# Patient Record
Sex: Female | Born: 1950 | Race: Black or African American | Hispanic: No | Marital: Single | Smoking: Current some day smoker
Health system: Southern US, Community
[De-identification: ages and names within clinical notes are randomized; demographics above are authoritative.]

## PROBLEM LIST (undated history)

## (undated) DIAGNOSIS — Z950 Presence of cardiac pacemaker: Secondary | ICD-10-CM

## (undated) DIAGNOSIS — G971 Other reaction to spinal and lumbar puncture: Secondary | ICD-10-CM

## (undated) DIAGNOSIS — K219 Gastro-esophageal reflux disease without esophagitis: Secondary | ICD-10-CM

## (undated) DIAGNOSIS — R112 Nausea with vomiting, unspecified: Secondary | ICD-10-CM

## (undated) DIAGNOSIS — I2510 Atherosclerotic heart disease of native coronary artery without angina pectoris: Secondary | ICD-10-CM

## (undated) DIAGNOSIS — C50919 Malignant neoplasm of unspecified site of unspecified female breast: Secondary | ICD-10-CM

## (undated) DIAGNOSIS — R32 Unspecified urinary incontinence: Secondary | ICD-10-CM

## (undated) DIAGNOSIS — I482 Chronic atrial fibrillation, unspecified: Secondary | ICD-10-CM

## (undated) DIAGNOSIS — E876 Hypokalemia: Secondary | ICD-10-CM

## (undated) DIAGNOSIS — T884XXA Failed or difficult intubation, initial encounter: Secondary | ICD-10-CM

## (undated) DIAGNOSIS — R51 Headache: Secondary | ICD-10-CM

## (undated) DIAGNOSIS — Z930 Tracheostomy status: Secondary | ICD-10-CM

## (undated) DIAGNOSIS — Z7901 Long term (current) use of anticoagulants: Secondary | ICD-10-CM

## (undated) DIAGNOSIS — I10 Essential (primary) hypertension: Secondary | ICD-10-CM

## (undated) DIAGNOSIS — G8921 Chronic pain due to trauma: Secondary | ICD-10-CM

## (undated) DIAGNOSIS — M352 Behcet's disease: Secondary | ICD-10-CM

## (undated) DIAGNOSIS — I509 Heart failure, unspecified: Secondary | ICD-10-CM

## (undated) DIAGNOSIS — M1990 Unspecified osteoarthritis, unspecified site: Secondary | ICD-10-CM

## (undated) DIAGNOSIS — Q321 Other congenital malformations of trachea: Secondary | ICD-10-CM

## (undated) DIAGNOSIS — Z9889 Other specified postprocedural states: Secondary | ICD-10-CM

## (undated) DIAGNOSIS — Z8719 Personal history of other diseases of the digestive system: Secondary | ICD-10-CM

## (undated) DIAGNOSIS — N189 Chronic kidney disease, unspecified: Secondary | ICD-10-CM

## (undated) DIAGNOSIS — N390 Urinary tract infection, site not specified: Secondary | ICD-10-CM

## (undated) DIAGNOSIS — R519 Headache, unspecified: Secondary | ICD-10-CM

## (undated) DIAGNOSIS — Z8711 Personal history of peptic ulcer disease: Secondary | ICD-10-CM

## (undated) DIAGNOSIS — I639 Cerebral infarction, unspecified: Secondary | ICD-10-CM

## (undated) DIAGNOSIS — I219 Acute myocardial infarction, unspecified: Secondary | ICD-10-CM

## (undated) DIAGNOSIS — I729 Aneurysm of unspecified site: Secondary | ICD-10-CM

## (undated) HISTORY — DX: Hypokalemia: E87.6

## (undated) HISTORY — DX: Other congenital malformations of trachea: Q32.1

## (undated) HISTORY — DX: Essential (primary) hypertension: I10

## (undated) HISTORY — DX: Unspecified urinary incontinence: R32

## (undated) HISTORY — DX: Behcet's disease: M35.2

## (undated) HISTORY — DX: Headache: R51

## (undated) HISTORY — DX: Headache, unspecified: R51.9

## (undated) HISTORY — PX: BLADDER SURGERY: SHX569

## (undated) HISTORY — PX: UVULOPALATOPHARYNGOPLASTY: SHX827

## (undated) HISTORY — DX: Urinary tract infection, site not specified: N39.0

## (undated) HISTORY — DX: Aneurysm of unspecified site: I72.9

## (undated) HISTORY — PX: BACK SURGERY: SHX140

## (undated) HISTORY — DX: Personal history of peptic ulcer disease: Z87.11

## (undated) HISTORY — PX: CHOLECYSTECTOMY: SHX55

## (undated) HISTORY — DX: Tracheostomy status: Z93.0

## (undated) HISTORY — DX: Chronic atrial fibrillation, unspecified: I48.20

## (undated) HISTORY — DX: Chronic pain due to trauma: G89.21

## (undated) HISTORY — PX: ABDOMINAL HYSTERECTOMY: SHX81

## (undated) HISTORY — DX: Atherosclerotic heart disease of native coronary artery without angina pectoris: I25.10

## (undated) HISTORY — DX: Unspecified osteoarthritis, unspecified site: M19.90

## (undated) HISTORY — PX: FRACTURE SURGERY: SHX138

## (undated) HISTORY — DX: Personal history of other diseases of the digestive system: Z87.19

## (undated) HISTORY — DX: Long term (current) use of anticoagulants: Z79.01

---

## 1996-10-08 DIAGNOSIS — I729 Aneurysm of unspecified site: Secondary | ICD-10-CM

## 1996-10-08 HISTORY — DX: Aneurysm of unspecified site: I72.9

## 1998-10-08 HISTORY — PX: TRACHEAL SURGERY: SHX1096

## 2004-07-26 ENCOUNTER — Inpatient Hospital Stay: Payer: Self-pay | Admitting: Internal Medicine

## 2004-08-04 ENCOUNTER — Inpatient Hospital Stay: Payer: Self-pay | Admitting: Internal Medicine

## 2004-08-11 ENCOUNTER — Ambulatory Visit: Payer: Self-pay | Admitting: Internal Medicine

## 2004-09-16 ENCOUNTER — Inpatient Hospital Stay: Payer: Self-pay | Admitting: Internal Medicine

## 2004-09-23 ENCOUNTER — Ambulatory Visit: Payer: Self-pay | Admitting: Internal Medicine

## 2004-10-24 ENCOUNTER — Ambulatory Visit: Payer: Self-pay | Admitting: Internal Medicine

## 2004-12-21 ENCOUNTER — Ambulatory Visit: Payer: Self-pay | Admitting: Internal Medicine

## 2005-09-26 ENCOUNTER — Ambulatory Visit: Payer: Self-pay | Admitting: Neurology

## 2005-10-19 ENCOUNTER — Inpatient Hospital Stay: Payer: Self-pay | Admitting: Internal Medicine

## 2005-10-27 ENCOUNTER — Other Ambulatory Visit: Payer: Self-pay

## 2005-10-29 HISTORY — PX: PACEMAKER INSERTION: SHX728

## 2005-10-30 ENCOUNTER — Other Ambulatory Visit: Payer: Self-pay

## 2005-11-08 ENCOUNTER — Ambulatory Visit: Payer: Self-pay | Admitting: Internal Medicine

## 2005-11-18 ENCOUNTER — Emergency Department: Payer: Self-pay | Admitting: General Practice

## 2006-01-04 ENCOUNTER — Ambulatory Visit: Payer: Self-pay | Admitting: Otolaryngology

## 2006-07-03 ENCOUNTER — Inpatient Hospital Stay: Payer: Self-pay | Admitting: Internal Medicine

## 2008-12-10 ENCOUNTER — Emergency Department: Payer: Self-pay | Admitting: Emergency Medicine

## 2008-12-28 ENCOUNTER — Encounter: Admission: RE | Admit: 2008-12-28 | Discharge: 2008-12-28 | Payer: Self-pay | Admitting: Neurosurgery

## 2009-01-20 ENCOUNTER — Encounter: Admission: RE | Admit: 2009-01-20 | Discharge: 2009-01-20 | Payer: Self-pay | Admitting: Neurosurgery

## 2009-03-14 ENCOUNTER — Other Ambulatory Visit: Payer: Self-pay | Admitting: Internal Medicine

## 2009-03-14 ENCOUNTER — Encounter: Admission: RE | Admit: 2009-03-14 | Discharge: 2009-03-14 | Payer: Self-pay | Admitting: Neurosurgery

## 2009-03-29 ENCOUNTER — Ambulatory Visit: Payer: Self-pay

## 2009-04-16 ENCOUNTER — Inpatient Hospital Stay (HOSPITAL_COMMUNITY): Admission: RE | Admit: 2009-04-16 | Discharge: 2009-04-20 | Payer: Self-pay | Admitting: Neurosurgery

## 2009-05-27 ENCOUNTER — Ambulatory Visit: Payer: Self-pay | Admitting: Neurosurgery

## 2009-07-28 ENCOUNTER — Ambulatory Visit: Payer: Self-pay | Admitting: Neurosurgery

## 2009-10-08 HISTORY — PX: GANGLION CYST EXCISION: SHX1691

## 2009-11-08 ENCOUNTER — Ambulatory Visit: Payer: Self-pay | Admitting: Internal Medicine

## 2009-12-05 ENCOUNTER — Ambulatory Visit: Payer: Self-pay | Admitting: Internal Medicine

## 2009-12-06 ENCOUNTER — Ambulatory Visit: Payer: Self-pay | Admitting: Internal Medicine

## 2010-01-03 ENCOUNTER — Encounter: Admission: RE | Admit: 2010-01-03 | Discharge: 2010-01-03 | Payer: Self-pay | Admitting: Neurosurgery

## 2010-01-06 ENCOUNTER — Ambulatory Visit: Payer: Self-pay | Admitting: Internal Medicine

## 2010-02-05 ENCOUNTER — Ambulatory Visit: Payer: Self-pay | Admitting: Internal Medicine

## 2010-03-02 ENCOUNTER — Ambulatory Visit: Payer: Self-pay | Admitting: Specialist

## 2010-03-09 ENCOUNTER — Other Ambulatory Visit: Payer: Self-pay | Admitting: Internal Medicine

## 2010-03-10 ENCOUNTER — Inpatient Hospital Stay: Payer: Self-pay | Admitting: Internal Medicine

## 2010-04-07 ENCOUNTER — Ambulatory Visit: Payer: Self-pay | Admitting: Internal Medicine

## 2010-04-09 ENCOUNTER — Emergency Department: Payer: Self-pay | Admitting: Emergency Medicine

## 2010-04-24 ENCOUNTER — Ambulatory Visit: Payer: Self-pay | Admitting: Otolaryngology

## 2010-04-28 ENCOUNTER — Ambulatory Visit: Payer: Self-pay | Admitting: Internal Medicine

## 2010-05-08 ENCOUNTER — Ambulatory Visit: Payer: Self-pay | Admitting: Internal Medicine

## 2010-06-08 ENCOUNTER — Ambulatory Visit: Payer: Self-pay | Admitting: Internal Medicine

## 2010-07-08 ENCOUNTER — Ambulatory Visit: Payer: Self-pay | Admitting: Internal Medicine

## 2010-08-08 ENCOUNTER — Ambulatory Visit: Payer: Self-pay | Admitting: Internal Medicine

## 2010-09-07 ENCOUNTER — Ambulatory Visit: Payer: Self-pay | Admitting: Internal Medicine

## 2010-10-08 LAB — HM COLONOSCOPY: HM Colonoscopy: NORMAL

## 2010-11-06 ENCOUNTER — Ambulatory Visit: Payer: Self-pay | Admitting: Internal Medicine

## 2010-11-08 ENCOUNTER — Ambulatory Visit: Payer: Self-pay | Admitting: Internal Medicine

## 2011-01-14 LAB — BASIC METABOLIC PANEL
Calcium: 9.5 mg/dL (ref 8.4–10.5)
Creatinine, Ser: 1.04 mg/dL (ref 0.4–1.2)
GFR calc Af Amer: >60 mL/min (ref >60)
GFR calc non Af Amer: 55 mL/min — ABNORMAL LOW (ref >60)
Glucose, Bld: 131 mg/dL — ABNORMAL HIGH (ref 70–99)
Potassium: 3.3 mEq/L — ABNORMAL LOW (ref 3.5–5.1)
Sodium: 140 mEq/L (ref 135–145)

## 2011-01-14 LAB — POCT I-STAT 4, (NA,K, GLUC, HGB,HCT)
Glucose, Bld: 104 mg/dL — ABNORMAL HIGH (ref 70–99)
Glucose, Bld: 105 mg/dL — ABNORMAL HIGH (ref 70–99)
HCT: 44 % (ref 36.0–46.0)
HCT: 46 % (ref 36.0–46.0)
Potassium: 6.5 mEq/L (ref 3.5–5.1)
Sodium: 140 mEq/L (ref 135–145)

## 2011-01-14 LAB — CBC
HCT: 35.8 % — ABNORMAL LOW (ref 36.0–46.0)
HCT: 38.7 % (ref 36.0–46.0)
HCT: 38.9 % (ref 36.0–46.0)
Hemoglobin: 11.9 g/dL — ABNORMAL LOW (ref 12.0–15.0)
Hemoglobin: 13.1 g/dL (ref 12.0–15.0)
MCHC: 33.9 g/dL (ref 30.0–36.0)
MCV: 89.5 fL (ref 78.0–100.0)
MCV: 89.7 fL (ref 78.0–100.0)
Platelets: 214 10*3/uL (ref 150–400)
RBC: 3.99 MIL/uL (ref 3.87–5.11)
RBC: 4.36 MIL/uL (ref 3.87–5.11)
RDW: 13.8 % (ref 11.5–15.5)
WBC: 5 10*3/uL (ref 4.0–10.5)

## 2011-01-14 LAB — PROTIME-INR: INR: 1.1 (ref 0.00–1.49)

## 2011-01-14 LAB — APTT: aPTT: 29 seconds (ref 24–37)

## 2011-02-20 NOTE — Op Note (Signed)
[name], [name] NO.:  [id number]   MEDICAL RECORD NO.:  [id number]          PATIENT TYPE:  INP   LOCATION:  3036                         FACILITY:  [facility]   PHYSICIAN:  Donalee Citrin, M.D.        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   PROCEDURE:  Anterior cervical diskectomy and fusion at C6-7 using a 7-mm  allograft wedge and 25-mm Venture plate with four 30-mm screws.   SURGEON:  Donalee Citrin, MD   ASSISTANT:  Tia Alert, MD   ANESTHESIA:  General endotracheal.   HISTORY OF PRESENT ILLNESS:  The patient is a 60 year old female who  sustained a fracture at the C6-7 facet complex few months ago.  Fracture  appeared to be healed, but the patient has had a persistent C7  radiculopathy.  Myelography revealed some dorsal deflexion of the nerve  root at C6-7, felt to be consistent with disk material, so the patient  was ultimately recommended anterior cervical diskectomy and fusion and  decompression at C7 nerve root on the right.  Risks and benefits of the  operation were explained to the patient.  The patient understood and  agreed to proceed forward.   The patient was brought to the OR, was induced under general anesthesia,  placed supine, neck flexed in extension with 5 pounds of halter  traction.  The right side of the neck was prepped and draped in the  usual sterile fashion.  Preop x-ray localized the appropriate level. A  curvilinear incision was made just off the midline to the anterior  border of the sternocleidomastoid.  The superficial layer of the  platysma was dissected out and divided longitudinally.  The avascular  plane between the sternocleidomastoid and strap muscles was developed  down to the prevertebral fascia.  Prevertebral fascia was dissected  with  Kittners.  The intraoperative x-ray confirmed the localization of the C5-  6 disk space for visualization, so annulotomy was made when disk space  below this at C6-7.  Large anterior osteophytes were bitten off C6  vertebral body and the disk space was drilled down the posterior annulus  and osteophyte complex.  Then, the operative microscope was draped,  brought into the field, and under illumination the undersurface of the  posterior annulus and spinous longitudinal ligament was identified and  removed in the piecemeal fashion aggressively underbiting both the C6  and C7 vertebral bodies.  At this point, the central canal and thecal  sac was then visualized.  There was noted to be fairly sizeable disk  herniation displacing the right aspect of the thecal sac into the right  C7 neural foramen, so marching out towards that foramen underbiting the  C6 and C7 vertebral bodies in the way of several fragments of disk as  well as some osteophytes coming off the uncinate processes were  underbitten decompressing the C7 nerve root and  the C7 nerve was  skeletonized and flushed with pedicle marching out to the right.  After  adequate decompression of foramen was achieved, attention was taken to  the left side marching across the left side.  The left C7 pedicle was  identified and C7 nerve was compressed and after confirmation of  adequate central decompression the endplates were scraped, a size 7-mm  allograft was then inserted under compression and proximally went to 2  mm deep to anterior vertebral line.  A 25-mm Venture plate was incised,  selected and inserted.  Four 30-mm screws were drilled.  All screws had  excellent purchase and locking mechanism was engaged.  Wound was  copiously irrigated.  Meticulous hemostasis was maintained.  The wound  was  then closed in layers with interrupted Vicryl and the platysma with  running forceps subcuticular.  Benzoin and Strip-Strips were  applied.  The patient went to the recovery room in stable condition.  At the end  of the case, sponge and instrument counts were correct.           ______________________________  Donalee Citrin, M.D.     GC/MEDQ  D:  04/18/2009  T:  04/18/2009  Job:  [id number]

## 2011-05-07 ENCOUNTER — Ambulatory Visit: Payer: Self-pay | Admitting: Internal Medicine

## 2011-05-09 ENCOUNTER — Ambulatory Visit: Payer: Self-pay | Admitting: Internal Medicine

## 2011-09-06 ENCOUNTER — Ambulatory Visit: Payer: Self-pay | Admitting: General Surgery

## 2011-09-13 DIAGNOSIS — Z9889 Other specified postprocedural states: Secondary | ICD-10-CM | POA: Insufficient documentation

## 2011-12-27 ENCOUNTER — Ambulatory Visit: Payer: Self-pay

## 2012-01-10 ENCOUNTER — Other Ambulatory Visit: Payer: Self-pay | Admitting: Orthopedic Surgery

## 2012-01-10 DIAGNOSIS — M25561 Pain in right knee: Secondary | ICD-10-CM

## 2012-01-12 ENCOUNTER — Other Ambulatory Visit: Payer: Self-pay

## 2012-04-25 DIAGNOSIS — R0603 Acute respiratory distress: Secondary | ICD-10-CM | POA: Insufficient documentation

## 2012-04-26 DIAGNOSIS — R58 Hemorrhage, not elsewhere classified: Secondary | ICD-10-CM | POA: Insufficient documentation

## 2012-06-11 DIAGNOSIS — I669 Occlusion and stenosis of unspecified cerebral artery: Secondary | ICD-10-CM | POA: Insufficient documentation

## 2012-06-11 DIAGNOSIS — Z7901 Long term (current) use of anticoagulants: Secondary | ICD-10-CM | POA: Insufficient documentation

## 2012-06-11 DIAGNOSIS — Z5181 Encounter for therapeutic drug level monitoring: Secondary | ICD-10-CM

## 2012-07-07 DIAGNOSIS — R042 Hemoptysis: Secondary | ICD-10-CM | POA: Insufficient documentation

## 2012-08-13 DIAGNOSIS — I639 Cerebral infarction, unspecified: Secondary | ICD-10-CM | POA: Insufficient documentation

## 2012-08-13 DIAGNOSIS — I671 Cerebral aneurysm, nonruptured: Secondary | ICD-10-CM | POA: Insufficient documentation

## 2012-08-13 DIAGNOSIS — Z45018 Encounter for adjustment and management of other part of cardiac pacemaker: Secondary | ICD-10-CM | POA: Insufficient documentation

## 2012-08-22 DIAGNOSIS — Z950 Presence of cardiac pacemaker: Secondary | ICD-10-CM | POA: Insufficient documentation

## 2012-08-22 HISTORY — PX: PACEMAKER PLACEMENT: SHX43

## 2012-09-23 DIAGNOSIS — R001 Bradycardia, unspecified: Secondary | ICD-10-CM | POA: Insufficient documentation

## 2012-09-23 DIAGNOSIS — I639 Cerebral infarction, unspecified: Secondary | ICD-10-CM | POA: Insufficient documentation

## 2012-09-23 DIAGNOSIS — Z930 Tracheostomy status: Secondary | ICD-10-CM | POA: Insufficient documentation

## 2013-09-10 ENCOUNTER — Ambulatory Visit: Payer: Self-pay | Admitting: Internal Medicine

## 2013-10-17 ENCOUNTER — Emergency Department: Payer: Self-pay | Admitting: Emergency Medicine

## 2013-11-25 ENCOUNTER — Ambulatory Visit (INDEPENDENT_AMBULATORY_CARE_PROVIDER_SITE_OTHER): Payer: Medicare Other | Admitting: Internal Medicine

## 2013-11-25 ENCOUNTER — Encounter: Payer: Self-pay | Admitting: Internal Medicine

## 2013-11-25 ENCOUNTER — Other Ambulatory Visit: Payer: Self-pay

## 2013-11-25 VITALS — BP 132/78 | HR 66 | Temp 98.2°F | Ht 68.0 in | Wt 251.0 lb

## 2013-11-25 DIAGNOSIS — M352 Behcet's disease: Secondary | ICD-10-CM | POA: Insufficient documentation

## 2013-11-25 DIAGNOSIS — N390 Urinary tract infection, site not specified: Secondary | ICD-10-CM

## 2013-11-25 DIAGNOSIS — R062 Wheezing: Secondary | ICD-10-CM

## 2013-11-25 DIAGNOSIS — Z7901 Long term (current) use of anticoagulants: Secondary | ICD-10-CM

## 2013-11-25 DIAGNOSIS — R32 Unspecified urinary incontinence: Secondary | ICD-10-CM

## 2013-11-25 DIAGNOSIS — I482 Chronic atrial fibrillation, unspecified: Secondary | ICD-10-CM | POA: Insufficient documentation

## 2013-11-25 DIAGNOSIS — I119 Hypertensive heart disease without heart failure: Secondary | ICD-10-CM

## 2013-11-25 DIAGNOSIS — I10 Essential (primary) hypertension: Secondary | ICD-10-CM

## 2013-11-25 LAB — POCT URINALYSIS DIPSTICK
Bilirubin, UA: NEGATIVE
GLUCOSE UA: NEGATIVE
Ketones, UA: NEGATIVE
Nitrite, UA: POSITIVE
PH UA: 6.5
Spec Grav, UA: 1.02
Urobilinogen, UA: 0.2

## 2013-11-25 MED ORDER — NITROFURANTOIN MONOHYD MACRO 100 MG PO CAPS: 100.0000 mg | ORAL_CAPSULE | Freq: Two times a day (BID) | ORAL | Status: DC

## 2013-11-25 MED ORDER — ALBUTEROL SULFATE HFA 108 (90 BASE) MCG/ACT IN AERS: 2.0000 | INHALATION_SPRAY | Freq: Four times a day (QID) | RESPIRATORY_TRACT | Status: DC | PRN

## 2013-11-25 NOTE — Progress Notes (Signed)
Pre-visit discussion using our clinic review tool. No additional management support is needed unless otherwise documented below in the visit note.  

## 2013-11-25 NOTE — Progress Notes (Signed)
HPI:   Pt presents today to establish care. She is transferring from Dr. Hoy Morn, at [facility] in [place], [place]. Pt has multiple health concerns today. She has trach and is having increased shortness of breath and mucosal expectorant.  She denies chest pain, fever, chills, or other respiratory symptoms. She is also concerned that she may have a UTI. She has has increased urinary frequency, without urgency, pain or burning.   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 Arthritis   . Frequent headaches   . History of stomach ulcers   . Heart disease   . Hypertension   . Urine incontinence   . Urinary tract infection, recurrent   . Behcet's syndrome   . 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   No current facility-administered medications for this visit.    Allergies not on file  No family history on file.  History   Social History  . Marital Status: Widowed    Spouse Name: N/A    Number of Children: N/A  . Years of Education: N/A   Occupational History  . Not on file.   Social History Main Topics  . Smoking status: Not on file  . Smokeless tobacco: Not on file  . Alcohol Use: Not on file  . Drug Use: Not on file  . Sexual Activity: Not on file   Other Topics Concern  . Not on file   Social History Narrative  . No narrative on file    ROS:  Constitutional: Denies fever, malaise, fatigue, headache or abrupt weight changes.  HEENT: Denies eye pain, eye redness,  ear pain, ringing in the ears, wax buildup, runny nose, nasal congestion, bloody nose, or sore throat. Respiratory: Endorses shortness of breath, thick sputum from trach, and dry cough.   Cardiovascular: Denies chest pain, chest tightness, palpitations or swelling in the hands or feet.  Gastrointestinal: Denies abdominal pain, bloating, constipation, diarrhea or blood in the stool.  GU: Denies frequency, urgency, pain with urination, blood in urine, odor or discharge. Musculoskeletal: Denies decrease in range of motion, difficulty with gait, muscle pain or joint pain and swelling.  Skin: Denies redness, rashes, lesions or ulcercations.  Neurological: Denies dizziness, difficulty with memory, difficulty with speech or problems with balance and coordination.   No other specific complaints in a complete review of systems (except as listed in HPI above).  PE: Filed Vitals:   11/25/13 1428  BP: 132/78  Pulse: 66  Temp: 98.2 F (36.8 C)     Ht 5' 8"  (1.727 m)  Wt 251 lb (113.853 kg)  BMI 38.17 kg/m2 Wt Readings from Last 3 Encounters:  11/25/13 251 lb (113.853 kg)    General: Appears her stated age, well developed, well nourished in NAD. HEENT: Head: normal shape and size; Eyes: sclera white, no icterus, conjunctiva pink, PERRLA and EOMs intact; Ears: Tm's gray and intact, normal light reflex; Nose: mucosa pink and moist, septum midline; Throat/Mouth: Teeth present, mucosa pink and moist, no lesions or ulcerations noted.  Neck: Normal  range of motion. Neck supple, trach noted. No massses, lumps or thyromegaly present.  Cardiovascular: Normal rate and rhythm. S1,S2 noted.  No murmur, rubs or gallops noted. No JVD or BLE edema. No carotid bruits noted. Pulmonary/Chest: Normal effort and positive vesicular breath sounds. Expiratory wheezing to bilateral upper lobes. Lower lobes clear bilaterally. Lurline Idol is clear, no obstruction or mucosal drainage noted.  Abdomen: Soft and nontender. Normal  bowel sounds, no bruits noted. No distention or masses noted. Liver, spleen and kidneys non palpable. Musculoskeletal: Normal range of motion. No signs of joint swelling. No difficulty with gait.  Neurological: Alert and oriented. Cranial nerves II-XII intact. Coordination normal. +DTRs bilaterally. Psychiatric: Mood and affect normal. Behavior is normal. Judgment and thought content normal.     BMET    Component Value Date/Time   NA 140 04/18/2009 0744   K 3.5 04/18/2009 0744   CL 104 04/16/2009 1810   CO2 29 04/16/2009 1810   GLUCOSE 105* 04/18/2009 0744   BUN 10 04/16/2009 1810   CREATININE 1.04 04/16/2009 1810   CALCIUM 9.5 04/16/2009 1810   GFRNONAA 55* 04/16/2009 1810   GFRAA  Value: >60        The eGFR has been calculated using the MDRD equation. This calculation has not been validated in all clinical situations. eGFR's persistently <60 mL/min signify possible Chronic Kidney Disease. 04/16/2009 1810    Lipid Panel  No results found for this basename: chol, trig, hdl, cholhdl, vldl, ldlcalc    CBC    Component Value Date/Time   WBC 7.4 04/20/2009 0527   RBC 3.99 04/20/2009 0527   HGB 11.9 DELTA CHECK NOTED REPEATED TO VERIFY* 04/20/2009 0527   HCT 35.8* 04/20/2009 0527   PLT 201 04/20/2009 0527   MCV 89.7 04/20/2009 0527   MCHC 33.2 04/20/2009 0527   RDW 13.8 04/20/2009 0527    Hgb A1C No results found for this basename: HGBA1C     Assessment and Plan:  Arthritis:  Continue current plan  Hypertension:  Controlled, will continue current plan Will check lipid panel today  Heart disease:  Stable on Coumadin Will check PT/INR Needs to establish with Coumadin clinic here, pt has information  Bechets syndrome  Continue current treatment plan Follow up specialist at scheduled  Urinary frequency:  UA positive for blood, leuks, nitrates, and protein Will need to recheck urine at next visit due to large amount of protein in urine Rx for Macrobid   Wheezing/Increased  trach drainage:  Recommended OTC Robitussin to help loosen mucos Rx Albuterol inhaler q4-6 hours shortness of breath or wheezing Keep follow up appt with pulmonologist  Health Maintenance   Will check lipid panel, CBC, CMET, and A1C Need referral for Mammogram Will call with lab results Follow up in 3 months   Sara Mefferd, NP

## 2013-11-25 NOTE — Patient Instructions (Addendum)

## 2013-11-26 ENCOUNTER — Ambulatory Visit: Payer: Self-pay | Admitting: Internal Medicine

## 2013-11-26 ENCOUNTER — Telehealth: Payer: Self-pay

## 2013-11-26 ENCOUNTER — Ambulatory Visit: Payer: Self-pay

## 2013-11-26 ENCOUNTER — Telehealth: Payer: Self-pay | Admitting: Family Medicine

## 2013-11-26 LAB — COMPREHENSIVE METABOLIC PANEL
ALBUMIN: 3.5 g/dL (ref 3.5–5.2)
ALK PHOS: 53 U/L (ref 39–117)
ALT: 19 U/L (ref 0–35)
AST: 25 U/L (ref 0–37)
BILIRUBIN TOTAL: 0.4 mg/dL (ref 0.3–1.2)
BUN: 13 mg/dL (ref 6–23)
CO2: 25 meq/L (ref 19–32)
CREATININE: 1.5 mg/dL — AB (ref 0.4–1.2)
Calcium: 9.3 mg/dL (ref 8.4–10.5)
Chloride: 106 mEq/L (ref 96–112)
GFR: 45.08 mL/min — ABNORMAL LOW (ref >60.00)
GLUCOSE: 71 mg/dL (ref 70–99)
Potassium: 4.4 mEq/L (ref 3.5–5.1)
SODIUM: 138 meq/L (ref 135–145)
TOTAL PROTEIN: 6.9 g/dL (ref 6.0–8.3)

## 2013-11-26 LAB — CBC
HEMATOCRIT: 36.6 % (ref 36.0–46.0)
HEMOGLOBIN: 11.7 g/dL — AB (ref 12.0–15.0)
MCHC: 32 g/dL (ref 30.0–36.0)
MCV: 90.6 fl (ref 78.0–100.0)
PLATELETS: 204 10*3/uL (ref 150.0–400.0)
RBC: 4.04 Mil/uL (ref 3.87–5.11)
RDW: 14.1 % (ref 11.5–14.6)
WBC: 4.2 10*3/uL — AB (ref 4.5–10.5)

## 2013-11-26 LAB — PROTIME-INR
INR: 3.1 ratio — ABNORMAL HIGH (ref 0.8–1.0)
Prothrombin Time: 32.3 s — ABNORMAL HIGH (ref 10.2–12.4)

## 2013-11-26 NOTE — Telephone Encounter (Signed)
Noted, thank you

## 2013-11-26 NOTE — Telephone Encounter (Signed)
Spoke with pt about missed appt.  She said that INR was drawn when she was here yesterday.  Results are not back yet.  We discussed having [facility] send her results to [place] instead of her old doctor's office.    I spoke with a representative at [facility] to change info to have results faxed to our office.  They are sending a form that needs to be filled out by Sara Silversmith, NP to continue the process and have results sent here.

## 2013-11-26 NOTE — Telephone Encounter (Signed)
Noted thank you

## 2013-11-26 NOTE — Telephone Encounter (Signed)
Pt was seen 11/25/13 and pt noticed on med list that instructions for Labetalol were not correct and pt requested that med list changed to Labetalol HCL 300 mg with instructions take 300 mg by mouth three times a day. Med list was updated but pt wanted Webb Silversmith NP to be aware.

## 2013-11-27 ENCOUNTER — Other Ambulatory Visit: Payer: Self-pay | Admitting: Internal Medicine

## 2013-11-27 ENCOUNTER — Ambulatory Visit (INDEPENDENT_AMBULATORY_CARE_PROVIDER_SITE_OTHER): Payer: Medicare Other | Admitting: Family Medicine

## 2013-11-27 DIAGNOSIS — R7989 Other specified abnormal findings of blood chemistry: Secondary | ICD-10-CM

## 2013-11-27 DIAGNOSIS — Z5181 Encounter for therapeutic drug level monitoring: Secondary | ICD-10-CM

## 2013-11-27 DIAGNOSIS — Z7901 Long term (current) use of anticoagulants: Secondary | ICD-10-CM

## 2013-11-27 DIAGNOSIS — R809 Proteinuria, unspecified: Secondary | ICD-10-CM

## 2013-11-27 LAB — POCT INR: INR: 3.1

## 2013-11-30 ENCOUNTER — Other Ambulatory Visit (INDEPENDENT_AMBULATORY_CARE_PROVIDER_SITE_OTHER): Payer: Medicare Other

## 2013-11-30 DIAGNOSIS — R7989 Other specified abnormal findings of blood chemistry: Secondary | ICD-10-CM

## 2013-11-30 DIAGNOSIS — R799 Abnormal finding of blood chemistry, unspecified: Secondary | ICD-10-CM

## 2013-11-30 DIAGNOSIS — R809 Proteinuria, unspecified: Secondary | ICD-10-CM

## 2013-11-30 LAB — URINALYSIS, ROUTINE W REFLEX MICROSCOPIC
Ketones, ur: NEGATIVE
Leukocytes, UA: NEGATIVE
Nitrite: NEGATIVE
Total Protein, Urine: 100 — AB
URINE GLUCOSE: NEGATIVE
Urobilinogen, UA: 0.2 (ref 0.0–1.0)
pH: 6 (ref 5.0–8.0)

## 2013-11-30 LAB — BASIC METABOLIC PANEL
BUN: 15 mg/dL (ref 6–23)
CHLORIDE: 106 meq/L (ref 96–112)
CO2: 26 mEq/L (ref 19–32)
Calcium: 9.5 mg/dL (ref 8.4–10.5)
Creatinine, Ser: 1.4 mg/dL — ABNORMAL HIGH (ref 0.4–1.2)
GFR: 49.19 mL/min — AB (ref >60.00)
Glucose, Bld: 100 mg/dL — ABNORMAL HIGH (ref 70–99)
POTASSIUM: 3.9 meq/L (ref 3.5–5.1)
Sodium: 139 mEq/L (ref 135–145)

## 2013-11-30 LAB — HEMOGLOBIN A1C: Hgb A1c MFr Bld: 5.9 % (ref 4.6–6.5)

## 2013-12-09 ENCOUNTER — Telehealth: Payer: Self-pay | Admitting: Internal Medicine

## 2013-12-09 NOTE — Telephone Encounter (Signed)
I told her at her visit, that she cannot establish with 2 providers in the same office. If she wants to transfer to Dr. Deborra Medina, that is fine with me. I just don't want people to establish with me so that they can get "in" with the office just to transfer care to one of the MD's here(without waiting for the next available new pt appt). It is too much work on my part to request and go through their records when they don't want to see me in the first place.

## 2013-12-09 NOTE — Telephone Encounter (Signed)
Rollene Fare made me aware last week of this being against her wishes.  The front office will no longer be scheduling new patients to see her and then allow them to transfer, however some were already scheduled that way.  I will talk to Rollene Fare to be sure she understands.

## 2013-12-09 NOTE — Telephone Encounter (Signed)
Pt says she didn't realize that establishing w/[name] she would be the PCP.  She thought [name] would still be her PCP. She's upset b/c an apptmt w/[name] was canceled (it was a new patient apptmt that was cancelled b/c she est w/[name]).  Now she's wanting the xfer her care from [facility] to [name] as her PCP. Is this ok with both of you? Pt is experiencing pain under her right breast but wants to wait to schedule an appmt until she gets this "straight". Thank you.

## 2013-12-09 NOTE — Telephone Encounter (Signed)
Pt has a question about her Pt/Inr and wanted to speak with you.

## 2013-12-09 NOTE — Telephone Encounter (Signed)
I agree with Rollene Fare that it is not fair to her to see patients that will establish with one of [place].  I would suggest that [place] and front office be made aware of Regina's wishes.

## 2013-12-10 ENCOUNTER — Ambulatory Visit (INDEPENDENT_AMBULATORY_CARE_PROVIDER_SITE_OTHER): Payer: Medicare Other | Admitting: Family Medicine

## 2013-12-10 DIAGNOSIS — Z5181 Encounter for therapeutic drug level monitoring: Secondary | ICD-10-CM

## 2013-12-10 LAB — POCT INR: INR: 6

## 2013-12-10 NOTE — Telephone Encounter (Signed)
Spoke with pt

## 2013-12-10 NOTE — Telephone Encounter (Signed)
Pt scheduled for 12/14/2013

## 2013-12-10 NOTE — Telephone Encounter (Signed)
Dr. Deborra Medina verbally agreed to accept pt's xfer. Called and left mssg for pt to c/b.

## 2013-12-14 ENCOUNTER — Ambulatory Visit (INDEPENDENT_AMBULATORY_CARE_PROVIDER_SITE_OTHER): Payer: Medicare Other | Admitting: Family Medicine

## 2013-12-14 ENCOUNTER — Ambulatory Visit (INDEPENDENT_AMBULATORY_CARE_PROVIDER_SITE_OTHER)
Admission: RE | Admit: 2013-12-14 | Discharge: 2013-12-14 | Disposition: A | Discharge status: Home / Self Care | Payer: Medicare Other | Source: Ambulatory Visit | Attending: Family Medicine | Admitting: Family Medicine

## 2013-12-14 ENCOUNTER — Encounter: Payer: Self-pay | Admitting: Family Medicine

## 2013-12-14 VITALS — BP 122/74 | HR 75 | Temp 97.3°F | Ht 66.5 in | Wt 253.2 lb

## 2013-12-14 DIAGNOSIS — R079 Chest pain, unspecified: Secondary | ICD-10-CM

## 2013-12-14 DIAGNOSIS — Z5181 Encounter for therapeutic drug level monitoring: Secondary | ICD-10-CM

## 2013-12-14 DIAGNOSIS — R0789 Other chest pain: Secondary | ICD-10-CM

## 2013-12-14 LAB — POCT INR: INR: 1.6

## 2013-12-14 NOTE — Patient Instructions (Signed)
Nice to meet you. I will call you with your xray results.
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
## 2013-12-14 NOTE — Progress Notes (Signed)
Pre visit review using our clinic review tool, if applicable. No additional management support is needed unless otherwise documented below in the visit note. 
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
## 2013-12-16 ENCOUNTER — Ambulatory Visit (INDEPENDENT_AMBULATORY_CARE_PROVIDER_SITE_OTHER): Payer: Medicare Other | Admitting: Family Medicine

## 2013-12-16 DIAGNOSIS — Z5181 Encounter for therapeutic drug level monitoring: Secondary | ICD-10-CM

## 2013-12-16 LAB — POCT INR: INR: 2.1

## 2013-12-17 ENCOUNTER — Telehealth: Payer: Self-pay

## 2013-12-17 MED ORDER — GABAPENTIN 100 MG PO CAPS: ORAL_CAPSULE | ORAL | Status: DC

## 2013-12-17 NOTE — Telephone Encounter (Signed)
Spoke to pt and informed her Rx has been sent to requested pharmacy 

## 2013-12-17 NOTE — Telephone Encounter (Signed)
Yes rx for Gabapentin sent to [facility].

## 2013-12-17 NOTE — Telephone Encounter (Signed)
Pt was seen 12/14/13 and baclofen is not helping right sided chest pain. Pain level now is 9. Pt said Dr Deborra Medina had discussed starting pt on gabapentin and pt wants to know if can start gabapentin. [facility]. Pt request cb.

## 2013-12-22 ENCOUNTER — Encounter: Payer: Self-pay | Admitting: Interventional Cardiology

## 2013-12-25 ENCOUNTER — Ambulatory Visit (INDEPENDENT_AMBULATORY_CARE_PROVIDER_SITE_OTHER): Payer: Medicare Other | Admitting: Family Medicine

## 2013-12-25 DIAGNOSIS — Z5181 Encounter for therapeutic drug level monitoring: Secondary | ICD-10-CM

## 2013-12-25 LAB — POCT INR: INR: 3.2

## 2013-12-28 DIAGNOSIS — J398 Other specified diseases of upper respiratory tract: Secondary | ICD-10-CM | POA: Insufficient documentation

## 2013-12-31 ENCOUNTER — Ambulatory Visit: Payer: Self-pay | Admitting: Family Medicine

## 2014-01-04 ENCOUNTER — Ambulatory Visit (INDEPENDENT_AMBULATORY_CARE_PROVIDER_SITE_OTHER): Payer: Medicare Other | Admitting: Interventional Cardiology

## 2014-01-04 ENCOUNTER — Encounter: Payer: Self-pay | Admitting: Interventional Cardiology

## 2014-01-04 ENCOUNTER — Ambulatory Visit (INDEPENDENT_AMBULATORY_CARE_PROVIDER_SITE_OTHER): Payer: Medicare Other | Admitting: *Deleted

## 2014-01-04 ENCOUNTER — Ambulatory Visit: Payer: Medicare Other | Admitting: Family Medicine

## 2014-01-04 ENCOUNTER — Encounter: Payer: Self-pay | Admitting: *Deleted

## 2014-01-04 VITALS — BP 132/90 | HR 71 | Ht 66.6 in | Wt 253.0 lb

## 2014-01-04 DIAGNOSIS — Z950 Presence of cardiac pacemaker: Secondary | ICD-10-CM | POA: Insufficient documentation

## 2014-01-04 DIAGNOSIS — H209 Unspecified iridocyclitis: Secondary | ICD-10-CM | POA: Insufficient documentation

## 2014-01-04 DIAGNOSIS — Z8679 Personal history of other diseases of the circulatory system: Secondary | ICD-10-CM

## 2014-01-04 DIAGNOSIS — R0789 Other chest pain: Secondary | ICD-10-CM

## 2014-01-04 DIAGNOSIS — Z430 Encounter for attention to tracheostomy: Secondary | ICD-10-CM | POA: Insufficient documentation

## 2014-01-04 DIAGNOSIS — H409 Unspecified glaucoma: Secondary | ICD-10-CM | POA: Insufficient documentation

## 2014-01-04 DIAGNOSIS — I10 Essential (primary) hypertension: Secondary | ICD-10-CM | POA: Insufficient documentation

## 2014-01-04 DIAGNOSIS — I498 Other specified cardiac arrhythmias: Secondary | ICD-10-CM

## 2014-01-04 DIAGNOSIS — R079 Chest pain, unspecified: Secondary | ICD-10-CM

## 2014-01-04 DIAGNOSIS — R001 Bradycardia, unspecified: Secondary | ICD-10-CM

## 2014-01-04 DIAGNOSIS — M79609 Pain in unspecified limb: Secondary | ICD-10-CM

## 2014-01-04 DIAGNOSIS — R739 Hyperglycemia, unspecified: Secondary | ICD-10-CM | POA: Insufficient documentation

## 2014-01-04 DIAGNOSIS — I739 Peripheral vascular disease, unspecified: Secondary | ICD-10-CM

## 2014-01-04 LAB — MDC_IDC_ENUM_SESS_TYPE_INCLINIC
Brady Statistic RA Percent Paced: 15 %
Brady Statistic RV Percent Paced: 0 %
Implantable Pulse Generator Model: 2210
Implantable Pulse Generator Serial Number: 7427529
Lead Channel Impedance Value: 375 Ohm
Lead Channel Pacing Threshold Amplitude: 0.75 V
Lead Channel Pacing Threshold Amplitude: 1.25 V
Lead Channel Sensing Intrinsic Amplitude: 2.9 mV
Lead Channel Sensing Intrinsic Amplitude: 8.6 mV
Lead Channel Setting Pacing Amplitude: 2 V
Lead Channel Setting Pacing Pulse Width: 0.4 ms
MDC IDC MSMT BATTERY REMAINING LONGEVITY: 104.4 mo
MDC IDC MSMT BATTERY VOLTAGE: 2.95 V
MDC IDC MSMT LEADCHNL RA IMPEDANCE VALUE: 387.5 Ohm
MDC IDC MSMT LEADCHNL RA PACING THRESHOLD PULSEWIDTH: 0.4 ms
MDC IDC MSMT LEADCHNL RV PACING THRESHOLD PULSEWIDTH: 0.4 ms
MDC IDC SESS DTM: 20150330194737
MDC IDC SET LEADCHNL RV PACING AMPLITUDE: 2.5 V
MDC IDC SET LEADCHNL RV SENSING SENSITIVITY: 2 mV

## 2014-01-04 NOTE — Patient Instructions (Addendum)
Your physician recommends that you schedule a follow-up appointment in:  3 months with Dr. Lovena Le to establish care for your device.   Your physician has requested that you have an echocardiogram. Echocardiography is a painless test that uses sound waves to create images of your heart. It provides your doctor with information about the size and shape of your heart and how well your heart's chambers and valves are working. This procedure takes approximately one hour. There are no restrictions for this procedure.  Your physician has requested that you have a lower extremity arterial duplex. During this test an ultrasound is used to evaluate arterial blood flow in the legs. Allow one hour for this exam. There are no restrictions or special instructions.

## 2014-01-04 NOTE — Progress Notes (Signed)
Patient ID: [name], female   DOB: [birth year], 63 y.o.   MRN: [id number]     Patient ID: [name] MRN: [id number] DOB/AGE: [birth year] 63 y.o.   Referring Physician Dr. Aleda Grana   Reason for Consultation: right sided chest/back pain  HPI: 63 y/o with a tracheostomy due to [place] in 2010.  She has had a pacer placed in 2010.  We interrogated the pacer and found that she uses it 15%.  She was just released frin the hospital to clear out the airway.  She was told in the past she had a leaky heart valve.  Her father died with heart problems.  Both parents had open heart surgery.  Her sister had a pacer.  Brothers have had MI.  She was told that she had a prior MI.    The pain is in her right side is very sharp and made worse by reaching up.  Deep breathing is difficult while she has the pain.  She has leg pains, cramps and leg swelling.  Swelling is worse with walking a long distance. She had a stress test on her heart in the past few years and she was unable to walk the treadmill.  She later had a chmical stress test but is unsure of the results.     Current Outpatient Prescriptions  Medication Sig Dispense Refill  . cholecalciferol (VITAMIN D) 1000 UNITS tablet Take 1,000 Units by mouth daily.      Marland Kitchen gabapentin (NEURONTIN) 100 MG capsule 1 tablet by mouth nightly, may increase to twice daily if well tolerated  90 capsule  3  . HYDROcodone-acetaminophen (NORCO/VICODIN) 5-325 MG per tablet Take 1-2 tablets by mouth as needed.      . labetalol (NORMODYNE) 300 MG tablet Take 300 mg by mouth 3 (three) times daily.       Marland Kitchen latanoprost (XALATAN) 0.005 % ophthalmic solution as directed.      . ondansetron (ZOFRAN) 4 MG tablet as needed.      Marland Kitchen oxyCODONE-acetaminophen (PERCOCET) 7.5-325 MG per tablet Take by mouth as needed.      . pantoprazole (PROTONIX) 40 MG tablet Take 40 mg by mouth daily.      . potassium chloride (K-DUR) 10 MEQ tablet Take 10 mEq by mouth 3 (three) times daily.        Marland Kitchen warfarin (COUMADIN) 1 MG tablet Take 1 mg by mouth daily. 3 tablets at bedtime      . warfarin (COUMADIN) 4 MG tablet Take 4 mg by mouth daily. 2 tablets at bedtime       No current facility-administered medications for this visit.   Past Medical History  Diagnosis Date  . Arthritis   . Frequent headaches   . History of stomach ulcers   . Heart disease   . Hypertension   . Urine incontinence   . Urinary tract infection, recurrent   . Behcet's syndrome   . 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 Heart disease Mother   . Hypertension Mother   . Heart disease Father   . Hypertension Father     History   Social History  . Marital Status: Widowed    Spouse Name: N/A    Number of Children: N/A  . Years of Education: N/A   Occupational History  . Not on file.   Social History Main Topics  . Smoking status: Former Audiological scientist  date: 07/17/2009  . Smokeless tobacco: Not on file  . Alcohol Use: No  . Drug Use: No  . Sexual Activity: No   Other Topics Concern  . Not on file   Social History Narrative  . No narrative on file    Past Surgical History  Procedure Laterality Date  . Abdominal hysterectomy    . Pacemaker insertion  10/29/2005  . Pacemaker placement  08/22/2012  . Bladder surgery      hitch  . Tracheal surgery  2000      (Not in a hospital admission)  Review of systems complete and found to be negative unless listed above .  No nausea, vomiting.  No fever chills, No focal weakness,  No palpitations.  Physical Exam: Filed Vitals:   01/04/14 1627  BP: 132/90  Pulse: 71    Weight: 253 lb (114.76 kg)  Physical exam:  [place]/AT, trach EOMI No JVD, No carotid bruit RRR S1S2  No wheezing Soft. NT, nondistended No edema. No focal motor or sensory deficits Normal affect  Labs:   Lab Results  Component Value Date   WBC 4.2* 11/25/2013   HGB 11.7* 11/25/2013   HCT 36.6 11/25/2013   MCV 90.6 11/25/2013   PLT  204.0 11/25/2013   No results found for this basename: NA, K, CL, CO2, BUN, CREATININE, CALCIUM, LABALBU, PROT, BILITOT, ALKPHOS, ALT, AST, GLUCOSE,  in the last 168 hours No results found for this basename: CKTOTAL, CKMB, CKMBINDEX, TROPONINI    No results found for this basename: CHOL   No results found for this basename: HDL   No results found for this basename: LDLCALC   No results found for this basename: TRIG   No results found for this basename: CHOLHDL   No results found for this basename: LDLDIRECT      Radiology EKG: Normal sinus rhythm, inferior and lateral T wave inversion  ASSESSMENT AND PLAN:  Chest discomfort: Atypical. It is right-sided and nonexertional. We'll find her old stress test result. She does report having a leaky valve the past. We'll check echocardiogram to evaluate LV function and valvular function.  Pacer check done- pacer is functioning normally. Will schedule her for routine pacer checks in our office.  We'll have to get old records from her prior cardiologist to verify why she is taking warfarin.  I believe it is for prior CVA. Signed:   Mina Marble, MD, [facility] 01/04/2014, 5:01 PM

## 2014-01-05 NOTE — Progress Notes (Signed)
Pacemaker check in clinic. Normal device function. Thresholds, sensing, impedances consistent with previous measurements. Device programmed to maximize longevity. 1 mode switch lasting 4 seconds. No high ventricular rates noted. Device programmed at appropriate safety margins. Histogram distribution appropriate for patient activity level. Device programmed to optimize intrinsic conduction. Estimated longevity 6.7 to 8.7 years. Patient enrolled in remote follow-up and will be transferred from [place] in 3 mths w/GT.

## 2014-01-06 ENCOUNTER — Ambulatory Visit (INDEPENDENT_AMBULATORY_CARE_PROVIDER_SITE_OTHER): Payer: Medicare Other | Admitting: Family Medicine

## 2014-01-06 DIAGNOSIS — Z5181 Encounter for therapeutic drug level monitoring: Secondary | ICD-10-CM

## 2014-01-06 LAB — POCT INR: INR: 2.7

## 2014-01-12 ENCOUNTER — Other Ambulatory Visit (HOSPITAL_COMMUNITY): Payer: Self-pay | Admitting: *Deleted

## 2014-01-12 ENCOUNTER — Ambulatory Visit: Payer: Medicare Other | Admitting: Family Medicine

## 2014-01-12 DIAGNOSIS — R079 Chest pain, unspecified: Secondary | ICD-10-CM

## 2014-01-13 ENCOUNTER — Other Ambulatory Visit (HOSPITAL_COMMUNITY): Payer: Self-pay | Admitting: *Deleted

## 2014-01-13 DIAGNOSIS — I70219 Atherosclerosis of native arteries of extremities with intermittent claudication, unspecified extremity: Secondary | ICD-10-CM

## 2014-01-14 ENCOUNTER — Ambulatory Visit (INDEPENDENT_AMBULATORY_CARE_PROVIDER_SITE_OTHER): Payer: Medicare Other | Admitting: Family Medicine

## 2014-01-14 DIAGNOSIS — Z5181 Encounter for therapeutic drug level monitoring: Secondary | ICD-10-CM

## 2014-01-14 LAB — POCT INR: INR: 3.8

## 2014-01-19 ENCOUNTER — Other Ambulatory Visit: Payer: Medicare Other

## 2014-01-19 ENCOUNTER — Encounter: Payer: Self-pay | Admitting: Family Medicine

## 2014-01-19 ENCOUNTER — Other Ambulatory Visit (HOSPITAL_COMMUNITY): Payer: Medicare Other

## 2014-01-19 ENCOUNTER — Ambulatory Visit (INDEPENDENT_AMBULATORY_CARE_PROVIDER_SITE_OTHER): Payer: Medicare Other | Admitting: Family Medicine

## 2014-01-19 ENCOUNTER — Encounter (HOSPITAL_COMMUNITY): Payer: Medicare Other

## 2014-01-19 VITALS — BP 136/78 | HR 74 | Temp 98.0°F | Wt 251.0 lb

## 2014-01-19 DIAGNOSIS — M25569 Pain in unspecified knee: Secondary | ICD-10-CM | POA: Insufficient documentation

## 2014-01-19 DIAGNOSIS — I10 Essential (primary) hypertension: Secondary | ICD-10-CM

## 2014-01-19 NOTE — Patient Instructions (Addendum)
Good to see you. Go ahead and start exercising.  Let me know if I can do anything different to help you get a personal trainer.
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
## 2014-01-19 NOTE — Assessment & Plan Note (Signed)
Recently titrated up gabapentin to three times daily. Await results from Dr. Irish Lack.

## 2014-01-19 NOTE — Progress Notes (Signed)
Pre visit review using our clinic review tool, if applicable. No additional management support is needed unless otherwise documented below in the visit note. 

## 2014-01-19 NOTE — Assessment & Plan Note (Signed)
Well controlled today. Given rx to take to her gym to see if insurance would cover trainer for her. She will let me know.

## 2014-01-20 ENCOUNTER — Ambulatory Visit (INDEPENDENT_AMBULATORY_CARE_PROVIDER_SITE_OTHER): Payer: Medicare Other | Admitting: Family Medicine

## 2014-01-20 DIAGNOSIS — Z5181 Encounter for therapeutic drug level monitoring: Secondary | ICD-10-CM

## 2014-01-20 LAB — POCT INR: INR: 3.8

## 2014-01-27 ENCOUNTER — Ambulatory Visit (INDEPENDENT_AMBULATORY_CARE_PROVIDER_SITE_OTHER): Payer: Medicare Other | Admitting: General Practice

## 2014-01-27 ENCOUNTER — Telehealth: Payer: Self-pay | Admitting: Family Medicine

## 2014-01-27 ENCOUNTER — Telehealth: Payer: Self-pay | Admitting: General Practice

## 2014-01-27 DIAGNOSIS — Z5181 Encounter for therapeutic drug level monitoring: Secondary | ICD-10-CM

## 2014-01-27 LAB — POCT INR: INR: 3

## 2014-01-27 NOTE — Telephone Encounter (Signed)
LMOVM

## 2014-01-27 NOTE — Progress Notes (Signed)
Pre visit review using our clinic review tool, if applicable. No additional management support is needed unless otherwise documented below in the visit note. 

## 2014-01-27 NOTE — Telephone Encounter (Signed)
Patient had her INR done today by [facility].  Patient's INR is 3.0.

## 2014-01-28 ENCOUNTER — Encounter: Payer: Self-pay | Admitting: Internal Medicine

## 2014-01-28 ENCOUNTER — Telehealth: Payer: Self-pay | Admitting: General Practice

## 2014-01-28 ENCOUNTER — Other Ambulatory Visit: Payer: Medicare Other

## 2014-01-28 NOTE — Telephone Encounter (Signed)
Coumadin dosing instructions given to patient.  Pt verbalized understanding.

## 2014-02-03 ENCOUNTER — Ambulatory Visit (INDEPENDENT_AMBULATORY_CARE_PROVIDER_SITE_OTHER): Payer: Medicare Other | Admitting: Family Medicine

## 2014-02-03 DIAGNOSIS — Z5181 Encounter for therapeutic drug level monitoring: Secondary | ICD-10-CM

## 2014-02-03 LAB — POCT INR: INR: 2.7

## 2014-02-11 ENCOUNTER — Ambulatory Visit (INDEPENDENT_AMBULATORY_CARE_PROVIDER_SITE_OTHER): Payer: Medicare Other | Admitting: Family Medicine

## 2014-02-11 DIAGNOSIS — Z5181 Encounter for therapeutic drug level monitoring: Secondary | ICD-10-CM

## 2014-02-11 LAB — POCT INR: INR: 3.2

## 2014-02-18 ENCOUNTER — Ambulatory Visit (INDEPENDENT_AMBULATORY_CARE_PROVIDER_SITE_OTHER): Payer: Medicare Other | Admitting: Family Medicine

## 2014-02-18 DIAGNOSIS — Z5181 Encounter for therapeutic drug level monitoring: Secondary | ICD-10-CM

## 2014-02-18 LAB — POCT INR: INR: 1.8

## 2014-02-26 ENCOUNTER — Ambulatory Visit (INDEPENDENT_AMBULATORY_CARE_PROVIDER_SITE_OTHER): Payer: Medicare Other | Admitting: Family Medicine

## 2014-02-26 DIAGNOSIS — Z5181 Encounter for therapeutic drug level monitoring: Secondary | ICD-10-CM

## 2014-02-26 LAB — POCT INR: INR: 2.8

## 2014-03-03 ENCOUNTER — Encounter: Payer: Self-pay | Admitting: Internal Medicine

## 2014-03-04 ENCOUNTER — Ambulatory Visit (INDEPENDENT_AMBULATORY_CARE_PROVIDER_SITE_OTHER): Payer: Medicare Other | Admitting: Family Medicine

## 2014-03-04 DIAGNOSIS — Z5181 Encounter for therapeutic drug level monitoring: Secondary | ICD-10-CM

## 2014-03-04 LAB — POCT INR: INR: 3.3

## 2014-03-10 ENCOUNTER — Ambulatory Visit (INDEPENDENT_AMBULATORY_CARE_PROVIDER_SITE_OTHER): Payer: Medicare Other | Admitting: Family Medicine

## 2014-03-10 DIAGNOSIS — Z5181 Encounter for therapeutic drug level monitoring: Secondary | ICD-10-CM

## 2014-03-10 LAB — POCT INR: INR: 2.6

## 2014-03-11 ENCOUNTER — Other Ambulatory Visit: Payer: Self-pay | Admitting: Family Medicine

## 2014-03-17 ENCOUNTER — Ambulatory Visit (INDEPENDENT_AMBULATORY_CARE_PROVIDER_SITE_OTHER): Payer: Medicare Other | Admitting: Family Medicine

## 2014-03-17 DIAGNOSIS — Z5181 Encounter for therapeutic drug level monitoring: Secondary | ICD-10-CM

## 2014-03-17 LAB — POCT INR: INR: 2.4

## 2014-03-25 ENCOUNTER — Other Ambulatory Visit: Payer: Self-pay

## 2014-03-25 ENCOUNTER — Other Ambulatory Visit (INDEPENDENT_AMBULATORY_CARE_PROVIDER_SITE_OTHER): Payer: Medicare Other

## 2014-03-25 ENCOUNTER — Encounter (INDEPENDENT_AMBULATORY_CARE_PROVIDER_SITE_OTHER): Payer: Medicare Other

## 2014-03-25 DIAGNOSIS — I70219 Atherosclerosis of native arteries of extremities with intermittent claudication, unspecified extremity: Secondary | ICD-10-CM

## 2014-03-25 DIAGNOSIS — I359 Nonrheumatic aortic valve disorder, unspecified: Secondary | ICD-10-CM

## 2014-03-25 DIAGNOSIS — R079 Chest pain, unspecified: Secondary | ICD-10-CM

## 2014-03-26 ENCOUNTER — Encounter: Payer: Self-pay | Admitting: Family Medicine

## 2014-03-26 LAB — PROTIME-INR

## 2014-03-30 ENCOUNTER — Other Ambulatory Visit: Payer: Self-pay | Admitting: Family Medicine

## 2014-04-01 ENCOUNTER — Encounter: Payer: Medicare Other | Admitting: Internal Medicine

## 2014-04-02 ENCOUNTER — Ambulatory Visit (INDEPENDENT_AMBULATORY_CARE_PROVIDER_SITE_OTHER): Payer: Medicare Other | Admitting: Family Medicine

## 2014-04-02 ENCOUNTER — Other Ambulatory Visit: Payer: Self-pay | Admitting: Family Medicine

## 2014-04-02 DIAGNOSIS — Z5181 Encounter for therapeutic drug level monitoring: Secondary | ICD-10-CM

## 2014-04-02 LAB — POCT INR: INR: 2.6

## 2014-04-07 ENCOUNTER — Ambulatory Visit (INDEPENDENT_AMBULATORY_CARE_PROVIDER_SITE_OTHER): Payer: Medicare Other | Admitting: Family Medicine

## 2014-04-07 DIAGNOSIS — Z5181 Encounter for therapeutic drug level monitoring: Secondary | ICD-10-CM

## 2014-04-07 LAB — POCT INR: INR: 3.3

## 2014-04-08 ENCOUNTER — Telehealth: Payer: Self-pay | Admitting: Family Medicine

## 2014-04-08 ENCOUNTER — Ambulatory Visit: Payer: Medicare Other | Admitting: General Practice

## 2014-04-08 DIAGNOSIS — Z5181 Encounter for therapeutic drug level monitoring: Secondary | ICD-10-CM

## 2014-04-08 NOTE — Progress Notes (Signed)
Pre visit review using our clinic review tool, if applicable. No additional management support is needed unless otherwise documented below in the visit note. 

## 2014-04-08 NOTE — Telephone Encounter (Signed)
[facility]-PT/INR Self Test [facility] faxed INR results.  INR -3.3-date of test 04/07/14.

## 2014-04-16 ENCOUNTER — Ambulatory Visit (INDEPENDENT_AMBULATORY_CARE_PROVIDER_SITE_OTHER): Payer: Medicare Other | Admitting: Family Medicine

## 2014-04-16 DIAGNOSIS — Z5181 Encounter for therapeutic drug level monitoring: Secondary | ICD-10-CM

## 2014-04-16 LAB — POCT INR: INR: 2.7

## 2014-04-22 ENCOUNTER — Ambulatory Visit (INDEPENDENT_AMBULATORY_CARE_PROVIDER_SITE_OTHER): Payer: Medicare Other | Admitting: Family Medicine

## 2014-04-22 DIAGNOSIS — Z5181 Encounter for therapeutic drug level monitoring: Secondary | ICD-10-CM

## 2014-04-22 LAB — POCT INR: INR: 3.2

## 2014-05-03 ENCOUNTER — Ambulatory Visit: Payer: Medicare Other | Admitting: Family Medicine

## 2014-05-03 DIAGNOSIS — Z5181 Encounter for therapeutic drug level monitoring: Secondary | ICD-10-CM

## 2014-05-03 LAB — POCT INR: INR: 2.4

## 2014-05-07 ENCOUNTER — Ambulatory Visit (INDEPENDENT_AMBULATORY_CARE_PROVIDER_SITE_OTHER): Payer: Medicare Other | Admitting: Family Medicine

## 2014-05-07 DIAGNOSIS — Z5181 Encounter for therapeutic drug level monitoring: Secondary | ICD-10-CM

## 2014-05-07 LAB — POCT INR: INR: 2.8

## 2014-05-14 ENCOUNTER — Ambulatory Visit (INDEPENDENT_AMBULATORY_CARE_PROVIDER_SITE_OTHER): Payer: Medicare Other | Admitting: Family Medicine

## 2014-05-14 DIAGNOSIS — Z5181 Encounter for therapeutic drug level monitoring: Secondary | ICD-10-CM

## 2014-05-14 LAB — POCT INR: INR: 2.5

## 2014-05-24 ENCOUNTER — Ambulatory Visit (INDEPENDENT_AMBULATORY_CARE_PROVIDER_SITE_OTHER): Payer: Medicare Other | Admitting: Family Medicine

## 2014-05-24 DIAGNOSIS — Z5181 Encounter for therapeutic drug level monitoring: Secondary | ICD-10-CM

## 2014-05-24 LAB — POCT INR: INR: 3

## 2014-05-28 ENCOUNTER — Ambulatory Visit (INDEPENDENT_AMBULATORY_CARE_PROVIDER_SITE_OTHER): Payer: Medicare Other | Admitting: Family Medicine

## 2014-05-28 DIAGNOSIS — Z5181 Encounter for therapeutic drug level monitoring: Secondary | ICD-10-CM

## 2014-05-28 LAB — POCT INR: INR: 2.8

## 2014-06-10 ENCOUNTER — Ambulatory Visit (INDEPENDENT_AMBULATORY_CARE_PROVIDER_SITE_OTHER): Payer: Medicare Other | Admitting: Family Medicine

## 2014-06-10 DIAGNOSIS — Z5181 Encounter for therapeutic drug level monitoring: Secondary | ICD-10-CM

## 2014-06-10 LAB — POCT INR: INR: 2.3

## 2014-06-18 ENCOUNTER — Ambulatory Visit (INDEPENDENT_AMBULATORY_CARE_PROVIDER_SITE_OTHER): Payer: Medicare Other | Admitting: Family Medicine

## 2014-06-18 DIAGNOSIS — Z5181 Encounter for therapeutic drug level monitoring: Secondary | ICD-10-CM

## 2014-06-18 LAB — POCT INR: INR: 2.3

## 2014-06-25 ENCOUNTER — Ambulatory Visit (INDEPENDENT_AMBULATORY_CARE_PROVIDER_SITE_OTHER): Payer: Medicare Other | Admitting: Family Medicine

## 2014-06-25 DIAGNOSIS — Z5181 Encounter for therapeutic drug level monitoring: Secondary | ICD-10-CM

## 2014-06-25 LAB — POCT INR: INR: 3.4

## 2014-06-27 ENCOUNTER — Other Ambulatory Visit: Payer: Self-pay | Admitting: Family Medicine

## 2014-07-05 ENCOUNTER — Ambulatory Visit (INDEPENDENT_AMBULATORY_CARE_PROVIDER_SITE_OTHER): Payer: Medicare Other | Admitting: *Deleted

## 2014-07-05 DIAGNOSIS — Z5181 Encounter for therapeutic drug level monitoring: Secondary | ICD-10-CM

## 2014-07-05 LAB — POCT INR: INR: 2.8

## 2014-07-12 ENCOUNTER — Ambulatory Visit (INDEPENDENT_AMBULATORY_CARE_PROVIDER_SITE_OTHER): Payer: Medicare Other | Admitting: *Deleted

## 2014-07-12 DIAGNOSIS — Z5181 Encounter for therapeutic drug level monitoring: Secondary | ICD-10-CM

## 2014-07-12 LAB — POCT INR: INR: 5.1

## 2014-07-19 ENCOUNTER — Ambulatory Visit (INDEPENDENT_AMBULATORY_CARE_PROVIDER_SITE_OTHER): Payer: Medicare Other | Admitting: *Deleted

## 2014-07-19 DIAGNOSIS — Z5181 Encounter for therapeutic drug level monitoring: Secondary | ICD-10-CM

## 2014-07-19 LAB — POCT INR: INR: 2.4

## 2014-07-20 ENCOUNTER — Ambulatory Visit (INDEPENDENT_AMBULATORY_CARE_PROVIDER_SITE_OTHER): Payer: Medicare Other | Admitting: *Deleted

## 2014-07-20 ENCOUNTER — Encounter: Payer: Self-pay | Admitting: Internal Medicine

## 2014-07-20 DIAGNOSIS — R001 Bradycardia, unspecified: Secondary | ICD-10-CM

## 2014-07-20 LAB — MDC_IDC_ENUM_SESS_TYPE_REMOTE
Implantable Pulse Generator Model: 2210
Implantable Pulse Generator Serial Number: 7427529
Lead Channel Sensing Intrinsic Amplitude: 2.2 mV
Lead Channel Sensing Intrinsic Amplitude: 8.3 mV
Lead Channel Setting Pacing Amplitude: 2 V
Lead Channel Setting Pacing Amplitude: 2.5 V
MDC IDC SET LEADCHNL RV PACING PULSEWIDTH: 0.4 ms
MDC IDC SET LEADCHNL RV SENSING SENSITIVITY: 2 mV

## 2014-07-20 NOTE — Progress Notes (Signed)
Remote pacemaker transmission.   

## 2014-07-23 ENCOUNTER — Telehealth: Payer: Self-pay | Admitting: Family Medicine

## 2014-07-23 ENCOUNTER — Ambulatory Visit (INDEPENDENT_AMBULATORY_CARE_PROVIDER_SITE_OTHER): Payer: Medicare Other | Admitting: *Deleted

## 2014-07-23 DIAGNOSIS — Z5181 Encounter for therapeutic drug level monitoring: Secondary | ICD-10-CM

## 2014-07-23 LAB — POCT INR: INR: 4.6

## 2014-07-23 NOTE — Telephone Encounter (Signed)
[facility] faxed INR results from 07/23/14.  INR-4.6.

## 2014-07-26 ENCOUNTER — Ambulatory Visit: Payer: Medicare Other | Admitting: *Deleted

## 2014-07-26 DIAGNOSIS — R001 Bradycardia, unspecified: Secondary | ICD-10-CM

## 2014-07-26 NOTE — Progress Notes (Signed)
Remote pacemaker transmission.   

## 2014-07-27 ENCOUNTER — Encounter: Payer: Self-pay | Admitting: Internal Medicine

## 2014-07-30 ENCOUNTER — Telehealth: Payer: Self-pay | Admitting: Family Medicine

## 2014-07-30 ENCOUNTER — Encounter: Payer: Self-pay | Admitting: *Deleted

## 2014-07-30 ENCOUNTER — Ambulatory Visit (INDEPENDENT_AMBULATORY_CARE_PROVIDER_SITE_OTHER): Payer: Medicare Other | Admitting: *Deleted

## 2014-07-30 DIAGNOSIS — Z5181 Encounter for therapeutic drug level monitoring: Secondary | ICD-10-CM

## 2014-07-30 LAB — POCT INR: INR: 2.1

## 2014-07-30 NOTE — Telephone Encounter (Signed)
[facility] faxed INR results for 07/30/14.  INR-2.1.

## 2014-08-06 LAB — PROTIME-INR

## 2014-08-09 ENCOUNTER — Encounter: Payer: Self-pay | Admitting: Family Medicine

## 2014-08-10 ENCOUNTER — Telehealth: Payer: Self-pay | Admitting: *Deleted

## 2014-08-10 NOTE — Telephone Encounter (Signed)
Spoke to pt who is addiment that her PT/INR is out of range. In viewing pts chart, I do see that it was 2.1 on 10/23 but it was rechecked on 10/30 and pt states that this is the most recent.

## 2014-08-10 NOTE — Telephone Encounter (Signed)
Pt wanting to be reached on home phone at [phone]

## 2014-08-10 NOTE — Telephone Encounter (Signed)
Oh I see.  I did not see that result.  Please apologize to her for me.   If she is still taking 11 mg daily, please hold dose today and tomorrow, resume home dose after that and recheck PT/INR on Friday.

## 2014-08-11 ENCOUNTER — Telehealth: Payer: Self-pay

## 2014-08-11 NOTE — Telephone Encounter (Signed)
Lm on pts vm requesting a call back. Advised pt to hold today's dose and resume on tomorrow. Pt is to have recheck on Friday

## 2014-08-11 NOTE — Telephone Encounter (Signed)
Per Dr Deborra Medina, pt is to only hold today's dose and recheck Friday

## 2014-08-11 NOTE — Telephone Encounter (Signed)
Lm on pts vm requesting a call back. Since i did not speak with her yesterday afternoon, does she need to hold today and tomorrow's dose and recheck Friday, or hold only today's dose?

## 2014-08-11 NOTE — Telephone Encounter (Signed)
Called patient//LMOVM for pt to call and schedule Annual medicare exam needed for 2015.

## 2014-08-13 LAB — PROTIME-INR

## 2014-08-16 ENCOUNTER — Encounter: Payer: Self-pay | Admitting: Family Medicine

## 2014-08-18 NOTE — Telephone Encounter (Signed)
Spoke with patient again.  She said that she is going to go ahead and check her levels today because she has to get off the Friday rotation.  If she checks it on Friday and we cannot get back with her until Monday or Tuesday that could be too long. I told her it would not hurt anything for her to go ahead and check her levels today and then start the Wednesday weekly rotation.  If there is an issue with that, please call the patient on her home phone number. [phone] Pt has her Medicare Annual Wellness Visit on Tuesday 11/17 as well if anything needs to be addressed about her Coumadin. Apologized several times to the patient about the confusion she has experienced for the past couple of weeks.

## 2014-08-18 NOTE — Telephone Encounter (Signed)
That sounds great.  Thank you

## 2014-08-18 NOTE — Telephone Encounter (Signed)
Pt called stating that she typically has to test herself every single week.  Dr. Hulen Shouts last note states to check it in 2 weeks.  Pt originally checked it last on 11/6 and we called her back 3 days later and have been playing phone tag ever since.  The patient said that her blood levels are all out of whack and that she should be testing every week.  She wants Dr. Deborra Medina to call her and talk about this to ensure that it's okay not to check again until 2 weeks after 11/6.  If anyone calls her and does not reach her, they can leave a detailed message.

## 2014-08-18 NOTE — Telephone Encounter (Signed)
Left VM for patient and explained specifically to take her levels again this Friday 11/13 since she last checked her Coumadin levels on 11/6 and that would be 1 week.  Asked pt to call back confirming she got my call.  I also asked her in our earlier conversation if she had another better phone number for [place] to reach her since she seems to not answer the phone often and then has to call us back.

## 2014-08-18 NOTE — Telephone Encounter (Signed)
I am glad she called.  Yes, let's check it weekly.  Since our coumadin nurse resigned, we have all been getting accustomed to the new system.  Please apologize for me.

## 2014-08-20 ENCOUNTER — Encounter: Payer: Self-pay | Admitting: Family Medicine

## 2014-08-20 ENCOUNTER — Telehealth: Payer: Self-pay | Admitting: Family Medicine

## 2014-08-20 ENCOUNTER — Telehealth: Payer: Self-pay | Admitting: *Deleted

## 2014-08-20 LAB — PROTIME-INR

## 2014-08-20 NOTE — Telephone Encounter (Signed)
-----   Message from Lucille Passy, MD sent at 08/20/2014  3:35 PM EST ----- Please call pt: INR is 3.6- a little above goal.  She should be taking 11 mg daily.  Since she has had a couple elevated readings recently taking 11 mg daily, let's decrease dose to 8 mg on Saturday, 11 mg all other days (Sunday - Friday).  Recheck it again in one week - or on Wednesday since she was planning on resuming Wednesday rotation.

## 2014-08-20 NOTE — Telephone Encounter (Signed)
Patient's INR results came and I scanned them in for you.  Patient's INR is 3.6.  Earl Lagos is out of the office.  Please send your recommendations to [place].

## 2014-08-20 NOTE — Telephone Encounter (Signed)
Dr. Deborra Medina, I called the pt a notified her of her INR results and explained it was a little above goal.  She said she takes her Coumadin at night so she will take her 8mg  tonight and will take 11mg  every night until she does her next recheck on Wednesday.  She is happy to be starting her new Wednesday rotation.

## 2014-08-20 NOTE — Telephone Encounter (Signed)
Great. Thank you.

## 2014-08-20 NOTE — Telephone Encounter (Signed)
I sent recommendation to [facility].

## 2014-08-23 NOTE — Telephone Encounter (Signed)
Pt currently on warfarin and checks at home. Pt will check weekly on Wednesday's per Vincente Liberty

## 2014-08-24 ENCOUNTER — Encounter: Payer: Medicare Other | Admitting: Family Medicine

## 2014-08-25 LAB — PROTIME-INR: INR: 3.4 — AB (ref 0.9–1.1)

## 2014-08-26 ENCOUNTER — Other Ambulatory Visit: Payer: Self-pay | Admitting: *Deleted

## 2014-08-26 MED ORDER — WARFARIN SODIUM 4 MG PO TABS: ORAL_TABLET | ORAL | Status: DC

## 2014-09-01 ENCOUNTER — Encounter: Payer: Self-pay | Admitting: Family Medicine

## 2014-09-01 ENCOUNTER — Telehealth: Payer: Self-pay | Admitting: Family Medicine

## 2014-09-01 LAB — PROTIME-INR

## 2014-09-01 NOTE — Telephone Encounter (Signed)
Patient called with the Coumadin company representative who faxes her levels to [place] each week.  The patient screamed at me about how we are wasting her time and she did not need to sit on the phone for me to give a fax number to these people.  I calmly called her by name until she stopped yelling.  I then asked her if she had tested her Coumadin level today.  She said "No I have not!" and I have already told you people.  Then she hung up on me.   The Coumadin company [facility] called back and she confirmed our fax number.  She said the patient screamed at her several times today too.  She said that they have notes that a normal range for this patient's levels are 1.5-4.9 I tried to explain to the patient that she was being discharged due to her inappropriate behavior but she had already slammed the phone down.  I tried to call back and the line was busy.  I tried to call again and she did not answer. Letter for discharge has been printed.

## 2014-09-06 ENCOUNTER — Telehealth: Payer: Self-pay | Admitting: Family Medicine

## 2014-09-06 NOTE — Telephone Encounter (Addendum)
°  Patient dismissed from [facility] by Arnette Norris MD , effective September 01, 2014. Dismissal letter sent out by certified / registered mail.  [facility]  Certified dismissal letter returned as undeliverable, unclaimed, return to sender after three attempts by [place]. Letter placed in another envelope and resent as 1st class mail which does not require a signature. 10/15/2014  DAJ

## 2014-09-06 NOTE — Telephone Encounter (Signed)
Noted! Thank you

## 2014-09-06 NOTE — Telephone Encounter (Signed)
Tried to call pt's home number but no answer and no voicemail.  Called pt on cell phone and left a VM letting her know that her CPE was being cancelled for tomorrow.  I explained she would be receiving a certified letter verifying her dismissal from our practice.  I explained that it was no longer safe for [place] to care for her or her Coumadin due to her not wanting to talk to Dr. Hulen Shouts assistant or me at times.  Sending dismissal letter to medical records now to be sent out. Also notified pt in VM that we would fill her meds for this month if needed until she can find a new provider.

## 2014-09-07 ENCOUNTER — Encounter: Payer: Medicare Other | Admitting: Family Medicine

## 2014-09-15 ENCOUNTER — Encounter: Payer: Medicare Other | Admitting: Internal Medicine

## 2014-09-16 LAB — PROTIME-INR

## 2014-09-17 ENCOUNTER — Encounter: Payer: Self-pay | Admitting: Family Medicine

## 2014-09-29 ENCOUNTER — Encounter: Payer: Self-pay | Admitting: *Deleted

## 2014-10-11 ENCOUNTER — Ambulatory Visit (INDEPENDENT_AMBULATORY_CARE_PROVIDER_SITE_OTHER): Payer: Medicare Other | Admitting: Family Medicine

## 2014-10-11 DIAGNOSIS — Z5181 Encounter for therapeutic drug level monitoring: Secondary | ICD-10-CM

## 2014-10-11 LAB — POCT INR: INR: 2

## 2014-10-29 LAB — PROTIME-INR

## 2014-11-01 ENCOUNTER — Encounter: Payer: Self-pay | Admitting: Family Medicine

## 2014-11-03 ENCOUNTER — Telehealth: Payer: Self-pay | Admitting: Family Medicine

## 2014-11-03 NOTE — Telephone Encounter (Signed)
Called and left VM for patient explaining that she has been discharged from our practice and that she needs to make sure her INR results are being sent to her new PCP.  We sent a letter letting pt know she was discharged and I also left her a voicemail.

## 2014-11-26 LAB — CBC AND DIFFERENTIAL
HCT: 39 % (ref 36–46)
Hemoglobin: 12.5 g/dL (ref 12.0–16.0)
NEUTROS ABS: 52 /uL
PLATELETS: 240 10*3/uL (ref 150–399)
WBC: 5.8 10*3/mL

## 2014-11-26 LAB — HEPATIC FUNCTION PANEL
ALT: 16 U/L (ref 7–35)
AST: 27 U/L (ref 13–35)
Alkaline Phosphatase: 78 U/L (ref 25–125)
BILIRUBIN, TOTAL: 0.3 mg/dL

## 2014-11-26 LAB — BASIC METABOLIC PANEL
BUN: 17 mg/dL (ref 4–21)
CREATININE: 1.3 mg/dL — AB (ref 0.5–1.1)
Glucose: 98 mg/dL
POTASSIUM: 4 mmol/L (ref 3.4–5.3)
Sodium: 141 mmol/L (ref 137–147)

## 2015-02-22 ENCOUNTER — Encounter: Payer: Self-pay | Admitting: Internal Medicine

## 2015-02-22 ENCOUNTER — Ambulatory Visit (INDEPENDENT_AMBULATORY_CARE_PROVIDER_SITE_OTHER): Payer: PPO | Admitting: *Deleted

## 2015-02-22 DIAGNOSIS — R001 Bradycardia, unspecified: Secondary | ICD-10-CM | POA: Diagnosis not present

## 2015-02-23 ENCOUNTER — Telehealth: Payer: Self-pay | Admitting: Internal Medicine

## 2015-02-23 NOTE — Progress Notes (Signed)
Remote pacemaker transmission.   

## 2015-02-23 NOTE — Telephone Encounter (Signed)
Spoke w/ pt and informed her that we have mailed 2 letters to her since October. The 1st letter was mailed 07-30-2014 and the 2nd letter was mailed 09-30-2015. Pt informed me that she never received these letters and is very frustrated b/c she has not had communication w/ office about her PPM. I informed pt that she is overdue to see MD in office and ask if she wanted to schedule this appt today. She said she would call back to schedule that appt.

## 2015-02-23 NOTE — Telephone Encounter (Signed)
Patient would like for RN to call her back regarding how often her pacer is checked remotely and the status of the checks.  She said that if you could not get her on the phone, that you can mail the information to her home.  She said "I haven't heard from you people since the remote device was put in my home"

## 2015-03-02 LAB — CUP PACEART REMOTE DEVICE CHECK
Battery Remaining Longevity: 84 mo
Brady Statistic AP VP Percent: 1 %
Brady Statistic AP VS Percent: 8.8 %
Brady Statistic AS VP Percent: 1 %
Brady Statistic AS VS Percent: 91 %
Brady Statistic RV Percent Paced: 1 %
Lead Channel Impedance Value: 390 Ohm
Lead Channel Impedance Value: 390 Ohm
Lead Channel Pacing Threshold Amplitude: 0.75 V
Lead Channel Pacing Threshold Pulse Width: 0.4 ms
Lead Channel Sensing Intrinsic Amplitude: 2.9 mV
Lead Channel Sensing Intrinsic Amplitude: 9.2 mV
Lead Channel Setting Pacing Amplitude: 3 V
Lead Channel Setting Pacing Pulse Width: 0.4 ms
MDC IDC MSMT BATTERY REMAINING PERCENTAGE: 74 %
MDC IDC MSMT BATTERY VOLTAGE: 2.95 V
MDC IDC MSMT LEADCHNL RV PACING THRESHOLD AMPLITUDE: 1.5 V
MDC IDC MSMT LEADCHNL RV PACING THRESHOLD PULSEWIDTH: 0.4 ms
MDC IDC PG SERIAL: 7427529
MDC IDC SESS DTM: 20160517071058
MDC IDC SET LEADCHNL RA PACING AMPLITUDE: 2 V
MDC IDC SET LEADCHNL RV SENSING SENSITIVITY: 2 mV
MDC IDC STAT BRADY RA PERCENT PACED: 8.6 %
Pulse Gen Model: 2210

## 2015-03-09 ENCOUNTER — Encounter: Payer: Self-pay | Admitting: Cardiology

## 2015-04-19 ENCOUNTER — Encounter: Payer: Self-pay | Admitting: Family Medicine

## 2015-04-20 LAB — PROTIME-INR

## 2015-04-26 ENCOUNTER — Encounter: Payer: Self-pay | Admitting: Family Medicine

## 2015-05-05 LAB — PROTIME-INR

## 2015-05-12 ENCOUNTER — Encounter: Payer: Self-pay | Admitting: Family Medicine

## 2015-05-12 LAB — PROTIME-INR

## 2015-05-16 ENCOUNTER — Encounter: Payer: Self-pay | Admitting: Family Medicine

## 2015-05-20 ENCOUNTER — Encounter: Payer: Self-pay | Admitting: Family Medicine

## 2015-05-26 LAB — PROTIME-INR: INR: 1.5 — AB (ref 0.9–1.1)

## 2015-05-27 ENCOUNTER — Ambulatory Visit: Payer: Self-pay | Admitting: Family Medicine

## 2015-05-30 ENCOUNTER — Encounter: Payer: Self-pay | Admitting: Family Medicine

## 2015-06-01 ENCOUNTER — Encounter: Payer: Self-pay | Admitting: Internal Medicine

## 2015-06-03 ENCOUNTER — Other Ambulatory Visit: Payer: Self-pay | Admitting: Family Medicine

## 2015-06-03 ENCOUNTER — Telehealth: Payer: Self-pay | Admitting: Family Medicine

## 2015-06-03 DIAGNOSIS — Z7901 Long term (current) use of anticoagulants: Principal | ICD-10-CM

## 2015-06-03 DIAGNOSIS — M25569 Pain in unspecified knee: Secondary | ICD-10-CM

## 2015-06-03 DIAGNOSIS — I482 Chronic atrial fibrillation, unspecified: Secondary | ICD-10-CM

## 2015-06-03 DIAGNOSIS — Z5181 Encounter for therapeutic drug level monitoring: Secondary | ICD-10-CM

## 2015-06-03 LAB — PROTIME-INR

## 2015-06-03 MED ORDER — WARFARIN SODIUM 4 MG PO TABS: ORAL_TABLET | ORAL | Status: DC

## 2015-06-03 MED ORDER — GABAPENTIN 300 MG PO CAPS: ORAL_CAPSULE | ORAL | Status: DC

## 2015-06-03 MED ORDER — GABAPENTIN 300 MG PO CAPS: 300.0000 mg | ORAL_CAPSULE | Freq: Three times a day (TID) | ORAL | Status: DC

## 2015-06-03 NOTE — Progress Notes (Signed)
Spoke with patient myself over the phone after verifying name and DOB. I get weekly INR reports on [name] and the past two reports have showed INR less that 2, her goal INR is between 2-3.  I have confirmed a mix up with her prescription instructions for coumadin.  I have changed her coumadin regimen as follows and [name] has repeated it back to me several times:  Coumadin 4mg  take 2 tablets (8mg ) on Mon, Thurs, Sat Coumadin 4mg  take 3 tablets (12mg ) on Tue, Wed, Fri, Sun

## 2015-06-03 NOTE — Telephone Encounter (Signed)
Contacted this patient to inform her that Dr. Nadine Counts spoke with the pharmacist at [facility] and everything was taken care of. Patient said ok and thanks.

## 2015-06-03 NOTE — Telephone Encounter (Signed)
Patient states that the quanitity for warfarin 4mg  is correct but the directions is wrong, it should say Take As Directed. Also, Gabapentin quanitity was correct but it also should say Take As Directed. The pharmacy will not refill the medications until it is entered in correctly. Patient is requesting that this be taken care of before we leave today and is requesting a return call.

## 2015-06-06 ENCOUNTER — Telehealth: Payer: Self-pay

## 2015-06-06 NOTE — Telephone Encounter (Signed)
Already handled on Friday 06/03/15.

## 2015-06-06 NOTE — Telephone Encounter (Signed)
Got a call from [facility] with MD INR stating that had an abnormal INR reading. She stated the value was 1.2 on 06/03/15 at 12:54pm. I informed her that Dr. Nadine Counts had already spoken with the patient and the pharmacist regarding her numbers and the changes that should take place. I even went over the values as it is listed in her medication list and she said ok.

## 2015-06-10 LAB — PROTIME-INR

## 2015-06-14 ENCOUNTER — Encounter: Payer: Self-pay | Admitting: Family Medicine

## 2015-06-23 LAB — PROTIME-INR: INR: 2.6 — AB (ref 0.9–1.1)

## 2015-06-27 LAB — PROTIME-INR

## 2015-06-28 ENCOUNTER — Encounter: Payer: Self-pay | Admitting: Family Medicine

## 2015-06-30 ENCOUNTER — Encounter: Payer: Self-pay | Admitting: Internal Medicine

## 2015-07-01 ENCOUNTER — Encounter: Payer: Self-pay | Admitting: Family Medicine

## 2015-07-05 ENCOUNTER — Telehealth: Payer: Self-pay | Admitting: Family Medicine

## 2015-07-05 ENCOUNTER — Other Ambulatory Visit: Payer: Self-pay | Admitting: Family Medicine

## 2015-07-05 NOTE — Telephone Encounter (Signed)
Patient is trying to schedule an appointment with you however you are completely booked until Monday. She would like for me to inform you that a knot has developed in the top of her head, one behind her right ear and one in the groin area. Her toe nails are beginning to split as well. She is being tested this Thursday for her coumadin levels. She will contact her other doctors as well. She would like to know if you are able to work her in because she is afraid. Please return call to discuss this.

## 2015-07-06 ENCOUNTER — Encounter: Payer: Self-pay | Admitting: Family Medicine

## 2015-07-06 ENCOUNTER — Encounter: Payer: Self-pay | Admitting: *Deleted

## 2015-07-06 NOTE — Telephone Encounter (Signed)
I have asked her to follow up with me several times now and she has failed to do so. Her symptoms may be related to her Behcet's disease so I recommend that she address this with the specialist who is managing her disorder otherwise she may schedule a follow up with me when there is an availability and we will also try and call her if anything opens up sooner.

## 2015-07-07 LAB — PROTIME-INR: INR: 1.8 — AB (ref 0.9–1.1)

## 2015-07-07 NOTE — Telephone Encounter (Signed)
Patient informed and was signed up for mychart. She stated that she does not see a specialist for the condition and stated that she see dr Nadine Counts for it.

## 2015-07-11 ENCOUNTER — Encounter: Payer: Self-pay | Admitting: Family Medicine

## 2015-07-11 ENCOUNTER — Ambulatory Visit (INDEPENDENT_AMBULATORY_CARE_PROVIDER_SITE_OTHER): Payer: PPO | Admitting: Family Medicine

## 2015-07-11 ENCOUNTER — Other Ambulatory Visit: Payer: Self-pay | Admitting: Family Medicine

## 2015-07-11 ENCOUNTER — Telehealth: Payer: Self-pay

## 2015-07-11 VITALS — BP 160/100 | HR 97 | Temp 98.8°F | Resp 18 | Wt 251.3 lb

## 2015-07-11 DIAGNOSIS — I10 Essential (primary) hypertension: Secondary | ICD-10-CM | POA: Diagnosis not present

## 2015-07-11 DIAGNOSIS — Z5181 Encounter for therapeutic drug level monitoring: Secondary | ICD-10-CM | POA: Diagnosis not present

## 2015-07-11 DIAGNOSIS — Z7901 Long term (current) use of anticoagulants: Secondary | ICD-10-CM | POA: Diagnosis not present

## 2015-07-11 DIAGNOSIS — G8921 Chronic pain due to trauma: Secondary | ICD-10-CM | POA: Insufficient documentation

## 2015-07-11 DIAGNOSIS — I482 Chronic atrial fibrillation, unspecified: Secondary | ICD-10-CM

## 2015-07-11 DIAGNOSIS — L52 Erythema nodosum: Secondary | ICD-10-CM | POA: Diagnosis not present

## 2015-07-11 DIAGNOSIS — M25532 Pain in left wrist: Secondary | ICD-10-CM

## 2015-07-11 DIAGNOSIS — R252 Cramp and spasm: Secondary | ICD-10-CM | POA: Insufficient documentation

## 2015-07-11 DIAGNOSIS — I2510 Atherosclerotic heart disease of native coronary artery without angina pectoris: Secondary | ICD-10-CM | POA: Insufficient documentation

## 2015-07-11 DIAGNOSIS — Z87828 Personal history of other (healed) physical injury and trauma: Secondary | ICD-10-CM | POA: Insufficient documentation

## 2015-07-11 DIAGNOSIS — K2100 Gastro-esophageal reflux disease with esophagitis, without bleeding: Secondary | ICD-10-CM | POA: Insufficient documentation

## 2015-07-11 DIAGNOSIS — M352 Behcet's disease: Secondary | ICD-10-CM | POA: Diagnosis not present

## 2015-07-11 DIAGNOSIS — E876 Hypokalemia: Secondary | ICD-10-CM | POA: Insufficient documentation

## 2015-07-11 LAB — POCT INR
INR: 1.2
PT: 14.3

## 2015-07-11 MED ORDER — PREDNISONE 10 MG (21) PO TBPK: ORAL_TABLET | ORAL | Status: DC

## 2015-07-11 MED ORDER — LOSARTAN POTASSIUM 100 MG PO TABS: 100.0000 mg | ORAL_TABLET | Freq: Every day | ORAL | Status: DC

## 2015-07-11 MED ORDER — WARFARIN SODIUM 4 MG PO TABS: ORAL_TABLET | ORAL | Status: DC
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
## 2015-07-11 NOTE — Telephone Encounter (Signed)
[facility] and was informed that this patient does not have any services set up with them other than for her tracheostomy. She stated that they could send someone out to drawn her blood but she will have to have an order for nursing services set up.   I told her thank you and that I would inform Dr. Nadine Counts

## 2015-07-11 NOTE — Telephone Encounter (Signed)
Referral placed.

## 2015-07-12 ENCOUNTER — Encounter: Payer: Self-pay | Admitting: Sports Medicine

## 2015-07-12 ENCOUNTER — Ambulatory Visit (INDEPENDENT_AMBULATORY_CARE_PROVIDER_SITE_OTHER): Payer: PPO

## 2015-07-12 ENCOUNTER — Ambulatory Visit (INDEPENDENT_AMBULATORY_CARE_PROVIDER_SITE_OTHER): Payer: PPO | Admitting: Sports Medicine

## 2015-07-12 ENCOUNTER — Encounter: Payer: Self-pay | Admitting: Family Medicine

## 2015-07-12 VITALS — BP 180/95 | HR 90 | Resp 18

## 2015-07-12 DIAGNOSIS — G6189 Other inflammatory polyneuropathies: Secondary | ICD-10-CM

## 2015-07-12 DIAGNOSIS — B351 Tinea unguium: Secondary | ICD-10-CM | POA: Diagnosis not present

## 2015-07-12 DIAGNOSIS — L603 Nail dystrophy: Secondary | ICD-10-CM

## 2015-07-12 DIAGNOSIS — R52 Pain, unspecified: Secondary | ICD-10-CM | POA: Diagnosis not present

## 2015-07-12 NOTE — Patient Instructions (Signed)
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
## 2015-07-13 ENCOUNTER — Telehealth: Payer: Self-pay | Admitting: *Deleted

## 2015-07-13 ENCOUNTER — Telehealth: Payer: Self-pay | Admitting: Family Medicine

## 2015-07-13 ENCOUNTER — Telehealth: Payer: Self-pay

## 2015-07-13 NOTE — Telephone Encounter (Signed)
Pt states she had her 2 big toenails remover permanently, was told to soak in betadine, but not for how long. I told pt to soak 42minutes 2 times a day, for about 4-6 weeks and to apply the antibiotic that was prescribed after each soak, until the areas got a dry, hard scab without drainage.  Pt states she was told to soak until she was seen again, I told her that was correct, at that time we would be able to more closely determine how long she would need to continue the soaks.  Pt states understanding.

## 2015-07-13 NOTE — Telephone Encounter (Signed)
Consulted with Dr. Nadine Counts regarding this issue and according to the notes in [employer], this patient was supposed to be taking Metoprolol 200mg  along with the Losartan 100mg , but she stated that she was never told this and has not been taking this medication. This patient was upset about not knowing about certain medications and wanted to know what it was not written on the paper. She also asked if we were going to be sending out a nurse to do her blood draw and I informed her that I spoke with [facility] and they told me that she did not have nursing services set up with them so that she could have her blood drawn and that she only got her Trac supplies from them. I then encouraged her to give them a call to find out for herself what they can and cannot do so that we could get these issues taken care of. She said ok.  Dr. Nadine Counts & Marnette Burgess Mock was informed of this conversation.

## 2015-07-13 NOTE — Telephone Encounter (Signed)
Contacted this patient to inform her that a referral has been placed to get her re-started with [facility] services but she had just taken some medication and was not feeling too well so I spoke with a family friend [name] that was there with her. She stated that this patient had called her insurance company and they told her to contact [facility] so they could help her.  I informed her that since there was a staffing shortage at [facility] that she probably will not be seen anytime soon and should go to another [facility] draw site. She asked if she could go to the one in [place] and I told her yes. There hours were given and then I asked what medication did she take and [name] stated it was her normal medication but it did not include the Metoprolol b/c she has not had that filled yet. She went on to say that she was feeling very light-headed. I strongly advised her to call the paramedics due to her sx may be from her blood pressure being greatly elevated. I told [name] to call them immediately and not to worry about the other stuff at this time. She said ok.

## 2015-07-13 NOTE — Telephone Encounter (Signed)
PT SAID BP YESTERDAY WAS 180/95 AND TOOK THE EXTRA 50MG  LIKE YOU ALL SAID. WANTS TO KNOW WHAT THE RX METOPROLOL 200MG  24 HR TABLETS. SHE SAID THAT SHE HAS NEVER HAD THIS MEDICATION AND IS NOT TAKING IT BUT WAS ON THE ENCOUNTER FROM YESTERDAY. ARE YOU SENDING A HOME HEALTH NURSE OUT TO GEY HER BLOOD? ALSO SAID SHE CAN NOT WALK TODAY FOR THE DR SCOVER TOOK BOTH BIG TOE NAILS OFF AND HAS IT BANDAGES WRAPPED.

## 2015-07-14 LAB — PROTIME-INR: INR: 1.2 — AB (ref 0.9–1.1)

## 2015-07-15 ENCOUNTER — Telehealth: Payer: Self-pay

## 2015-07-15 ENCOUNTER — Telehealth: Payer: Self-pay | Admitting: Family Medicine

## 2015-07-15 NOTE — Telephone Encounter (Signed)
Tried to call patient on Friday 07/15/15, left voicemail and she called right back and informed her of her INR and dosage.  She understood.  She also wanted to let u know she has Podiatry appt on Friday.

## 2015-07-15 NOTE — Telephone Encounter (Signed)
Lb called in INR:1.2

## 2015-07-15 NOTE — Telephone Encounter (Signed)
Per Dr. Nadine Counts, please inform patient of her INR result and to start taking 12mg  of coumadin daily.  Currently the patient has the 4mg  tablets, so she just needs to take 3 of those daily.

## 2015-07-19 ENCOUNTER — Telehealth: Payer: Self-pay | Admitting: Family Medicine

## 2015-07-19 NOTE — Telephone Encounter (Signed)
Dr. Nadine Counts already spoke to [facility] and coumadin dose was increased

## 2015-07-19 NOTE — Telephone Encounter (Signed)
Called pt and was not able to leave a voicemail.  °

## 2015-07-19 NOTE — Telephone Encounter (Signed)
Pt wanted to speak with a nurse regarding her coumadin

## 2015-07-21 NOTE — Telephone Encounter (Signed)
Please return patient call 

## 2015-07-21 NOTE — Telephone Encounter (Signed)
Please convey to [name] the following:  1) I spoke with her coumadin management company (who called after she called our offices with INR result of 1.4) and asked them to send someone to her home to re-calibrate and re-check her INR machine to make sure it is working properly. This is because she is on high doses of coumadin, now at 12 mg a day, and despite this her INR is not at goal of 2-3 range.   I strongly encourage her to follow up with me sooner than she is scheduled so I can check her INR in our offices to make sure her numbers/readings at home are accurate. At this point I am not comfortable advising her to increase her coumadin to more than her current 12 mg a day. Hopefully her INR will continue to rise appropriately.   I also advice that she secures and appointment with her cardiologist so that we may consult them if her INR does not reach goal soon.  2) Her blood pressure reading from home of 148/80 is much improved since her high blood pressures here in the office. Which blood pressure medications is she taking?

## 2015-07-21 NOTE — Telephone Encounter (Signed)
Patient stated that she is taking the medication as directed. Patient also stated that her INR machine has only been checked once since it has been put in placed. She gave me the gentleman's name and number: Larkin Ina [phone] or [phone].   Patient stated that she has never taken the Metoprolol due to the cost. She stated that even the generic was $85, so she is only taking the medications she had but at a higher dose.  Patient stated that the paramedics did come out but she refused to go to [facility]. Patient was advised to use the [facility] across the street from [facility] since she had an appt there tomorrow at 2:45pm.  Dee with the [employer] is requesting a note (doctor's statement) stating all of this patient's medical illnesses to be faxed to their office at [phone] by August 01, 2015 and she will excuse her from jury duty.  Patient was informed that I will consult with Dr. Nadine Counts.

## 2015-07-22 ENCOUNTER — Encounter: Payer: Self-pay | Admitting: Sports Medicine

## 2015-07-22 ENCOUNTER — Telehealth: Payer: Self-pay

## 2015-07-22 ENCOUNTER — Ambulatory Visit (INDEPENDENT_AMBULATORY_CARE_PROVIDER_SITE_OTHER): Payer: PPO | Admitting: Sports Medicine

## 2015-07-22 DIAGNOSIS — R52 Pain, unspecified: Secondary | ICD-10-CM

## 2015-07-22 DIAGNOSIS — L603 Nail dystrophy: Secondary | ICD-10-CM

## 2015-07-22 NOTE — Telephone Encounter (Signed)
I tried the number that this patient gave me with hopes to speak to [place], but he was gone for the day. I spoke to [facility] and she did confirm that this patient uses their equipment under their respiratory care and that Larkin Ina was one of their respiratory technicians. She informed me that although they open at 8am I would not be able to reach anyone until after 10. She also told me that I should speak with Marita [place] so I said that I would call back on Monday.

## 2015-07-22 NOTE — Progress Notes (Signed)
Patient ID: [name], female   DOB: 1951/09/25, 64 y.o.   MRN: [id number] Subjective: [name] is a 64 y.o.  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 Patient has been soaking using betadine and applying topical antibiotic covered with dressings daily. Patient deniesfever/chills/nausea/vomitting/any other related constitutional symptoms at this time.  Objective:   General: Well developed, nourished, in no acute distress, alert and oriented x3, Cane assisted gait. Trach patient  Dermatology: Skin is warm, dry and supple bilateral. Bilateral hallux nail beds appear to be clean, dry, with mild granular tissue and no surrounding eschar/scab. (-) Erythema. Minimal Edema. (-) serosanguous drainage present. The remaining nails appear mycotic but asymptomatic at this time. There are no other lesions or other signs of infection present.  Neurovascular status: Intact. No lower extremity swelling; No pain with calf compression bilateral.  Musculoskeletal: Mild tenderness to palpation of the Bilateral hallux nail beds. Muscular strength 4/5 bilateral.   Assesement and Plan: Problem List Items Addressed This Visit    None    Visit Diagnoses    Nail dystrophy    -  Primary    Status post total bilateral hallux permanent nail avulsions (07/12/15)     Pain        Bilateral hallux,  improving      -Examined patient  -Applied topical antibiotic covered with bandaid to both hallux nail beds.  -Discussed plan of care with patient. -Patient to now begin soaking in a weak solution of Epsom salt and warm water. Patient was instructed to soak for 15-20 minutes each day until all drainage, redness, tenderness, or swelling is gone from the procedure site, and apply neosporin and a gauze or bandaid dressing each day as needed. May leave open to air at night.   -Educated patient on long term care after nail surgery. -Patient was instrcuted to monitor the toe for reoccurrence and signs of infection; Patient advised to return to office if toe becomes red, hot or swollen. -Patient is to return in as needed or sooner if problems arise.  Landis Martins, DPM

## 2015-07-23 LAB — RHEUMATOID FACTOR: Rhuematoid fact SerPl-aCnc: <10 IU/mL (ref 0.0–13.9)

## 2015-07-23 LAB — COMPREHENSIVE METABOLIC PANEL
ALBUMIN: 3.7 g/dL (ref 3.6–4.8)
ALK PHOS: 68 IU/L (ref 39–117)
ALT: 15 IU/L (ref 0–32)
AST: 15 IU/L (ref 0–40)
Albumin/Globulin Ratio: 1.3 (ref 1.1–2.5)
BUN / CREAT RATIO: 14 (ref 11–26)
BUN: 19 mg/dL (ref 8–27)
Bilirubin Total: 0.4 mg/dL (ref 0.0–1.2)
CO2: 25 mmol/L (ref 18–29)
CREATININE: 1.39 mg/dL — AB (ref 0.57–1.00)
Calcium: 9 mg/dL (ref 8.7–10.3)
Chloride: 103 mmol/L (ref 97–108)
GFR calc non Af Amer: 41 mL/min/{1.73_m2} — ABNORMAL LOW (ref >59)
GFR, EST AFRICAN AMERICAN: 47 mL/min/{1.73_m2} — AB (ref >59)
GLOBULIN, TOTAL: 2.8 g/dL (ref 1.5–4.5)
Glucose: 106 mg/dL — ABNORMAL HIGH (ref 65–99)
Potassium: 3.9 mmol/L (ref 3.5–5.2)
SODIUM: 139 mmol/L (ref 134–144)
TOTAL PROTEIN: 6.5 g/dL (ref 6.0–8.5)

## 2015-07-23 LAB — CBC WITH DIFFERENTIAL/PLATELET
Basophils Absolute: 0 10*3/uL (ref 0.0–0.2)
Basos: 0 %
EOS (ABSOLUTE): 0.1 10*3/uL (ref 0.0–0.4)
EOS: 1 %
HEMATOCRIT: 37.4 % (ref 34.0–46.6)
HEMOGLOBIN: 12.2 g/dL (ref 11.1–15.9)
IMMATURE GRANS (ABS): 0 10*3/uL (ref 0.0–0.1)
Immature Granulocytes: 1 %
LYMPHS ABS: 1.9 10*3/uL (ref 0.7–3.1)
Lymphs: 28 %
MCH: 29 pg (ref 26.6–33.0)
MCHC: 32.6 g/dL (ref 31.5–35.7)
MCV: 89 fL (ref 79–97)
MONOCYTES: 9 %
Monocytes Absolute: 0.6 10*3/uL (ref 0.1–0.9)
NEUTROS ABS: 4.2 10*3/uL (ref 1.4–7.0)
Neutrophils: 61 %
Platelets: 259 10*3/uL (ref 150–379)
RBC: 4.2 x10E6/uL (ref 3.77–5.28)
RDW: 14.2 % (ref 12.3–15.4)
WBC: 6.9 10*3/uL (ref 3.4–10.8)

## 2015-07-23 LAB — SEDIMENTATION RATE: SED RATE: 26 mm/h (ref 0–40)

## 2015-07-23 LAB — ANA: Anti Nuclear Antibody(ANA): NEGATIVE

## 2015-07-25 ENCOUNTER — Telehealth: Payer: Self-pay

## 2015-07-25 ENCOUNTER — Other Ambulatory Visit: Payer: Self-pay | Admitting: Family Medicine

## 2015-07-25 LAB — CARDIOLIPIN ANTIBODIES, IGG, IGM, IGA: Anticardiolipin IgM: <9 MPL U/mL (ref 0–12)

## 2015-07-25 NOTE — Telephone Encounter (Signed)
Spoke to [place] to get some information regarding the calibration of this patient's inr machine and she stated that the original order went back to August 2014, but that she would have to get with the other therapist so she will be able to get me more information. My name and our office number was given.

## 2015-07-25 NOTE — Telephone Encounter (Signed)
Informed patient of lab results and that a copy will be placed in the mail for her review. Also a copy of the letter from Dr. Nadine Counts regarding jury duty was faxed to the attn of [place] at [phone]. Confirmation was received and an original was placed in the mail with her lab results.

## 2015-07-27 ENCOUNTER — Telehealth: Payer: Self-pay | Admitting: Family Medicine

## 2015-07-27 NOTE — Telephone Encounter (Signed)
I personally spoke with a [facility] nurse this morning to clarify this patient's cardiovascular history and management. Dr Cristopher Peru manages her pacemaker primarily and she has missed appointments with him. Dr. Melvenia Needles is her cardiologist (established early 2015) however she has not followed up with neither Dr. Lovena Le or Dr. Christ Kick this year.  I left a message with Dr. Christ Kick stating that I will encourage the patient to follow up with him to discuss alternatives to home coumadin management (they do have a coumadin clinic she can go to maybe), newer anticoagulation agents, uncontrolled blood pressure (recall that she was on Labetalol 300 mg po tid prior to establishing care with me Dec 2015 but in 10/2014 she initiated contact requesting alternative to Labetalol (due to cost) so she was switched to Metoprolol 200 mg XR one a day).  I am still not confident that [name] is adhering strictly to prescribed medications.   Mrs Darene Nappi and a representative from her INR testing company have been encouraged to have her home INR machine calibrated to ensure accuracy of recently out of goal INR results. Again if she would be willing to attend INR checks at my clinic or her cardiology clinic we could ensure more accurate results and more timely communication of abnormal results and appropriate coumadin changes.  This would also decrease the chance of miscommunication.   I will re emphasize everything with [name] at our upcoming visit.

## 2015-07-28 ENCOUNTER — Encounter: Payer: Self-pay | Admitting: Family Medicine

## 2015-07-28 LAB — PROTIME-INR: INR: 2 — AB (ref 0.9–1.1)

## 2015-08-01 ENCOUNTER — Encounter: Payer: Self-pay | Admitting: Family Medicine

## 2015-08-01 ENCOUNTER — Telehealth: Payer: Self-pay | Admitting: Family Medicine

## 2015-08-01 NOTE — Telephone Encounter (Signed)
Please see my previous telephone note and documentation. INR on 07/11/15 was 1.4 although an increase from previous 1.2 I am concerned that despite instructed to take coumadin 12 mg a day her INR remains subtherapeutic. I am not sure if the home [employer] INR testing is accurate and so I strong advise [name] to follow up with her cardiologist ASAP (maybe we can secure appointment for her if she would allow) and discuss going to their INR clinic, changing to a newer anticoagulation agent. I am not comfortable increasing her INR to a higher dose as I am not sure I can verify the accuracy of her INR results without having it checked at the cardiology coumadin clinic or at my office. She is encouraged to follow up with me sooner.

## 2015-08-04 LAB — PROTIME-INR: INR: 2.2 — AB (ref 0.9–1.1)

## 2015-08-04 NOTE — Telephone Encounter (Signed)
Tried to contact this patient so that I can review Dr. Allie Dimmer message with her but there was no answer. A message was left for her to give [place] a call back when she got the chance.

## 2015-08-05 ENCOUNTER — Encounter: Payer: Self-pay | Admitting: Family Medicine

## 2015-08-05 NOTE — Telephone Encounter (Signed)
I tried to contact this patient again on her mobile number, but the call went straight to her voicemail. I did not leave a message.  I then tried to reach her on the home phone and still did not get a answer. The phone rang continuously and I was not able to leave a message.

## 2015-08-11 ENCOUNTER — Encounter: Payer: Self-pay | Admitting: Family Medicine

## 2015-08-11 ENCOUNTER — Ambulatory Visit (INDEPENDENT_AMBULATORY_CARE_PROVIDER_SITE_OTHER): Payer: PPO | Admitting: Family Medicine

## 2015-08-11 VITALS — BP 142/88 | HR 97 | Temp 97.8°F | Resp 16 | Wt 254.5 lb

## 2015-08-11 DIAGNOSIS — Z950 Presence of cardiac pacemaker: Secondary | ICD-10-CM | POA: Diagnosis not present

## 2015-08-11 DIAGNOSIS — N179 Acute kidney failure, unspecified: Secondary | ICD-10-CM | POA: Insufficient documentation

## 2015-08-11 DIAGNOSIS — M352 Behcet's disease: Secondary | ICD-10-CM | POA: Diagnosis not present

## 2015-08-11 DIAGNOSIS — N183 Chronic kidney disease, stage 3 unspecified: Secondary | ICD-10-CM | POA: Insufficient documentation

## 2015-08-11 DIAGNOSIS — Z5181 Encounter for therapeutic drug level monitoring: Secondary | ICD-10-CM

## 2015-08-11 DIAGNOSIS — I10 Essential (primary) hypertension: Secondary | ICD-10-CM

## 2015-08-11 DIAGNOSIS — Z7901 Long term (current) use of anticoagulants: Secondary | ICD-10-CM | POA: Diagnosis not present

## 2015-08-11 DIAGNOSIS — I482 Chronic atrial fibrillation, unspecified: Secondary | ICD-10-CM

## 2015-08-11 LAB — PROTIME-INR: Protime: 35.5 seconds — AB (ref 10.0–13.8)

## 2015-08-11 LAB — POCT INR
INR: 3
INR: 3 — AB (ref <=1.1)

## 2015-08-11 MED ORDER — PREDNISONE 10 MG (21) PO TBPK: ORAL_TABLET | ORAL | Status: DC

## 2015-08-11 MED ORDER — HYDROCHLOROTHIAZIDE 12.5 MG PO TABS: 12.5000 mg | ORAL_TABLET | Freq: Every day | ORAL | Status: DC

## 2015-08-11 NOTE — Patient Instructions (Addendum)
1) Coumadin 4mg  tablet by mouth 2 tablets (8mg ) on Tues, Friday and Coumadin 4mg  tablet by mouth 3 tablets (12mg ) on Mon, Wed, Thurs, Sat, Sun  2) Call Dr. Nadine Counts with name of Cardiologist   3) Talk to insurance company about reducing frequency of INR checks to every 4 weeks unless INR is not at goal 2-3.
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
## 2015-08-12 LAB — PROTIME-INR: INR: 3 — AB (ref 0.9–1.1)

## 2015-08-16 ENCOUNTER — Telehealth: Payer: Self-pay

## 2015-08-16 NOTE — Telephone Encounter (Signed)
This patient called stating that her Cardiologist is Dr. Soyla Dryer [facility]   Phone: [phone]  Fax: [phone]

## 2015-08-18 LAB — PROTIME-INR: INR: 2.4 — AB (ref 0.9–1.1)

## 2015-08-18 NOTE — Telephone Encounter (Signed)
Thank you :)

## 2015-08-26 ENCOUNTER — Encounter: Payer: Self-pay | Admitting: Family Medicine

## 2015-08-26 LAB — PROTIME-INR: INR: 2.2 — AB (ref 0.9–1.1)

## 2015-08-31 ENCOUNTER — Encounter: Payer: Self-pay | Admitting: Family Medicine

## 2015-09-01 LAB — PROTIME-INR: INR: 2.2 — AB (ref 0.9–1.1)

## 2015-09-07 ENCOUNTER — Encounter: Payer: Self-pay | Admitting: Family Medicine

## 2015-09-15 LAB — PROTIME-INR: INR: 2 — AB (ref 0.9–1.1)

## 2015-09-22 ENCOUNTER — Other Ambulatory Visit: Payer: Self-pay | Admitting: Family Medicine

## 2015-09-23 LAB — PROTIME-INR: INR: 2.2 — AB (ref 0.9–1.1)

## 2015-09-26 ENCOUNTER — Encounter: Payer: Self-pay | Admitting: Family Medicine

## 2015-09-27 ENCOUNTER — Encounter: Payer: Self-pay | Admitting: Family Medicine

## 2015-09-30 LAB — PROTIME-INR: INR: 1.8 — AB (ref 0.9–1.1)

## 2015-10-04 ENCOUNTER — Telehealth: Payer: Self-pay

## 2015-10-04 NOTE — Telephone Encounter (Signed)
I tried to contact this patient to review the message from Dr. Nadine Counts regarding this patient's low INR reading, but there was no answer and I was not able to leave a message.

## 2015-10-05 ENCOUNTER — Telehealth: Payer: Self-pay | Admitting: Family Medicine

## 2015-10-05 ENCOUNTER — Encounter: Payer: Self-pay | Admitting: Family Medicine

## 2015-10-05 NOTE — Telephone Encounter (Signed)
Pt returned your call and is asking for a call back. °

## 2015-10-05 NOTE — Telephone Encounter (Signed)
Patient was informed of Dr. Allie Dimmer message and stated that the knot on the top of her head busted and it was a lot of blood and pus that came out. She has been trying to get an appt with Dr. Nadine Counts but 1 has been rescheduled and the other times she has been booked up.   She then asked if she could come in for a nurse visit to check her INR and get a flu shot. I told her yes so she will call back tomorrow to schedule a time.

## 2015-10-08 LAB — PROTIME-INR: INR: 2.6 — AB (ref 0.9–1.1)

## 2015-10-13 ENCOUNTER — Encounter: Payer: Self-pay | Admitting: Family Medicine

## 2015-10-13 DIAGNOSIS — Z930 Tracheostomy status: Secondary | ICD-10-CM | POA: Diagnosis not present

## 2015-10-18 ENCOUNTER — Telehealth: Payer: Self-pay

## 2015-10-18 NOTE — Telephone Encounter (Signed)
At goal INR 2-3 continue with current coumadin regimen.

## 2015-10-18 NOTE — Telephone Encounter (Signed)
Received results on PT/INR  INR:2.2

## 2015-10-20 ENCOUNTER — Encounter: Payer: Self-pay | Admitting: Family Medicine

## 2015-10-28 LAB — PROTIME-INR: INR: 2.2 — AB (ref 0.9–1.1)

## 2015-10-31 ENCOUNTER — Encounter: Payer: Self-pay | Admitting: Family Medicine

## 2015-11-03 LAB — PROTIME-INR: INR: 2.4 — AB (ref 0.9–1.1)

## 2015-11-07 ENCOUNTER — Ambulatory Visit: Payer: PPO | Admitting: Family Medicine

## 2015-11-08 ENCOUNTER — Ambulatory Visit: Payer: PPO | Admitting: Family Medicine

## 2015-11-09 DIAGNOSIS — J383 Other diseases of vocal cords: Secondary | ICD-10-CM | POA: Diagnosis not present

## 2015-11-09 DIAGNOSIS — J387 Other diseases of larynx: Secondary | ICD-10-CM | POA: Diagnosis not present

## 2015-11-09 DIAGNOSIS — J384 Edema of larynx: Secondary | ICD-10-CM | POA: Diagnosis not present

## 2015-11-09 DIAGNOSIS — R490 Dysphonia: Secondary | ICD-10-CM | POA: Diagnosis not present

## 2015-11-09 DIAGNOSIS — Z430 Encounter for attention to tracheostomy: Secondary | ICD-10-CM | POA: Diagnosis not present

## 2015-11-09 DIAGNOSIS — Z930 Tracheostomy status: Secondary | ICD-10-CM | POA: Diagnosis not present

## 2015-11-10 ENCOUNTER — Telehealth: Payer: Self-pay | Admitting: Family Medicine

## 2015-11-10 ENCOUNTER — Encounter: Payer: Self-pay | Admitting: Family Medicine

## 2015-11-10 LAB — PROTIME-INR: INR: 2.3 — AB (ref 0.9–1.1)

## 2015-11-10 NOTE — Telephone Encounter (Signed)
Patient called to inform office that she is scheduled to have surgery on her trach @ [facility] on 12/01/15.  Patient will have her surgery clearance done by one of the physicians there due to her medical condition.  Patient did cancel her appointment that was scheduled with Dr. Nadine Counts on 11/25/15.   Patient also wanted to be called once the scheduling template for Dr. Sanda Klein has been complete so that she can be scheduled.  Patient has been put on the Wait List to be called.

## 2015-11-17 ENCOUNTER — Telehealth: Payer: Self-pay | Admitting: Family Medicine

## 2015-11-17 NOTE — Telephone Encounter (Signed)
Called and LVM on 11/17/15@ 10:01am informing patient that her handicap application was completed and ready for pick-up.  [name] also called and LVM on 11/16/15 informing patient of the same information.

## 2015-11-18 ENCOUNTER — Encounter: Payer: Self-pay | Admitting: Family Medicine

## 2015-11-19 LAB — PROTIME-INR: INR: 2.3 — AB (ref 0.9–1.1)

## 2015-11-21 ENCOUNTER — Encounter: Payer: Self-pay | Admitting: Family Medicine

## 2015-11-21 ENCOUNTER — Telehealth: Payer: Self-pay | Admitting: Family Medicine

## 2015-11-21 NOTE — Telephone Encounter (Signed)
Please speak with Mrs [name]. Her INR has been at goal for 2 months now and I do not feel that it needs to be checked weekly. May I call her mdINR home testing company and reduce frequency of testing to once a month? If results are out of goal INR 2-3 we can test more frequently at that time.

## 2015-11-21 NOTE — Telephone Encounter (Signed)
I have discussed case with office manager. [name] is welcome to continue coming to [facility] as our other patients do for a monthly nursing or provider visit to have her INR/PT checked and dosing adjusted accordingly. Home INR checks are no longer medically necessary. Previously Sara. Jefferson had stated that home INR checks were convenient for her however appropriate surveillance, adjustment of coumadin doses and testing of INR/PT would be best in an in office setting. This has been expressed to Sara. Jefferson several times.  [name] has chosen [place] as her primary care office due to convenience of location close to her home, traveling to office visit for PT/INR management should not be a major hurdle. Her other specialists are in [facility], she also has the option of finding a primary provider within her specialist's network as well.

## 2015-11-21 NOTE — Telephone Encounter (Signed)
Patient notified will call company and decrease to 1x month

## 2015-11-21 NOTE — Telephone Encounter (Signed)
Company states they can only do once a week per medicare guidelines.  If it is decreased to once a month we would have to cancel services.  Dr.  Nadine Counts agreed and states there is no need for patient to be testing weekly due to being at goal range. We will cancel services at this time.  They will notify patient.

## 2015-11-22 ENCOUNTER — Ambulatory Visit (INDEPENDENT_AMBULATORY_CARE_PROVIDER_SITE_OTHER): Payer: PPO | Admitting: *Deleted

## 2015-11-22 DIAGNOSIS — R001 Bradycardia, unspecified: Secondary | ICD-10-CM | POA: Diagnosis not present

## 2015-11-23 ENCOUNTER — Encounter: Payer: Self-pay | Admitting: Cardiology

## 2015-11-23 ENCOUNTER — Other Ambulatory Visit: Payer: Self-pay

## 2015-11-23 DIAGNOSIS — I10 Essential (primary) hypertension: Secondary | ICD-10-CM

## 2015-11-23 DIAGNOSIS — M25569 Pain in unspecified knee: Secondary | ICD-10-CM

## 2015-11-23 DIAGNOSIS — Z5181 Encounter for therapeutic drug level monitoring: Secondary | ICD-10-CM

## 2015-11-23 DIAGNOSIS — Z7901 Long term (current) use of anticoagulants: Principal | ICD-10-CM

## 2015-11-23 LAB — CUP PACEART REMOTE DEVICE CHECK
Battery Remaining Longevity: 111 mo
Brady Statistic AP VS Percent: 4.3 %
Brady Statistic AS VP Percent: 1 %
Brady Statistic AS VS Percent: 96 %
Brady Statistic RA Percent Paced: 4.2 %
Date Time Interrogation Session: 20170214104618
Implantable Lead Implant Date: 20070122
Implantable Lead Location: 753862
Lead Channel Impedance Value: 380 Ohm
Lead Channel Sensing Intrinsic Amplitude: 7.7 mV
Lead Channel Setting Pacing Amplitude: 2 V
Lead Channel Setting Sensing Sensitivity: 2 mV
MDC IDC LEAD IMPLANT DT: 20070122
MDC IDC LEAD LOCATION: 753862
MDC IDC LEAD SERIAL: 114150
MDC IDC MSMT BATTERY REMAINING PERCENTAGE: 91 %
MDC IDC MSMT BATTERY VOLTAGE: 2.95 V
MDC IDC MSMT LEADCHNL RA IMPEDANCE VALUE: 380 Ohm
MDC IDC MSMT LEADCHNL RA SENSING INTR AMPL: 2.4 mV
MDC IDC SET LEADCHNL RV PACING AMPLITUDE: 2.5 V
MDC IDC SET LEADCHNL RV PACING PULSEWIDTH: 0.4 ms
MDC IDC STAT BRADY AP VP PERCENT: 1 %
MDC IDC STAT BRADY RV PERCENT PACED: 1 %
Pulse Gen Serial Number: 7427529

## 2015-11-23 MED ORDER — GABAPENTIN 300 MG PO CAPS: 300.0000 mg | ORAL_CAPSULE | Freq: Three times a day (TID) | ORAL | Status: DC

## 2015-11-23 MED ORDER — PANTOPRAZOLE SODIUM 40 MG PO TBEC: 40.0000 mg | DELAYED_RELEASE_TABLET | Freq: Every day | ORAL | Status: DC

## 2015-11-23 MED ORDER — LOSARTAN POTASSIUM 100 MG PO TABS: 100.0000 mg | ORAL_TABLET | Freq: Every day | ORAL | Status: DC

## 2015-11-23 MED ORDER — WARFARIN SODIUM 4 MG PO TABS: ORAL_TABLET | ORAL | Status: DC

## 2015-11-23 NOTE — Progress Notes (Signed)
Remote pacemaker transmission.   

## 2015-11-25 ENCOUNTER — Ambulatory Visit: Payer: PPO | Admitting: Family Medicine

## 2015-12-07 ENCOUNTER — Encounter: Payer: Self-pay | Admitting: Cardiology

## 2015-12-22 DIAGNOSIS — M352 Behcet's disease: Secondary | ICD-10-CM | POA: Diagnosis not present

## 2015-12-22 DIAGNOSIS — I10 Essential (primary) hypertension: Secondary | ICD-10-CM | POA: Diagnosis not present

## 2015-12-22 DIAGNOSIS — Z8673 Personal history of transient ischemic attack (TIA), and cerebral infarction without residual deficits: Secondary | ICD-10-CM | POA: Diagnosis not present

## 2015-12-22 DIAGNOSIS — K219 Gastro-esophageal reflux disease without esophagitis: Secondary | ICD-10-CM | POA: Diagnosis not present

## 2015-12-22 DIAGNOSIS — Z7901 Long term (current) use of anticoagulants: Secondary | ICD-10-CM | POA: Diagnosis not present

## 2015-12-22 DIAGNOSIS — Z930 Tracheostomy status: Secondary | ICD-10-CM | POA: Diagnosis not present

## 2015-12-22 DIAGNOSIS — M1990 Unspecified osteoarthritis, unspecified site: Secondary | ICD-10-CM | POA: Diagnosis not present

## 2015-12-22 DIAGNOSIS — Z87891 Personal history of nicotine dependence: Secondary | ICD-10-CM | POA: Diagnosis not present

## 2015-12-22 DIAGNOSIS — Z01810 Encounter for preprocedural cardiovascular examination: Secondary | ICD-10-CM | POA: Diagnosis not present

## 2015-12-22 DIAGNOSIS — J386 Stenosis of larynx: Secondary | ICD-10-CM | POA: Diagnosis not present

## 2015-12-22 DIAGNOSIS — J449 Chronic obstructive pulmonary disease, unspecified: Secondary | ICD-10-CM | POA: Diagnosis not present

## 2015-12-22 DIAGNOSIS — Z79899 Other long term (current) drug therapy: Secondary | ICD-10-CM | POA: Diagnosis not present

## 2015-12-26 ENCOUNTER — Telehealth: Payer: Self-pay | Admitting: Family Medicine

## 2015-12-26 NOTE — Telephone Encounter (Signed)
INR report reviewed Within target range [name] -- please call patient Continue same regimen / dose of warfarin (Coumadin) It looks like Dr. Nadine Counts was having her check every 1-2 weeks, so that will be appropriate for next home INR Thank you

## 2015-12-27 DIAGNOSIS — Z930 Tracheostomy status: Secondary | ICD-10-CM | POA: Diagnosis not present

## 2015-12-27 NOTE — Telephone Encounter (Signed)
Patient notified

## 2015-12-29 DIAGNOSIS — Z930 Tracheostomy status: Secondary | ICD-10-CM | POA: Diagnosis not present

## 2015-12-29 DIAGNOSIS — J386 Stenosis of larynx: Secondary | ICD-10-CM | POA: Diagnosis not present

## 2016-01-03 LAB — PROTIME-INR: INR: 2 — AB (ref 0.9–1.1)

## 2016-01-06 ENCOUNTER — Telehealth: Payer: Self-pay | Admitting: Family Medicine

## 2016-01-06 NOTE — Telephone Encounter (Signed)
I tried calling home number; no answer; could not leave msg Please call patient Let her know that her INR was 2; within range, but trending down from last few draws Recheck in one week; thank you

## 2016-01-09 NOTE — Telephone Encounter (Signed)
I tried calling home number; no answer; could not leave msg, will try calling patient again later today.

## 2016-01-11 NOTE — Telephone Encounter (Signed)
Left voice message on patient cell phone notifying her of her INR results and to return in one week to recheck levels

## 2016-01-12 ENCOUNTER — Encounter: Payer: Self-pay | Admitting: Family Medicine

## 2016-01-18 LAB — PROTIME-INR: INR: 2.6 — AB (ref 0.9–1.1)

## 2016-01-20 ENCOUNTER — Telehealth: Payer: Self-pay | Admitting: Family Medicine

## 2016-01-20 NOTE — Telephone Encounter (Signed)
Detailed message left on cell phone, voice identified "Chaka" Continue same regimen; next INR due 02/01/16

## 2016-01-24 ENCOUNTER — Encounter: Payer: Self-pay | Admitting: Family Medicine

## 2016-01-27 LAB — PROTIME-INR: INR: 3 — AB (ref 0.9–1.1)

## 2016-01-29 ENCOUNTER — Telehealth: Payer: Self-pay | Admitting: Family Medicine

## 2016-01-29 NOTE — Telephone Encounter (Signed)
Left detailed msg INR 3, trending up; if any changes in meds (OTC even) or diet, please revert to previous habits Recheck INR tomorrow

## 2016-01-30 ENCOUNTER — Encounter: Payer: Self-pay | Admitting: Family Medicine

## 2016-02-03 LAB — PROTIME-INR: INR: 2.7 — AB (ref 0.9–1.1)

## 2016-02-09 ENCOUNTER — Encounter: Payer: Self-pay | Admitting: Family Medicine

## 2016-02-10 LAB — PROTIME-INR: INR: 2.8 — AB (ref 0.9–1.1)

## 2016-02-15 ENCOUNTER — Telehealth: Payer: Self-pay | Admitting: Family Medicine

## 2016-02-15 NOTE — Telephone Encounter (Signed)
Please ask patient to schedule an appt; she has not been seen in over 6 months Let her know INR is within target range (2.8) Continue same dose and regimen; recheck in 2 weeks

## 2016-02-15 NOTE — Telephone Encounter (Signed)
Left voicemail to call and sch. appt

## 2016-02-21 ENCOUNTER — Encounter: Payer: Self-pay | Admitting: Family Medicine

## 2016-02-22 DIAGNOSIS — Z930 Tracheostomy status: Secondary | ICD-10-CM | POA: Diagnosis not present

## 2016-02-23 LAB — PROTIME-INR: INR: 2.6 — AB (ref 0.9–1.1)

## 2016-02-24 ENCOUNTER — Telehealth: Payer: Self-pay

## 2016-02-24 NOTE — Telephone Encounter (Signed)
Pt notified coumadin normal recheck in 2 weeks

## 2016-02-27 ENCOUNTER — Encounter: Payer: Self-pay | Admitting: Family Medicine

## 2016-02-27 ENCOUNTER — Other Ambulatory Visit: Payer: Self-pay

## 2016-02-27 DIAGNOSIS — Z7901 Long term (current) use of anticoagulants: Principal | ICD-10-CM

## 2016-02-27 DIAGNOSIS — Z5181 Encounter for therapeutic drug level monitoring: Secondary | ICD-10-CM

## 2016-02-27 MED ORDER — WARFARIN SODIUM 4 MG PO TABS: ORAL_TABLET | ORAL | Status: DC

## 2016-02-29 DIAGNOSIS — Z930 Tracheostomy status: Secondary | ICD-10-CM | POA: Diagnosis not present

## 2016-02-29 DIAGNOSIS — J398 Other specified diseases of upper respiratory tract: Secondary | ICD-10-CM | POA: Diagnosis not present

## 2016-02-29 DIAGNOSIS — M352 Behcet's disease: Secondary | ICD-10-CM | POA: Diagnosis not present

## 2016-03-01 LAB — PROTIME-INR

## 2016-03-19 LAB — PROTIME-INR

## 2016-03-27 ENCOUNTER — Telehealth: Payer: Self-pay | Admitting: Family Medicine

## 2016-03-27 DIAGNOSIS — Z930 Tracheostomy status: Secondary | ICD-10-CM | POA: Diagnosis not present

## 2016-03-27 DIAGNOSIS — J9690 Respiratory failure, unspecified, unspecified whether with hypoxia or hypercapnia: Secondary | ICD-10-CM | POA: Diagnosis not present

## 2016-03-27 NOTE — Telephone Encounter (Signed)
Please ask patient to schedule an appointment in the next few weeks; I've not seen her yet Her INR is 2.2; continue same regimen of warfarin/coumadin; recheck in 2 weeks Thank you

## 2016-03-27 NOTE — Telephone Encounter (Signed)
Left voice mail

## 2016-03-28 ENCOUNTER — Encounter: Payer: Self-pay | Admitting: Family Medicine

## 2016-03-28 ENCOUNTER — Telehealth: Payer: Self-pay | Admitting: Family Medicine

## 2016-03-28 NOTE — Telephone Encounter (Signed)
INR was 2.7 on 03/19/16 Please have her continue same dose/regimen; recheck in 2 weeks I do want to see if she hasn't had appt scheduled already please

## 2016-03-28 NOTE — Telephone Encounter (Signed)
Left pt voicemial yesterday about this

## 2016-03-29 ENCOUNTER — Other Ambulatory Visit: Payer: Self-pay

## 2016-03-29 ENCOUNTER — Telehealth: Payer: Self-pay

## 2016-03-29 DIAGNOSIS — Z7901 Long term (current) use of anticoagulants: Principal | ICD-10-CM

## 2016-03-29 DIAGNOSIS — Z930 Tracheostomy status: Secondary | ICD-10-CM | POA: Diagnosis not present

## 2016-03-29 DIAGNOSIS — J9690 Respiratory failure, unspecified, unspecified whether with hypoxia or hypercapnia: Secondary | ICD-10-CM | POA: Diagnosis not present

## 2016-03-29 DIAGNOSIS — Z5181 Encounter for therapeutic drug level monitoring: Secondary | ICD-10-CM

## 2016-03-29 MED ORDER — WARFARIN SODIUM 4 MG PO TABS: ORAL_TABLET | ORAL | Status: DC

## 2016-03-29 NOTE — Telephone Encounter (Signed)
Please update the warfarin Rx to exactly what she is expecting and I'll be glad to refill it; thank you

## 2016-03-29 NOTE — Telephone Encounter (Signed)
Pt states Coumadin was written wrong! And need new rx today because she is out.  Refer back to rx from 8/16 that is how pt has been taking.  She was also notified to make an appt and states she will call back.

## 2016-03-30 ENCOUNTER — Encounter: Payer: Self-pay | Admitting: Family Medicine

## 2016-04-02 LAB — PROTIME-INR: INR: 2.6 — AB (ref 0.9–1.1)

## 2016-04-03 ENCOUNTER — Encounter: Payer: Self-pay | Admitting: Family Medicine

## 2016-04-18 ENCOUNTER — Encounter: Payer: Self-pay | Admitting: Family Medicine

## 2016-04-18 LAB — PROTIME-INR: INR: 2.3 — AB (ref 0.9–1.1)

## 2016-04-24 LAB — PROTIME-INR: INR: 2.9 — AB (ref 0.9–1.1)

## 2016-05-01 ENCOUNTER — Encounter: Payer: Self-pay | Admitting: Family Medicine

## 2016-05-02 LAB — PROTIME-INR

## 2016-05-03 ENCOUNTER — Telehealth: Payer: Self-pay | Admitting: Family Medicine

## 2016-05-03 DIAGNOSIS — Z7901 Long term (current) use of anticoagulants: Principal | ICD-10-CM

## 2016-05-03 DIAGNOSIS — Z5181 Encounter for therapeutic drug level monitoring: Secondary | ICD-10-CM

## 2016-05-03 NOTE — Telephone Encounter (Signed)
Please see June 20-22 notes; I need this patient to make an appointment please I have not seen her Her last visit was in November I cannot continue to manage her coumadin without establishing a relationship with her Please ask her to make an appointment in the next 4 weeks Her INR is slightly elevated Verify exactly how she has been taking her coumadin/warfarin and document if she has changed her diet or medicine (recent antibiotics, for example), then back to me for adjustment Thank you

## 2016-05-04 MED ORDER — WARFARIN SODIUM 4 MG PO TABS: ORAL_TABLET | ORAL | 1 refills | Status: DC

## 2016-05-04 NOTE — Telephone Encounter (Signed)
I called and left message; per phone note, patient was taking 8 mg 2 days per week and 12 mg 5 days per week for a total of 76 mg per week = 10.8 mg per day I called and left detailed message for her to reduce slightly to 10 mg daily every day; that will be 2.5 of the 4 mg pills = 10 mg Recheck INR next week I am not comfortable writing more than 30 day supply of medicine, so I have to respectfully decline her request for 120 pills

## 2016-05-04 NOTE — Telephone Encounter (Signed)
Patient is scheduled to see Dr. Sanda Klein on 06/14/16.  Patient stated that she could not come in sooner due to her specialist appointments in August.  Patient was informed that provider would not be able to manage her coumadin without being seen.  Patient understood and scheduled an appointment.  Patient takes 2 4mg  tablets on Tuesdays and Thursdays.  The rest of the week she takes 3 4mg  tablets.  Patient stated that she needs a refill with a quantity of 120 because it costs her more when she only gets a 30 day supply and she can no longer afford it that way and if she needs to take more of the warfarin if her INR is high then she will have the medication on hand per the pharmacist. Patient uses [facility] in [place].

## 2016-05-07 ENCOUNTER — Other Ambulatory Visit: Payer: Self-pay

## 2016-05-07 DIAGNOSIS — I10 Essential (primary) hypertension: Secondary | ICD-10-CM

## 2016-05-07 NOTE — Telephone Encounter (Signed)
I have also called and left mutiple voicemails about patient making an appt with no response

## 2016-05-08 MED ORDER — LOSARTAN POTASSIUM 100 MG PO TABS: 100.0000 mg | ORAL_TABLET | Freq: Every day | ORAL | 1 refills | Status: DC

## 2016-05-09 ENCOUNTER — Encounter: Payer: Self-pay | Admitting: Family Medicine

## 2016-05-25 ENCOUNTER — Emergency Department (HOSPITAL_COMMUNITY)
Admission: EM | Admit: 2016-05-25 | Discharge: 2016-05-25 | Discharge status: Against Medical Advice | Payer: PPO | Attending: Emergency Medicine | Admitting: Emergency Medicine

## 2016-05-25 ENCOUNTER — Emergency Department (HOSPITAL_COMMUNITY): Payer: PPO

## 2016-05-25 ENCOUNTER — Encounter (HOSPITAL_COMMUNITY): Payer: Self-pay | Admitting: *Deleted

## 2016-05-25 DIAGNOSIS — F17210 Nicotine dependence, cigarettes, uncomplicated: Secondary | ICD-10-CM | POA: Insufficient documentation

## 2016-05-25 DIAGNOSIS — Z8673 Personal history of transient ischemic attack (TIA), and cerebral infarction without residual deficits: Secondary | ICD-10-CM | POA: Insufficient documentation

## 2016-05-25 DIAGNOSIS — I2510 Atherosclerotic heart disease of native coronary artery without angina pectoris: Secondary | ICD-10-CM | POA: Diagnosis not present

## 2016-05-25 DIAGNOSIS — Z7901 Long term (current) use of anticoagulants: Secondary | ICD-10-CM | POA: Insufficient documentation

## 2016-05-25 DIAGNOSIS — N644 Mastodynia: Secondary | ICD-10-CM | POA: Diagnosis not present

## 2016-05-25 DIAGNOSIS — I129 Hypertensive chronic kidney disease with stage 1 through stage 4 chronic kidney disease, or unspecified chronic kidney disease: Secondary | ICD-10-CM | POA: Insufficient documentation

## 2016-05-25 DIAGNOSIS — N61 Mastitis without abscess: Secondary | ICD-10-CM | POA: Diagnosis not present

## 2016-05-25 DIAGNOSIS — Z950 Presence of cardiac pacemaker: Secondary | ICD-10-CM | POA: Insufficient documentation

## 2016-05-25 DIAGNOSIS — N183 Chronic kidney disease, stage 3 (moderate): Secondary | ICD-10-CM | POA: Diagnosis not present

## 2016-05-25 DIAGNOSIS — J9811 Atelectasis: Secondary | ICD-10-CM | POA: Diagnosis not present

## 2016-05-25 LAB — URINE MICROSCOPIC-ADD ON

## 2016-05-25 LAB — COMPREHENSIVE METABOLIC PANEL
ALT: 17 U/L (ref 14–54)
ANION GAP: 6 (ref 5–15)
AST: 27 U/L (ref 15–41)
Albumin: 3.5 g/dL (ref 3.5–5.0)
Alkaline Phosphatase: 56 U/L (ref 38–126)
BILIRUBIN TOTAL: 0.5 mg/dL (ref 0.3–1.2)
BUN: 14 mg/dL (ref 6–20)
CHLORIDE: 108 mmol/L (ref 101–111)
CO2: 26 mmol/L (ref 22–32)
Calcium: 9.6 mg/dL (ref 8.9–10.3)
Creatinine, Ser: 1.77 mg/dL — ABNORMAL HIGH (ref 0.44–1.00)
GFR calc Af Amer: 34 mL/min — ABNORMAL LOW (ref >60)
GFR, EST NON AFRICAN AMERICAN: 29 mL/min — AB (ref >60)
Glucose, Bld: 138 mg/dL — ABNORMAL HIGH (ref 65–99)
POTASSIUM: 3 mmol/L — AB (ref 3.5–5.1)
Sodium: 140 mmol/L (ref 135–145)
TOTAL PROTEIN: 7 g/dL (ref 6.5–8.1)

## 2016-05-25 LAB — CBC WITH DIFFERENTIAL/PLATELET
BASOS ABS: 0 10*3/uL (ref 0.0–0.1)
Basophils Relative: 0 %
EOS PCT: 1 %
Eosinophils Absolute: 0.1 10*3/uL (ref 0.0–0.7)
HCT: 39.3 % (ref 36.0–46.0)
HEMOGLOBIN: 12.5 g/dL (ref 12.0–15.0)
LYMPHS ABS: 2.2 10*3/uL (ref 0.7–4.0)
LYMPHS PCT: 37 %
MCH: 29.1 pg (ref 26.0–34.0)
MCHC: 31.8 g/dL (ref 30.0–36.0)
MCV: 91.4 fL (ref 78.0–100.0)
Monocytes Absolute: 0.5 10*3/uL (ref 0.1–1.0)
Monocytes Relative: 8 %
NEUTROS ABS: 3.1 10*3/uL (ref 1.7–7.7)
NEUTROS PCT: 54 %
PLATELETS: 268 10*3/uL (ref 150–400)
RBC: 4.3 MIL/uL (ref 3.87–5.11)
RDW: 13.9 % (ref 11.5–15.5)
WBC: 5.8 10*3/uL (ref 4.0–10.5)

## 2016-05-25 LAB — URINALYSIS, ROUTINE W REFLEX MICROSCOPIC
Bilirubin Urine: NEGATIVE
Glucose, UA: NEGATIVE mg/dL
Ketones, ur: NEGATIVE mg/dL
NITRITE: NEGATIVE
PROTEIN: 100 mg/dL — AB
SPECIFIC GRAVITY, URINE: 1.03 (ref 1.005–1.030)
pH: 5.5 (ref 5.0–8.0)

## 2016-05-25 MED ORDER — POTASSIUM CHLORIDE 20 MEQ/15ML (10%) PO SOLN
40.0000 meq | Freq: Once | ORAL | Status: AC
  Administered 2016-05-25: 40 meq via ORAL
  Filled 2016-05-25: qty 30

## 2016-05-25 NOTE — ED Notes (Signed)
Upon walking pt back to room I ask this pt how she is doing today, her response "im hurting that's how im doing, this is why im here" then about halfway pt states "you must not have any wheelchairs in this department" informed pt that we do have wheelchairs, pt noted to have ambulated perfectly fine on way back here, steady gait. Pt then asks this RN name, I provide it to pt, pt then proceeds to tell me that she will be reporting me along with the person up front who did not make a note about her legs hurting... Charge RN aware. Dellia Nims assistant director aware, states that pt was already rude to her out front about the wait time being so long.
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
## 2016-05-25 NOTE — ED Notes (Signed)
Pt called out asking to speak to EDP, informed pt that EDP was with another pt right now and they should be around shortly, states that they have "2 minutes to get in there" or she would be leaving

## 2016-05-25 NOTE — ED Notes (Signed)
Spoke with EDP and informed them that pt would like to speak to him.

## 2016-05-25 NOTE — ED Notes (Signed)
EDP at bedside with [place] machine

## 2016-05-25 NOTE — ED Triage Notes (Signed)
The pt has had a rt swollen breast since Wednesday  She has lumps all over her body  Her bones are aching

## 2016-05-25 NOTE — ED Notes (Signed)
Pt noted to have walked out, leaving AMA, refused to sign AMA form. Steady gait noted

## 2016-05-30 ENCOUNTER — Ambulatory Visit (INDEPENDENT_AMBULATORY_CARE_PROVIDER_SITE_OTHER): Payer: PPO | Admitting: Family Medicine

## 2016-05-30 ENCOUNTER — Encounter: Payer: Self-pay | Admitting: Family Medicine

## 2016-05-30 DIAGNOSIS — N63 Unspecified lump in breast: Secondary | ICD-10-CM | POA: Diagnosis not present

## 2016-05-30 DIAGNOSIS — H0520 Unspecified exophthalmos: Secondary | ICD-10-CM | POA: Diagnosis not present

## 2016-05-30 DIAGNOSIS — N183 Chronic kidney disease, stage 3 unspecified: Secondary | ICD-10-CM

## 2016-05-30 DIAGNOSIS — N6310 Unspecified lump in the right breast, unspecified quadrant: Secondary | ICD-10-CM

## 2016-05-30 DIAGNOSIS — I10 Essential (primary) hypertension: Secondary | ICD-10-CM

## 2016-05-30 DIAGNOSIS — E876 Hypokalemia: Secondary | ICD-10-CM | POA: Insufficient documentation

## 2016-05-30 DIAGNOSIS — Z930 Tracheostomy status: Secondary | ICD-10-CM

## 2016-05-30 DIAGNOSIS — I482 Chronic atrial fibrillation, unspecified: Secondary | ICD-10-CM

## 2016-05-30 LAB — BASIC METABOLIC PANEL WITH GFR
BUN: 17 mg/dL (ref 7–25)
CALCIUM: 9.5 mg/dL (ref 8.6–10.4)
CHLORIDE: 106 mmol/L (ref 98–110)
CO2: 26 mmol/L (ref 20–31)
CREATININE: 1.49 mg/dL — AB (ref 0.50–0.99)
GFR, Est African American: 43 mL/min — ABNORMAL LOW (ref >=60)
GFR, Est Non African American: 37 mL/min — ABNORMAL LOW (ref >=60)
GLUCOSE: 93 mg/dL (ref 65–99)
Potassium: 3.9 mmol/L (ref 3.5–5.3)
Sodium: 139 mmol/L (ref 135–146)

## 2016-05-30 LAB — MAGNESIUM: Magnesium: 2 mg/dL (ref 1.5–2.5)

## 2016-05-30 LAB — TSH: TSH: 1.04 mIU/L

## 2016-05-30 MED ORDER — OXYCODONE-ACETAMINOPHEN 10-325 MG PO TABS: 0.5000 | ORAL_TABLET | Freq: Four times a day (QID) | ORAL | 0 refills | Status: DC | PRN

## 2016-05-30 NOTE — Assessment & Plan Note (Signed)
Check TSH 
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
## 2016-05-30 NOTE — Assessment & Plan Note (Addendum)
Patient does not want a mammogram; she requests referral to surgeon for biopsy; this area is concerning for malignancy; [place] was reported to have been done in the ER, but I simply cannot locate report; perhaps it was filed in another chart (?); patient does not think she can go through a mammogram, reporting that it would be too painful and wishes to go directly to surgical consultation; referral entered

## 2016-05-30 NOTE — Assessment & Plan Note (Signed)
Check K+ and Mg2+ 

## 2016-05-30 NOTE — Patient Instructions (Signed)
We'll request ultrasound from [facility] have you see a surgeon right away We'll get labs today Drink 64-72 ounces of liquids a day Avoid NSAIDs Use the pain medicine if needed, never mix with alcohol or nerve pills or sleeping pills

## 2016-05-30 NOTE — Assessment & Plan Note (Addendum)
Continue warfarin, continue 10 mg daily; INR today with other labs; may check at home every 2 weeks

## 2016-05-31 LAB — THYROID PEROXIDASE ANTIBODY: Thyroperoxidase Ab SerPl-aCnc: 1 IU/mL (ref <9)

## 2016-05-31 LAB — PROTIME-INR
INR: 2.6 — AB
PROTHROMBIN TIME: 26.6 s — AB (ref 9.0–11.5)

## 2016-06-01 ENCOUNTER — Telehealth: Payer: Self-pay | Admitting: Emergency Medicine

## 2016-06-01 MED ORDER — TIZANIDINE HCL 2 MG PO CAPS: 4.0000 mg | ORAL_CAPSULE | Freq: Three times a day (TID) | ORAL | 1 refills | Status: DC | PRN

## 2016-06-01 NOTE — Telephone Encounter (Signed)
Patient would like for you to give her a call. Would not state what this is regarding.

## 2016-06-01 NOTE — Telephone Encounter (Signed)
I spoke with patient; we'll try tizanidine Apologized for the trouble getting through on our phones Jamie, Please call [facility] and give them patient's INR results

## 2016-06-03 NOTE — Assessment & Plan Note (Signed)
Recheck K+; it was 3 in the ER and she received replacement

## 2016-06-03 NOTE — Assessment & Plan Note (Signed)
Controlled continue medication 

## 2016-06-03 NOTE — Assessment & Plan Note (Signed)
With recent increase in creatinine to 1.77 in the hospital (stage 4); will recheck today; avoid NSAIDs; hydrate

## 2016-06-03 NOTE — Assessment & Plan Note (Signed)
In place; no obvious signs of difficulty

## 2016-06-04 NOTE — Telephone Encounter (Signed)
I tried to call in results, but they state only take readings from patients machine.  Pt notified

## 2016-06-07 LAB — PROTIME-INR: INR: 2.7 — AB (ref 0.9–1.1)

## 2016-06-12 ENCOUNTER — Other Ambulatory Visit: Payer: Self-pay | Admitting: Surgery

## 2016-06-12 ENCOUNTER — Telehealth: Payer: Self-pay | Admitting: Family Medicine

## 2016-06-12 DIAGNOSIS — N6310 Unspecified lump in the right breast, unspecified quadrant: Secondary | ICD-10-CM

## 2016-06-12 DIAGNOSIS — N63 Unspecified lump in breast: Secondary | ICD-10-CM | POA: Diagnosis not present

## 2016-06-12 NOTE — Telephone Encounter (Signed)
I spoke with patient; she is at the surgeon's, but says we need to order the breast imaging I will be glad to put that in; I personally called [place] to schedule, was transferred and put through to voicemail; message left Orders entered

## 2016-06-12 NOTE — Assessment & Plan Note (Signed)
order

## 2016-06-14 ENCOUNTER — Encounter: Payer: Self-pay | Admitting: Family Medicine

## 2016-06-14 ENCOUNTER — Ambulatory Visit: Payer: PPO | Admitting: Family Medicine

## 2016-06-15 ENCOUNTER — Ambulatory Visit
Admission: RE | Admit: 2016-06-15 | Discharge: 2016-06-15 | Disposition: A | Discharge status: Home / Self Care | Payer: PPO | Source: Ambulatory Visit | Attending: Surgery | Admitting: Surgery

## 2016-06-15 ENCOUNTER — Other Ambulatory Visit: Payer: Self-pay | Admitting: Surgery

## 2016-06-15 DIAGNOSIS — N6002 Solitary cyst of left breast: Secondary | ICD-10-CM | POA: Diagnosis not present

## 2016-06-15 DIAGNOSIS — N6489 Other specified disorders of breast: Secondary | ICD-10-CM | POA: Diagnosis not present

## 2016-06-15 DIAGNOSIS — N6310 Unspecified lump in the right breast, unspecified quadrant: Secondary | ICD-10-CM

## 2016-06-15 DIAGNOSIS — R599 Enlarged lymph nodes, unspecified: Secondary | ICD-10-CM

## 2016-06-15 DIAGNOSIS — N63 Unspecified lump in breast: Secondary | ICD-10-CM | POA: Diagnosis not present

## 2016-06-15 DIAGNOSIS — R928 Other abnormal and inconclusive findings on diagnostic imaging of breast: Secondary | ICD-10-CM | POA: Diagnosis not present

## 2016-06-15 DIAGNOSIS — R921 Mammographic calcification found on diagnostic imaging of breast: Secondary | ICD-10-CM

## 2016-06-15 DIAGNOSIS — N6320 Unspecified lump in the left breast, unspecified quadrant: Secondary | ICD-10-CM

## 2016-06-18 ENCOUNTER — Telehealth: Payer: Self-pay | Admitting: Family Medicine

## 2016-06-18 NOTE — Telephone Encounter (Signed)
I returned patient's call They are going to do four biopsies She says they tried to call our office on Friday but we were closed she says She says that they tried to reach me; she was scheduled to go back; they wanted to do their own ultrasound there; they didn't like what they saw; they wanted to get a mammogram right then They are going to do biopsies on Sept 14th and 15th She is on coumadin and told her that can be a problem The plan is to do two biopsies on Thursday and then two on Friday They told her this thing is urgent Her pain is not good right now she says She knows this is cancer she says We discussed risk of stopping coumadin, risks/benefits/alternatives; do not want a major stroke if we can apply pressure and stop local bleeding Then she says the nurse said to continue with the coumadin; that frightens her she said; she's doesn't want to bleed In [place], they always bring her in the hospital two days prior to all procedures to take care of her blood thinner; they stopped the coumadin and gave her the Lovenox shots in the stomach 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

## 2016-06-19 LAB — PROTIME-INR: INR: 3.1 — AB (ref 0.9–1.1)

## 2016-06-20 ENCOUNTER — Other Ambulatory Visit: Payer: Self-pay | Admitting: Surgery

## 2016-06-20 DIAGNOSIS — R599 Enlarged lymph nodes, unspecified: Secondary | ICD-10-CM

## 2016-06-20 DIAGNOSIS — N6310 Unspecified lump in the right breast, unspecified quadrant: Secondary | ICD-10-CM

## 2016-06-21 ENCOUNTER — Ambulatory Visit
Admission: RE | Admit: 2016-06-21 | Discharge: 2016-06-21 | Disposition: A | Discharge status: Home / Self Care | Payer: PPO | Source: Ambulatory Visit | Attending: Surgery | Admitting: Surgery

## 2016-06-21 DIAGNOSIS — C50211 Malignant neoplasm of upper-inner quadrant of right female breast: Secondary | ICD-10-CM | POA: Diagnosis not present

## 2016-06-21 DIAGNOSIS — R599 Enlarged lymph nodes, unspecified: Secondary | ICD-10-CM

## 2016-06-21 DIAGNOSIS — C50919 Malignant neoplasm of unspecified site of unspecified female breast: Secondary | ICD-10-CM

## 2016-06-21 DIAGNOSIS — N6310 Unspecified lump in the right breast, unspecified quadrant: Secondary | ICD-10-CM

## 2016-06-21 DIAGNOSIS — C773 Secondary and unspecified malignant neoplasm of axilla and upper limb lymph nodes: Secondary | ICD-10-CM | POA: Diagnosis not present

## 2016-06-21 DIAGNOSIS — R590 Localized enlarged lymph nodes: Secondary | ICD-10-CM | POA: Diagnosis not present

## 2016-06-21 DIAGNOSIS — N63 Unspecified lump in breast: Secondary | ICD-10-CM | POA: Diagnosis not present

## 2016-06-21 HISTORY — DX: Malignant neoplasm of unspecified site of unspecified female breast: C50.919

## 2016-06-22 ENCOUNTER — Ambulatory Visit
Admission: RE | Admit: 2016-06-22 | Discharge: 2016-06-22 | Disposition: A | Discharge status: Home / Self Care | Payer: PPO | Source: Ambulatory Visit | Attending: Surgery | Admitting: Surgery

## 2016-06-22 ENCOUNTER — Other Ambulatory Visit: Payer: Self-pay | Admitting: Surgery

## 2016-06-22 DIAGNOSIS — N6320 Unspecified lump in the left breast, unspecified quadrant: Secondary | ICD-10-CM

## 2016-06-22 DIAGNOSIS — Z91018 Allergy to other foods: Secondary | ICD-10-CM | POA: Diagnosis not present

## 2016-06-22 DIAGNOSIS — Z888 Allergy status to other drugs, medicaments and biological substances status: Secondary | ICD-10-CM | POA: Diagnosis not present

## 2016-06-22 DIAGNOSIS — Z8249 Family history of ischemic heart disease and other diseases of the circulatory system: Secondary | ICD-10-CM | POA: Diagnosis not present

## 2016-06-22 DIAGNOSIS — Z809 Family history of malignant neoplasm, unspecified: Secondary | ICD-10-CM | POA: Diagnosis not present

## 2016-06-22 DIAGNOSIS — Z881 Allergy status to other antibiotic agents status: Secondary | ICD-10-CM | POA: Diagnosis not present

## 2016-06-22 DIAGNOSIS — H44113 Panuveitis, bilateral: Secondary | ICD-10-CM | POA: Diagnosis not present

## 2016-06-22 DIAGNOSIS — Z79891 Long term (current) use of opiate analgesic: Secondary | ICD-10-CM | POA: Diagnosis not present

## 2016-06-22 DIAGNOSIS — Z7982 Long term (current) use of aspirin: Secondary | ICD-10-CM | POA: Diagnosis not present

## 2016-06-22 DIAGNOSIS — R921 Mammographic calcification found on diagnostic imaging of breast: Secondary | ICD-10-CM

## 2016-06-22 DIAGNOSIS — Z9181 History of falling: Secondary | ICD-10-CM | POA: Diagnosis not present

## 2016-06-22 DIAGNOSIS — H4440 Unspecified hypotony of eye: Secondary | ICD-10-CM | POA: Diagnosis not present

## 2016-06-22 DIAGNOSIS — R110 Nausea: Secondary | ICD-10-CM | POA: Diagnosis not present

## 2016-06-22 DIAGNOSIS — H2703 Aphakia, bilateral: Secondary | ICD-10-CM | POA: Diagnosis not present

## 2016-06-22 DIAGNOSIS — Z833 Family history of diabetes mellitus: Secondary | ICD-10-CM | POA: Diagnosis not present

## 2016-06-22 DIAGNOSIS — Z823 Family history of stroke: Secondary | ICD-10-CM | POA: Diagnosis not present

## 2016-06-22 DIAGNOSIS — N63 Unspecified lump in breast: Secondary | ICD-10-CM | POA: Diagnosis not present

## 2016-06-22 DIAGNOSIS — Z91048 Other nonmedicinal substance allergy status: Secondary | ICD-10-CM | POA: Diagnosis not present

## 2016-06-22 DIAGNOSIS — Z87891 Personal history of nicotine dependence: Secondary | ICD-10-CM | POA: Diagnosis not present

## 2016-06-22 DIAGNOSIS — Z79899 Other long term (current) drug therapy: Secondary | ICD-10-CM | POA: Diagnosis not present

## 2016-06-22 DIAGNOSIS — D242 Benign neoplasm of left breast: Secondary | ICD-10-CM | POA: Diagnosis not present

## 2016-06-22 DIAGNOSIS — Z9889 Other specified postprocedural states: Secondary | ICD-10-CM | POA: Diagnosis not present

## 2016-06-22 DIAGNOSIS — Z91030 Bee allergy status: Secondary | ICD-10-CM | POA: Diagnosis not present

## 2016-06-22 DIAGNOSIS — H30033 Focal chorioretinal inflammation, peripheral, bilateral: Secondary | ICD-10-CM | POA: Diagnosis not present

## 2016-06-22 DIAGNOSIS — R5383 Other fatigue: Secondary | ICD-10-CM | POA: Diagnosis not present

## 2016-06-26 ENCOUNTER — Telehealth: Payer: Self-pay | Admitting: Family Medicine

## 2016-06-27 DIAGNOSIS — Z430 Encounter for attention to tracheostomy: Secondary | ICD-10-CM | POA: Diagnosis not present

## 2016-06-27 DIAGNOSIS — Z930 Tracheostomy status: Secondary | ICD-10-CM | POA: Diagnosis not present

## 2016-06-27 NOTE — Telephone Encounter (Signed)
I called and left detailed message In regards to any biopsy results, I urged her to contact her surgeon to interpret those and discuss next steps, prognosis etc. I am of course here for her, but in regards to biopsy report, best to contact surgeon

## 2016-06-28 ENCOUNTER — Encounter: Payer: Self-pay | Admitting: Hematology and Oncology

## 2016-06-28 ENCOUNTER — Telehealth: Payer: Self-pay | Admitting: Hematology and Oncology

## 2016-06-28 LAB — PROTIME-INR: INR: 2.9 — AB (ref 0.9–1.1)

## 2016-06-28 NOTE — Telephone Encounter (Signed)
Created in error

## 2016-06-28 NOTE — Telephone Encounter (Signed)
Appointment scheduled with Dr. Lindi Adie for 9/27 at 11:15am. Left vm with appt date and time. Will mail a letter to the patient.

## 2016-06-29 ENCOUNTER — Telehealth: Payer: Self-pay | Admitting: Family Medicine

## 2016-06-29 NOTE — Telephone Encounter (Signed)
My apologies; DISREGARD the note from a few moments ago The date on the INR that was on top was from 06/19/16 The second page INR was 2.9, dated 06/28/16 --------------------------- Please call patient Confirm that she is taking 10 mg daily If so, decrease just a bit to 8 mg on Fridays and 10 mg all other days Recheck in 2 weeks Dr. Sanda Klein

## 2016-06-29 NOTE — Telephone Encounter (Signed)
INR is 3.1 Please let patient know that her Coumadin dose is just a hair too high My understanding is that she is taking 10 mg every day = 70 mg per week Please instructed her to reduce her dose just a little bit as follows: 8 mg on Mondays and Fridays (two days per week) 10 mg all other days (five days per week) New total will be 66 mg per week Recheck INR in 2 weeks

## 2016-06-29 NOTE — Telephone Encounter (Signed)
Left detailed voicemail due to being late afternoon Friday coming into weekend.

## 2016-07-01 ENCOUNTER — Other Ambulatory Visit: Payer: Self-pay | Admitting: Family Medicine

## 2016-07-01 DIAGNOSIS — Z5181 Encounter for therapeutic drug level monitoring: Secondary | ICD-10-CM

## 2016-07-01 DIAGNOSIS — Z7901 Long term (current) use of anticoagulants: Principal | ICD-10-CM

## 2016-07-02 ENCOUNTER — Encounter: Payer: Self-pay | Admitting: Radiation Oncology

## 2016-07-02 ENCOUNTER — Encounter: Payer: Self-pay | Admitting: Family Medicine

## 2016-07-02 NOTE — Progress Notes (Signed)
.   Location of Breast Cancer: Right Breast   Histology per Pathology Report:Diagnosis 06/21/2016: 1. Breast, right, needle core biopsy, UIQ - INVASIVE DUCTAL CARCINOMA WITH PAPILLARY FEATURES.- SEE COMMENT. 2. 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  Receptor Status: ER(), PR (), Her2-neu (), Ki-()  Did patient present with symptoms (if so, please note symptoms) or was this found on screening mammography?:intermittent   drainage from nipple, several years ,recent right breast pain, and enlarging of right breast; went to ED last mammogram 3 years ago  Past/Anticipated interventions by surgeon, if any:Dr. Ninfa Linden, MD seen patient 9/5/217,   Past/Anticipated interventions by medical oncology, if any: Chemotherapy : Dr. Lindi Adie ,07/05/16  Lymphedema issues, if any:  NO  Pain issues, if any:  Yes, tingling, pain in right breast   SAFETY ISSUES:  Prior radiation? Yes L4-L5 at [facility] age 80 or 45   Pacemaker/ICD? Yes,  [place]  Possible current pregnancy? NO  Is the patient on methotrexate? NO  Current Complaints / other details:Menarache age 78, G57P2,menopause [age] ,former smoker, occasional alcohol use, CHF, CVA, A-Fib, Trachesostomy HX Bechet's    Brain aneurysm  Allergies:lisinopril-cough, Eggs=rash BP 136/65 (BP Location: Left Arm, Patient Position: Sitting, Cuff Size: Normal)   Pulse 85   Temp 97.8 F (36.6 C) (Oral)   Resp 20   Ht _0  (1.702 m)   Wt 249 lb (112.9 kg)   BMI 39.00 kg/m   Wt Readings from Last 3 Encounters:  07/05/16 249 lb (112.9 kg)  05/30/16 247 lb 3.2 oz (112.1 kg)  05/25/16 240 lb (108.9 kg)   Rebecca Eaton,  RN 07/02/2016,3:43 PM

## 2016-07-04 ENCOUNTER — Encounter: Payer: Self-pay | Admitting: Genetic Counselor

## 2016-07-04 ENCOUNTER — Institutional Professional Consult (permissible substitution): Payer: PPO | Admitting: Radiation Oncology

## 2016-07-04 ENCOUNTER — Other Ambulatory Visit: Payer: Self-pay

## 2016-07-04 ENCOUNTER — Ambulatory Visit: Payer: PPO | Admitting: Hematology and Oncology

## 2016-07-04 ENCOUNTER — Telehealth: Payer: Self-pay | Admitting: *Deleted

## 2016-07-04 DIAGNOSIS — I482 Chronic atrial fibrillation: Secondary | ICD-10-CM | POA: Diagnosis not present

## 2016-07-04 LAB — PROTIME-INR: INR: 2.1 — AB (ref 0.9–1.1)

## 2016-07-04 MED ORDER — POTASSIUM CHLORIDE CRYS ER 10 MEQ PO TBCR: 10.0000 meq | EXTENDED_RELEASE_TABLET | Freq: Three times a day (TID) | ORAL | 1 refills | Status: DC

## 2016-07-04 MED ORDER — PANTOPRAZOLE SODIUM 40 MG PO TBEC: 40.0000 mg | DELAYED_RELEASE_TABLET | Freq: Every day | ORAL | 0 refills | Status: DC | PRN

## 2016-07-04 NOTE — Telephone Encounter (Signed)
Called patient home phone no answer, left vm on cell phone to bring her pacemaker card and name of Cardiologist in [facility] tomorrow when she comes for her consult 9:12 AM

## 2016-07-04 NOTE — Progress Notes (Addendum)
PERMANENT PACEMAKER PROCEDURE REPORT  [name], [name] [facility]: [id number]  Date of Procedure: 08/22/2012    [facility]    Permanent Pacemaker Procedure Report     Primary Diagnosis:Symptomatic Bradycardia  08/22/12   CPT Code:  [id number] of permanent pacemaker pulse generator with replacement of   pacemaker pulse generator; dual lead system      Procedure Description  After informed consent was obtained, the patient was brought to the   electrophysiology laboratory in the fasting state.Conscious sedation was   begun with intravenous Versed and Fentanyl.Ancef 2 gm was given   intravenously for antibacterial prophylaxis.The skin over the left chest   was prepped and draped in a sterile fashion.Sensorcaine with Epinephrine   was used as a local anesthetic.     A 6 cm. incision was made across the patient211s pre-existing pocket.The   pocket was opened with electrocautery.The pacemaker was removed from the   pocket and the leads were disconnected from the device.The existing pocket   was enlarged with blunt dissection and electrocision.    The leads were interrogated and inspected and found to have satisfactory   pacing and sensing thresholds.The pocket was copiously irrigated with   antibiotic solution containing Neosporin G.U. 19ml in 1-liter normal saline.  The leads were connected to the new device, and the device and leads were   placed in the pocket.    The wound was closed using 2-0 Vicryl sutures for the fascia.The   subcutaneous layer was closed using 3-0 Vicryl sutures.Skin closure was   accomplished using staples.A sterile bandage was placed and the patient was   then returned to her room in good condition without complication.The   estimated blood loss was 5 cc.At the conclusion of the procedure, the   sponge and needle counts were correct.    Device  Information:    New Device  ST [place] Pacemaker model # [id number] serial # [id number]  Chronic Atrial Lead  [employer] Jude Pacemaker Lead model # [id number] 46cm serial # [id number], implanted   10/29/2005   Chronic Ventricular Lead  [employer] Jude Pacemaker Lead model # [id number] 52cm serial # [id number], implanted   10/29/2005     Explanted Device  St Jude Pacemaker model # [id number] serial # [id number], implanted 10/29/2005       Results:    Total Procedure Time:1 hour 15 minutes  Total Fluoroscopy Time:0.1 minutes    Lead Measurements   AtrialR Vent   Threshold Voltage (V):0.7 1.3   Threshold Current (mA): 1.9 3.4   Impedance (ohms): 374 386   Electrogram Amplitude (mV): 2.8 7.8   Slew Rate (V/s):0.6 2.1     Final Device Settings  Mode:DDD  Base Rate:60  Upper Rate:120  Sensed AV Delay: 200  Paced AV Delay:200  Mode Switch: ON  Mode Switch Rate:150 bpm  Right Ventricular Voltage: 1.3  Right Ventricular Pulse Width: 0.4  Atrial Voltage:0.8  Atrial Pulse Width:0.4

## 2016-07-05 ENCOUNTER — Ambulatory Visit
Admission: RE | Admit: 2016-07-05 | Discharge: 2016-07-05 | Disposition: A | Discharge status: Home / Self Care | Payer: PPO | Source: Ambulatory Visit | Attending: Radiation Oncology | Admitting: Radiation Oncology

## 2016-07-05 ENCOUNTER — Ambulatory Visit (HOSPITAL_BASED_OUTPATIENT_CLINIC_OR_DEPARTMENT_OTHER): Payer: PPO | Admitting: Hematology and Oncology

## 2016-07-05 ENCOUNTER — Encounter: Payer: Self-pay | Admitting: Radiation Oncology

## 2016-07-05 ENCOUNTER — Encounter: Payer: Self-pay | Admitting: Hematology and Oncology

## 2016-07-05 VITALS — BP 136/65 | HR 85 | Temp 97.8°F | Resp 20 | Ht 67.0 in | Wt 249.0 lb

## 2016-07-05 VITALS — BP 136/65 | HR 85 | Temp 97.8°F | Resp 20 | Wt 249.0 lb

## 2016-07-05 DIAGNOSIS — F418 Other specified anxiety disorders: Secondary | ICD-10-CM | POA: Diagnosis not present

## 2016-07-05 DIAGNOSIS — C50211 Malignant neoplasm of upper-inner quadrant of right female breast: Secondary | ICD-10-CM

## 2016-07-05 DIAGNOSIS — C773 Secondary and unspecified malignant neoplasm of axilla and upper limb lymph nodes: Secondary | ICD-10-CM | POA: Diagnosis not present

## 2016-07-05 DIAGNOSIS — N6310 Unspecified lump in the right breast, unspecified quadrant: Secondary | ICD-10-CM

## 2016-07-05 DIAGNOSIS — Z170 Estrogen receptor positive status [ER+]: Secondary | ICD-10-CM | POA: Diagnosis not present

## 2016-07-05 HISTORY — DX: Malignant neoplasm of unspecified site of unspecified female breast: C50.919

## 2016-07-05 HISTORY — DX: Cerebral infarction, unspecified: I63.9

## 2016-07-05 HISTORY — DX: Chronic kidney disease, unspecified: N18.9

## 2016-07-05 HISTORY — DX: Heart failure, unspecified: I50.9

## 2016-07-05 MED ORDER — ANASTROZOLE 1 MG PO TABS: 1.0000 mg | ORAL_TABLET | Freq: Every day | ORAL | 3 refills | Status: DC

## 2016-07-05 MED ORDER — LIDOCAINE-PRILOCAINE 2.5-2.5 % EX CREA: TOPICAL_CREAM | CUTANEOUS | 3 refills | Status: DC

## 2016-07-05 MED ORDER — ONDANSETRON HCL 8 MG PO TABS: 8.0000 mg | ORAL_TABLET | Freq: Two times a day (BID) | ORAL | 1 refills | Status: DC | PRN

## 2016-07-05 NOTE — Progress Notes (Signed)
Please see the Nurse Progress Note in the MD Initial Consult Encounter for this patient. 

## 2016-07-05 NOTE — Progress Notes (Signed)
START ON PATHWAY REGIMEN - Breast  BOS174: TC - Docetaxel, Cyclophosphamide q21 Days x 4 Cycles   A cycle is every 21 days:     Docetaxel (Taxotere(R)) 75 mg/m2 in 250 mL NS IV over 1 hour, q21 days; followed by Dose Mod: None     Cyclophosphamide (Cytoxan(R)) 600 mg/m2 in 250 mL NS IV over 30 minutes, q21 days Dose Mod: None Additional Orders: Premedicate with dexamethasone 8 mg PO bid for three days beginning 1 day prior to therapy  **Always confirm dose/schedule in your pharmacy ordering system**    Patient Characteristics: Neoadjuvant Chemotherapy, HER2/neu Negative/Unknown/Equivocal AJCC Stage Grouping: Unknown Current Disease Status: No Distant Mets or Local Recurrence AJCC M Stage: X ER Status: Positive (+) AJCC N Stage: 1 AJCC T Stage: 4a HER2/neu: Negative (-) PR Status: Positive (+)  Intent of Therapy: Curative Intent, Discussed with Patient

## 2016-07-05 NOTE — Progress Notes (Signed)
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 central left breast, far posterior depth. Mammographic images were processed with CAD. On physical exam, no suspicious masses are found. Targeted ultrasound is performed, showing left breast 3 o'clock 4 cm from the nipple complicated cyst which measures 6 mm in greatest dimension. This finding likely corresponds to the mammographically seen benign-appearing nodule. IMPRESSION: No mammographic or sonographic evidence of malignancy in the left breast. RECOMMENDATION: Screening mammogram in one year.(Code:SM-B-01Y) I have discussed the findings and recommendations with the patient. Results were also provided in writing at the conclusion of the visit. If applicable, a reminder letter will be sent to the patient regarding the next appointment. BI-RADS CATEGORY  2: Benign. Electronically Signed: By: Fidela Salisbury M.D. On: 06/15/2016 17:34   Mm Lt Breast Bx W Loc Dev 1st Lesion Image Bx Spec Stereo Guide  Addendum Date: 06/25/2016   ADDENDUM REPORT: 06/25/2016 12:57 ADDENDUM: Pathology revealed a fibroadenoma and fibrocystic changes with focal usual ductal hyperplasia in the left breast. This was found to be concordant by Dr. Lillia Mountain. Pathology results were discussed with the patient by  telephone. The patient reported doing well after the biopsy. Post biopsy instructions and care were reviewed and questions were answered. The patient was encouraged to call [facility] for any additional concerns. The patient has recently been diagnosed with left breast cancer and a metastatic left axillary lymph node. Surgical consultation has been arranged with Dr. Nedra Hai at [facility] on June 28, 2016. Pathology results reported by Susa Raring RN, BSN on 06/25/2016. Electronically Signed   By: Lillia Mountain M.D.   On: 06/25/2016 12:57   Result Date: 06/25/2016 CLINICAL DATA:  Indeterminate left breast nodule. EXAM: LEFT BREAST STEREOTACTIC CORE NEEDLE BIOPSY COMPARISON:  Previous exams. FINDINGS: The patient and I discussed the procedure of stereotactic-guided  biopsy including benefits and alternatives. We discussed the high likelihood of a successful procedure. We discussed the risks of the procedure including infection, bleeding, tissue injury, clip migration, and inadequate sampling. Informed written consent was given. The usual time out protocol was performed immediately prior to the procedure. Using sterile technique and 1% lidocaine 1% lidocaine with epinephrine as local anesthetic, under stereotactic guidance, a 9 gauge vacuum assisted device was used to perform core needle biopsy of a mass in the medial aspect of the left breast using a superior to inferior approach. At the conclusion of the procedure, a X shaped tissue marker clip was deployed into the biopsy cavity. Follow-up 2-view mammogram was performed and dictated separately. IMPRESSION: Stereotactic-guided biopsy of the left breast. No apparent complications. Electronically Signed: By: Lillia Mountain M.D. On: 06/22/2016 12:33   [place] Rt Breast Bx W Loc Dev 1st Lesion Img Bx Spec US Guide  Addendum Date: 06/25/2016   ADDENDUM REPORT: 06/25/2016 12:55 ADDENDUM: Pathology revealed grade  II invasive ductal carcinoma with papillary features in the right breast. The right axillary lymph node shows metastatic carcinoma. This was found to be concordant by Dr. Curlene Dolphin. Pathology results were discussed with the patient by telephone. The patient reported doing well after the biopsies with tenderness at the sites. Post biopsy instructions and care were reviewed and questions were answered. The patient was encouraged to call [facility] for any additional concerns. Surgical consultation has been arranged with Dr. Nedra Hai at [facility] on June 28, 2016. A left breast biopsy is scheduled for June 22, 2016. There are calcifications seen in the right breast and these can be biopsied if clinically indicated. Pathology results reported by Susa Raring RN, BSN on 06/25/2016. Electronically Signed   By: Curlene Dolphin M.D.   On: 06/25/2016 12:55   Result Date: 06/25/2016 CLINICAL DATA:  65 year old patient presents with a 3 cm palpable mass in the right breast, and peau de orange of the skin. Ultrasound-guided biopsy of the palpable mass was recommended and is performed today. EXAM: ULTRASOUND GUIDED RIGHT BREAST CORE NEEDLE BIOPSY COMPARISON:  Previous exam(s). FINDINGS: I met with the patient and we discussed the procedure of ultrasound-guided biopsy, including benefits and alternatives. We discussed the high likelihood of a successful procedure. We discussed the risks of the procedure, including infection, bleeding, tissue injury, clip migration, and inadequate sampling. Informed written consent was given. The usual time-out protocol was performed immediately prior to the procedure. Using sterile technique and 1% Lidocaine as local anesthetic, under direct ultrasound visualization, a 14 gauge spring-loaded device was used to perform biopsy of approximately 3 cm palpable mass in the 2 o'clock position of the right breast 7 cm from the  nipple using a lateral to medial approach. At the conclusion of the procedure a ribbon shaped tissue marker clip was deployed into the biopsy cavity. Follow up 2 view mammogram was performed and dictated separately. IMPRESSION: Ultrasound guided biopsy of palpable right breast mass upper inner quadrant. No apparent complications. Electronically Signed: By: Curlene Dolphin M.D. On: 06/21/2016 16:05   [place] Rt Breast Bx W Loc Dev Ea Add Lesion Img Bx Spec US Guide  Addendum Date: 06/25/2016   ADDENDUM REPORT: 06/25/2016 12:55 ADDENDUM: Pathology revealed grade II invasive ductal carcinoma with papillary features in the right breast. The right axillary lymph node shows metastatic carcinoma. This was found to be concordant by Dr. Curlene Dolphin. Pathology results were discussed with the patient by  telephone. The patient reported doing well after the biopsies with tenderness at the sites. Post biopsy instructions and care were reviewed and questions were answered. The patient was encouraged to call [facility] for any additional concerns. Surgical consultation has been arranged with Dr. Nedra Hai at [facility] on June 28, 2016. A left breast biopsy is scheduled for June 22, 2016. There are calcifications seen in the right breast and these can be biopsied if clinically indicated. Pathology results reported by Susa Raring RN, BSN on 06/25/2016. Electronically Signed   By: Curlene Dolphin M.D.   On: 06/25/2016 12:55   Result Date: 06/25/2016 CLINICAL DATA:  Ultrasound-guided biopsy was recommended of a right axillary lymph node with cortical thickening. This biopsy was performed today. EXAM: ULTRASOUND GUIDED RIGHT BREAST CORE NEEDLE BIOPSY COMPARISON:  Previous exam(s). FINDINGS: I met with the patient and we discussed the procedure of ultrasound-guided biopsy, including benefits and alternatives. We discussed the high likelihood of a successful procedure. We  discussed the risks of the procedure, including infection, bleeding, tissue injury, clip migration, and inadequate sampling. Informed written consent was given. The usual time-out protocol was performed immediately prior to the procedure. Using sterile technique and 1% Lidocaine as local anesthetic, under direct ultrasound visualization, a 14 gauge spring-loaded device was used to perform biopsy of an inferior right axillary lymph node using a lateral to medial approach. At the conclusion of the procedure a HydroMARK tissue marker clip was deployed into the biopsy cavity. Follow up 2 view mammogram was performed and dictated separately. IMPRESSION: Ultrasound guided biopsy of right axillary lymph node. No apparent complications. Electronically Signed: By: Curlene Dolphin M.D. On: 06/21/2016 16:07       IMPRESSION/PLAN: 1. 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 Dr. Lisbeth Renshaw discusses the results from the patient's imaging studies and pathology. He reviews that in patients who have advanced breast cancer, recommendations would include neoadjuvant treatment prior to surgery. Typically this would be in the form of chemotherapy for those who can tolerate this, followed by interval surgical resection with mastectomy and axillary node assessment. He discusses that if this were the pathway she was recommended and chose, that he would also recommend radiotherapy given her nodal involvement. He discusses that he would recommend 33 treatments in 6 1/2 weeks.  Dr. Lisbeth Renshaw discusses the delivery and logistics of radiotherapy. We discussed the risks, benefits, short, and long term effects of radiotherapy. We will see her back about 2-3 weeks after completing surgery to move forward with the simulation and planning process and anticipate starting radiotherapy thereafter.  2. Situational anxiety. The patient admits to significant frustration, anxiety and anger of the process of meeting with  clinicians out of the order in which they will provide her care, and some miscommunication of the roles of each of the oncologists she has appointments with. We spent significant time today listening to the patient's concerns, and reassuring her that her frustrations are heard, but also explaining that we want to make sure that all of her providers have been formally introduced prior to undergoing any treatment. At the end of the discussion she seems more at ease about proceeding. We will see her as outlined above, but would be happy to speak with her regarding radiotherapy treatment at any point during her care if she has questions or concerns.  The above documentation reflects my direct findings during this shared patient visit. Please see the  separate note by Dr. Lisbeth Renshaw on this date for the remainder of the patient's plan of care.    Carola Rhine, PAC

## 2016-07-05 NOTE — Assessment & Plan Note (Addendum)
06/15/2016: Right breast irregular mass UIQ with diffuse skin thickening that is palpable, additional pleomorphic calcifications 2.8 x 2.1 x 3.5 cm extending posteriorly for another 2 cm; left breast 8 mm circumscribed nodule  06/21/2016: Right breast biopsy UIQ: IDC grade 2 with papillary features ER 100%, PR 100%, Ki-67 15%, HER-2 negative ratio 1.21; right axillary LN biopsy metastatic carcinoma in one lymph node with ECE, ER 45%, PR 0%, Ki-67 20%, HER-2 negative ratio 1.86  Clinical stage: T4 N1 stage IIIB  Pathology and radiology counseling: Discussed with the patient, the details of pathology including the type of breast cancer,the clinical staging, the significance of ER, PR and HER-2/neu receptors and the implications for treatment. After reviewing the pathology in detail, we proceeded to discuss the different treatment options between surgery, radiation, chemotherapy, antiestrogen therapies.  Recommendation: 1. Neoadjuvant chemotherapy with dose dense Adriamycin and Cytoxan 4 followed by weekly Taxol 12 2. followed by mastectomy 3. Followed by adjuvant radiation 4. Followed by antiestrogen therapy with anastrozole 10 years  Chemotherapy Counseling: I discussed the risks and benefits of chemotherapy including the risks of nausea/ vomiting, risk of infection from low WBC count, fatigue due to chemo or anemia, bruising or bleeding due to low platelets, mouth sores, loss/ change in taste and decreased appetite. Liver and kidney function will be monitored through out chemotherapy as abnormalities in liver and kidney function may be a side effect of treatment. Cardiac dysfunction due to Adriamycin was discussed in detail. Risk of permanent bone marrow dysfunction and leukemia due to chemo were also discussed.  Plan: 1. Staging CT scans and bone scan 2. port placement 3. Echocardiogram 4. Chemotherapy class  Start chemotherapy as soon as possible.
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
## 2016-07-06 ENCOUNTER — Telehealth: Payer: Self-pay | Admitting: *Deleted

## 2016-07-06 ENCOUNTER — Telehealth: Payer: Self-pay | Admitting: Hematology and Oncology

## 2016-07-06 ENCOUNTER — Other Ambulatory Visit: Payer: Self-pay | Admitting: Surgery

## 2016-07-06 ENCOUNTER — Telehealth: Payer: Self-pay | Admitting: Family Medicine

## 2016-07-06 MED ORDER — POTASSIUM CHLORIDE CRYS ER 10 MEQ PO TBCR: 10.0000 meq | EXTENDED_RELEASE_TABLET | Freq: Three times a day (TID) | ORAL | 1 refills | Status: DC

## 2016-07-06 MED ORDER — PANTOPRAZOLE SODIUM 40 MG PO TBEC: 40.0000 mg | DELAYED_RELEASE_TABLET | Freq: Every day | ORAL | 0 refills | Status: DC | PRN

## 2016-07-06 NOTE — Telephone Encounter (Signed)
Prior Auth sent to covermymeds for Zofran. Review pending.

## 2016-07-06 NOTE — Telephone Encounter (Signed)
I just resent pantoprazole and potassium Please let her know

## 2016-07-06 NOTE — Telephone Encounter (Signed)
Patient states she had called the pharmacy and they told her they did not have it. She is asking that you please resend it. She would like a return call once this has been taken care of.

## 2016-07-06 NOTE — Telephone Encounter (Signed)
Pt.notified

## 2016-07-06 NOTE — Telephone Encounter (Signed)
Per VG, cancel MRI

## 2016-07-06 NOTE — Telephone Encounter (Signed)
I just approved a 90 day supply of this medicine two days ago; please ask her to check with her pharmacy

## 2016-07-06 NOTE — Telephone Encounter (Signed)
Patient called this morning and stated that she is completely out of this prescription. She has not taken this medication this morning.

## 2016-07-06 NOTE — Telephone Encounter (Signed)
sw pt to confirm Oct appts per LOS

## 2016-07-06 NOTE — Telephone Encounter (Signed)
"  Juliann Pulse with [facility]iology calling to notify Dr. Lindi Adie we cannot schedule the MRI.  This patient has a pacemaker.  MRI cannot be done on anyone that has a pacemaker."

## 2016-07-11 ENCOUNTER — Encounter: Payer: Self-pay | Admitting: Hematology and Oncology

## 2016-07-11 LAB — PROTIME-INR: INR: 2.1 — AB (ref 0.9–1.1)

## 2016-07-11 NOTE — Progress Notes (Signed)
Patient left voicemail for me to return her call. Called patient and she states that she spoke back with her insurance. She does not have a deductible and her co-pay is $3400. She forgot to ask how much she had met. I explained to patient not to worry as long as the insurance pays their portion of her treatment, co-pay assistance is available to help with what is not covered. Patient has several concerns about her surgery on 07/18/16 and not being admitted. Advised patient when she comes in tomorrow after class, she may want to meet with the RN to express these concerns to have relayed to the doctor and maybe the doctor and surgeon could discuss further.  Applied for patient for co-pay assistance  Through PAF. Patient was approved 07/11/16-07/11/17 for a guaranteed $1650 and will be able to an additional $3350 if needed based on first-come first-serve basis without re-application. She also has a lookback date of 01/13/16. I will provide patient a copy of the approval letter and card when she comes in tomorrow and she will also receive one in the mail from PAF. Advised patient of approval for co-pay assistance. Patient has my name and number for any additional financial questions or concerns.

## 2016-07-11 NOTE — Progress Notes (Signed)
Received signed physician form for PAF co-pay assistance. Faxed to PAF. Fax received ok per confirmation sheet. Emailed copy of approval letter and POE to Emily/Nicole in billing. Copy placed to be scanned by HIM in to chart.

## 2016-07-11 NOTE — Progress Notes (Signed)
Called patient to introduce myself as Estate manager/land agent and to see if she had any financial questions or concerns. Patient states she wasn't sure how much all of this would cost and was told she would have to pay a lot of money up front. She states she spoke with her insurance man this morning and was told what her deductible was. She wasn't told how much she had met and what her OOP amount was. Advised patient that she may want to contact him again to find out these amounts. Patient states she doesn't think she has met much due to recent diagnosis. Advised patient of the co-pay assistance program that may be available to her such as PAF and how it helps with what insurance doesn't cover for treatment drugs. Patient also had a concern with transportation. Advised patient to bring proof of income tomorrow so that she can apply for the [employer] which will help with this. Patient asked the names of drugs so that she can find out what tier they are in. Also advised patient that the [employer] can assist with her portion of OOP medications. Patient was advised to see me tomorrow after chemo ed class and was given my contact name and number.

## 2016-07-12 ENCOUNTER — Encounter: Payer: Self-pay | Admitting: Family Medicine

## 2016-07-12 ENCOUNTER — Other Ambulatory Visit: Payer: PPO

## 2016-07-12 ENCOUNTER — Encounter: Payer: Self-pay | Admitting: Hematology and Oncology

## 2016-07-12 ENCOUNTER — Encounter: Payer: Self-pay | Admitting: *Deleted

## 2016-07-12 NOTE — Progress Notes (Signed)
Met with patient in my office whom had several financial questions or concerns. Spent an hour with patient providing support with her financial concerns and questions.  Gave patient a copy of her approval for PAF co-pay assistance and explained that this would help with her portion of the treatment. Patient needed her Healthcare POA completed and notarized. Her visitor was listed and was able to go to the lobby and get two witnesses. [name] notarized for her. A copy was scanned under documents. Made copies for the patient. Patient brought proof of income for the [employer]. Approved patient for one-time $1000 grant. Gave patient a copy of the award letter as well as the expense sheet it covers. Advised patient that this grant will help cover her OOP oral medication that insurance leaves her with a balance. Patient also wanted another card given in chemo ed class. Chemo ed RN Aleta gave me additional cards for the patient. Provided the patient with an additional card and her visitor has one. Patient also had several concerns about medications and port placement. Attempted to reach desk RN but was unsuccessful. Gave patient number to RN desk. Patient had questions about wigs. Gave patient a referral form for the [employer] to complete and bring back to be faxed. Patient had questions about assistance once Radiation starts which will be everyday. Advised patient that Meredith/Cindy are the advocates for Radiation and would be able to further assist with those questions once she starts Radiation. Patient and visitor have my card for any additional financial questions or concerns.

## 2016-07-13 ENCOUNTER — Telehealth: Payer: Self-pay | Admitting: *Deleted

## 2016-07-13 ENCOUNTER — Other Ambulatory Visit: Payer: Self-pay | Admitting: *Deleted

## 2016-07-13 DIAGNOSIS — C50211 Malignant neoplasm of upper-inner quadrant of right female breast: Secondary | ICD-10-CM

## 2016-07-13 NOTE — Telephone Encounter (Signed)
Called pt to give navigation resources and assess needs.  Pt relate she has some concerns regarding sx and pre-op as well as why she was not placed on a bridge after being told to stop coumadin. Informed pt that I would reach out to Dr. Trevor Mace office for assistance with her questions. Called [facility] pre-admission  To discuss pt concerns, informed they would call pt to schedule appt for 07/16/16. Spoke to Dr. Trevor Mace nurse about pt concern and request return call from Dr. Ninfa Linden

## 2016-07-16 ENCOUNTER — Ambulatory Visit (HOSPITAL_COMMUNITY)
Admission: RE | Admit: 2016-07-16 | Discharge: 2016-07-16 | Disposition: A | Discharge status: Home / Self Care | Payer: PPO | Source: Ambulatory Visit | Attending: Hematology and Oncology | Admitting: Hematology and Oncology

## 2016-07-16 ENCOUNTER — Encounter (HOSPITAL_COMMUNITY): Payer: Self-pay

## 2016-07-16 ENCOUNTER — Encounter: Payer: Self-pay | Admitting: *Deleted

## 2016-07-16 ENCOUNTER — Encounter (HOSPITAL_COMMUNITY)
Admission: RE | Admit: 2016-07-16 | Discharge: 2016-07-16 | Disposition: A | Discharge status: Home / Self Care | Payer: PPO | Source: Ambulatory Visit | Attending: Surgery | Admitting: Surgery

## 2016-07-16 DIAGNOSIS — I700 Atherosclerosis of aorta: Secondary | ICD-10-CM | POA: Insufficient documentation

## 2016-07-16 DIAGNOSIS — C773 Secondary and unspecified malignant neoplasm of axilla and upper limb lymph nodes: Secondary | ICD-10-CM | POA: Insufficient documentation

## 2016-07-16 DIAGNOSIS — C7951 Secondary malignant neoplasm of bone: Secondary | ICD-10-CM | POA: Insufficient documentation

## 2016-07-16 DIAGNOSIS — C50911 Malignant neoplasm of unspecified site of right female breast: Secondary | ICD-10-CM | POA: Diagnosis not present

## 2016-07-16 DIAGNOSIS — Z930 Tracheostomy status: Secondary | ICD-10-CM | POA: Insufficient documentation

## 2016-07-16 DIAGNOSIS — Z90710 Acquired absence of both cervix and uterus: Secondary | ICD-10-CM | POA: Insufficient documentation

## 2016-07-16 DIAGNOSIS — E278 Other specified disorders of adrenal gland: Secondary | ICD-10-CM | POA: Insufficient documentation

## 2016-07-16 DIAGNOSIS — C50211 Malignant neoplasm of upper-inner quadrant of right female breast: Secondary | ICD-10-CM | POA: Insufficient documentation

## 2016-07-16 HISTORY — DX: Presence of cardiac pacemaker: Z95.0

## 2016-07-16 HISTORY — DX: Acute myocardial infarction, unspecified: I21.9

## 2016-07-16 HISTORY — DX: Gastro-esophageal reflux disease without esophagitis: K21.9

## 2016-07-16 HISTORY — DX: Other reaction to spinal and lumbar puncture: G97.1

## 2016-07-16 HISTORY — DX: Other specified postprocedural states: R11.2

## 2016-07-16 HISTORY — DX: Other specified postprocedural states: Z98.890

## 2016-07-16 HISTORY — DX: Failed or difficult intubation, initial encounter: T88.4XXA

## 2016-07-16 LAB — BASIC METABOLIC PANEL
ANION GAP: 7 (ref 5–15)
BUN: 14 mg/dL (ref 6–20)
CALCIUM: 9.6 mg/dL (ref 8.9–10.3)
CO2: 26 mmol/L (ref 22–32)
Chloride: 108 mmol/L (ref 101–111)
Creatinine, Ser: 1.32 mg/dL — ABNORMAL HIGH (ref 0.44–1.00)
GFR calc Af Amer: 49 mL/min — ABNORMAL LOW (ref >60)
GFR calc non Af Amer: 42 mL/min — ABNORMAL LOW (ref >60)
GLUCOSE: 105 mg/dL — AB (ref 65–99)
Potassium: 3.8 mmol/L (ref 3.5–5.1)
Sodium: 141 mmol/L (ref 135–145)

## 2016-07-16 LAB — PROTIME-INR
INR: 1.28
PROTHROMBIN TIME: 16.1 s — AB (ref 11.4–15.2)

## 2016-07-16 LAB — CBC
HCT: 37.9 % (ref 36.0–46.0)
HEMOGLOBIN: 12 g/dL (ref 12.0–15.0)
MCH: 28.7 pg (ref 26.0–34.0)
MCHC: 31.7 g/dL (ref 30.0–36.0)
MCV: 90.7 fL (ref 78.0–100.0)
Platelets: 266 10*3/uL (ref 150–400)
RBC: 4.18 MIL/uL (ref 3.87–5.11)
RDW: 13.4 % (ref 11.5–15.5)
WBC: 5.8 10*3/uL (ref 4.0–10.5)

## 2016-07-16 LAB — GLUCOSE, CAPILLARY: Glucose-Capillary: 111 mg/dL — ABNORMAL HIGH (ref 65–99)

## 2016-07-16 MED ORDER — FLUDEOXYGLUCOSE F - 18 (FDG) INJECTION
12.4100 | Freq: Once | INTRAVENOUS | Status: AC | PRN
  Administered 2016-07-16: 12.41 via INTRAVENOUS

## 2016-07-16 NOTE — Progress Notes (Signed)
Pt denies any acute cardiopulmonary issues. Pt is under the care of Dr. Rosita Fire, Cardiology, at [facility] for ICD faxed to cardiologist. Pt chart forwarded to anesthesia to review cardiac history, permanent tracheostomy, anesthesia complications of difficult intubation/extubation and pending echo.

## 2016-07-16 NOTE — Pre-Procedure Instructions (Signed)
    [name]  07/16/2016      [id number] - [place], [place] - [address] [address] Phone: [phone] Fax: [phone]  [id number] [name] [address] [place] [address] Phone: [phone] Fax: [phone]    Your procedure is scheduled on Wednesday, July 18, 2016  Report to [facility] Admitting at 11:00 A.M.  Call this number if you have problems the morning of surgery:  [phone]   Remember: Follow doctors instructions regarding warfarin (COUMADIN).  Do not eat food or drink liquids after midnight Tuesday, July 17, 2016  Take these medicines the morning of surgery with A SIP OF WATER : anastrozole (ARIMIDEX), gabapentin (NEURONTIN), if needed: pantoprazole (PROTONIX) for acid reflux, Oxycodone for pain  Stop taking Aspirin, vitamins, fish oil and herbal medications. Do not take any NSAIDs ie: Ibuprofen, Advil, Naproxen, BC and Goody Powder or any medication containing Aspirin; stop now.  Do not wear jewelry, make-up or nail polish.  Do not wear lotions, powders, or perfumes, or deoderant.  Do not shave 48 hours prior to surgery.   Do not bring valuables to the hospital.  [facility] is not responsible for any belongings or valuables.  Contacts, dentures or bridgework may not be worn into surgery.  Leave your suitcase in the car.  After surgery it may be brought to your room. Patients discharged the day of surgery will not be allowed to drive home.  Special instructions:  Please read over the following fact sheets that you were given. Pain Booklet, Coughing and Deep Breathing and Surgical Site Infection Prevention

## 2016-07-16 NOTE — Progress Notes (Signed)
Pt stated that last dose of Coumadin was Wednesday per Dr. Ninfa Linden.

## 2016-07-17 ENCOUNTER — Telehealth: Payer: Self-pay | Admitting: *Deleted

## 2016-07-17 MED ORDER — PROCHLORPERAZINE MALEATE 10 MG PO TABS: 10.0000 mg | ORAL_TABLET | Freq: Four times a day (QID) | ORAL | 3 refills | Status: DC | PRN

## 2016-07-17 NOTE — H&P (Signed)
[name]  Location: [facility] Surgery Patient #: [id number] DOB: [birth year] Widowed / Language: Cleophus Molt / Race: Black or African American Female   History of Present Illness  Patient words: New-R breast mass.  The patient is a 65 year old female who presents with a breast mass. This patient is referred by Dr. Bobetta Lime for evaluation of a right breast mass. She has multiple chronic medical problems and has a tracheostomy in place. She reports she has had intermittent drainage from her nipple for several years on the right side. It has been several years since her last mammogram. She reports increasing discomfort and pain in the right breast over the past several weeks. She reports enlarging of the size of the breast as well.   Other Problems Malachi Bonds, CMA;  PM) Arthritis Back Pain Bladder Problems Congestive Heart Failure High blood pressure Oophorectomy  Past Surgical History Malachi Bonds, CMA;  Foot Surgery Bilateral. Hysterectomy (not due to cancer) - Complete  Diagnostic Studies History Malachi Bonds, CMA;  Colonoscopy 1-5 years ago Mammogram >3 years ago Pap Smear 1-5 years ago  Medication History (Chemira Jones, CMA;  PM) Gabapentin (300MG  Capsule, Oral) Active. HydroCHLOROthiazide (12.5MG  Tablet, Oral) Active. Klor-Con M10 (10MEQ Tablet ER, Oral) Active. Losartan Potassium (100MG  Tablet, Oral) Active. Pantoprazole Sodium (40MG  Tablet DR, Oral) Active. Warfarin Sodium (4MG  Tablet, Oral) Active. Vitamin D (Cholecalciferol) (1000UNIT Capsule, Oral) Active. Oxycodone-Acetaminophen (10-325MG  Tablet, Oral) Active. Latanoprost (0.005% Solution, Ophthalmic) Active. Medications Reconciled  Social History Malachi Bonds, CMA;  Alcohol use Occasional alcohol use. Caffeine use Carbonated beverages, Coffee, Tea. No drug use Tobacco use Former smoker.  Family History Malachi Bonds, CMA;  Arthritis Brother, Father,  Mother, Sister. Heart Disease Father, Mother. Hypertension Father, Mother.  Pregnancy / Birth History Malachi Bonds, CMA;  Age at menarche 52 years. Age of menopause [age] Gravida 2 Length (months) of breastfeeding 3-6 Maternal age 45-30 Para 2    Review of Systems Malachi Bonds CMA; General Not Present- Appetite Loss, Chills, Fatigue, Fever, Night Sweats, Weight Gain and Weight Loss. Skin Not Present- Change in Wart/Mole, Dryness, Hives, Jaundice, New Lesions, Non-Healing Wounds, Rash and Ulcer. HEENT Present- Wears glasses/contact lenses. Not Present- Earache, Hearing Loss, Hoarseness, Nose Bleed, Oral Ulcers, Ringing in the Ears, Seasonal Allergies, Sinus Pain, Sore Throat, Visual Disturbances and Yellow Eyes. Respiratory Not Present- Bloody sputum, Chronic Cough, Difficulty Breathing, Snoring and Wheezing. Breast Present- Breast Mass and Breast Pain. Not Present- Nipple Discharge and Skin Changes. Cardiovascular Not Present- Chest Pain, Difficulty Breathing Lying Down, Leg Cramps, Palpitations, Rapid Heart Rate, Shortness of Breath and Swelling of Extremities. Gastrointestinal Not Present- Abdominal Pain, Bloating, Bloody Stool, Change in Bowel Habits, Chronic diarrhea, Constipation, Difficulty Swallowing, Excessive gas, Gets full quickly at meals, Hemorrhoids, Indigestion, Nausea, Rectal Pain and Vomiting. Female Genitourinary Present- Frequency and Urgency. Not Present- Nocturia, Painful Urination and Pelvic Pain. Musculoskeletal Present- Joint Pain and Joint Stiffness. Not Present- Back Pain, Muscle Pain, Muscle Weakness and Swelling of Extremities. Neurological Not Present- Decreased Memory, Fainting, Headaches, Numbness, Seizures, Tingling, Tremor, Trouble walking and Weakness. Psychiatric Not Present- Anxiety, Bipolar, Change in Sleep Pattern, Depression, Fearful and Frequent crying. Endocrine Present- Hot flashes. Not Present- Cold Intolerance, Excessive Hunger, Hair  Changes, Heat Intolerance and New Diabetes. Hematology Present- Blood Thinners. Not Present- Easy Bruising, Excessive bleeding, Gland problems, HIV and Persistent Infections.  Vitals   Weight: 249.2 lb Height: 68in Body Surface Area: 2.24 m Body Mass Index: 37.89 kg/m  Temp.: 98.85F(Oral)  Pulse: 76 (Regular)  BP: 136/76 (Sitting, Left Arm, Standard)   Physical Exam  General Mental Status-Alert. General Appearance-Consistent with stated age. Hydration-Well hydrated. Voice-Normal.  Head and Neck Head-normocephalic, atraumatic with no lesions or palpable masses. Trachea-midline. Note: Tracheostomy in place Thyroid Gland Characteristics - normal size and consistency.  Eye Eyeball - Bilateral-Extraocular movements intact. Sclera/Conjunctiva - Bilateral-No scleral icterus.  Chest and Lung Exam Chest and lung exam reveals -quiet, even and easy respiratory effort with no use of accessory muscles and on auscultation, normal breath sounds, no adventitious sounds and normal vocal resonance. Inspection Chest Wall - Normal. Back - normal.  Breast Breast - Left-Symmetric, Non Tender, No Biopsy scars, no Dimpling, No Inflammation, No Lumpectomy scars, No Mastectomy scars, No Peau d' Orange. Breast - Right-Tender, Dimpling and Inflamed, No Biopsy scars, No Lumpectomy scars, No Mastectomy scars, No Peau d' Orange. Note: There is swelling and tenderness diffusely of the right breast around the areola with nipple retraction. It has the feeling of a very large mass in the breast. There is no open wounds and no drainage currently from the nipple. Breast Lump-No Palpable Breast Mass.  Cardiovascular Cardiovascular examination reveals -normal heart sounds, regular rate and rhythm with no murmurs and normal pedal pulses bilaterally.  Abdomen Inspection Inspection of the abdomen reveals - No Hernias. Skin - Scar - no surgical  scars. Palpation/Percussion Palpation and Percussion of the abdomen reveal - Soft, Non Tender, No Rebound tenderness, No Rigidity (guarding) and No hepatosplenomegaly. Auscultation Auscultation of the abdomen reveals - Bowel sounds normal.  Neurologic Neurologic evaluation reveals -alert and oriented x 3 with no impairment of recent or remote memory. Mental Status-Normal.  Musculoskeletal Normal Exam - Left-Upper Extremity Strength Normal and Lower Extremity Strength Normal. Normal Exam - Right-Upper Extremity Strength Normal and Lower Extremity Strength Normal.  Lymphatic Head & Neck  General Head & Neck Lymphatics: Bilateral - Description - Normal. Axillary  General Axillary Region: Bilateral - Description - Normal. Tenderness - Non Tender. Femoral & Inguinal  Generalized Femoral & Inguinal Lymphatics: Bilateral - Description - Normal. Tenderness - Non Tender.    Assessment & Plan  BREAST MASS, RIGHT (N63) Impression: Large right breast mass. It is difficult to tell whether this is all just secondary to mastitis or no underlying abscess or this could represent an inflammatory cancer. She needs mammograms and an ultrasound of the breast to help with the diagnosis. I explained this to her in detail. We will schedule this for ASAP.  Addendum: The mammograms were worrisome for inflammatory right breast cancer. Stereotactic biopsy is performed on the right breast and right axilla confirming right breast cancer with nodal metastases. She was discussed in our multidisciplinary breast conference. Adjuvant chemotherapy has been recommended. She is now being scheduled for Port-A-Cath insertion. I discussed this with her in detail. I discussed the risks with her. These include but are not limited to bleeding, infection, pneumothorax, need for further procedures or surgery, etc. She will be stopping her Coumadin preoperatively. The port will be left accessed and she will start  chemotherapy the very next day.

## 2016-07-17 NOTE — Telephone Encounter (Signed)
Received ECHO pre-auth - approved [id number] 10/10- 01/13/17

## 2016-07-17 NOTE — Telephone Encounter (Signed)
Discussed echo appt for 10/11 at 10am. Reviewed anti-nausea medications and gave instructions and directions. Denies further needs at this time. Encourage pt to call with questions or concerns. Received verbal understanding.
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
## 2016-07-18 ENCOUNTER — Observation Stay (HOSPITAL_COMMUNITY): Payer: PPO

## 2016-07-18 ENCOUNTER — Ambulatory Visit (HOSPITAL_COMMUNITY): Payer: PPO | Admitting: Certified Registered Nurse Anesthetist

## 2016-07-18 ENCOUNTER — Encounter (HOSPITAL_COMMUNITY): Payer: Self-pay | Admitting: Urology

## 2016-07-18 ENCOUNTER — Ambulatory Visit (HOSPITAL_BASED_OUTPATIENT_CLINIC_OR_DEPARTMENT_OTHER)
Admission: RE | Admit: 2016-07-18 | Discharge: 2016-07-18 | Disposition: A | Discharge status: Home / Self Care | Payer: PPO | Source: Ambulatory Visit | Attending: Hematology and Oncology | Admitting: Hematology and Oncology

## 2016-07-18 ENCOUNTER — Observation Stay (HOSPITAL_COMMUNITY)
Admission: RE | Admit: 2016-07-18 | Discharge: 2016-07-19 | Disposition: A | Discharge status: Home / Self Care | Payer: PPO | Source: Ambulatory Visit | Attending: Surgery | Admitting: Surgery

## 2016-07-18 ENCOUNTER — Ambulatory Visit (HOSPITAL_COMMUNITY): Payer: PPO

## 2016-07-18 ENCOUNTER — Encounter (HOSPITAL_COMMUNITY)
Admission: RE | Disposition: A | Discharge status: Home / Self Care | Payer: Self-pay | Source: Ambulatory Visit | Attending: Surgery

## 2016-07-18 ENCOUNTER — Ambulatory Visit (HOSPITAL_COMMUNITY): Payer: PPO | Admitting: Vascular Surgery

## 2016-07-18 DIAGNOSIS — Z8249 Family history of ischemic heart disease and other diseases of the circulatory system: Secondary | ICD-10-CM | POA: Insufficient documentation

## 2016-07-18 DIAGNOSIS — I739 Peripheral vascular disease, unspecified: Secondary | ICD-10-CM | POA: Insufficient documentation

## 2016-07-18 DIAGNOSIS — Z7901 Long term (current) use of anticoagulants: Secondary | ICD-10-CM | POA: Insufficient documentation

## 2016-07-18 DIAGNOSIS — I2510 Atherosclerotic heart disease of native coronary artery without angina pectoris: Secondary | ICD-10-CM | POA: Insufficient documentation

## 2016-07-18 DIAGNOSIS — Z950 Presence of cardiac pacemaker: Secondary | ICD-10-CM | POA: Insufficient documentation

## 2016-07-18 DIAGNOSIS — C50211 Malignant neoplasm of upper-inner quadrant of right female breast: Secondary | ICD-10-CM | POA: Diagnosis not present

## 2016-07-18 DIAGNOSIS — M1990 Unspecified osteoarthritis, unspecified site: Secondary | ICD-10-CM | POA: Insufficient documentation

## 2016-07-18 DIAGNOSIS — G4730 Sleep apnea, unspecified: Secondary | ICD-10-CM | POA: Insufficient documentation

## 2016-07-18 DIAGNOSIS — I110 Hypertensive heart disease with heart failure: Secondary | ICD-10-CM | POA: Insufficient documentation

## 2016-07-18 DIAGNOSIS — C50911 Malignant neoplasm of unspecified site of right female breast: Secondary | ICD-10-CM | POA: Diagnosis not present

## 2016-07-18 DIAGNOSIS — Z90710 Acquired absence of both cervix and uterus: Secondary | ICD-10-CM | POA: Diagnosis not present

## 2016-07-18 DIAGNOSIS — C50919 Malignant neoplasm of unspecified site of unspecified female breast: Secondary | ICD-10-CM

## 2016-07-18 DIAGNOSIS — I351 Nonrheumatic aortic (valve) insufficiency: Secondary | ICD-10-CM | POA: Insufficient documentation

## 2016-07-18 DIAGNOSIS — Z6841 Body Mass Index (BMI) 40.0 and over, adult: Secondary | ICD-10-CM | POA: Insufficient documentation

## 2016-07-18 DIAGNOSIS — Z853 Personal history of malignant neoplasm of breast: Secondary | ICD-10-CM

## 2016-07-18 DIAGNOSIS — I509 Heart failure, unspecified: Secondary | ICD-10-CM | POA: Diagnosis not present

## 2016-07-18 DIAGNOSIS — K219 Gastro-esophageal reflux disease without esophagitis: Secondary | ICD-10-CM | POA: Diagnosis not present

## 2016-07-18 DIAGNOSIS — I252 Old myocardial infarction: Secondary | ICD-10-CM | POA: Insufficient documentation

## 2016-07-18 DIAGNOSIS — Z452 Encounter for adjustment and management of vascular access device: Secondary | ICD-10-CM

## 2016-07-18 DIAGNOSIS — Z87891 Personal history of nicotine dependence: Secondary | ICD-10-CM | POA: Diagnosis not present

## 2016-07-18 DIAGNOSIS — I482 Chronic atrial fibrillation: Secondary | ICD-10-CM | POA: Diagnosis not present

## 2016-07-18 HISTORY — PX: PORT A CATH INJECTION (ARMC HX): HXRAD1731

## 2016-07-18 HISTORY — PX: PORTACATH PLACEMENT: SHX2246

## 2016-07-18 SURGERY — INSERTION, TUNNELED CENTRAL VENOUS DEVICE, WITH PORT
Anesthesia: General | Site: Chest | Laterality: Right

## 2016-07-18 MED ORDER — ACETAMINOPHEN 325 MG PO TABS: 650.0000 mg | ORAL_TABLET | Freq: Four times a day (QID) | ORAL | Status: DC | PRN

## 2016-07-18 MED ORDER — FENTANYL CITRATE (PF) 100 MCG/2ML IJ SOLN: 25.0000 ug | INTRAMUSCULAR | Status: DC | PRN

## 2016-07-18 MED ORDER — SODIUM CHLORIDE 0.9 % IV SOLN
INTRAVENOUS | Status: DC
  Administered 2016-07-18: 18:00:00 via INTRAVENOUS

## 2016-07-18 MED ORDER — WARFARIN - PHYSICIAN DOSING INPATIENT
Freq: Every day | Status: DC
  Administered 2016-07-18: 18:00:00

## 2016-07-18 MED ORDER — LOSARTAN POTASSIUM 50 MG PO TABS
100.0000 mg | ORAL_TABLET | Freq: Every day | ORAL | Status: DC
  Administered 2016-07-18 – 2016-07-19 (×2): 100 mg via ORAL
  Filled 2016-07-18 (×2): qty 2

## 2016-07-18 MED ORDER — HYDROCHLOROTHIAZIDE 25 MG PO TABS
12.5000 mg | ORAL_TABLET | Freq: Every day | ORAL | Status: DC
Start: 2016-07-18 — End: 2016-07-19
  Administered 2016-07-18 – 2016-07-19 (×2): 12.5 mg via ORAL
  Filled 2016-07-18 (×2): qty 1

## 2016-07-18 MED ORDER — HEPARIN SOD (PORK) LOCK FLUSH 100 UNIT/ML IV SOLN: INTRAVENOUS | Status: DC | PRN

## 2016-07-18 MED ORDER — CEFAZOLIN SODIUM-DEXTROSE 2-4 GM/100ML-% IV SOLN
2.0000 g | INTRAVENOUS | Status: AC
  Administered 2016-07-18: 2 g via INTRAVENOUS
  Filled 2016-07-18: qty 100

## 2016-07-18 MED ORDER — BUPIVACAINE HCL (PF) 0.25 % IJ SOLN
INTRAMUSCULAR | Status: DC | PRN
  Administered 2016-07-18: 7 mL

## 2016-07-18 MED ORDER — ONDANSETRON 4 MG PO TBDP: 4.0000 mg | ORAL_TABLET | Freq: Four times a day (QID) | ORAL | Status: DC | PRN

## 2016-07-18 MED ORDER — FENTANYL CITRATE (PF) 100 MCG/2ML IJ SOLN
INTRAMUSCULAR | Status: AC
  Filled 2016-07-18: qty 2

## 2016-07-18 MED ORDER — FENTANYL CITRATE (PF) 100 MCG/2ML IJ SOLN
INTRAMUSCULAR | Status: DC | PRN
  Administered 2016-07-18 (×2): 50 ug via INTRAVENOUS

## 2016-07-18 MED ORDER — PROPOFOL 10 MG/ML IV BOLUS
INTRAVENOUS | Status: AC
  Filled 2016-07-18: qty 20

## 2016-07-18 MED ORDER — 0.9 % SODIUM CHLORIDE (POUR BTL) OPTIME
TOPICAL | Status: DC | PRN
  Administered 2016-07-18: 1000 mL

## 2016-07-18 MED ORDER — OXYCODONE HCL 5 MG PO TABS: 5.0000 mg | ORAL_TABLET | ORAL | Status: DC | PRN

## 2016-07-18 MED ORDER — LACTATED RINGERS IV SOLN
INTRAVENOUS | Status: DC
  Administered 2016-07-18 (×2): via INTRAVENOUS

## 2016-07-18 MED ORDER — GABAPENTIN 300 MG PO CAPS
300.0000 mg | ORAL_CAPSULE | Freq: Three times a day (TID) | ORAL | Status: DC
  Administered 2016-07-18 – 2016-07-19 (×3): 300 mg via ORAL
  Filled 2016-07-18 (×3): qty 1

## 2016-07-18 MED ORDER — MIDAZOLAM HCL 5 MG/5ML IJ SOLN
INTRAMUSCULAR | Status: DC | PRN
  Administered 2016-07-18: 2 mg via INTRAVENOUS

## 2016-07-18 MED ORDER — ONDANSETRON HCL 4 MG/2ML IJ SOLN: 4.0000 mg | Freq: Four times a day (QID) | INTRAMUSCULAR | Status: DC | PRN

## 2016-07-18 MED ORDER — HEPARIN SOD (PORK) LOCK FLUSH 100 UNIT/ML IV SOLN
INTRAVENOUS | Status: AC
  Filled 2016-07-18: qty 5

## 2016-07-18 MED ORDER — ENOXAPARIN SODIUM 40 MG/0.4ML ~~LOC~~ SOLN: 40.0000 mg | Freq: Two times a day (BID) | SUBCUTANEOUS | Status: DC

## 2016-07-18 MED ORDER — SODIUM CHLORIDE 0.9 % IV SOLN
INTRAVENOUS | Status: DC | PRN
  Administered 2016-07-18: 5 mL

## 2016-07-18 MED ORDER — POTASSIUM CHLORIDE CRYS ER 10 MEQ PO TBCR
10.0000 meq | EXTENDED_RELEASE_TABLET | Freq: Three times a day (TID) | ORAL | Status: DC
  Administered 2016-07-18 – 2016-07-19 (×3): 10 meq via ORAL
  Filled 2016-07-18 (×3): qty 1

## 2016-07-18 MED ORDER — WARFARIN SODIUM 10 MG PO TABS
10.0000 mg | ORAL_TABLET | Freq: Once | ORAL | Status: AC
  Administered 2016-07-18: 10 mg via ORAL
  Filled 2016-07-18 (×2): qty 1

## 2016-07-18 MED ORDER — BUPIVACAINE HCL (PF) 0.25 % IJ SOLN
INTRAMUSCULAR | Status: AC
  Filled 2016-07-18: qty 30

## 2016-07-18 MED ORDER — LATANOPROST 0.005 % OP SOLN
1.0000 [drp] | Freq: Every day | OPHTHALMIC | Status: DC
  Administered 2016-07-18: 1 [drp] via OPHTHALMIC
  Filled 2016-07-18: qty 2.5

## 2016-07-18 MED ORDER — CHLORHEXIDINE GLUCONATE CLOTH 2 % EX PADS: 6.0000 | MEDICATED_PAD | Freq: Once | CUTANEOUS | Status: DC

## 2016-07-18 MED ORDER — TIZANIDINE HCL 2 MG PO TABS: 4.0000 mg | ORAL_TABLET | Freq: Three times a day (TID) | ORAL | Status: DC | PRN

## 2016-07-18 MED ORDER — MORPHINE SULFATE (PF) 2 MG/ML IV SOLN: 1.0000 mg | INTRAVENOUS | Status: DC | PRN

## 2016-07-18 MED ORDER — ACETAMINOPHEN 650 MG RE SUPP: 650.0000 mg | Freq: Four times a day (QID) | RECTAL | Status: DC | PRN

## 2016-07-18 MED ORDER — PROMETHAZINE HCL 25 MG/ML IJ SOLN: 6.2500 mg | INTRAMUSCULAR | Status: DC | PRN

## 2016-07-18 MED ORDER — MIDAZOLAM HCL 2 MG/2ML IJ SOLN
INTRAMUSCULAR | Status: AC
  Filled 2016-07-18: qty 2

## 2016-07-18 MED ORDER — PROPOFOL 10 MG/ML IV BOLUS
INTRAVENOUS | Status: DC | PRN
Start: 2016-07-18 — End: 2016-07-18
  Administered 2016-07-18: 50 mg via INTRAVENOUS

## 2016-07-18 SURGICAL SUPPLY — 53 items
BAG DECANTER FOR FLEXI CONT (MISCELLANEOUS) ×3 IMPLANT
BLADE SURG 15 STRL LF DISP TIS (BLADE) ×1 IMPLANT
BLADE SURG 15 STRL SS (BLADE) ×2
CHLORAPREP W/TINT 26ML (MISCELLANEOUS) ×3 IMPLANT
COVER SURGICAL LIGHT HANDLE (MISCELLANEOUS) ×3 IMPLANT
COVER TRANSDUCER ULTRASND GEL (DRAPE) IMPLANT
CRADLE DONUT ADULT HEAD (MISCELLANEOUS) ×3 IMPLANT
DERMABOND ADVANCED (GAUZE/BANDAGES/DRESSINGS) ×2
DERMABOND ADVANCED .7 DNX12 (GAUZE/BANDAGES/DRESSINGS) ×1 IMPLANT
DRAPE C-ARM 42X72 X-RAY (DRAPES) ×3 IMPLANT
DRAPE CHEST BREAST 15X10 FENES (DRAPES) ×3 IMPLANT
DRAPE UTILITY XL STRL (DRAPES) ×6 IMPLANT
DRSG TEGADERM 4X4.75 (GAUZE/BANDAGES/DRESSINGS) ×6 IMPLANT
ELECT CAUTERY BLADE 6.4 (BLADE) ×3 IMPLANT
ELECT REM PT RETURN 9FT ADLT (ELECTROSURGICAL) ×3
ELECTRODE REM PT RTRN 9FT ADLT (ELECTROSURGICAL) ×1 IMPLANT
GAUZE SPONGE 2X2 8PLY STRL LF (GAUZE/BANDAGES/DRESSINGS) ×1 IMPLANT
GAUZE SPONGE 4X4 16PLY XRAY LF (GAUZE/BANDAGES/DRESSINGS) ×3 IMPLANT
GEL ULTRASOUND 20GR AQUASONIC (MISCELLANEOUS) IMPLANT
GLOVE BIOGEL PI IND STRL 7.0 (GLOVE) ×1 IMPLANT
GLOVE BIOGEL PI INDICATOR 7.0 (GLOVE) ×2
GLOVE SURG SIGNA 7.5 PF LTX (GLOVE) ×6 IMPLANT
GLOVE SURG SS PI 6.5 STRL IVOR (GLOVE) ×3 IMPLANT
GOWN STRL REUS W/ TWL LRG LVL3 (GOWN DISPOSABLE) ×1 IMPLANT
GOWN STRL REUS W/ TWL XL LVL3 (GOWN DISPOSABLE) ×1 IMPLANT
GOWN STRL REUS W/TWL LRG LVL3 (GOWN DISPOSABLE) ×2
GOWN STRL REUS W/TWL XL LVL3 (GOWN DISPOSABLE) ×2
INTRODUCER COOK 11FR (CATHETERS) IMPLANT
KIT BASIN OR (CUSTOM PROCEDURE TRAY) ×3 IMPLANT
KIT PORT POWER 8FR ISP CVUE (Catheter) ×3 IMPLANT
KIT ROOM TURNOVER OR (KITS) ×3 IMPLANT
LIQUID BAND (GAUZE/BANDAGES/DRESSINGS) ×3 IMPLANT
NEEDLE HYPO 25GX1X1/2 BEV (NEEDLE) ×3 IMPLANT
NS IRRIG 1000ML POUR BTL (IV SOLUTION) ×3 IMPLANT
PACK SURGICAL SETUP 50X90 (CUSTOM PROCEDURE TRAY) ×3 IMPLANT
PAD ARMBOARD 7.5X6 YLW CONV (MISCELLANEOUS) ×3 IMPLANT
PENCIL BUTTON HOLSTER BLD 10FT (ELECTRODE) ×3 IMPLANT
SET INTRODUCER 12FR PACEMAKER (SHEATH) IMPLANT
SET SHEATH INTRODUCER 10FR (MISCELLANEOUS) IMPLANT
SHEATH COOK PEEL AWAY SET 9F (SHEATH) IMPLANT
SPONGE GAUZE 2X2 STER 10/PKG (GAUZE/BANDAGES/DRESSINGS) ×2
SUT MNCRL AB 4-0 PS2 18 (SUTURE) ×3 IMPLANT
SUT PROLENE 2 0 SH 30 (SUTURE) ×3 IMPLANT
SUT SILK 2 0 (SUTURE)
SUT SILK 2-0 18XBRD TIE 12 (SUTURE) IMPLANT
SUT VIC AB 3-0 SH 27 (SUTURE) ×2
SUT VIC AB 3-0 SH 27XBRD (SUTURE) ×1 IMPLANT
SYR 20ML ECCENTRIC (SYRINGE) ×6 IMPLANT
SYR 5ML LL (SYRINGE) ×3 IMPLANT
SYR 5ML LUER SLIP (SYRINGE) ×3 IMPLANT
SYR CONTROL 10ML LL (SYRINGE) ×3 IMPLANT
TOWEL OR 17X24 6PK STRL BLUE (TOWEL DISPOSABLE) ×3 IMPLANT
TOWEL OR 17X26 10 PK STRL BLUE (TOWEL DISPOSABLE) ×3 IMPLANT

## 2016-07-18 NOTE — Progress Notes (Signed)
RT note: 4.0 back-up cuffless Trach arrived from [employer] and placed in pts. room with other required equipment.

## 2016-07-18 NOTE — Transfer of Care (Signed)
Immediate Anesthesia Transfer of Care Note  Patient: Candie Mile  Procedure(s) Performed: Procedure(s): INSERTION PORT-A-CATH (Right)  Patient Location: PACU  Anesthesia Type:General  Level of Consciousness: awake, alert , oriented and patient cooperative  Airway & Oxygen Therapy: Patient Spontanous Breathing and Patient connected to face mask oxygen  Post-op Assessment: Report given to RN and Post -op Vital signs reviewed and stable  Post vital signs: Reviewed and stable  Last Vitals:  Vitals:   07/18/16 1135  BP: (!) 181/97  Pulse: 64  Resp: 18  Temp: 36.5 C    Last Pain:  Vitals:   07/18/16 1135  TempSrc: Oral         Complications: No apparent anesthesia complications

## 2016-07-18 NOTE — Anesthesia Procedure Notes (Signed)
Performed by: [place] Urieta B       

## 2016-07-18 NOTE — Progress Notes (Addendum)
Pt with Trach, Shiley 4 CFS, no Cuff, I.D. 5, O.D. 9.4.

## 2016-07-18 NOTE — Anesthesia Procedure Notes (Signed)
Procedure Name: Intubation Date/Time: 07/18/2016 1:15 PM Performed by: Oletta Lamas Pre-anesthesia Checklist: Patient identified, Emergency Drugs available, Suction available and Patient being monitored Patient Re-evaluated:Patient Re-evaluated prior to inductionOxygen Delivery Method: Circle system utilized Preoxygenation: Pre-oxygenation with 100% oxygen Intubation Type: Inhalational induction, Tracheostomy and IV induction Tube size: 6.5 mm Number of attempts: 1 Airway Equipment and Method: Tracheostomy Placement Confirmation: positive ETCO2 Tube secured with: Tape Comments: IV induction initiated after tube placement confirmed.

## 2016-07-18 NOTE — Interval H&P Note (Signed)
History and Physical Interval Note:no change in H and P  07/18/2016 12:42 PM  [name]  has presented today for surgery, with the diagnosis of RIGHT BREAST CANCER  The various methods of treatment have been discussed with the patient and family. After consideration of risks, benefits and other options for treatment, the patient has consented to  Procedure(s): INSERTION PORT-A-CATH (N/A) as a surgical intervention .  The patient's history has been reviewed, patient examined, no change in status, stable for surgery.  I have reviewed the patient's chart and labs.  Questions were answered to the patient's satisfaction.     Randi Poullard A

## 2016-07-18 NOTE — Progress Notes (Signed)
RN informed RT of pt. with Trach in 5N-01, upon arrival pt. alert with red inner cannula cap in-place on 4.0 cuffless Shiley Trach(per pt. uses during day)/(pt. places open inner cannula in after cleaning on own for nitetime, no humidity used, has own cleaning equipment), placed all required equipment in room, RN aware, head of bed Trach info sheet placed/labelled with back-up 4.0 CFS Trach ordered from [employer], awaiting arrival.

## 2016-07-18 NOTE — Anesthesia Preprocedure Evaluation (Addendum)
Anesthesia Evaluation  Patient identified by MRN, date of birth, ID band Patient awake    Reviewed: Allergy & Precautions, NPO status , Patient's Chart, lab work & pertinent test results  History of Anesthesia Complications (+) PONV, DIFFICULT AIRWAY, POST - OP SPINAL HEADACHE and history of anesthetic complications  Airway Mallampati: II  TM Distance: >3 FB Neck ROM: Full    Dental no notable dental hx.    Pulmonary sleep apnea , former smoker,    Pulmonary exam normal breath sounds clear to auscultation       Cardiovascular hypertension, + CAD, + Past MI, + Peripheral Vascular Disease and +CHF  Normal cardiovascular exam+ pacemaker  Rhythm:Regular Rate:Normal  ECHO 07-18-16: Study Conclusions  - Left ventricle: The cavity size was normal. Wall thickness was   increased in a pattern of mild LVH. Systolic function was normal.   The estimated ejection fraction was in the range of 55% to 60%.   Wall motion was normal; there were no regional wall motion   abnormalities. Left ventricular diastolic function parameters   were normal. - Aortic valve: There was mild regurgitation. - Atrial septum: No defect or patent foramen ovale was identified.   Neuro/Psych  Headaches, CVA negative psych ROS   GI/Hepatic Neg liver ROS, GERD  ,  Endo/Other  negative endocrine ROSMorbid obesity  Renal/GU Renal disease  negative genitourinary   Musculoskeletal  (+) Arthritis ,   Abdominal (+) + obese,   Peds negative pediatric ROS (+)  Hematology negative hematology ROS (+)   Anesthesia Other Findings   Reproductive/Obstetrics negative OB ROS                             Anesthesia Physical Anesthesia Plan  ASA: III  Anesthesia Plan: General   Post-op Pain Management:    Induction: Intravenous  Airway Management Planned: Tracheostomy  Additional Equipment:   Intra-op Plan:   Post-operative  Plan: Extubation in OR  Informed Consent: I have reviewed the patients History and Physical, chart, labs and discussed the procedure including the risks, benefits and alternatives for the proposed anesthesia with the patient or authorized representative who has indicated his/her understanding and acceptance.   Dental advisory given  Plan Discussed with: CRNA  Anesthesia Plan Comments:        Anesthesia Quick Evaluation

## 2016-07-18 NOTE — Op Note (Addendum)
INSERTION PORT-Jefferson-CATH  Procedure Note  [name] 07/18/2016   Pre-op Diagnosis: RIGHT BREAST CANCER     Post-op Diagnosis: same  Procedure(s): INSERTION PORT-Jefferson-CATH (8FR clear view) RIGHT SUBCLAVIAN VEIN  Surgeon(s): Coralie Keens, MD  Anesthesia: Choice  Staff:  Circulator: Candie Mile, RN Radiology Technologist: Georgia Duff Hamrick Scrub Person: Christen Bame, RN  Estimated Blood Loss: Minimal                         [name]   Date: 07/18/2016  Time: 2:15 PM

## 2016-07-18 NOTE — Anesthesia Postprocedure Evaluation (Signed)
Anesthesia Post Note  Patient: [name]  Procedure(s) Performed: Procedure(s) (LRB): INSERTION PORT-A-CATH (Right)  Patient location during evaluation: PACU Anesthesia Type: General Level of consciousness: awake and alert Pain management: pain level controlled Vital Signs Assessment: post-procedure vital signs reviewed and stable Respiratory status: spontaneous breathing, nonlabored ventilation, respiratory function stable and patient connected to nasal cannula oxygen Cardiovascular status: blood pressure returned to baseline and stable Postop Assessment: no signs of nausea or vomiting Anesthetic complications: no    Last Vitals:  Vitals:   07/18/16 1616 07/18/16 1625  BP: (!) 180/91 (!) 177/94  Pulse: 61 60  Resp: 15 16  Temp:      Last Pain:  Vitals:   07/18/16 1526  TempSrc:   PainSc: 0-No pain                 [name]

## 2016-07-18 NOTE — H&P (Signed)
PATIENT UNWILLING TO ANSWER ANY MORE QUESTIONS AT THIS TIME

## 2016-07-18 NOTE — Progress Notes (Signed)
Pace maker has been interrogated, no complications.   Sharene Skeans, RN

## 2016-07-18 NOTE — Progress Notes (Signed)
  Echocardiogram 2D Echocardiogram has been performed.  Zooey Schreurs 07/18/2016, 10:57 AM

## 2016-07-19 ENCOUNTER — Encounter: Payer: Self-pay | Admitting: *Deleted

## 2016-07-19 ENCOUNTER — Encounter (HOSPITAL_COMMUNITY): Payer: Self-pay | Admitting: Surgery

## 2016-07-19 ENCOUNTER — Ambulatory Visit (HOSPITAL_BASED_OUTPATIENT_CLINIC_OR_DEPARTMENT_OTHER): Payer: PPO | Admitting: Hematology and Oncology

## 2016-07-19 ENCOUNTER — Ambulatory Visit: Payer: PPO

## 2016-07-19 ENCOUNTER — Other Ambulatory Visit (HOSPITAL_BASED_OUTPATIENT_CLINIC_OR_DEPARTMENT_OTHER): Payer: PPO

## 2016-07-19 ENCOUNTER — Ambulatory Visit (HOSPITAL_BASED_OUTPATIENT_CLINIC_OR_DEPARTMENT_OTHER): Payer: PPO

## 2016-07-19 VITALS — BP 175/83 | HR 60 | Temp 98.2°F | Resp 19

## 2016-07-19 DIAGNOSIS — Z5111 Encounter for antineoplastic chemotherapy: Secondary | ICD-10-CM

## 2016-07-19 DIAGNOSIS — Z170 Estrogen receptor positive status [ER+]: Principal | ICD-10-CM

## 2016-07-19 DIAGNOSIS — C50911 Malignant neoplasm of unspecified site of right female breast: Secondary | ICD-10-CM | POA: Diagnosis not present

## 2016-07-19 DIAGNOSIS — C7951 Secondary malignant neoplasm of bone: Secondary | ICD-10-CM

## 2016-07-19 DIAGNOSIS — C50211 Malignant neoplasm of upper-inner quadrant of right female breast: Secondary | ICD-10-CM

## 2016-07-19 LAB — RESEARCH LABS

## 2016-07-19 LAB — PROTIME-INR
INR: 1.24
Prothrombin Time: 15.7 seconds — ABNORMAL HIGH (ref 11.4–15.2)

## 2016-07-19 MED ORDER — OXYCODONE HCL 5 MG PO TABS: 5.0000 mg | ORAL_TABLET | ORAL | 0 refills | Status: DC | PRN

## 2016-07-19 MED ORDER — SODIUM CHLORIDE 0.9 % IV SOLN
Freq: Once | INTRAVENOUS | Status: AC
  Administered 2016-07-19: 13:00:00 via INTRAVENOUS

## 2016-07-19 MED ORDER — HEPARIN SOD (PORK) LOCK FLUSH 100 UNIT/ML IV SOLN
500.0000 [IU] | Freq: Once | INTRAVENOUS | Status: AC | PRN
  Administered 2016-07-19: 500 [IU]
  Filled 2016-07-19: qty 5

## 2016-07-19 MED ORDER — CYCLOPHOSPHAMIDE CHEMO INJECTION 1 GM
600.0000 mg/m2 | Freq: Once | INTRAMUSCULAR | Status: AC
  Administered 2016-07-19: 1380 mg via INTRAVENOUS
  Filled 2016-07-19: qty 69

## 2016-07-19 MED ORDER — SODIUM CHLORIDE 0.9 % IV SOLN
10.0000 mg | Freq: Once | INTRAVENOUS | Status: AC
  Administered 2016-07-19: 10 mg via INTRAVENOUS
  Filled 2016-07-19: qty 1

## 2016-07-19 MED ORDER — SODIUM CHLORIDE 0.9% FLUSH
10.0000 mL | INTRAVENOUS | Status: DC | PRN
  Administered 2016-07-19: 10 mL
  Filled 2016-07-19: qty 10

## 2016-07-19 MED ORDER — SODIUM CHLORIDE 0.9 % IV SOLN
75.0000 mg/m2 | Freq: Once | INTRAVENOUS | Status: AC
  Administered 2016-07-19: 170 mg via INTRAVENOUS
  Filled 2016-07-19: qty 17

## 2016-07-19 MED ORDER — PALONOSETRON HCL INJECTION 0.25 MG/5ML
0.2500 mg | Freq: Once | INTRAVENOUS | Status: AC
  Administered 2016-07-19: 0.25 mg via INTRAVENOUS

## 2016-07-19 MED ORDER — PALONOSETRON HCL INJECTION 0.25 MG/5ML
INTRAVENOUS | Status: AC
  Filled 2016-07-19: qty 5

## 2016-07-19 MED ORDER — PEGFILGRASTIM 6 MG/0.6ML ~~LOC~~ PSKT
6.0000 mg | PREFILLED_SYRINGE | Freq: Once | SUBCUTANEOUS | Status: AC
  Administered 2016-07-19: 6 mg via SUBCUTANEOUS
  Filled 2016-07-19: qty 0.6

## 2016-07-19 NOTE — Op Note (Signed)
NAMEMarland Kitchen  MAJESTI, DUHN NO.:  [id number]  MEDICAL RECORD NO.:  [id number]  LOCATION:  6N01C                        FACILITY:  [facility]  PHYSICIAN:  Coralie Keens, M.D. DATE OF BIRTH:  03-03-1951  DATE OF PROCEDURE:  07/18/2016 DATE OF DISCHARGE:                              OPERATIVE REPORT   PREOPERATIVE DIAGNOSIS:  Right breast cancer.  POSTOPERATIVE DIAGNOSIS:  Right breast cancer.  PROCEDURE:  Right subclavian vein Port-A-Cath insertion (8-French ClearVUE port).  SURGEON:  Coralie Keens, M.D.  ANESTHESIA:  General and 0.25% Marcaine.  ESTIMATED BLOOD LOSS:  Minimal.  INDICATIONS:  This is a 65 year old female with multiple medical issues including a tracheostomy in place and a left-sided pacemaker, who has a right-sided breast cancer.  Decision was made to proceed with neoadjuvant chemotherapy.  Therefore, a port is being inserted.  PROCEDURE IN DETAIL:  The patient was brought to the operating room, identified as [name].  She was placed supine on the operating table and general anesthesia was induced.  Anesthesiologist removed her tracheostomy and placed an endotracheal tube.  Her right chest was then prepped and draped in usual sterile fashion.  She was placed in the Trendelenburg position.  I then used the introducer needle to cannulate the right subclavian vein on the very first stick.  I placed a wire through the needle into the central venous system under fluoroscopy.  I then anesthetized the skin around the introduction site with Marcaine.  I then made a longitudinal incision on the right upper chest with a scalpel.  I took this down into the subcutaneous tissue with electrocautery.  I then created a pocket for the port.  An 8-French ClearVUE port was brought onto the field.  I attached the catheter to the port and irrigated with saline.  I then cut it in appropriate length.  I then placed the introducer sheath and dilator over  the wire and into the central venous system.  I then removed the wire and dilator.  I put the port into the pocket and fed the catheter down the peel-away sheath.  The sheath was then peeled away leaving the catheter in central venous system.  Fluoroscopy confirmed placement in the central venous system.  I accessed the port, and good flush and return were demonstrated.  I then sewed it to the chest wall with two separate 2-0 Prolene sutures.  I then closed the subcutaneous tissue with interrupted 3-0 Vicryl sutures and closed the skin with running 4-0 Monocryl.  I then accessed the port and again good flush and return were demonstrated and I left the port access.  Skin glue and an occlusive bandage were then applied.  At this point, the anesthesiologist replaced her previous tracheostomy with the new one.  She was then extubated in the operating room and taken in a stable condition to the recovery room.     Coralie Keens, M.D.     DB/MEDQ  D:  07/18/2016  T:  07/19/2016  Job:  [id number]

## 2016-07-19 NOTE — Patient Instructions (Addendum)
[facility] Discharge Instructions for Patients Receiving Chemotherapy  Today you received the following chemotherapy agents Taxotere & Cytoxan  To help prevent nausea and vomiting after your treatment, we encourage you to take your nausea medication as directed.   Take Compazine for next 3 days to help with nausea and thereafter ok to take Zofran. Neulasta Onpro was attached at 07/19/2016 at 4:45 pm  Ok to remove at 29 hours later on Friday 07/20/2016 after 10pm   If you develop nausea and vomiting that is not controlled by your nausea medication, call the clinic.   BELOW ARE SYMPTOMS THAT SHOULD BE REPORTED IMMEDIATELY:  *FEVER GREATER THAN 100.5 F  *CHILLS WITH OR WITHOUT FEVER  NAUSEA AND VOMITING THAT IS NOT CONTROLLED WITH YOUR NAUSEA MEDICATION  *UNUSUAL SHORTNESS OF BREATH  *UNUSUAL BRUISING OR BLEEDING  TENDERNESS IN MOUTH AND THROAT WITH OR WITHOUT PRESENCE OF ULCERS  *URINARY PROBLEMS  *BOWEL PROBLEMS  UNUSUAL RASH Items with * indicate a potential emergency and should be followed up as soon as possible.  Feel free to call the clinic you have any questions or concerns. The clinic phone number is [phone].  Please show the [facility] at check-in to the Emergency Department and triage nurse.  Docetaxel injection What is this medicine? DOCETAXEL (doe se TAX el) is a chemotherapy drug. It targets fast dividing cells, like cancer cells, and causes these cells to die. This medicine is used to treat many types of cancers like breast cancer, certain stomach cancers, head and neck cancer, lung cancer, and prostate cancer. This medicine may be used for other purposes; ask your health care provider or pharmacist if you have questions. What should I tell my health care provider before I take this medicine? They need to know if you have any of these conditions: -infection (especially a virus infection such as chickenpox, cold sores, or herpes) -liver  disease -low blood counts, like low white cell, platelet, or red cell counts -an unusual or allergic reaction to docetaxel, polysorbate 80, other chemotherapy agents, other medicines, foods, dyes, or preservatives -pregnant or trying to get pregnant -breast-feeding How should I use this medicine? This drug is given as an infusion into a vein. It is administered in a hospital or clinic by a specially trained health care professional. Talk to your pediatrician regarding the use of this medicine in children. Special care may be needed. Overdosage: If you think you have taken too much of this medicine contact a poison control center or emergency room at once. NOTE: This medicine is only for you. Do not share this medicine with others. What if I miss a dose? It is important not to miss your dose. Call your doctor or health care professional if you are unable to keep an appointment. What may interact with this medicine? -cyclosporine -erythromycin -ketoconazole -medicines to increase blood counts like filgrastim, pegfilgrastim, sargramostim -vaccines Talk to your doctor or health care professional before taking any of these medicines: -acetaminophen -aspirin -ibuprofen -ketoprofen -naproxen This list may not describe all possible interactions. Give your health care provider a list of all the medicines, herbs, non-prescription drugs, or dietary supplements you use. Also tell them if you smoke, drink alcohol, or use illegal drugs. Some items may interact with your medicine. What should I watch for while using this medicine? Your condition will be monitored carefully while you are receiving this medicine. You will need important blood work done while you are taking this medicine. This drug  may make you feel generally unwell. This is not uncommon, as chemotherapy can affect healthy cells as well as cancer cells. Report any side effects. Continue your course of treatment even though you feel ill  unless your doctor tells you to stop. In some cases, you may be given additional medicines to help with side effects. Follow all directions for their use. Call your doctor or health care professional for advice if you get a fever, chills or sore throat, or other symptoms of a cold or flu. Do not treat yourself. This drug decreases your body's ability to fight infections. Try to avoid being around people who are sick. This medicine may increase your risk to bruise or bleed. Call your doctor or health care professional if you notice any unusual bleeding. This medicine may contain alcohol in the product. You may get drowsy or dizzy. Do not drive, use machinery, or do anything that needs mental alertness until you know how this medicine affects you. Do not stand or sit up quickly, especially if you are an older patient. This reduces the risk of dizzy or fainting spells. Avoid alcoholic drinks. Do not become pregnant while taking this medicine. Women should inform their doctor if they wish to become pregnant or think they might be pregnant. There is a potential for serious side effects to an unborn child. Talk to your health care professional or pharmacist for more information. Do not breast-feed an infant while taking this medicine. What side effects may I notice from receiving this medicine? Side effects that you should report to your doctor or health care professional as soon as possible: -allergic reactions like skin rash, itching or hives, swelling of the face, lips, or tongue -low blood counts - This drug may decrease the number of white blood cells, red blood cells and platelets. You may be at increased risk for infections and bleeding. -signs of infection - fever or chills, cough, sore throat, pain or difficulty passing urine -signs of decreased platelets or bleeding - bruising, pinpoint red spots on the skin, black, tarry stools, nosebleeds -signs of decreased red blood cells - unusually weak or  tired, fainting spells, lightheadedness -breathing problems -fast or irregular heartbeat -low blood pressure -mouth sores -nausea and vomiting -pain, swelling, redness or irritation at the injection site -pain, tingling, numbness in the hands or feet -swelling of the ankle, feet, hands -weight gain Side effects that usually do not require medical attention (report to your prescriber or health care professional if they continue or are bothersome): -bone pain -complete hair loss including hair on your head, underarms, pubic hair, eyebrows, and eyelashes -diarrhea -excessive tearing -changes in the color of fingernails -loosening of the fingernails -nausea -muscle pain -red flush to skin -sweating -weak or tired This list may not describe all possible side effects. Call your doctor for medical advice about side effects. You may report side effects to FDA at 1-800-FDA-1088. Where should I keep my medicine? This drug is given in a hospital or clinic and will not be stored at home. NOTE: This sheet is a summary. It may not cover all possible information. If you have questions about this medicine, talk to your doctor, pharmacist, or health care provider.    2016, Elsevier/Gold Standard. (2014-10-11 16:04:57)   Cyclophosphamide injection What is this medicine? CYCLOPHOSPHAMIDE (sye kloe FOSS fa mide) is a chemotherapy drug. It slows the growth of cancer cells. This medicine is used to treat many types of cancer like lymphoma, myeloma, leukemia, breast cancer, and  ovarian cancer, to name a few. This medicine may be used for other purposes; ask your health care provider or pharmacist if you have questions. What should I tell my health care provider before I take this medicine? They need to know if you have any of these conditions: -blood disorders -history of other chemotherapy -infection -kidney disease -liver disease -recent or ongoing radiation therapy -tumors in the bone  marrow -an unusual or allergic reaction to cyclophosphamide, other chemotherapy, other medicines, foods, dyes, or preservatives -pregnant or trying to get pregnant -breast-feeding How should I use this medicine? This drug is usually given as an injection into a vein or muscle or by infusion into a vein. It is administered in a hospital or clinic by a specially trained health care professional. Talk to your pediatrician regarding the use of this medicine in children. Special care may be needed. Overdosage: If you think you have taken too much of this medicine contact a poison control center or emergency room at once. NOTE: This medicine is only for you. Do not share this medicine with others. What if I miss a dose? It is important not to miss your dose. Call your doctor or health care professional if you are unable to keep an appointment. What may interact with this medicine? This medicine may interact with the following medications: -amiodarone -amphotericin B -azathioprine -certain antiviral medicines for HIV or AIDS such as protease inhibitors (e.g., indinavir, ritonavir) and zidovudine -certain blood pressure medications such as benazepril, captopril, enalapril, fosinopril, lisinopril, moexipril, monopril, perindopril, quinapril, ramipril, trandolapril -certain cancer medications such as anthracyclines (e.g., daunorubicin, doxorubicin), busulfan, cytarabine, paclitaxel, pentostatin, tamoxifen, trastuzumab -certain diuretics such as chlorothiazide, chlorthalidone, hydrochlorothiazide, indapamide, metolazone -certain medicines that treat or prevent blood clots like warfarin -certain muscle relaxants such as succinylcholine -cyclosporine -etanercept -indomethacin -medicines to increase blood counts like filgrastim, pegfilgrastim, sargramostim -medicines used as general anesthesia -metronidazole -natalizumab This list may not describe all possible interactions. Give your health care  provider a list of all the medicines, herbs, non-prescription drugs, or dietary supplements you use. Also tell them if you smoke, drink alcohol, or use illegal drugs. Some items may interact with your medicine. What should I watch for while using this medicine? Visit your doctor for checks on your progress. This drug may make you feel generally unwell. This is not uncommon, as chemotherapy can affect healthy cells as well as cancer cells. Report any side effects. Continue your course of treatment even though you feel ill unless your doctor tells you to stop. Drink water or other fluids as directed. Urinate often, even at night. In some cases, you may be given additional medicines to help with side effects. Follow all directions for their use. Call your doctor or health care professional for advice if you get a fever, chills or sore throat, or other symptoms of a cold or flu. Do not treat yourself. This drug decreases your body's ability to fight infections. Try to avoid being around people who are sick. This medicine may increase your risk to bruise or bleed. Call your doctor or health care professional if you notice any unusual bleeding. Be careful brushing and flossing your teeth or using a toothpick because you may get an infection or bleed more easily. If you have any dental work done, tell your dentist you are receiving this medicine. You may get drowsy or dizzy. Do not drive, use machinery, or do anything that needs mental alertness until you know how this medicine affects you. Do not  become pregnant while taking this medicine or for 1 year after stopping it. Women should inform their doctor if they wish to become pregnant or think they might be pregnant. Men should not father a child while taking this medicine and for 4 months after stopping it. There is a potential for serious side effects to an unborn child. Talk to your health care professional or pharmacist for more information. Do not  breast-feed an infant while taking this medicine. This medicine may interfere with the ability to have a child. This medicine has caused ovarian failure in some women. This medicine has caused reduced sperm counts in some men. You should talk with your doctor or health care professional if you are concerned about your fertility. If you are going to have surgery, tell your doctor or health care professional that you have taken this medicine. What side effects may I notice from receiving this medicine? Side effects that you should report to your doctor or health care professional as soon as possible: -allergic reactions like skin rash, itching or hives, swelling of the face, lips, or tongue -low blood counts - this medicine may decrease the number of white blood cells, red blood cells and platelets. You may be at increased risk for infections and bleeding. -signs of infection - fever or chills, cough, sore throat, pain or difficulty passing urine -signs of decreased platelets or bleeding - bruising, pinpoint red spots on the skin, black, tarry stools, blood in the urine -signs of decreased red blood cells - unusually weak or tired, fainting spells, lightheadedness -breathing problems -dark urine -dizziness -palpitations -swelling of the ankles, feet, hands -trouble passing urine or change in the amount of urine -weight gain -yellowing of the eyes or skin Side effects that usually do not require medical attention (report to your doctor or health care professional if they continue or are bothersome): -changes in nail or skin color -hair loss -missed menstrual periods -mouth sores -nausea, vomiting This list may not describe all possible side effects. Call your doctor for medical advice about side effects. You may report side effects to FDA at 1-800-FDA-1088. Where should I keep my medicine? This drug is given in a hospital or clinic and will not be stored at home. NOTE: This sheet is a  summary. It may not cover all possible information. If you have questions about this medicine, talk to your doctor, pharmacist, or health care provider.    2016, [employer]vier/Gold Standard. (2012-08-08 16:22:58)

## 2016-07-19 NOTE — Progress Notes (Signed)
[name], MD as PCP - General (Family Medicine) [name], MD as Consulting Physician (Cardiology)  SUMMARY OF ONCOLOGIC HISTORY:   Breast cancer of upper-inner quadrant of right female breast ([place])   06/15/2016 Mammogram    Right breast irregular mass UIQ with diffuse skin thickening that is palpable, additional pleomorphic calcifications 2.8 x 2.1 x 3.5 cm extending posteriorly for another 2 cm; left breast 8 mm circumscribed nodule       06/21/2016 Initial Diagnosis    Right breast biopsy UIQ: IDC grade 2 with papillary features ER 100%, PR 100%, Ki-67 15%, HER-2 negative ratio 1.21; right axillary LN biopsy metastatic carcinoma in one lymph node with ECE, ER 45%, PR 0%, Ki-67 20%, HER-2 negative ratio 1.86      06/28/2016 Procedure    Left breast biopsy: Fibroadenoma      07/16/2016 PET scan    Right breast primary inflammatory breast cancer, right axillary lymph node metastases, T7 bone metastases, indeterminate focus in the left medial breast 7 mm, right adrenal nodule      07/19/2016 -  Neo-Adjuvant Chemotherapy    Neoadjuvant Taxotere and Cytoxan every 3 weeks 4 cycles       CHIEF COMPLIANT: cycle 1 day 1 neoadjuvant chemotherapy with Taxotere and Cytoxan  INTERVAL HISTORY: [name] is a -year-old with above-mentioned history of inflammatory right breast cancer who underwent PET/CT scan and a port placement and is here today to start her chemotherapy. On the PET CT scan there was one solitary bone metastases at T7 vertebrae.  REVIEW OF SYSTEMS:   Constitutional: Denies fevers, chills or abnormal weight loss Eyes: Denies blurriness of vision Ears, nose, mouth, throat, and face: Denies mucositis or sore throat Respiratory: Denies cough, dyspnea or wheezes Cardiovascular: Denies palpitation, chest discomfort Gastrointestinal:  Denies nausea, heartburn or change in bowel habits Skin: Denies abnormal skin rashes Lymphatics: Denies  new lymphadenopathy or easy bruising Neurological:Denies numbness, tingling or new weaknesses Behavioral/Psych: Mood is stable, no new changes  Extremities: No lower extremity edema Breast:inflammatory right breast cancer All other systems were reviewed with the patient and are negative.  I have reviewed the past medical history, past surgical history, social history and family history with the patient and they are unchanged from previous note.  ALLERGIES:  is allergic to lisinopril and eggs or egg-derived products.  MEDICATIONS:  Current Outpatient Prescriptions  Medication Sig Dispense Refill  . cholecalciferol (VITAMIN D) 1000 UNITS tablet Take 1,000 Units by mouth daily.    Marland Kitchen gabapentin (NEURONTIN) 300 MG capsule Take 1 capsule (300 mg total) by mouth 3 (three) times daily. 90 capsule 2  . hydrochlorothiazide (HYDRODIURIL) 12.5 MG tablet Take 1 tablet (12.5 mg total) by mouth daily. 90 tablet 3  . latanoprost (XALATAN) 0.005 % ophthalmic solution Place 1 drop into both eyes at bedtime.     . lidocaine-prilocaine (EMLA) cream Apply to affected area once (Patient taking differently: Apply 1 application topically daily as needed (prior to port access). Apply to affected area once) 30 g 3  . losartan (COZAAR) 100 MG tablet Take 1 tablet (100 mg total) by mouth daily. Must have labs and appt for any further refills please 30 tablet 1  . ondansetron (ZOFRAN) 8 MG tablet Take 1 tablet (8 mg total) by mouth 2 (two) times daily as needed for refractory nausea / vomiting. Start on day 3 after chemo. 30 tablet 1  . oxyCODONE (OXY IR/ROXICODONE) 5 MG immediate release tablet Take  1-2 tablets (5-10 mg total) by mouth every 4 (four) hours as needed for moderate pain. 40 tablet 0  . oxyCODONE-acetaminophen (PERCOCET) 10-325 MG tablet Take 0.5-1 tablets by mouth every 6 (six) hours as needed for pain. 20 tablet 0  . pantoprazole (PROTONIX) 40 MG tablet Take 1 tablet (40 mg total) by mouth daily as needed.  Caution:prolonged use may increase risk of pneumonia, colitis, osteoporosis, anemia (Patient taking differently: Take 40 mg by mouth daily as needed (for acid reflux/gerd). Caution:prolonged use may increase risk of pneumonia, colitis, osteoporosis, anemia) 90 tablet 0  . potassium chloride (KLOR-CON M10) 10 MEQ tablet Take 1 tablet (10 mEq total) by mouth 3 (three) times daily. 270 tablet 1  . prochlorperazine (COMPAZINE) 10 MG tablet Take 1 tablet (10 mg total) by mouth every 6 (six) hours as needed for nausea or vomiting. 30 tablet 3  . promethazine (PHENERGAN) 12.5 MG tablet Take 12.5 mg by mouth every 6 (six) hours as needed for nausea or vomiting.    . tizanidine (ZANAFLEX) 2 MG capsule Take 2 capsules (4 mg total) by mouth 3 (three) times daily as needed for muscle spasms. 30 capsule 1  . warfarin (COUMADIN) 4 MG tablet Take 12 mg by mouth as directed. Daily with the exception of Fridays     No current facility-administered medications for this visit.     PHYSICAL EXAMINATION: ECOG PERFORMANCE STATUS: 1 - Symptomatic but completely ambulatory  Vitals:   07/19/16 1159  BP: (!) 143/75  Pulse: 60  Resp: 18  Temp: 98.2 F (36.8 C)   Filed Weights   07/19/16 1159  Weight: 250 lb 12.8 oz (113.8 kg)    GENERAL:alert, no distress and comfortable SKIN: skin color, texture, turgor are normal, no rashes or significant lesions EYES: normal, Conjunctiva are pink and non-injected, sclera clear OROPHARYNX:no exudate, no erythema and lips, buccal mucosa, and tongue normal  NECK: supple, thyroid normal size, non-tender, without nodularity LYMPH:  no palpable lymphadenopathy in the cervical, axillary or inguinal LUNGS: clear to auscultation and percussion with normal breathing effort HEART: regular rate & rhythm and no murmurs and no lower extremity edema ABDOMEN:abdomen soft, non-tender and normal bowel sounds MUSCULOSKELETAL:no cyanosis of digits and no clubbing  NEURO: alert & oriented x  3 with fluent speech, no focal motor/sensory deficits EXTREMITIES: No lower extremity edema  LABORATORY DATA:  I have reviewed the data as listed   Chemistry      Component Value Date/Time   NA 141 07/16/2016 1506   NA 139 07/22/2015 1437   K 3.8 07/16/2016 1506   CL 108 07/16/2016 1506   CO2 26 07/16/2016 1506   BUN 14 07/16/2016 1506   BUN 19 07/22/2015 1437   CREATININE 1.32 (H) 07/16/2016 1506   CREATININE 1.49 (H) 05/30/2016 1659   GLU 98 11/26/2014      Component Value Date/Time   CALCIUM 9.6 07/16/2016 1506   ALKPHOS 56 05/25/2016 1554   AST 27 05/25/2016 1554   ALT 17 05/25/2016 1554   BILITOT 0.5 05/25/2016 1554   BILITOT 0.4 07/22/2015 1437       Lab Results  Component Value Date   WBC 5.8 07/16/2016   HGB 12.0 07/16/2016   HCT 37.9 07/16/2016   MCV 90.7 07/16/2016   PLT 266 07/16/2016   NEUTROABS 3.1 05/25/2016     ASSESSMENT & PLAN:  Breast cancer of upper-inner quadrant of right female breast ([place]) 06/15/2016: Right breast irregular mass UIQ with diffuse skin thickening that  is palpable, additional pleomorphic calcifications 2.8 x 2.1 x 3.5 cm extending posteriorly for another 2 cm; left breast 8 mm circumscribed nodule  06/21/2016: Right breast biopsy UIQ: IDC grade 2 with papillary features ER 100%, PR 100%, Ki-67 15%, HER-2 negative ratio 1.21; right axillary LN biopsy metastatic carcinoma in one lymph node with ECE, ER 45%, PR 0%, Ki-67 20%, HER-2 negative ratio 1.86  07/16/2016: PET/CT scan: Right breast inflammatory breast cancer, T7 osseous metastases, indeterminate focus medial left breast corresponding to 7 mm calcified soft tissue nodule, right adrenal adenoma  Clinical stage: T4 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 Neoadjuvant chemotherapy with Taxotere Cytoxan 4 (because patient has history of cardiac problems and has a pacemaker) 2. followed by mastectomy with axillary node dissection 3. Followed by adjuvant radiation including radiation  to T7 vertebral body 4. Followed by antiestrogen therapy with anastrozole 10 years ---------------------------------------------------------------------------------------------------------------------------------------------------------------- Radiology review: I discussed with the patient the result of the PET CT scan showing hypermetabolic is involving the T7 vertebra consistent with metastatic disease with an SUV of 14. That is the only site of metastatic disease. Because of this we elected to pursue with more aggressive systemic therapy approach with Taxotere and Cytoxan.  Current treatment: Cycle 1 day 1 Taxotere and Cytoxan Goal of treatment: Although cure is not possible for stage IV breast cancer, we are treating her with a curative intent to cause she only has a solitary site of metastatic disease.  Antiemetics were reviewed Labs were reviewed Chemotherapy education completed Chemotherapy consent Return to clinic in one week for toxicity check.  ------------------------------------------------------------------------------------------------------------------- Consent for research study: Procurement of human bio specimens for the discovery and validation of biomarkers for the prediction, diagnosis and management of disease  Met with patient (alone) for 15 minutes to review the pathology specimen consent form and HIPAA. Study involves a one-time blood draw. Patient was told of the voluntary nature of the study, risks, benefits, and alternatives to participation. All of the patient's questions were answered, and the patient agreed to participate in the study. The consent and HIPAA forms were signed and dated by the patient. All eligibility criteria have been met and patient has been enrolled in the pathology procurement study. Research staff will provide the patient with a copy of the consent form and HIPAA document and give the patient a gift card after the blood has been  obtained. --------------------------------------------------------------------------------------------------------------------   Orders Placed This Encounter  Procedures  . Research Labs    Standing Status:   Future    Standing Expiration Date:   07/19/2017   The patient has a good understanding of the overall plan. she agrees with it. she will call with any problems that may develop before the next visit here.   Rulon Eisenmenger, MD 07/19/16

## 2016-07-19 NOTE — Progress Notes (Signed)
Per Eulas Post RN via Dr. Lindi Adie appproved treatment based on BMP and CBC from 07/16/2016 from hospital stay.

## 2016-07-19 NOTE — Discharge Instructions (Signed)
Ok to shower

## 2016-07-19 NOTE — Progress Notes (Signed)
Patient ID: [name], female   DOB: 1951-09-13, 65 y.o.   MRN: [id number]   Doing well after port-a-cath placement Complains only of pain at port site Lungs are clear Port is accessed  Plan: Discharge and transport to [facility] cancer for chemo this morning

## 2016-07-19 NOTE — Discharge Planning (Signed)
AVS and rx given to pt who verbalizes understanding. Will be discharged and go to [facility] for an [address] apt. No ride available, taxi ride set up with voucher. Dc'd at 91 to taxi with all personal belongings.

## 2016-07-19 NOTE — Assessment & Plan Note (Signed)
06/15/2016: Right breast irregular mass UIQ with diffuse skin thickening that is palpable, additional pleomorphic calcifications 2.8 x 2.1 x 3.5 cm extending posteriorly for another 2 cm; left breast 8 mm circumscribed nodule  06/21/2016: Right breast biopsy UIQ: IDC grade 2 with papillary features ER 100%, PR 100%, Ki-67 15%, HER-2 negative ratio 1.21; right axillary LN biopsy metastatic carcinoma in one lymph node with ECE, ER 45%, PR 0%, Ki-67 20%, HER-2 negative ratio 1.86  07/16/2016: PET/CT scan: Right breast inflammatory breast cancer, T7 osseous metastases, indeterminate focus medial left breast corresponding to 7 mm calcified soft tissue nodule, right adrenal adenoma  Clinical stage: T4 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 Neoadjuvant chemotherapy with Taxotere Cytoxan 4 (because patient has history of cardiac problems and has a pacemaker) 2. followed by mastectomy with axillary node dissection 3. Followed by adjuvant radiation 4. Followed by antiestrogen therapy with anastrozole 10 years ---------------------------------------------------------------------------------------------------------------------------------------------------------------- Radiology review: I discussed with the patient the result of the PET CT scan showing hypermetabolic is involving the T7 vertebra consistent with metastatic disease with an SUV of 14. That is the only site of metastatic disease. Because of this we elected to pursue with more aggressive systemic therapy approach with Taxotere and Cytoxan.  Current treatment: Cycle 1 day 1 Taxotere and Cytoxan Goal of treatment: Although cure is not possible for stage IV breast cancer, we are treating her with a curative intent to cause she only has a solitary site of metastatic disease.  Antiemetics were reviewed Labs were reviewed Chemotherapy education completed Chemotherapy consent Return to clinic in one week for toxicity check.

## 2016-07-19 NOTE — Clinical Social Work Note (Signed)
CSW received a call from RN. RN reported patient has no supports or funds for transportation. CSW provided a taxi voucher.   [employer], LCSW [phone]

## 2016-07-19 NOTE — Discharge Summary (Signed)
Physician Discharge Summary  Patient ID: [name] MRN: [id number] DOB/AGE: [birth year] 63 y.o.  Admit date: 07/18/2016 Discharge date: 07/19/2016  Admission Diagnoses:  Discharge Diagnoses:  Active Problems:   History of right breast cancer   Discharged Condition: good  Hospital Course: admitted post op port placement given co-morbidities.  Did well.  Discharged pod#1  Consults: None  Significant Diagnostic Studies:   Treatments: surgery: right subclavian port a cath insertion  Discharge Exam: Blood pressure (!) 152/78, pulse (!) 59, temperature 97.5 F (36.4 C), temperature source Oral, resp. rate 16, height 5\' 7"  (1.702 m), weight 117.9 kg (259 lb 14.8 oz), SpO2 100 %. General appearance: alert, cooperative and no distress Resp: clear to auscultation bilaterally Cardio: regular rate and rhythm, S1, S2 normal, no murmur, click, rub or gallop Incision/Wound:trach site ok Port site with bandage, accessed  Disposition: 07-Left Against Medical Advice/Left Without Being Seen/Elopement     Medication List    TAKE these medications   anastrozole 1 MG tablet Commonly known as:  ARIMIDEX Take 1 tablet (1 mg total) by mouth daily.   cholecalciferol 1000 units tablet Commonly known as:  VITAMIN D Take 1,000 Units by mouth daily.   gabapentin 300 MG capsule Commonly known as:  NEURONTIN Take 1 capsule (300 mg total) by mouth 3 (three) times daily.   hydrochlorothiazide 12.5 MG tablet Commonly known as:  HYDRODIURIL Take 1 tablet (12.5 mg total) by mouth daily.   latanoprost 0.005 % ophthalmic solution Commonly known as:  XALATAN Place 1 drop into both eyes at bedtime.   lidocaine-prilocaine cream Commonly known as:  EMLA Apply to affected area once What changed:  how much to take  how to take this  when to take this  reasons to take this  additional instructions   losartan 100 MG tablet Commonly known as:  COZAAR Take 1 tablet (100 mg total)  by mouth daily. Must have labs and appt for any further refills please   ondansetron 8 MG tablet Commonly known as:  ZOFRAN Take 1 tablet (8 mg total) by mouth 2 (two) times daily as needed for refractory nausea / vomiting. Start on day 3 after chemo.   oxyCODONE 5 MG immediate release tablet Commonly known as:  Oxy IR/ROXICODONE Take 1-2 tablets (5-10 mg total) by mouth every 4 (four) hours as needed for moderate pain.   oxyCODONE-acetaminophen 10-325 MG tablet Commonly known as:  PERCOCET Take 0.5-1 tablets by mouth every 6 (six) hours as needed for pain.   pantoprazole 40 MG tablet Commonly known as:  PROTONIX Take 1 tablet (40 mg total) by mouth daily as needed. Caution:prolonged use may increase risk of pneumonia, colitis, osteoporosis, anemia What changed:  reasons to take this  additional instructions   potassium chloride 10 MEQ tablet Commonly known as:  KLOR-CON M10 Take 1 tablet (10 mEq total) by mouth 3 (three) times daily.   prochlorperazine 10 MG tablet Commonly known as:  COMPAZINE Take 1 tablet (10 mg total) by mouth every 6 (six) hours as needed for nausea or vomiting.   promethazine 12.5 MG tablet Commonly known as:  PHENERGAN Take 12.5 mg by mouth every 6 (six) hours as needed for nausea or vomiting.   tizanidine 2 MG capsule Commonly known as:  ZANAFLEX Take 2 capsules (4 mg total) by mouth 3 (three) times daily as needed for muscle spasms.   warfarin 4 MG tablet Commonly known as:  COUMADIN Take 12 mg by mouth as directed. Daily with the exception  of Fridays What changed:  Another medication with the same name was changed. Make sure you understand how and when to take each.   warfarin 4 MG tablet Commonly known as:  COUMADIN Two and one-half pills (10 mg) daily OR as directed by your doctor What changed:  how much to take  how to take this  when to take this  additional instructions   warfarin 4 MG tablet Commonly known as:  COUMADIN TAKE  2 TABLETS BY MOUTH 3 DAYS A WEEK, AND 3 TABLETS FOUR DAYS A WEEK OR AS DIRECTED BY YOUR DOCTOR What changed:  Another medication with the same name was changed. Make sure you understand how and when to take each.      Follow-up Information    Tareq Dwan A, MD. Schedule an appointment as soon as possible for a visit in 1 month(s).   Specialty:  General Surgery Contact information: [address] [place] [address] [phone]           Signed: Harl Bowie 07/19/2016, 6:37 AM

## 2016-07-26 ENCOUNTER — Ambulatory Visit (HOSPITAL_BASED_OUTPATIENT_CLINIC_OR_DEPARTMENT_OTHER): Payer: PPO | Admitting: Hematology and Oncology

## 2016-07-26 ENCOUNTER — Encounter: Payer: Self-pay | Admitting: Hematology and Oncology

## 2016-07-26 ENCOUNTER — Ambulatory Visit (HOSPITAL_BASED_OUTPATIENT_CLINIC_OR_DEPARTMENT_OTHER): Payer: PPO

## 2016-07-26 ENCOUNTER — Ambulatory Visit: Payer: PPO | Admitting: Hematology and Oncology

## 2016-07-26 ENCOUNTER — Other Ambulatory Visit (HOSPITAL_BASED_OUTPATIENT_CLINIC_OR_DEPARTMENT_OTHER): Payer: PPO

## 2016-07-26 ENCOUNTER — Other Ambulatory Visit: Payer: PPO

## 2016-07-26 DIAGNOSIS — R112 Nausea with vomiting, unspecified: Secondary | ICD-10-CM

## 2016-07-26 DIAGNOSIS — C50211 Malignant neoplasm of upper-inner quadrant of right female breast: Secondary | ICD-10-CM

## 2016-07-26 DIAGNOSIS — R52 Pain, unspecified: Secondary | ICD-10-CM

## 2016-07-26 DIAGNOSIS — R530 Neoplastic (malignant) related fatigue: Secondary | ICD-10-CM | POA: Diagnosis not present

## 2016-07-26 DIAGNOSIS — R51 Headache: Secondary | ICD-10-CM | POA: Diagnosis not present

## 2016-07-26 DIAGNOSIS — R252 Cramp and spasm: Secondary | ICD-10-CM

## 2016-07-26 DIAGNOSIS — Z95828 Presence of other vascular implants and grafts: Secondary | ICD-10-CM

## 2016-07-26 DIAGNOSIS — Z170 Estrogen receptor positive status [ER+]: Principal | ICD-10-CM

## 2016-07-26 LAB — COMPREHENSIVE METABOLIC PANEL
ALBUMIN: 3.2 g/dL — AB (ref 3.5–5.0)
ALK PHOS: 86 U/L (ref 40–150)
ALT: 13 U/L (ref 0–55)
AST: 22 U/L (ref 5–34)
Anion Gap: 10 mEq/L (ref 3–11)
BUN: 9.9 mg/dL (ref 7.0–26.0)
CHLORIDE: 106 meq/L (ref 98–109)
CO2: 24 meq/L (ref 22–29)
Calcium: 9.6 mg/dL (ref 8.4–10.4)
Creatinine: 1.3 mg/dL — ABNORMAL HIGH (ref 0.6–1.1)
EGFR: 49 mL/min/{1.73_m2} — ABNORMAL LOW (ref >90)
GLUCOSE: 91 mg/dL (ref 70–140)
POTASSIUM: 3.9 meq/L (ref 3.5–5.1)
SODIUM: 140 meq/L (ref 136–145)
Total Bilirubin: 0.43 mg/dL (ref 0.20–1.20)
Total Protein: 7.1 g/dL (ref 6.4–8.3)

## 2016-07-26 LAB — CBC WITH DIFFERENTIAL/PLATELET
BASO%: 0.4 % (ref 0.0–2.0)
BASOS ABS: 0 10*3/uL (ref 0.0–0.1)
EOS%: 0.1 % (ref 0.0–7.0)
Eosinophils Absolute: 0 10*3/uL (ref 0.0–0.5)
HCT: 33.7 % — ABNORMAL LOW (ref 34.8–46.6)
HEMOGLOBIN: 10.7 g/dL — AB (ref 11.6–15.9)
LYMPH%: 12 % — ABNORMAL LOW (ref 14.0–49.7)
MCH: 27.7 pg (ref 25.1–34.0)
MCHC: 31.7 g/dL (ref 31.5–36.0)
MCV: 87.5 fL (ref 79.5–101.0)
MONO#: 1.8 10*3/uL — ABNORMAL HIGH (ref 0.1–0.9)
MONO%: 12.9 % (ref 0.0–14.0)
NEUT#: 10.2 10*3/uL — ABNORMAL HIGH (ref 1.5–6.5)
NEUT%: 74.6 % (ref 38.4–76.8)
Platelets: 192 10*3/uL (ref 145–400)
RBC: 3.85 10*6/uL (ref 3.70–5.45)
RDW: 13.7 % (ref 11.2–14.5)
WBC: 13.6 10*3/uL — AB (ref 3.9–10.3)
lymph#: 1.6 10*3/uL (ref 0.9–3.3)

## 2016-07-26 MED ORDER — HEPARIN SOD (PORK) LOCK FLUSH 100 UNIT/ML IV SOLN
500.0000 [IU] | INTRAVENOUS | Status: AC | PRN
  Administered 2016-07-26: 500 [IU]
  Filled 2016-07-26: qty 5

## 2016-07-26 MED ORDER — SODIUM CHLORIDE 0.9% FLUSH
10.0000 mL | INTRAVENOUS | Status: AC | PRN
  Administered 2016-07-26: 10 mL
  Filled 2016-07-26: qty 10

## 2016-07-26 NOTE — Progress Notes (Signed)
[name], MD as PCP - General (Family Medicine) [name], MD as Consulting Physician (Cardiology)  DIAGNOSIS:  Encounter Diagnosis  Name Primary?  . Malignant neoplasm of upper-inner quadrant of right breast in female, estrogen receptor positive ([place])     SUMMARY OF ONCOLOGIC HISTORY:   Breast cancer of upper-inner quadrant of right female breast ([place])   06/15/2016 Mammogram    Right breast irregular mass UIQ with diffuse skin thickening that is palpable, additional pleomorphic calcifications 2.8 x 2.1 x 3.5 cm extending posteriorly for another 2 cm; left breast 8 mm circumscribed nodule       06/21/2016 Initial Diagnosis    Right breast biopsy UIQ: IDC grade 2 with papillary features ER 100%, PR 100%, Ki-67 15%, HER-2 negative ratio 1.21; right axillary LN biopsy metastatic carcinoma in one lymph node with ECE, ER 45%, PR 0%, Ki-67 20%, HER-2 negative ratio 1.86      06/28/2016 Procedure    Left breast biopsy: Fibroadenoma      07/16/2016 PET scan    Right breast primary inflammatory breast cancer, right axillary lymph node metastases, T7 bone metastases, indeterminate focus in the left medial breast 7 mm, right adrenal nodule      07/19/2016 -  Neo-Adjuvant Chemotherapy    Neoadjuvant Taxotere and Cytoxan every 3 weeks 4 cycles       CHIEF COMPLIANT: Severe fatigue, nausea, vomiting, muscle cramps, 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 After the chemotherapy patient had nausea issues. She took antiemetics and developed headaches. It appears that the nausea appears to improve but then it comes back. She has not been eating or drinking a whole lot for the past week. She has been feeling weaker. Her family tells me that she is not drinking enough liquids. She has been taking muscle  relaxant for the cramps.  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  I have reviewed the past medical history, past surgical history, social history and family history with the patient and they are unchanged from previous note.  ALLERGIES:  is allergic to lisinopril and eggs or egg-derived products.  MEDICATIONS:  Current Outpatient Prescriptions  Medication Sig Dispense Refill  . cholecalciferol (VITAMIN D) 1000 UNITS tablet Take 1,000 Units by mouth daily.    Marland Kitchen gabapentin (NEURONTIN) 300 MG capsule Take 1 capsule (300 mg total) by mouth 3 (three) times daily. 90 capsule 2  . hydrochlorothiazide (HYDRODIURIL) 12.5 MG tablet Take 1 tablet (12.5 mg total) by mouth daily. 90 tablet 3  . latanoprost (XALATAN) 0.005 % ophthalmic solution Place 1 drop into both eyes at bedtime.     . lidocaine-prilocaine (EMLA) cream Apply to affected area once (Patient taking differently: Apply 1 application topically daily as needed (prior to port access). Apply to affected area once) 30 g 3  . losartan (COZAAR) 100  MG tablet Take 1 tablet (100 mg total) by mouth daily. Must have labs and appt for any further refills please 30 tablet 1  . ondansetron (ZOFRAN) 8 MG tablet Take 1 tablet (8 mg total) by mouth 2 (two) times daily as needed for refractory nausea / vomiting. Start on day 3 after chemo. 30 tablet 1  .  oxyCODONE (OXY IR/ROXICODONE) 5 MG immediate release tablet Take 1-2 tablets (5-10 mg total) by mouth every 4 (four) hours as needed for moderate pain. 40 tablet 0  . oxyCODONE-acetaminophen (PERCOCET) 10-325 MG tablet Take 0.5-1 tablets by mouth every 6 (six) hours as needed for pain. 20 tablet 0  . pantoprazole (PROTONIX) 40 MG tablet Take 1 tablet (40 mg total) by mouth daily as needed. Caution:prolonged use may increase risk of pneumonia, colitis, osteoporosis, anemia (Patient taking differently: Take 40 mg by mouth daily as needed (for acid reflux/gerd). Caution:prolonged use may increase risk of pneumonia, colitis, osteoporosis, anemia) 90 tablet 0  . potassium chloride (KLOR-CON M10) 10 MEQ tablet Take 1 tablet (10 mEq total) by mouth 3 (three) times daily. 270 tablet 1  . prochlorperazine (COMPAZINE) 10 MG tablet Take 1 tablet (10 mg total) by mouth every 6 (six) hours as needed for nausea or vomiting. 30 tablet 3  . promethazine (PHENERGAN) 12.5 MG tablet Take 12.5 mg by mouth every 6 (six) hours as needed for nausea or vomiting.    . tizanidine (ZANAFLEX) 2 MG capsule Take 2 capsules (4 mg total) by mouth 3 (three) times daily as needed for muscle spasms. 30 capsule 1  . warfarin (COUMADIN) 4 MG tablet Take 12 mg by mouth as directed. Daily with the exception of Fridays     No current facility-administered medications for this visit.     PHYSICAL EXAMINATION: ECOG PERFORMANCE STATUS: 2 - Symptomatic, <50% confined to bed  There were no vitals filed for this visit. There were no vitals filed for this visit.  GENERAL:alert, no distress and comfortable SKIN: skin color, texture, turgor are normal, no rashes or significant lesions EYES: normal, Conjunctiva are pink and non-injected, sclera clear OROPHARYNX:no exudate, no erythema and lips, buccal mucosa, and tongue normal  NECK: supple, thyroid normal size, non-tender, without nodularity LYMPH:  no palpable lymphadenopathy in the  cervical, axillary or inguinal LUNGS: clear to auscultation and percussion with normal breathing effort HEART: regular rate & rhythm and no murmurs and no lower extremity edema ABDOMEN:abdomen soft, non-tender and normal bowel sounds MUSCULOSKELETAL:no cyanosis of digits and no clubbing  NEURO: alert & oriented x 3 with fluent speech, no focal motor/sensory deficits EXTREMITIES: No lower extremity edema  LABORATORY DATA:  I have reviewed the data as listed   Chemistry      Component Value Date/Time   NA 141 07/16/2016 1506   NA 139 07/22/2015 1437   K 3.8 07/16/2016 1506   CL 108 07/16/2016 1506   CO2 26 07/16/2016 1506   BUN 14 07/16/2016 1506   BUN 19 07/22/2015 1437   CREATININE 1.32 (H) 07/16/2016 1506   CREATININE 1.49 (H) 05/30/2016 1659   GLU 98 11/26/2014      Component Value Date/Time   CALCIUM 9.6 07/16/2016 1506   ALKPHOS 56 05/25/2016 1554   AST 27 05/25/2016 1554   ALT 17 05/25/2016 1554   BILITOT 0.5 05/25/2016 1554   BILITOT 0.4 07/22/2015 1437       Lab Results  Component Value Date   WBC 5.8 07/16/2016   HGB 12.0 07/16/2016  HCT 37.9 07/16/2016   MCV 90.7 07/16/2016   PLT 266 07/16/2016   NEUTROABS 3.1 05/25/2016     ASSESSMENT & PLAN:  Breast cancer of upper-inner quadrant of right female breast ([place]) 06/15/2016: Right breast irregular mass UIQ with diffuse skin thickening that is palpable, additional pleomorphic calcifications 2.8 x 2.1 x 3.5 cm extending posteriorly for another 2 cm; left breast 8 mm circumscribed nodule  06/21/2016: Right breast biopsy UIQ: IDC grade 2 with papillary features ER 100%, PR 100%, Ki-67 15%, HER-2 negative ratio 1.21; right axillary LN biopsy metastatic carcinoma in one lymph node with ECE, ER 45%, PR 0%, Ki-67 20%, HER-2 negative ratio 1.86  07/16/2016: PET/CT scan: Right breast inflammatory breast cancer, T7 osseous metastases, indeterminate focus medial left breast corresponding to 7 mm calcified soft tissue  nodule, right adrenal adenoma  Clinical stage: T4 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 followed by mastectomy with axillary node dissection 3. Followed by adjuvant radiation including radiation to T7 vertebral body 4. Followed by antiestrogen therapy with anastrozole 10 years ---------------------------------------------------------------------------------------------------------------------------------------------------------------- Radiology review: I discussed with the patient the result of the PET CT scan showing hypermetabolic is involving the T7 vertebra consistent with metastatic disease with an SUV of 14. That is the only site of metastatic disease. Because of this we elected to pursue with more aggressive systemic therapy approach with Taxotere and Cytoxan.  Current treatment: Cycle 1 day 8 Taxotere and Cytoxan Goal of treatment: Although cure is not possible for stage IV breast cancer, we are treating her with a curative intent to cause she only has a solitary site of metastatic disease.  Chemo toxicities:  1. Nausea and vomiting grade 2: I instructed her to stop the Zofran because it was causing headaches and use Compazine alone. I did use the dosage of cycle 2 of chemotherapy. 2. severe fatigue and decreased oral intake: I emphasizes that if she does not drink enough water and stay hydrated, I may have to stop her treatment. 3. Headaches: Instructed her to stop Zofran 4. Body cramps and pains: I instructed her to take Claritin with the next cycle of chemotherapy.  monitoring closely for chemotherapy related toxicities.  Return to clinic in 2 weeks for cycle 2   No orders of the defined types were placed in this encounter.  The patient has a good understanding of the overall plan. she agrees with it. she will call with any problems that may develop before  the next visit here.   Rulon Eisenmenger, MD 07/26/16

## 2016-07-26 NOTE — Patient Instructions (Signed)

## 2016-07-26 NOTE — Assessment & Plan Note (Signed)
06/15/2016: Right breast irregular mass UIQ with diffuse skin thickening that is palpable, additional pleomorphic calcifications 2.8 x 2.1 x 3.5 cm extending posteriorly for another 2 cm; left breast 8 mm circumscribed nodule  06/21/2016: Right breast biopsy UIQ: IDC grade 2 with papillary features ER 100%, PR 100%, Ki-67 15%, HER-2 negative ratio 1.21; right axillary LN biopsy metastatic carcinoma in one lymph node with ECE, ER 45%, PR 0%, Ki-67 20%, HER-2 negative ratio 1.86  07/16/2016: PET/CT scan: Right breast inflammatory breast cancer, T7 osseous metastases, indeterminate focus medial left breast corresponding to 7 mm calcified soft tissue nodule, right adrenal adenoma  Clinical stage: T4 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 followed by mastectomy with axillary node dissection 3. Followed by adjuvant radiation including radiation to T7 vertebral body 4. Followed by antiestrogen therapy with anastrozole 10 years ---------------------------------------------------------------------------------------------------------------------------------------------------------------- Radiology review: I discussed with the patient the result of the PET CT scan showing hypermetabolic is involving the T7 vertebra consistent with metastatic disease with an SUV of 14. That is the only site of metastatic disease. Because of this we elected to pursue with more aggressive systemic therapy approach with Taxotere and Cytoxan.  Current treatment: Cycle 1 day 8 Taxotere and Cytoxan Goal of treatment: Although cure is not possible for stage IV breast cancer, we are treating her with a curative intent to cause she only has a solitary site of metastatic disease.  Chemo toxicities:   monitoring closely for chemotherapy related toxicities.  Return to clinic in 2 weeks for cycle 2

## 2016-07-27 LAB — PROTIME-INR

## 2016-07-30 ENCOUNTER — Other Ambulatory Visit: Payer: Self-pay | Admitting: Family Medicine

## 2016-07-30 DIAGNOSIS — I10 Essential (primary) hypertension: Secondary | ICD-10-CM

## 2016-07-30 NOTE — Telephone Encounter (Signed)
Last Cr reviewed; Rx approved 

## 2016-08-01 ENCOUNTER — Telehealth: Payer: Self-pay

## 2016-08-01 NOTE — Telephone Encounter (Signed)
Notified patient to stay on current dose of coumadin 10mg  Friday and 12mg  all other days recheck in 1week.  Todays INR was 2.2

## 2016-08-02 ENCOUNTER — Other Ambulatory Visit: Payer: Self-pay | Admitting: Family Medicine

## 2016-08-02 ENCOUNTER — Encounter: Payer: Self-pay | Admitting: Family Medicine

## 2016-08-02 DIAGNOSIS — I10 Essential (primary) hypertension: Secondary | ICD-10-CM

## 2016-08-02 DIAGNOSIS — Z7901 Long term (current) use of anticoagulants: Secondary | ICD-10-CM

## 2016-08-02 DIAGNOSIS — M25569 Pain in unspecified knee: Secondary | ICD-10-CM

## 2016-08-02 DIAGNOSIS — Z5181 Encounter for therapeutic drug level monitoring: Secondary | ICD-10-CM

## 2016-08-02 MED ORDER — LOSARTAN POTASSIUM 100 MG PO TABS: 100.0000 mg | ORAL_TABLET | Freq: Every day | ORAL | 5 refills | Status: DC

## 2016-08-02 MED ORDER — WARFARIN SODIUM 4 MG PO TABS: 12.0000 mg | ORAL_TABLET | Freq: Every day | ORAL | 3 refills | Status: DC

## 2016-08-02 NOTE — Telephone Encounter (Signed)
Cr and K+ reviewed; ARB approved and sent on 07/30/16; will send again Sending warfarin as well

## 2016-08-03 ENCOUNTER — Institutional Professional Consult (permissible substitution): Payer: PPO | Admitting: Radiation Oncology

## 2016-08-03 ENCOUNTER — Ambulatory Visit: Payer: PPO

## 2016-08-03 NOTE — Telephone Encounter (Signed)
Error

## 2016-08-08 NOTE — Assessment & Plan Note (Signed)
06/15/2016: Right breast irregular mass UIQ with diffuse skin thickening that is palpable, additional pleomorphic calcifications 2.8 x 2.1 x 3.5 cm extending posteriorly for another 2 cm; left breast 8 mm circumscribed nodule  06/21/2016: Right breast biopsy UIQ: IDC grade 2 with papillary features ER 100%, PR 100%, Ki-67 15%, HER-2 negative ratio 1.21; right axillary LN biopsy metastatic carcinoma in one lymph node with ECE, ER 45%, PR 0%, Ki-67 20%, HER-2 negative ratio 1.86  07/16/2016: PET/CT scan: Right breast inflammatory breast cancer, T7 osseous metastases, indeterminate focus medial left breast corresponding to 7 mm calcified soft tissue nodule, right adrenal adenoma  Clinical stage: T4 N1M1stage IV  Treatment plan: 1. 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 followed by mastectomy with axillary node dissection 3. Followed by adjuvant radiation including radiation to T7 vertebral body 4. Followed by antiestrogen therapy with anastrozole 10 years ---------------------------------------------------------------------------------------------------------------------------------------------------------------- Radiology review: I discussed with the patient the result of the PET CT scan showing hypermetabolic is involving the T7 vertebra consistent with metastatic disease with an SUV of 14. That is the only site of metastatic disease. Because of this we elected to pursue with more aggressive systemic therapy approach with Taxotere and Cytoxan.  Current treatment: Cycle 2 day 1 Taxotere and Cytoxan Goal of treatment: Although cure is not possible for stage IV breast cancer, we are treating her with a curative intent to cause she only has a solitary site of metastatic disease.  Chemo toxicities:  1. Nausea and vomiting grade 2: I instructed her to stop the Zofran because it was causing headaches and use  Compazine alone. I did use the dosage of cycle 2 of chemotherapy. 2. severe fatigue and decreased oral intake: I emphasizes that if she does not drink enough water and stay hydrated, I may have to stop her treatment. 3. Headaches: Instructed her to stop Zofran 4. Body cramps and pains: I instructed her to take Claritin with the next cycle of chemotherapy.  monitoring closely for chemotherapy related toxicities.  Return to clinic in 3 weeks for cycle 3

## 2016-08-09 ENCOUNTER — Ambulatory Visit: Payer: PPO | Admitting: Hematology and Oncology

## 2016-08-09 ENCOUNTER — Ambulatory Visit (HOSPITAL_BASED_OUTPATIENT_CLINIC_OR_DEPARTMENT_OTHER): Payer: PPO

## 2016-08-09 ENCOUNTER — Ambulatory Visit: Payer: PPO

## 2016-08-09 ENCOUNTER — Encounter: Payer: Self-pay | Admitting: Hematology and Oncology

## 2016-08-09 ENCOUNTER — Telehealth: Payer: Self-pay

## 2016-08-09 ENCOUNTER — Other Ambulatory Visit (HOSPITAL_BASED_OUTPATIENT_CLINIC_OR_DEPARTMENT_OTHER): Payer: PPO

## 2016-08-09 ENCOUNTER — Ambulatory Visit (HOSPITAL_BASED_OUTPATIENT_CLINIC_OR_DEPARTMENT_OTHER): Payer: PPO | Admitting: Hematology and Oncology

## 2016-08-09 ENCOUNTER — Other Ambulatory Visit: Payer: PPO

## 2016-08-09 DIAGNOSIS — C50211 Malignant neoplasm of upper-inner quadrant of right female breast: Secondary | ICD-10-CM

## 2016-08-09 DIAGNOSIS — C7951 Secondary malignant neoplasm of bone: Secondary | ICD-10-CM

## 2016-08-09 DIAGNOSIS — C773 Secondary and unspecified malignant neoplasm of axilla and upper limb lymph nodes: Secondary | ICD-10-CM

## 2016-08-09 DIAGNOSIS — Z170 Estrogen receptor positive status [ER+]: Principal | ICD-10-CM

## 2016-08-09 DIAGNOSIS — Z8679 Personal history of other diseases of the circulatory system: Secondary | ICD-10-CM | POA: Diagnosis not present

## 2016-08-09 DIAGNOSIS — Z5111 Encounter for antineoplastic chemotherapy: Secondary | ICD-10-CM | POA: Diagnosis not present

## 2016-08-09 DIAGNOSIS — Z95828 Presence of other vascular implants and grafts: Secondary | ICD-10-CM

## 2016-08-09 LAB — CBC WITH DIFFERENTIAL/PLATELET
BASO%: 1.4 % (ref 0.0–2.0)
BASOS ABS: 0.1 10*3/uL (ref 0.0–0.1)
EOS%: 0.1 % (ref 0.0–7.0)
Eosinophils Absolute: 0 10*3/uL (ref 0.0–0.5)
HCT: 30.6 % — ABNORMAL LOW (ref 34.8–46.6)
HGB: 10 g/dL — ABNORMAL LOW (ref 11.6–15.9)
LYMPH%: 22.3 % (ref 14.0–49.7)
MCH: 28.5 pg (ref 25.1–34.0)
MCHC: 32.5 g/dL (ref 31.5–36.0)
MCV: 87.8 fL (ref 79.5–101.0)
MONO#: 0.7 10*3/uL (ref 0.1–0.9)
MONO%: 15.2 % — AB (ref 0.0–14.0)
NEUT#: 3 10*3/uL (ref 1.5–6.5)
NEUT%: 61 % (ref 38.4–76.8)
Platelets: 356 10*3/uL (ref 145–400)
RBC: 3.49 10*6/uL — ABNORMAL LOW (ref 3.70–5.45)
RDW: 14.4 % (ref 11.2–14.5)
WBC: 4.9 10*3/uL (ref 3.9–10.3)
lymph#: 1.1 10*3/uL (ref 0.9–3.3)

## 2016-08-09 LAB — COMPREHENSIVE METABOLIC PANEL
ALT: 13 U/L (ref 0–55)
AST: 21 U/L (ref 5–34)
Albumin: 3 g/dL — ABNORMAL LOW (ref 3.5–5.0)
Alkaline Phosphatase: 79 U/L (ref 40–150)
Anion Gap: 6 mEq/L (ref 3–11)
BUN: 15.3 mg/dL (ref 7.0–26.0)
CHLORIDE: 106 meq/L (ref 98–109)
CO2: 27 mEq/L (ref 22–29)
Calcium: 9.5 mg/dL (ref 8.4–10.4)
Creatinine: 1.3 mg/dL — ABNORMAL HIGH (ref 0.6–1.1)
EGFR: 52 mL/min/{1.73_m2} — AB (ref >90)
GLUCOSE: 94 mg/dL (ref 70–140)
POTASSIUM: 3.7 meq/L (ref 3.5–5.1)
SODIUM: 140 meq/L (ref 136–145)
Total Bilirubin: 0.38 mg/dL (ref 0.20–1.20)
Total Protein: 6.9 g/dL (ref 6.4–8.3)

## 2016-08-09 LAB — PROTIME-INR
INR: 4.9 — AB (ref 2.00–3.50)
PROTIME: 58.8 s — AB (ref 10.6–13.4)

## 2016-08-09 MED ORDER — PALONOSETRON HCL INJECTION 0.25 MG/5ML
0.2500 mg | Freq: Once | INTRAVENOUS | Status: AC
  Administered 2016-08-09: 0.25 mg via INTRAVENOUS

## 2016-08-09 MED ORDER — DEXAMETHASONE SODIUM PHOSPHATE 10 MG/ML IJ SOLN
INTRAMUSCULAR | Status: AC
  Filled 2016-08-09: qty 1

## 2016-08-09 MED ORDER — DOCETAXEL CHEMO INJECTION 160 MG/16ML
65.0000 mg/m2 | Freq: Once | INTRAVENOUS | Status: AC
  Administered 2016-08-09: 150 mg via INTRAVENOUS
  Filled 2016-08-09: qty 15

## 2016-08-09 MED ORDER — SODIUM CHLORIDE 0.9% FLUSH
10.0000 mL | INTRAVENOUS | Status: DC | PRN
  Administered 2016-08-09: 10 mL
  Filled 2016-08-09: qty 10

## 2016-08-09 MED ORDER — HEPARIN SOD (PORK) LOCK FLUSH 100 UNIT/ML IV SOLN
500.0000 [IU] | Freq: Once | INTRAVENOUS | Status: AC | PRN
  Administered 2016-08-09: 500 [IU]
  Filled 2016-08-09: qty 5

## 2016-08-09 MED ORDER — PEGFILGRASTIM 6 MG/0.6ML ~~LOC~~ PSKT
6.0000 mg | PREFILLED_SYRINGE | Freq: Once | SUBCUTANEOUS | Status: AC
  Administered 2016-08-09: 6 mg via SUBCUTANEOUS
  Filled 2016-08-09: qty 0.6

## 2016-08-09 MED ORDER — DEXAMETHASONE SODIUM PHOSPHATE 10 MG/ML IJ SOLN
10.0000 mg | Freq: Once | INTRAMUSCULAR | Status: AC
  Administered 2016-08-09: 10 mg via INTRAVENOUS

## 2016-08-09 MED ORDER — SODIUM CHLORIDE 0.9 % IV SOLN
500.0000 mg/m2 | Freq: Once | INTRAVENOUS | Status: AC
  Administered 2016-08-09: 1160 mg via INTRAVENOUS
  Filled 2016-08-09: qty 58

## 2016-08-09 MED ORDER — SODIUM CHLORIDE 0.9 % IV SOLN
Freq: Once | INTRAVENOUS | Status: AC
  Administered 2016-08-09: 13:00:00 via INTRAVENOUS

## 2016-08-09 MED ORDER — SODIUM CHLORIDE 0.9% FLUSH
10.0000 mL | INTRAVENOUS | Status: DC | PRN
  Administered 2016-08-09: 10 mL via INTRAVENOUS
  Filled 2016-08-09: qty 10

## 2016-08-09 MED ORDER — PALONOSETRON HCL INJECTION 0.25 MG/5ML
INTRAVENOUS | Status: AC
  Filled 2016-08-09: qty 5

## 2016-08-09 NOTE — Patient Instructions (Addendum)
HOLD COUMADIN TODAY AND TOMORROW AND RESUME ON Saturday AT ORDERED DOSE PER DR [place] Discharge Instructions for Patients Receiving Chemotherapy  Today you received the following chemotherapy agents: Taxotere and Cytoxan  To help prevent nausea and vomiting after your treatment, we encourage you to take your nausea medication as directed.    If you develop nausea and vomiting that is not controlled by your nausea medication, call the clinic.   BELOW ARE SYMPTOMS THAT SHOULD BE REPORTED IMMEDIATELY:  *FEVER GREATER THAN 100.5 F  *CHILLS WITH OR WITHOUT FEVER  NAUSEA AND VOMITING THAT IS NOT CONTROLLED WITH YOUR NAUSEA MEDICATION  *UNUSUAL SHORTNESS OF BREATH  *UNUSUAL BRUISING OR BLEEDING  TENDERNESS IN MOUTH AND THROAT WITH OR WITHOUT PRESENCE OF ULCERS  *URINARY PROBLEMS  *BOWEL PROBLEMS  UNUSUAL RASH Items with * indicate a potential emergency and should be followed up as soon as possible.  Feel free to call the clinic you have any questions or concerns. The clinic phone number is [phone].  Please show the [facility] at check-in to the Emergency Department and triage nurse.

## 2016-08-09 NOTE — Progress Notes (Signed)
[name], MD as PCP - General (Family Medicine) [name], MD as Consulting Physician (Cardiology)  DIAGNOSIS:  Encounter Diagnosis  Name Primary?  . Malignant neoplasm of upper-inner quadrant of right breast in female, estrogen receptor positive ([place])     SUMMARY OF ONCOLOGIC HISTORY:   Breast cancer of upper-inner quadrant of right female breast ([place])   06/15/2016 Mammogram    Right breast irregular mass UIQ with diffuse skin thickening that is palpable, additional pleomorphic calcifications 2.8 x 2.1 x 3.5 cm extending posteriorly for another 2 cm; left breast 8 mm circumscribed nodule       06/21/2016 Initial Diagnosis    Right breast biopsy UIQ: IDC grade 2 with papillary features ER 100%, PR 100%, Ki-67 15%, HER-2 negative ratio 1.21; right axillary LN biopsy metastatic carcinoma in one lymph node with ECE, ER 45%, PR 0%, Ki-67 20%, HER-2 negative ratio 1.86      06/28/2016 Procedure    Left breast biopsy: Fibroadenoma      07/16/2016 PET scan    Right breast primary inflammatory breast cancer, right axillary lymph node metastases, T7 bone metastases, indeterminate focus in the left medial breast 7 mm, right adrenal nodule      07/19/2016 -  Neo-Adjuvant Chemotherapy    Neoadjuvant Taxotere and Cytoxan every 3 weeks 4 cycles       CHIEF COMPLIANT: Cycle 2 neoadjuvant chemotherapy with Taxotere and Cytoxan  INTERVAL HISTORY: [name] is a 65 year old with above-mentioned history of right breast cancer currently on neoadjuvant chemotherapy and today is cycle 2 Taxotere and Cytoxan. After cycle 1 of chemotherapy, patient had nausea, fatigue and body aches and pains. All of these symptoms had improved subsequently. She is feeling a lot better today.   REVIEW OF SYSTEMS:   Constitutional: Denies fevers, chills or abnormal weight loss, complains of fatigue Eyes: Denies blurriness of vision Ears, nose, mouth, throat, and face:  Denies mucositis or sore throat Respiratory: Denies cough, dyspnea or wheezes Cardiovascular: Denies palpitation, chest discomfort Gastrointestinal:  Nausea Skin: Denies abnormal skin rashes Lymphatics: Denies new lymphadenopathy or easy bruising Neurological:Denies numbness, tingling or new weaknesses Behavioral/Psych: Mood is stable, no new changes  Extremities: No lower extremity edema  All other systems were reviewed with the patient and are negative.  I have reviewed the past medical history, past surgical history, social history and family history with the patient and they are unchanged from previous note.  ALLERGIES:  is allergic to lisinopril and eggs or egg-derived products.  MEDICATIONS:  Current Outpatient Prescriptions  Medication Sig Dispense Refill  . cholecalciferol (VITAMIN D) 1000 UNITS tablet Take 1,000 Units by mouth daily.    Marland Kitchen gabapentin (NEURONTIN) 300 MG capsule TAKE 1 CAPSULE (300 MG TOTAL) BY MOUTH 3 (THREE) TIMES DAILY. 90 capsule 5  . hydrochlorothiazide (HYDRODIURIL) 12.5 MG tablet Take 1 tablet (12.5 mg total) by mouth daily. 90 tablet 3  . latanoprost (XALATAN) 0.005 % ophthalmic solution Place 1 drop into both eyes at bedtime.     . lidocaine-prilocaine (EMLA) cream Apply to affected area once (Patient taking differently: Apply 1 application topically daily as needed (prior to port access). Apply to affected area once) 30 g 3  . losartan (COZAAR) 100 MG tablet Take 1 tablet (100 mg total) by mouth daily. 30 tablet 5  . ondansetron (ZOFRAN) 8 MG tablet Take 1 tablet (8 mg total) by mouth 2 (two) times daily as needed for refractory nausea / vomiting. Start on  day 3 after chemo. 30 tablet 1  . oxyCODONE (OXY IR/ROXICODONE) 5 MG immediate release tablet Take 1-2 tablets (5-10 mg total) by mouth every 4 (four) hours as needed for moderate pain. 40 tablet 0  . oxyCODONE-acetaminophen (PERCOCET) 10-325 MG tablet Take 0.5-1 tablets by mouth every 6 (six) hours as  needed for pain. 20 tablet 0  . pantoprazole (PROTONIX) 40 MG tablet Take 1 tablet (40 mg total) by mouth daily as needed. Caution:prolonged use may increase risk of pneumonia, colitis, osteoporosis, anemia (Patient taking differently: Take 40 mg by mouth daily as needed (for acid reflux/gerd). Caution:prolonged use may increase risk of pneumonia, colitis, osteoporosis, anemia) 90 tablet 0  . potassium chloride (KLOR-CON M10) 10 MEQ tablet Take 1 tablet (10 mEq total) by mouth 3 (three) times daily. 270 tablet 1  . prochlorperazine (COMPAZINE) 10 MG tablet Take 1 tablet (10 mg total) by mouth every 6 (six) hours as needed for nausea or vomiting. 30 tablet 3  . promethazine (PHENERGAN) 12.5 MG tablet Take 12.5 mg by mouth every 6 (six) hours as needed for nausea or vomiting.    . tizanidine (ZANAFLEX) 2 MG capsule Take 2 capsules (4 mg total) by mouth 3 (three) times daily as needed for muscle spasms. 30 capsule 1  . warfarin (COUMADIN) 4 MG tablet Take 3 tablets (12 mg total) by mouth daily. Or as otherwise directed by your doctor 90 tablet 3   No current facility-administered medications for this visit.     PHYSICAL EXAMINATION: ECOG PERFORMANCE STATUS: 1 - Symptomatic but completely ambulatory  Vitals:   08/09/16 1155  BP: (!) 162/91  Pulse: 81  Resp: 20  Temp: 98.2 F (36.8 C)   Filed Weights   08/09/16 1155  Weight: 247 lb 9.6 oz (112.3 kg)    GENERAL:alert, no distress and comfortable SKIN: skin color, texture, turgor are normal, no rashes or significant lesions EYES: normal, Conjunctiva are pink and non-injected, sclera clear OROPHARYNX:no exudate, no erythema and lips, buccal mucosa, and tongue normal  NECK: supple, thyroid normal size, non-tender, without nodularity LYMPH:  no palpable lymphadenopathy in the cervical, axillary or inguinal LUNGS: clear to auscultation and percussion with normal breathing effort HEART: regular rate & rhythm and no murmurs and no lower  extremity edema ABDOMEN:abdomen soft, non-tender and normal bowel sounds MUSCULOSKELETAL:no cyanosis of digits and no clubbing  NEURO: alert & oriented x 3 with fluent speech, no focal motor/sensory deficits EXTREMITIES: No lower extremity edema   LABORATORY DATA:  I have reviewed the data as listed   Chemistry      Component Value Date/Time   NA 140 08/09/2016 1128   K 3.7 08/09/2016 1128   CL 108 07/16/2016 1506   CO2 27 08/09/2016 1128   BUN 15.3 08/09/2016 1128   CREATININE 1.3 (H) 08/09/2016 1128   GLU 98 11/26/2014      Component Value Date/Time   CALCIUM 9.5 08/09/2016 1128   ALKPHOS 79 08/09/2016 1128   AST 21 08/09/2016 1128   ALT 13 08/09/2016 1128   BILITOT 0.38 08/09/2016 1128       Lab Results  Component Value Date   WBC 4.9 08/09/2016   HGB 10.0 (L) 08/09/2016   HCT 30.6 (L) 08/09/2016   MCV 87.8 08/09/2016   PLT 356 08/09/2016   NEUTROABS 3.0 08/09/2016     ASSESSMENT & PLAN:  Breast cancer of upper-inner quadrant of right female breast ([place]) 06/15/2016: Right breast irregular mass UIQ with diffuse skin thickening that  is palpable, additional pleomorphic calcifications 2.8 x 2.1 x 3.5 cm extending posteriorly for another 2 cm; left breast 8 mm circumscribed nodule  06/21/2016: Right breast biopsy UIQ: IDC grade 2 with papillary features ER 100%, PR 100%, Ki-67 15%, HER-2 negative ratio 1.21; right axillary LN biopsy metastatic carcinoma in one lymph node with ECE, ER 45%, PR 0%, Ki-67 20%, HER-2 negative ratio 1.86  07/16/2016: PET/CT scan: Right breast inflammatory breast cancer, T7 osseous metastases, indeterminate focus medial left breast corresponding to 7 mm calcified soft tissue nodule, right adrenal adenoma  Clinical stage: T4 N1M1stage IV  Treatment plan: 1. 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 followed by mastectomy with axillary node dissection 3.  Followed by adjuvant radiation including radiation to T7 vertebral body 4. Followed by antiestrogen therapy with anastrozole 10 years ---------------------------------------------------------------------------------------------------------------------------------------------------------------- Radiology review: I discussed with the patient the result of the PET CT scan showing hypermetabolic is involving the T7 vertebra consistent with metastatic disease with an SUV of 14. That is the only site of metastatic disease. Because of this we elected to pursue with more aggressive systemic therapy approach with Taxotere and Cytoxan.  Current treatment: Cycle 2 day 1 Taxotere and Cytoxan Goal of treatment: Although cure is not possible for stage IV breast cancer, we are treating her with a curative intent to cause she only has a solitary site of metastatic disease.  Chemo toxicities:  1. Nausea and vomiting grade 2: I instructed her to stop the Zofran because it was causing headaches and use Compazine alone. I did use the dosage of cycle 2 of chemotherapy. 2. severe fatigue and decreased oral intake: I emphasizes that if she does not drink enough water and stay hydrated, I may have to stop her treatment. 3. Headaches: Instructed her to stop Zofran 4. Body cramps and pains: I instructed her to take Claritin with the next cycle of chemotherapy. 5. Chemotherapy-induced anemia: Hemoglobin is 10 we are monitoring it closely.  monitoring closely for chemotherapy related toxicities.  Return to clinic in 3 weeks for cycle 3   Orders Placed This Encounter  Procedures  . Protime-INR    Standing Status:   Standing    Number of Occurrences:   10    Standing Expiration Date:   08/09/2017   The patient has a good understanding of the overall plan. she agrees with it. she will call with any problems that may develop before the next visit here.   Rulon Eisenmenger, MD 08/09/16

## 2016-08-09 NOTE — Progress Notes (Signed)
Patient notified per Dr. Lindi Adie to hold Coumadin today and tomorrow and to restart on Saturday at ordered dose.  Patient verbalizes an understanding of MD instructions.  Teach back done.

## 2016-08-09 NOTE — Telephone Encounter (Signed)
Received INR results from Dr. Lindi Adie who wishes for pt to stop coumadin x 2 days and resume at same dose with repeat labs in 3 weeks.  Report with this information given to [place], RN treating pt in infusion room.  Lorriane Shire, RN verbalized understanding and reported she would let pt know.

## 2016-08-09 NOTE — Patient Instructions (Signed)

## 2016-08-10 ENCOUNTER — Encounter: Payer: Self-pay | Admitting: *Deleted

## 2016-08-10 NOTE — Progress Notes (Signed)
[place] Psychosocial Distress Screening Clinical Social Work  Clinical Social Work was referred by distress screening protocol.  The patient scored a 7 on the Psychosocial Distress Thermometer which indicates severe distress. Clinical Social Worker phoned pt to assess for distress and other psychosocial needs. CSW made several attempts to phone pt, but phone just rings. No way to leave a message.   [facility] DISTRESS SCREENING 07/05/2016  Screening Type Initial Screening  Distress experienced in past week (1-10) 7  Practical problem type Transportation  Emotional problem type Depression;Nervousness/Anxiety;Adjusting to illness;Isolation/feeling alone;Feeling hopeless;Boredom;Adjusting to appearance changes  Information Concerns Type Lack of info about diagnosis;Lack of info about treatment  Physical Problem type Loss of appetitie  Physician notified of physical symptoms Yes  Referral to clinical social work Yes  Referral to dietition No    Clinical Social Worker follow up needed: No.  If yes, follow up plan:   Loren Racer, [place]  [facility] Phone: [phone] Fax: [phone]

## 2016-08-13 ENCOUNTER — Other Ambulatory Visit: Payer: Self-pay

## 2016-08-13 DIAGNOSIS — I10 Essential (primary) hypertension: Secondary | ICD-10-CM

## 2016-08-13 MED ORDER — HYDROCHLOROTHIAZIDE 12.5 MG PO TABS: 12.5000 mg | ORAL_TABLET | Freq: Every day | ORAL | 1 refills | Status: DC

## 2016-08-16 ENCOUNTER — Telehealth: Payer: Self-pay

## 2016-08-16 NOTE — Telephone Encounter (Signed)
Called patient about INR results.  She states they checked it at the cancer center on 11/2 and it was elevated to 4.9.  She said they tried to call us, but could not get a hold of [place]?  The dr. There told her to stop coumadin for 2 days and restart.  Today she checked level and it was 1.2.  She is currently on 12mg  everyday except Friday she take 10mg .  Please advise?

## 2016-08-16 NOTE — Telephone Encounter (Signed)
Have her take 12 mg today and every day and recheck on Monday and call us If she has not heard from [place] by Tuesday, call us Thank you

## 2016-08-17 NOTE — Telephone Encounter (Signed)
Patient notified

## 2016-08-27 ENCOUNTER — Telehealth: Payer: Self-pay | Admitting: Family Medicine

## 2016-08-27 ENCOUNTER — Telehealth: Payer: Self-pay | Admitting: *Deleted

## 2016-08-27 NOTE — Telephone Encounter (Signed)
Pt called with c/o muscle spasms, weakness, and cramps as wells as fatigue. Relate she also is having pain in her back, legs and arms. Wants Dr. Lindi Adie to be aware to discuss on Friday for appt. Discussed symptoms with Dr. Lindi Adie and pt request to devise plan on Friday for management of complaints. Recommends increasing fluid intake in the meantime.   Pt also requests assistance for "someone to come to my house to make sure I have groceries that will not have a co-pay, my insurance will charge $35 co-pay with every visit, I can't afford that". Discussed with pt that I would reach out to financial advocates and SW to see if there are services to meet her needs.  Denies further questions or needs at this time. Informed pt to call if symptoms worsen or with other concerns. Received verbal understanding. Contact information provided.

## 2016-08-27 NOTE — Telephone Encounter (Signed)
Pt requesting that you return her call on her home phone [phone]. She did not state what it was about.

## 2016-08-28 ENCOUNTER — Other Ambulatory Visit: Payer: Self-pay

## 2016-08-28 ENCOUNTER — Telehealth: Payer: Self-pay

## 2016-08-28 DIAGNOSIS — N6310 Unspecified lump in the right breast, unspecified quadrant: Secondary | ICD-10-CM

## 2016-08-28 NOTE — Telephone Encounter (Signed)
Called Pt regarding Pain medication. Mention to pt Dr. lada would  Prefer that she consult with her oncologist dealing with pain medication. Pt sound little disturb and hung up the phone.

## 2016-08-28 NOTE — Assessment & Plan Note (Signed)
06/15/2016: Right breast irregular mass UIQ with diffuse skin thickening that is palpable, additional pleomorphic calcifications 2.8 x 2.1 x 3.5 cm extending posteriorly for another 2 cm; left breast 8 mm circumscribed nodule  06/21/2016: Right breast biopsy UIQ: IDC grade 2 with papillary features ER 100%, PR 100%, Ki-67 15%, HER-2 negative ratio 1.21; right axillary LN biopsy metastatic carcinoma in one lymph node with ECE, ER 45%, PR 0%, Ki-67 20%, HER-2 negative ratio 1.86  07/16/2016: PET/CT scan: Right breast inflammatory breast cancer, T7 osseous metastases, indeterminate focus medial left breast corresponding to 7 mm calcified soft tissue nodule, right adrenal adenoma  Clinical stage: T4 N1M1stage IV  Treatment plan: 1. 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 followed by mastectomy with axillary node dissection 3. Followed by adjuvant radiation including radiation to T7 vertebral body 4. Followed by antiestrogen therapy with anastrozole 10 years ---------------------------------------------------------------------------------------------------------------------------------------------------------------- Radiology review: I discussed with the patient the result of the PET CT scan showing hypermetabolic is involving the T7 vertebra consistent with metastatic disease with an SUV of 14. That is the only site of metastatic disease. Because of this we elected to pursue with more aggressive systemic therapy approach with Taxotere and Cytoxan.  Current treatment: Cycle 3 day 1Taxotere and Cytoxan Goal of treatment: Although cure is not possible for stage IV breast cancer, we are treating her with a curative intent to cause she only has a solitary site of metastatic disease.  Chemo toxicities:  1. Nausea and vomiting grade 2: I instructed her to stop the Zofran because it was causing headaches and use  Compazine alone. I did use the dosage of cycle 2 of chemotherapy. 2. severe fatigue and decreased oral intake: I emphasizes that if she does not drink enough water and stay hydrated, I may have to stop her treatment. 3. Headaches: Instructed her to stop Zofran 4. Body cramps and pains: I instructed her to take Claritin with the next cycle of chemotherapy. 5. Chemotherapy-induced anemia: Hemoglobin is 10 we are monitoring it closely.  monitoring closely for chemotherapy related toxicities.  Return to clinic in 3 weeks for cycle 4

## 2016-08-28 NOTE — Telephone Encounter (Signed)
Already taken care of by [employer]

## 2016-08-31 ENCOUNTER — Ambulatory Visit (HOSPITAL_BASED_OUTPATIENT_CLINIC_OR_DEPARTMENT_OTHER): Payer: PPO

## 2016-08-31 ENCOUNTER — Encounter: Payer: Self-pay | Admitting: Hematology and Oncology

## 2016-08-31 ENCOUNTER — Other Ambulatory Visit: Payer: PPO

## 2016-08-31 ENCOUNTER — Ambulatory Visit: Payer: PPO | Admitting: Hematology and Oncology

## 2016-08-31 ENCOUNTER — Ambulatory Visit: Payer: PPO

## 2016-08-31 ENCOUNTER — Ambulatory Visit (HOSPITAL_BASED_OUTPATIENT_CLINIC_OR_DEPARTMENT_OTHER): Payer: PPO | Admitting: Hematology and Oncology

## 2016-08-31 ENCOUNTER — Other Ambulatory Visit (HOSPITAL_BASED_OUTPATIENT_CLINIC_OR_DEPARTMENT_OTHER): Payer: PPO

## 2016-08-31 ENCOUNTER — Encounter: Payer: Self-pay | Admitting: *Deleted

## 2016-08-31 DIAGNOSIS — I482 Chronic atrial fibrillation: Secondary | ICD-10-CM | POA: Diagnosis not present

## 2016-08-31 DIAGNOSIS — C50211 Malignant neoplasm of upper-inner quadrant of right female breast: Secondary | ICD-10-CM

## 2016-08-31 DIAGNOSIS — Z170 Estrogen receptor positive status [ER+]: Secondary | ICD-10-CM

## 2016-08-31 DIAGNOSIS — D6481 Anemia due to antineoplastic chemotherapy: Secondary | ICD-10-CM | POA: Diagnosis not present

## 2016-08-31 DIAGNOSIS — M79606 Pain in leg, unspecified: Secondary | ICD-10-CM

## 2016-08-31 DIAGNOSIS — Z5111 Encounter for antineoplastic chemotherapy: Secondary | ICD-10-CM

## 2016-08-31 DIAGNOSIS — C773 Secondary and unspecified malignant neoplasm of axilla and upper limb lymph nodes: Secondary | ICD-10-CM | POA: Diagnosis not present

## 2016-08-31 DIAGNOSIS — Z7901 Long term (current) use of anticoagulants: Secondary | ICD-10-CM | POA: Diagnosis not present

## 2016-08-31 DIAGNOSIS — M549 Dorsalgia, unspecified: Secondary | ICD-10-CM | POA: Diagnosis not present

## 2016-08-31 DIAGNOSIS — Z95828 Presence of other vascular implants and grafts: Secondary | ICD-10-CM | POA: Insufficient documentation

## 2016-08-31 LAB — COMPREHENSIVE METABOLIC PANEL
ALBUMIN: 2.9 g/dL — AB (ref 3.5–5.0)
ALK PHOS: 75 U/L (ref 40–150)
ALT: 20 U/L (ref 0–55)
AST: 28 U/L (ref 5–34)
Anion Gap: 9 mEq/L (ref 3–11)
BILIRUBIN TOTAL: 0.45 mg/dL (ref 0.20–1.20)
BUN: 17.3 mg/dL (ref 7.0–26.0)
CALCIUM: 10 mg/dL (ref 8.4–10.4)
CO2: 24 mEq/L (ref 22–29)
CREATININE: 1.4 mg/dL — AB (ref 0.6–1.1)
Chloride: 108 mEq/L (ref 98–109)
EGFR: 46 mL/min/{1.73_m2} — ABNORMAL LOW (ref >90)
Glucose: 99 mg/dl (ref 70–140)
Potassium: 3.6 mEq/L (ref 3.5–5.1)
Sodium: 141 mEq/L (ref 136–145)
TOTAL PROTEIN: 7.1 g/dL (ref 6.4–8.3)

## 2016-08-31 LAB — CBC WITH DIFFERENTIAL/PLATELET
BASO%: 1.8 % (ref 0.0–2.0)
BASOS ABS: 0.1 10*3/uL (ref 0.0–0.1)
EOS%: 0.6 % (ref 0.0–7.0)
Eosinophils Absolute: 0 10*3/uL (ref 0.0–0.5)
HEMATOCRIT: 28.5 % — AB (ref 34.8–46.6)
HEMOGLOBIN: 9.2 g/dL — AB (ref 11.6–15.9)
LYMPH#: 1 10*3/uL (ref 0.9–3.3)
LYMPH%: 21.1 % (ref 14.0–49.7)
MCH: 28.4 pg (ref 25.1–34.0)
MCHC: 32.4 g/dL (ref 31.5–36.0)
MCV: 87.6 fL (ref 79.5–101.0)
MONO#: 0.6 10*3/uL (ref 0.1–0.9)
MONO%: 13.3 % (ref 0.0–14.0)
NEUT%: 63.2 % (ref 38.4–76.8)
NEUTROS ABS: 2.9 10*3/uL (ref 1.5–6.5)
Platelets: 358 10*3/uL (ref 145–400)
RBC: 3.25 10*6/uL — ABNORMAL LOW (ref 3.70–5.45)
RDW: 15.7 % — AB (ref 11.2–14.5)
WBC: 4.6 10*3/uL (ref 3.9–10.3)

## 2016-08-31 LAB — PROTIME-INR
INR: 3.2 (ref 2.00–3.50)
INR: 3.3 — AB (ref 0.9–1.1)
PROTIME: 38.4 s — AB (ref 10.6–13.4)

## 2016-08-31 MED ORDER — DOCETAXEL CHEMO INJECTION 160 MG/16ML
60.0000 mg/m2 | Freq: Once | INTRAVENOUS | Status: AC
  Administered 2016-08-31: 140 mg via INTRAVENOUS
  Filled 2016-08-31: qty 14

## 2016-08-31 MED ORDER — DEXAMETHASONE SODIUM PHOSPHATE 10 MG/ML IJ SOLN
10.0000 mg | Freq: Once | INTRAMUSCULAR | Status: AC
  Administered 2016-08-31: 10 mg via INTRAVENOUS

## 2016-08-31 MED ORDER — PALONOSETRON HCL INJECTION 0.25 MG/5ML
INTRAVENOUS | Status: AC
  Filled 2016-08-31: qty 5

## 2016-08-31 MED ORDER — SODIUM CHLORIDE 0.9% FLUSH
10.0000 mL | INTRAVENOUS | Status: DC | PRN
  Administered 2016-08-31: 10 mL
  Filled 2016-08-31: qty 10

## 2016-08-31 MED ORDER — SODIUM CHLORIDE 0.9% FLUSH
10.0000 mL | INTRAVENOUS | Status: DC | PRN
  Administered 2016-08-31: 10 mL via INTRAVENOUS
  Filled 2016-08-31: qty 10

## 2016-08-31 MED ORDER — SODIUM CHLORIDE 0.9 % IV SOLN
400.0000 mg/m2 | Freq: Once | INTRAVENOUS | Status: AC
  Administered 2016-08-31: 920 mg via INTRAVENOUS
  Filled 2016-08-31: qty 46

## 2016-08-31 MED ORDER — PEGFILGRASTIM 6 MG/0.6ML ~~LOC~~ PSKT
6.0000 mg | PREFILLED_SYRINGE | Freq: Once | SUBCUTANEOUS | Status: AC
  Administered 2016-08-31: 6 mg via SUBCUTANEOUS
  Filled 2016-08-31: qty 0.6

## 2016-08-31 MED ORDER — DEXAMETHASONE SODIUM PHOSPHATE 10 MG/ML IJ SOLN
INTRAMUSCULAR | Status: AC
  Filled 2016-08-31: qty 1

## 2016-08-31 MED ORDER — PALONOSETRON HCL INJECTION 0.25 MG/5ML
0.2500 mg | Freq: Once | INTRAVENOUS | Status: AC
  Administered 2016-08-31: 0.25 mg via INTRAVENOUS

## 2016-08-31 MED ORDER — SODIUM CHLORIDE 0.9 % IV SOLN
Freq: Once | INTRAVENOUS | Status: AC
  Administered 2016-08-31: 13:00:00 via INTRAVENOUS

## 2016-08-31 MED ORDER — HEPARIN SOD (PORK) LOCK FLUSH 100 UNIT/ML IV SOLN
500.0000 [IU] | Freq: Once | INTRAVENOUS | Status: AC | PRN
  Administered 2016-08-31: 500 [IU]
  Filled 2016-08-31: qty 5

## 2016-08-31 NOTE — Progress Notes (Signed)
[name], MD as PCP - General (Family Medicine) [name], MD as Consulting Physician (Cardiology)  DIAGNOSIS:  Encounter Diagnosis  Name Primary?  . Malignant neoplasm of upper-inner quadrant of right breast in female, estrogen receptor positive ([place])     SUMMARY OF ONCOLOGIC HISTORY:   Breast cancer of upper-inner quadrant of right female breast ([place])   06/15/2016 Mammogram    Right breast irregular mass UIQ with diffuse skin thickening that is palpable, additional pleomorphic calcifications 2.8 x 2.1 x 3.5 cm extending posteriorly for another 2 cm; left breast 8 mm circumscribed nodule       06/21/2016 Initial Diagnosis    Right breast biopsy UIQ: IDC grade 2 with papillary features ER 100%, PR 100%, Ki-67 15%, HER-2 negative ratio 1.21; right axillary LN biopsy metastatic carcinoma in one lymph node with ECE, ER 45%, PR 0%, Ki-67 20%, HER-2 negative ratio 1.86      06/28/2016 Procedure    Left breast biopsy: Fibroadenoma      07/16/2016 PET scan    Right breast primary inflammatory breast cancer, right axillary lymph node metastases, T7 bone metastases, indeterminate focus in the left medial breast 7 mm, right adrenal nodule      07/19/2016 -  Neo-Adjuvant Chemotherapy    Neoadjuvant Taxotere and Cytoxan every 3 weeks 4 cycles       CHIEF COMPLIANT: Nausea and vomiting with cycle 2 of chemotherapy, today is cycle 3 of Taxotere and Cytoxan  INTERVAL HISTORY: Christabelle Hanzlik is a 65 year old with above-mentioned history of right breast cancer currently on neoadjuvant chemotherapy. She is here to receive cycle 3 of chemotherapy. After cycle 2 she had profound nausea and vomiting. She is feeling better today. She complains of lots of intractable low back pain as well as some leg pains. She was prescribed oxycodone 5 mg a few weeks ago. She hasn't filled the medication but has not taken it. She has previous prescription of oxycodone 10 mg given  by a previous physician. She was informed by all the other doctors that I am the one to continue this prescription 0.4. The pain that she is describing is nothing to do with her cancer.  REVIEW OF SYSTEMS:   Constitutional: Denies fevers, chills or abnormal weight loss Eyes: Denies blurriness of vision Ears, nose, mouth, throat, and face: Denies mucositis or sore throat Respiratory: Denies cough, dyspnea or wheezes Cardiovascular: Denies palpitation, chest discomfort Gastrointestinal:  Denies nausea, heartburn or change in bowel habits Skin: Denies abnormal skin rashes Lymphatics: Denies new lymphadenopathy or easy bruising Neurological:Denies numbness, tingling or new weaknesses Behavioral/Psych: Mood is stable, no new changes  Extremities: No lower extremity edema  All other systems were reviewed with the patient and are negative.  I have reviewed the past medical history, past surgical history, social history and family history with the patient and they are unchanged from previous note.  ALLERGIES:  is allergic to lisinopril and eggs or egg-derived products.  MEDICATIONS:  Current Outpatient Prescriptions  Medication Sig Dispense Refill  . cholecalciferol (VITAMIN D) 1000 UNITS tablet Take 1,000 Units by mouth daily.    Marland Kitchen gabapentin (NEURONTIN) 300 MG capsule TAKE 1 CAPSULE (300 MG TOTAL) BY MOUTH 3 (THREE) TIMES DAILY. 90 capsule 5  . hydrochlorothiazide (HYDRODIURIL) 12.5 MG tablet Take 1 tablet (12.5 mg total) by mouth daily. 90 tablet 1  . latanoprost (XALATAN) 0.005 % ophthalmic solution Place 1 drop into both eyes at bedtime.     Marland Kitchen  lidocaine-prilocaine (EMLA) cream Apply to affected area once (Patient taking differently: Apply 1 application topically daily as needed (prior to port access). Apply to affected area once) 30 g 3  . losartan (COZAAR) 100 MG tablet Take 1 tablet (100 mg total) by mouth daily. 30 tablet 5  . ondansetron (ZOFRAN) 8 MG tablet Take 1 tablet (8 mg total)  by mouth 2 (two) times daily as needed for refractory nausea / vomiting. Start on day 3 after chemo. 30 tablet 1  . oxyCODONE (OXY IR/ROXICODONE) 5 MG immediate release tablet Take 1-2 tablets (5-10 mg total) by mouth every 4 (four) hours as needed for moderate pain. 40 tablet 0  . oxyCODONE-acetaminophen (PERCOCET) 10-325 MG tablet Take 0.5-1 tablets by mouth every 6 (six) hours as needed for pain. 20 tablet 0  . pantoprazole (PROTONIX) 40 MG tablet Take 1 tablet (40 mg total) by mouth daily as needed. Caution:prolonged use may increase risk of pneumonia, colitis, osteoporosis, anemia (Patient taking differently: Take 40 mg by mouth daily as needed (for acid reflux/gerd). Caution:prolonged use may increase risk of pneumonia, colitis, osteoporosis, anemia) 90 tablet 0  . potassium chloride (KLOR-CON M10) 10 MEQ tablet Take 1 tablet (10 mEq total) by mouth 3 (three) times daily. 270 tablet 1  . prochlorperazine (COMPAZINE) 10 MG tablet Take 1 tablet (10 mg total) by mouth every 6 (six) hours as needed for nausea or vomiting. 30 tablet 3  . promethazine (PHENERGAN) 12.5 MG tablet Take 12.5 mg by mouth every 6 (six) hours as needed for nausea or vomiting.    . tizanidine (ZANAFLEX) 2 MG capsule Take 2 capsules (4 mg total) by mouth 3 (three) times daily as needed for muscle spasms. 30 capsule 1  . warfarin (COUMADIN) 4 MG tablet Take 3 tablets (12 mg total) by mouth daily. Or as otherwise directed by your doctor 90 tablet 3   No current facility-administered medications for this visit.    Facility-Administered Medications Ordered in Other Visits  Medication Dose Route Frequency Provider Last Rate Last Dose  . 0.9 %  sodium chloride infusion   Intravenous Once Nicholas Lose, MD      . cyclophosphamide (CYTOXAN) 920 mg in sodium chloride 0.9 % 250 mL chemo infusion  400 mg/m2 (Treatment Plan Recorded) Intravenous Once Nicholas Lose, MD      . dexamethasone (DECADRON) injection 10 mg  10 mg Intravenous Once  Nicholas Lose, MD      . DOCEtaxel (TAXOTERE) 140 mg in dextrose 5 % 250 mL chemo infusion  60 mg/m2 (Treatment Plan Recorded) Intravenous Once Nicholas Lose, MD      . heparin lock flush 100 unit/mL  500 Units Intracatheter Once PRN Nicholas Lose, MD      . palonosetron (ALOXI) injection 0.25 mg  0.25 mg Intravenous Once Nicholas Lose, MD      . pegfilgrastim (NEULASTA ONPRO KIT) injection 6 mg  6 mg Subcutaneous Once Nicholas Lose, MD      . sodium chloride flush (NS) 0.9 % injection 10 mL  10 mL Intracatheter PRN Nicholas Lose, MD        PHYSICAL EXAMINATION: ECOG PERFORMANCE STATUS: 1 - Symptomatic but completely ambulatory  Vitals:   08/31/16 1207  BP: (!) 162/79  Pulse: 69  Resp: 18  Temp: 97.7 F (36.5 C)   Filed Weights   08/31/16 1207  Weight: 248 lb 3.2 oz (112.6 kg)    GENERAL:alert, no distress and comfortable SKIN: skin color, texture, turgor are normal, no rashes  or significant lesions EYES: normal, Conjunctiva are pink and non-injected, sclera clear OROPHARYNX:no exudate, no erythema and lips, buccal mucosa, and tongue normal  NECK: supple, thyroid normal size, non-tender, without nodularity LYMPH:  no palpable lymphadenopathy in the cervical, axillary or inguinal LUNGS: clear to auscultation and percussion with normal breathing effort HEART: regular rate & rhythm and no murmurs and no lower extremity edema ABDOMEN:abdomen soft, non-tender and normal bowel sounds MUSCULOSKELETAL:no cyanosis of digits and no clubbing  NEURO: alert & oriented x 3 with fluent speech, no focal motor/sensory deficits EXTREMITIES: No lower extremity edema  LABORATORY DATA:  I have reviewed the data as listed   Chemistry      Component Value Date/Time   NA 141 08/31/2016 1059   K 3.6 08/31/2016 1059   CL 108 07/16/2016 1506   CO2 24 08/31/2016 1059   BUN 17.3 08/31/2016 1059   CREATININE 1.4 (H) 08/31/2016 1059   GLU 98 11/26/2014      Component Value Date/Time   CALCIUM 10.0  08/31/2016 1059   ALKPHOS 75 08/31/2016 1059   AST 28 08/31/2016 1059   ALT 20 08/31/2016 1059   BILITOT 0.45 08/31/2016 1059       Lab Results  Component Value Date   WBC 4.6 08/31/2016   HGB 9.2 (L) 08/31/2016   HCT 28.5 (L) 08/31/2016   MCV 87.6 08/31/2016   PLT 358 08/31/2016   NEUTROABS 2.9 08/31/2016    ASSESSMENT & PLAN:  Breast cancer of upper-inner quadrant of right female breast ([place]) 06/15/2016: Right breast irregular mass UIQ with diffuse skin thickening that is palpable, additional pleomorphic calcifications 2.8 x 2.1 x 3.5 cm extending posteriorly for another 2 cm; left breast 8 mm circumscribed nodule  06/21/2016: Right breast biopsy UIQ: IDC grade 2 with papillary features ER 100%, PR 100%, Ki-67 15%, HER-2 negative ratio 1.21; right axillary LN biopsy metastatic carcinoma in one lymph node with ECE, ER 45%, PR 0%, Ki-67 20%, HER-2 negative ratio 1.86  07/16/2016: PET/CT scan: Right breast inflammatory breast cancer, T7 osseous metastases, indeterminate focus medial left breast corresponding to 7 mm calcified soft tissue nodule, right adrenal adenoma  Clinical stage: T4 N1M1stage IV  Treatment plan: 1. 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 followed by mastectomy with axillary node dissection 3. Followed by adjuvant radiation including radiation to T7 vertebral body 4. Followed by antiestrogen therapy with anastrozole 10 years ---------------------------------------------------------------------------------------------------------------------------------------------------------------- Radiology review: I discussed with the patient the result of the PET CT scan showing hypermetabolic is involving the T7 vertebra consistent with metastatic disease with an SUV of 14. That is the only site of metastatic disease. Because of this we elected to pursue with more aggressive systemic  therapy approach with Taxotere and Cytoxan.  Current treatment: Cycle 3 day 1Taxotere and Cytoxan Goal of treatment: Although cure is not possible for stage IV breast cancer, we are treating her with a curative intent to cause she only has a solitary site of metastatic disease.  Chemo toxicities:  1. Nausea and vomiting grade 2: I instructed her to stop the Zofran because it was causing headaches and use Compazine alone. I did use the dosage of cycle 2 of chemotherapy. 2. severe fatigue and decreased oral intake: I emphasizes that if she does not drink enough water and stay hydrated, I may have to stop her treatment. 3. Headaches: Instructed her to stop Zofran 4. Body cramps and pains: I instructed her to  take Claritin with the next cycle of chemotherapy. 5. Chemotherapy-induced anemia: Hemoglobin is 10 we are monitoring it closely.  monitoring closely for chemotherapy related toxicities.  Return to clinic in 3 weeks for cycle 4    No orders of the defined types were placed in this encounter.  The patient has a good understanding of the overall plan. she agrees with it. she will call with any problems that may develop before the next visit here.   Rulon Eisenmenger, MD 08/31/16

## 2016-08-31 NOTE — Patient Instructions (Signed)
May remove On-Pro injector tomorrow evening (09/01/2016) @ 8:30 pm.  [facility] Discharge Instructions for Patients Receiving Chemotherapy  Today you received the following chemotherapy agents:  Taxotere and Cytoxan.  To help prevent nausea and vomiting after your treatment, we encourage you to take your nausea medication as directed.   If you develop nausea and vomiting that is not controlled by your nausea medication, call the clinic.   BELOW ARE SYMPTOMS THAT SHOULD BE REPORTED IMMEDIATELY:  *FEVER GREATER THAN 100.5 F  *CHILLS WITH OR WITHOUT FEVER  NAUSEA AND VOMITING THAT IS NOT CONTROLLED WITH YOUR NAUSEA MEDICATION  *UNUSUAL SHORTNESS OF BREATH  *UNUSUAL BRUISING OR BLEEDING  TENDERNESS IN MOUTH AND THROAT WITH OR WITHOUT PRESENCE OF ULCERS  *URINARY PROBLEMS  *BOWEL PROBLEMS  UNUSUAL RASH Items with * indicate a potential emergency and should be followed up as soon as possible.  Feel free to call the clinic you have any questions or concerns. The clinic phone number is [phone].  Please show the [facility] at check-in to the Emergency Department and triage nurse.

## 2016-09-03 ENCOUNTER — Telehealth: Payer: Self-pay | Admitting: *Deleted

## 2016-09-03 ENCOUNTER — Telehealth: Payer: Self-pay | Admitting: Family Medicine

## 2016-09-03 NOTE — Telephone Encounter (Signed)
Thank you Let's have her decrease the dose slightly 10 mg Mondays and Fridays, and 12 mg all other days Recheck in 2 weeks Thank you

## 2016-09-03 NOTE — Telephone Encounter (Signed)
Pt called to r/s appt with Dr. Lindi Adie and chemo to Thursday 12/14 and after 1100 d/t transportation. R/s lab, flush and Dr. Lindi Adie appt to 12/14 at 1100 and sent request to have chemo moved. Pt relate she doesn't feel well. Asked pt if she would like to come in for fluids and see [facility]. Pt relate she did not need that at this time. Encourage pt to call if she feels worse of has n/v. Received verbal understanding.

## 2016-09-03 NOTE — Telephone Encounter (Signed)
Patient's INR on Friday was 3.3; a little above target Ask if any bleeding Verify how she's been taking it, if any missed doses, if any other changes to her medicine regimen or diet, then back to me Thank you

## 2016-09-03 NOTE — Telephone Encounter (Signed)
Patient called is currently taking 12mg  every day like you had asked.  No bleeding, diet or medication changes.

## 2016-09-03 NOTE — Telephone Encounter (Signed)
Patient notified

## 2016-09-04 ENCOUNTER — Encounter: Payer: Self-pay | Admitting: Family Medicine

## 2016-09-05 ENCOUNTER — Telehealth: Payer: Self-pay | Admitting: *Deleted

## 2016-09-05 ENCOUNTER — Encounter: Payer: Self-pay | Admitting: *Deleted

## 2016-09-05 NOTE — Progress Notes (Signed)
[name]  Clinical Social Work was referred by patient navigator for assessment of psychosocial needs.  Clinical Social Worker contacted patient at home to offer support and assess for needs.  Patient has expressed financial concerns and need for additional support at home.  Patient stated she can not afford the co-pay for in home care.  Patient has been approved for the [employer].  CSW and patient discussed patients needs and possible resources.  Resources may be limited due to availability and insurance.  CSW will continue to research possible resources and follow up with patient.           [name], MSW, LCSW, OSW-C Clinical Social Worker [facility] [phone]

## 2016-09-05 NOTE — Telephone Encounter (Signed)
Per staff message I have moved 12/15 treatment to 12/14

## 2016-09-10 LAB — PROTIME-INR: INR: 2 — AB (ref 0.9–1.1)

## 2016-09-17 LAB — PROTIME-INR

## 2016-09-19 NOTE — Assessment & Plan Note (Signed)
06/15/2016: Right breast irregular mass UIQ with diffuse skin thickening that is palpable, additional pleomorphic calcifications 2.8 x 2.1 x 3.5 cm extending posteriorly for another 2 cm; left breast 8 mm circumscribed nodule  06/21/2016: Right breast biopsy UIQ: IDC grade 2 with papillary features ER 100%, PR 100%, Ki-67 15%, HER-2 negative ratio 1.21; right axillary LN biopsy metastatic carcinoma in one lymph node with ECE, ER 45%, PR 0%, Ki-67 20%, HER-2 negative ratio 1.86  07/16/2016: PET/CT scan: Right breast inflammatory breast cancer, T7 osseous metastases, indeterminate focus medial left breast corresponding to 7 mm calcified soft tissue nodule, right adrenal adenoma  Clinical stage: T4 N1M1stage IV  Treatment plan: 1. 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 followed by mastectomy with axillary node dissection 3. Followed by adjuvant radiation including radiation to T7 vertebral body 4. Followed by antiestrogen therapy with anastrozole 10 years ---------------------------------------------------------------------------------------------------------------------------------------------------------------- Radiology review: I discussed with the patient the result of the PET CT scan showing hypermetabolic is involving the T7 vertebra consistent with metastatic disease with an SUV of 14. That is the only site of metastatic disease. Because of this we elected to pursue with more aggressive systemic therapy approach with Taxotere and Cytoxan.  Current treatment: Cycle 4day 1Taxotere and Cytoxan Goal of treatment: Although cure is not possible for stage IV breast cancer, we are treating her with a curative intent to cause she only has a solitary site of metastatic disease.  Chemo toxicities:  1. Nausea and vomiting grade 2: I instructed her to stop the Zofran because it was causing headaches and use  Compazine alone. I did use the dosage of cycle 2 of chemotherapy. 2. severe fatigue and decreased oral intake: I emphasizes that if she does not drink enough water and stay hydrated, I may have to stop her treatment. 3. Headaches: Instructed her to stop Zofran 4. Body cramps and pains: I instructed her to take Claritin with the next cycle of chemotherapy. 5. Chemotherapy-induced anemia: Hemoglobin is 10 we are monitoring it closely.  monitoring closely for chemotherapy related toxicities.  Return to clinic in 3weeks for cycle 4

## 2016-09-20 ENCOUNTER — Ambulatory Visit: Payer: PPO

## 2016-09-20 ENCOUNTER — Ambulatory Visit (HOSPITAL_BASED_OUTPATIENT_CLINIC_OR_DEPARTMENT_OTHER): Payer: PPO

## 2016-09-20 ENCOUNTER — Other Ambulatory Visit: Payer: PPO

## 2016-09-20 ENCOUNTER — Other Ambulatory Visit (HOSPITAL_BASED_OUTPATIENT_CLINIC_OR_DEPARTMENT_OTHER): Payer: PPO

## 2016-09-20 ENCOUNTER — Ambulatory Visit: Payer: PPO | Admitting: Hematology and Oncology

## 2016-09-20 ENCOUNTER — Ambulatory Visit (HOSPITAL_BASED_OUTPATIENT_CLINIC_OR_DEPARTMENT_OTHER): Payer: PPO | Admitting: Nurse Practitioner

## 2016-09-20 ENCOUNTER — Ambulatory Visit (HOSPITAL_BASED_OUTPATIENT_CLINIC_OR_DEPARTMENT_OTHER): Payer: PPO | Admitting: Hematology and Oncology

## 2016-09-20 ENCOUNTER — Encounter: Payer: Self-pay | Admitting: *Deleted

## 2016-09-20 ENCOUNTER — Encounter: Payer: Self-pay | Admitting: Hematology and Oncology

## 2016-09-20 ENCOUNTER — Encounter: Payer: Self-pay | Admitting: Nurse Practitioner

## 2016-09-20 VITALS — BP 147/82 | HR 79 | Resp 17

## 2016-09-20 DIAGNOSIS — Z170 Estrogen receptor positive status [ER+]: Secondary | ICD-10-CM

## 2016-09-20 DIAGNOSIS — Z7901 Long term (current) use of anticoagulants: Secondary | ICD-10-CM | POA: Diagnosis not present

## 2016-09-20 DIAGNOSIS — Z930 Tracheostomy status: Secondary | ICD-10-CM

## 2016-09-20 DIAGNOSIS — Z5111 Encounter for antineoplastic chemotherapy: Secondary | ICD-10-CM

## 2016-09-20 DIAGNOSIS — D6481 Anemia due to antineoplastic chemotherapy: Secondary | ICD-10-CM

## 2016-09-20 DIAGNOSIS — C7951 Secondary malignant neoplasm of bone: Secondary | ICD-10-CM

## 2016-09-20 DIAGNOSIS — C50211 Malignant neoplasm of upper-inner quadrant of right female breast: Secondary | ICD-10-CM

## 2016-09-20 DIAGNOSIS — Z95828 Presence of other vascular implants and grafts: Secondary | ICD-10-CM

## 2016-09-20 DIAGNOSIS — R062 Wheezing: Secondary | ICD-10-CM | POA: Diagnosis not present

## 2016-09-20 DIAGNOSIS — C773 Secondary and unspecified malignant neoplasm of axilla and upper limb lymph nodes: Secondary | ICD-10-CM | POA: Diagnosis not present

## 2016-09-20 DIAGNOSIS — R252 Cramp and spasm: Secondary | ICD-10-CM

## 2016-09-20 LAB — CBC WITH DIFFERENTIAL/PLATELET
BASO%: 0.6 % (ref 0.0–2.0)
BASOS ABS: 0 10*3/uL (ref 0.0–0.1)
EOS ABS: 0 10*3/uL (ref 0.0–0.5)
EOS%: 0.2 % (ref 0.0–7.0)
HEMATOCRIT: 26.1 % — AB (ref 34.8–46.6)
HEMOGLOBIN: 8.5 g/dL — AB (ref 11.6–15.9)
LYMPH%: 23.4 % (ref 14.0–49.7)
MCH: 29 pg (ref 25.1–34.0)
MCHC: 32.6 g/dL (ref 31.5–36.0)
MCV: 89.1 fL (ref 79.5–101.0)
MONO#: 0.6 10*3/uL (ref 0.1–0.9)
MONO%: 10.8 % (ref 0.0–14.0)
NEUT#: 3.3 10*3/uL (ref 1.5–6.5)
NEUT%: 65 % (ref 38.4–76.8)
Platelets: 344 10*3/uL (ref 145–400)
RBC: 2.93 10*6/uL — ABNORMAL LOW (ref 3.70–5.45)
RDW: 17.3 % — ABNORMAL HIGH (ref 11.2–14.5)
WBC: 5.1 10*3/uL (ref 3.9–10.3)
lymph#: 1.2 10*3/uL (ref 0.9–3.3)

## 2016-09-20 LAB — COMPREHENSIVE METABOLIC PANEL
ALBUMIN: 2.8 g/dL — AB (ref 3.5–5.0)
ALK PHOS: 66 U/L (ref 40–150)
ALT: 15 U/L (ref 0–55)
AST: 17 U/L (ref 5–34)
Anion Gap: 8 mEq/L (ref 3–11)
BUN: 19 mg/dL (ref 7.0–26.0)
CALCIUM: 9.3 mg/dL (ref 8.4–10.4)
CHLORIDE: 108 meq/L (ref 98–109)
CO2: 25 mEq/L (ref 22–29)
Creatinine: 1.1 mg/dL (ref 0.6–1.1)
EGFR: 61 mL/min/{1.73_m2} — AB (ref >90)
Glucose: 80 mg/dl (ref 70–140)
POTASSIUM: 3.8 meq/L (ref 3.5–5.1)
SODIUM: 141 meq/L (ref 136–145)
Total Bilirubin: 0.35 mg/dL (ref 0.20–1.20)
Total Protein: 7.1 g/dL (ref 6.4–8.3)

## 2016-09-20 LAB — PROTIME-INR
INR: 2.6 (ref 2.00–3.50)
PROTIME: 31.2 s — AB (ref 10.6–13.4)

## 2016-09-20 MED ORDER — LORAZEPAM 2 MG/ML IJ SOLN
INTRAMUSCULAR | Status: AC
Start: 2016-09-20 — End: 2016-09-20
  Filled 2016-09-20: qty 1

## 2016-09-20 MED ORDER — HEPARIN SOD (PORK) LOCK FLUSH 100 UNIT/ML IV SOLN
500.0000 [IU] | Freq: Once | INTRAVENOUS | Status: AC | PRN
  Administered 2016-09-20: 500 [IU]
  Filled 2016-09-20: qty 5

## 2016-09-20 MED ORDER — SODIUM CHLORIDE 0.9% FLUSH
10.0000 mL | INTRAVENOUS | Status: DC | PRN
  Administered 2016-09-20: 10 mL
  Filled 2016-09-20: qty 10

## 2016-09-20 MED ORDER — LORAZEPAM 2 MG/ML IJ SOLN
0.5000 mg | Freq: Once | INTRAMUSCULAR | Status: AC
  Administered 2016-09-20: 0.5 mg via INTRAVENOUS

## 2016-09-20 MED ORDER — PALONOSETRON HCL INJECTION 0.25 MG/5ML
0.2500 mg | Freq: Once | INTRAVENOUS | Status: AC
  Administered 2016-09-20: 0.25 mg via INTRAVENOUS

## 2016-09-20 MED ORDER — DEXAMETHASONE SODIUM PHOSPHATE 10 MG/ML IJ SOLN
10.0000 mg | Freq: Once | INTRAMUSCULAR | Status: AC
  Administered 2016-09-20: 10 mg via INTRAVENOUS

## 2016-09-20 MED ORDER — DEXAMETHASONE SODIUM PHOSPHATE 10 MG/ML IJ SOLN
INTRAMUSCULAR | Status: AC
  Filled 2016-09-20: qty 1

## 2016-09-20 MED ORDER — SODIUM CHLORIDE 0.9% FLUSH
10.0000 mL | INTRAVENOUS | Status: DC | PRN
Start: 2016-09-20 — End: 2016-09-20
  Administered 2016-09-20: 10 mL via INTRAVENOUS
  Filled 2016-09-20: qty 10

## 2016-09-20 MED ORDER — PEGFILGRASTIM 6 MG/0.6ML ~~LOC~~ PSKT
6.0000 mg | PREFILLED_SYRINGE | Freq: Once | SUBCUTANEOUS | Status: AC
  Administered 2016-09-20: 6 mg via SUBCUTANEOUS
  Filled 2016-09-20: qty 0.6

## 2016-09-20 MED ORDER — PALONOSETRON HCL INJECTION 0.25 MG/5ML
INTRAVENOUS | Status: AC
  Filled 2016-09-20: qty 5

## 2016-09-20 MED ORDER — ALBUTEROL SULFATE (2.5 MG/3ML) 0.083% IN NEBU
2.5000 mg | INHALATION_SOLUTION | Freq: Once | RESPIRATORY_TRACT | Status: AC
  Administered 2016-09-20: 2.5 mg via RESPIRATORY_TRACT
  Filled 2016-09-20: qty 3

## 2016-09-20 MED ORDER — SODIUM CHLORIDE 0.9 % IV SOLN
60.0000 mg/m2 | Freq: Once | INTRAVENOUS | Status: AC
  Administered 2016-09-20: 140 mg via INTRAVENOUS
  Filled 2016-09-20: qty 14

## 2016-09-20 MED ORDER — SODIUM CHLORIDE 0.9 % IV SOLN
Freq: Once | INTRAVENOUS | Status: AC
  Administered 2016-09-20: 14:00:00 via INTRAVENOUS

## 2016-09-20 MED ORDER — SODIUM CHLORIDE 0.9 % IV SOLN
400.0000 mg/m2 | Freq: Once | INTRAVENOUS | Status: AC
  Administered 2016-09-20: 920 mg via INTRAVENOUS
  Filled 2016-09-20: qty 46

## 2016-09-20 NOTE — Patient Instructions (Addendum)
[facility] Discharge Instructions for Patients Receiving Chemotherapy  Today you received the following chemotherapy agents: Taxotere and Cytoxan   To help prevent nausea and vomiting after your treatment, we encourage you to take your nausea medication as directed.    If you develop nausea and vomiting that is not controlled by your nausea medication, call the clinic.   BELOW ARE SYMPTOMS THAT SHOULD BE REPORTED IMMEDIATELY:  *FEVER GREATER THAN 100.5 F  *CHILLS WITH OR WITHOUT FEVER  NAUSEA AND VOMITING THAT IS NOT CONTROLLED WITH YOUR NAUSEA MEDICATION  *UNUSUAL SHORTNESS OF BREATH  *UNUSUAL BRUISING OR BLEEDING  TENDERNESS IN MOUTH AND THROAT WITH OR WITHOUT PRESENCE OF ULCERS  *URINARY PROBLEMS  *BOWEL PROBLEMS  UNUSUAL RASH Items with * indicate a potential emergency and should be followed up as soon as possible.  Feel free to call the clinic you have any questions or concerns. The clinic phone number is [phone].  Please show the [facility] at check-in to the Emergency Department and triage nurse.  Lorazepam injection What is this medicine? LORAZEPAM (lor A ze pam) is a benzodiazepine. It is used to treat anxiety and certain types of seizures. It is also used to cause sleep before surgery and to block the memory of the procedure. This medicine may be used for other purposes; ask your health care provider or pharmacist if you have questions. COMMON BRAND NAME(S): Ativan What should I tell my health care provider before I take this medicine? They need to know if you have any of these conditions: -glaucoma -history of drug or alcohol abuse problem -kidney disease -liver disease -lung or breathing disease, like asthma -mental illness -myasthenia gravis -Parkinson's disease -suicidal thoughts, plans, or attempt; a previous suicide attempt by you or a family member -an unusual or allergic reaction to lorazepam, other medicines, foods,  dyes, or preservatives -pregnant or trying to get pregnant -breast-feeding How should I use this medicine? This medicine is for injection into a muscle or into a vein. It is given by a health care professional in a hospital or clinic setting. Talk to your pediatrician regarding the use of this medicine in children. Special care may be needed. Overdosage: If you think you have taken too much of this medicine contact a poison control center or emergency room at once. NOTE: This medicine is only for you. Do not share this medicine with others. What if I miss a dose? This does not apply. What may interact with this medicine? Do not take this medicine with any of the following medications: -narcotic medicines for cough -sodium oxybate This medicine may also interact with the following medications: -alcohol -antihistamines for allergy, cough and cold -certain medicines for anxiety or sleep -certain medicines for depression, like amitriptyline, fluoxetine, sertraline -certain medicines for seizures like carbamazepine, phenobarbital, phenytoin, primidone -general anesthetics like lidocaine, pramoxine, tetracaine -MAOIs like Carbex, Eldepryl, Marplan, Nardil, and Parnate -medicines that relax muscles for surgery -narcotic medicines for pain -phenothiazines like chlorpromazine, mesoridazine, prochlorperazine, thioridazine This list may not describe all possible interactions. Give your health care provider a list of all the medicines, herbs, non-prescription drugs, or dietary supplements you use. Also tell them if you smoke, drink alcohol, or use illegal drugs. Some items may interact with your medicine. What should I watch for while using this medicine? Tell your doctor or health care professional if your symptoms do not start to get better or if they get worse. You may get drowsy or dizzy. Do not  drive, use machinery, or do anything that needs mental alertness until you know how this medicine  affects you. Do not stand or sit up quickly, especially if you are an older patient. This reduces the risk of dizzy or fainting spells. Alcohol may interfere with the effect of this medicine. Avoid alcoholic drinks. If you are taking another medicine that also causes drowsiness, you may have more side effects. Give your health care provider a list of all medicines you use. Your doctor will tell you how much medicine to take. Do not take more medicine than directed. Call emergency for help if you have problems breathing or unusual sleepiness. What side effects may I notice from receiving this medicine? Side effects that you should report to your doctor or health care professional as soon as possible: -allergic reactions like skin rash, itching or hives, swelling of the face, lips, or tongue -breathing problems -confusion -loss of balance or coordination -signs and symptoms of low blood pressure like dizziness; feeling faint or lightheaded, falls; unusually weak or tired -suicidal thoughts or other mood changes Side effects that usually do not require medical attention (report to your doctor or health care professional if they continue or are bothersome): -dizziness -headache -nausea, vomiting -pain, redness, or irritation at the site where injected -tiredness This list may not describe all possible side effects. Call your doctor for medical advice about side effects. You may report side effects to FDA at 1-800-FDA-1088. Where should I keep my medicine? This medication will be given to you in a hospital or health clinic setting. You will not be given this medicine to take home. NOTE: This sheet is a summary. It may not cover all possible information. If you have questions about this medicine, talk to your doctor, pharmacist, or health care provider.  2017 Elsevier/Gold Standard (2015-06-23 14:45:14)  Albuterol inhalation aerosol What is this medicine? ALBUTEROL (al Normajean [place]) is a  bronchodilator. It helps open up the airways in your lungs to make it easier to breathe. This medicine is used to treat and to prevent bronchospasm. This medicine may be used for other purposes; ask your health care provider or pharmacist if you have questions. COMMON BRAND NAME(S): Proair HFA, Proventil, Proventil HFA, Respirol, Ventolin, Ventolin HFA What should I tell my health care provider before I take this medicine? They need to know if you have any of the following conditions: -diabetes -heart disease or irregular heartbeat -high blood pressure -pheochromocytoma -seizures -thyroid disease -an unusual or allergic reaction to albuterol, levalbuterol, sulfites, other medicines, foods, dyes, or preservatives -pregnant or trying to get pregnant -breast-feeding How should I use this medicine? This medicine is for inhalation through the mouth. Follow the directions on your prescription label. Take your medicine at regular intervals. Do not use more often than directed. Make sure that you are using your inhaler correctly. Ask you doctor or health care provider if you have any questions. Talk to your pediatrician regarding the use of this medicine in children. Special care may be needed. Overdosage: If you think you have taken too much of this medicine contact a poison control center or emergency room at once. NOTE: This medicine is only for you. Do not share this medicine with others. What if I miss a dose? If you miss a dose, use it as soon as you can. If it is almost time for your next dose, use only that dose. Do not use double or extra doses. What may interact with this medicine? -anti-infectives  like chloroquine and pentamidine -caffeine -cisapride -diuretics -medicines for colds -medicines for depression or for emotional or psychotic conditions -medicines for weight loss including some herbal products -methadone -some antibiotics like clarithromycin, erythromycin, levofloxacin,  and linezolid -some heart medicines -steroid hormones like dexamethasone, cortisone, hydrocortisone -theophylline -thyroid hormones This list may not describe all possible interactions. Give your health care provider a list of all the medicines, herbs, non-prescription drugs, or dietary supplements you use. Also tell them if you smoke, drink alcohol, or use illegal drugs. Some items may interact with your medicine. What should I watch for while using this medicine? Tell your doctor or health care professional if your symptoms do not improve. Do not use extra albuterol. If your asthma or bronchitis gets worse while you are using this medicine, call your doctor right away. If your mouth gets dry try chewing sugarless gum or sucking hard candy. Drink water as directed. What side effects may I notice from receiving this medicine? Side effects that you should report to your doctor or health care professional as soon as possible: -allergic reactions like skin rash, itching or hives, swelling of the face, lips, or tongue -breathing problems -chest pain -feeling faint or lightheaded, falls -high blood pressure -irregular heartbeat -fever -muscle cramps or weakness -pain, tingling, numbness in the hands or feet -vomiting Side effects that usually do not require medical attention (report to your doctor or health care professional if they continue or are bothersome): -cough -difficulty sleeping -headache -nervousness or trembling -stomach upset -stuffy or runny nose -throat irritation -unusual taste This list may not describe all possible side effects. Call your doctor for medical advice about side effects. You may report side effects to FDA at 1-800-FDA-1088. Where should I keep my medicine? Keep out of the reach of children. Store at room temperature between 15 and 30 degrees C (59 and 86 degrees F). The contents are under pressure and may burst when exposed to heat or flame. Do not freeze.  This medicine does not work as well if it is too cold. Throw away any unused medicine after the expiration date. Inhalers need to be thrown away after the labeled number of puffs have been used or by the expiration date; whichever comes first. Ventolin HFA should be thrown away 12 months after removing from foil pouch. Check the instructions that come with your medicine. NOTE: This sheet is a summary. It may not cover all possible information. If you have questions about this medicine, talk to your doctor, pharmacist, or health care provider.  2017 Elsevier/Gold Standard (2013-03-12 10:57:17)

## 2016-09-20 NOTE — Progress Notes (Signed)
[name], MD as PCP - General (Family Medicine) [name], MD as Consulting Physician (Cardiology)  DIAGNOSIS:  Encounter Diagnosis  Name Primary?  . Malignant neoplasm of upper-inner quadrant of right breast in female, estrogen receptor positive ([place])     SUMMARY OF ONCOLOGIC HISTORY:   Breast cancer of upper-inner quadrant of right female breast ([place])   06/15/2016 Mammogram    Right breast irregular mass UIQ with diffuse skin thickening that is palpable, additional pleomorphic calcifications 2.8 x 2.1 x 3.5 cm extending posteriorly for another 2 cm; left breast 8 mm circumscribed nodule       06/21/2016 Initial Diagnosis    Right breast biopsy UIQ: IDC grade 2 with papillary features ER 100%, PR 100%, Ki-67 15%, HER-2 negative ratio 1.21; right axillary LN biopsy metastatic carcinoma in one lymph node with ECE, ER 45%, PR 0%, Ki-67 20%, HER-2 negative ratio 1.86      06/28/2016 Procedure    Left breast biopsy: Fibroadenoma      07/16/2016 PET scan    Right breast primary inflammatory breast cancer, right axillary lymph node metastases, T7 bone metastases, indeterminate focus in the left medial breast 7 mm, right adrenal nodule      07/19/2016 -  Neo-Adjuvant Chemotherapy    Neoadjuvant Taxotere and Cytoxan every 3 weeks 4 cycles       CHIEF COMPLIANT: Cycle 4 Taxotere and Cytoxan  INTERVAL HISTORY: [name] is a 65 year old currently on neoadjuvant chemotherapy with Taxotere and Cytoxan. Today is cycle 4 of treatment. After cycloplegia profound fatigue and nausea and vomiting. The fatigue does not appear to improve. She nausea vomiting have resolved. She is really not felt well since her last treatment. She denied any fevers or chills.  REVIEW OF SYSTEMS:   Constitutional: Denies fevers, chills or abnormal weight loss Eyes: Denies blurriness of vision Ears, nose, mouth, throat, and face: Denies mucositis or sore  throat Respiratory: Denies cough, dyspnea or wheezes Cardiovascular: Denies palpitation, chest discomfort Gastrointestinal:  Denies nausea, heartburn or change in bowel habits Skin: Denies abnormal skin rashes Lymphatics: Denies new lymphadenopathy or easy bruising Neurological:Denies numbness, tingling or new weaknesses Behavioral/Psych: Mood is stable, no new changes  Extremities: No lower extremity edema Breast:  denies any pain or lumps or nodules in either breasts All other systems were reviewed with the patient and are negative.  I have reviewed the past medical history, past surgical history, social history and family history with the patient and they are unchanged from previous note.  ALLERGIES:  is allergic to lisinopril and eggs or egg-derived products.  MEDICATIONS:  Current Outpatient Prescriptions  Medication Sig Dispense Refill  . cholecalciferol (VITAMIN D) 1000 UNITS tablet Take 1,000 Units by mouth daily.    Marland Kitchen gabapentin (NEURONTIN) 300 MG capsule TAKE 1 CAPSULE (300 MG TOTAL) BY MOUTH 3 (THREE) TIMES DAILY. 90 capsule 5  . hydrochlorothiazide (HYDRODIURIL) 12.5 MG tablet Take 1 tablet (12.5 mg total) by mouth daily. 90 tablet 1  . latanoprost (XALATAN) 0.005 % ophthalmic solution Place 1 drop into both eyes at bedtime.     . lidocaine-prilocaine (EMLA) cream Apply to affected area once (Patient taking differently: Apply 1 application topically daily as needed (prior to port access). Apply to affected area once) 30 g 3  . losartan (COZAAR) 100 MG tablet Take 1 tablet (100 mg total) by mouth daily. 30 tablet 5  . ondansetron (ZOFRAN) 8 MG tablet Take 1 tablet (8 mg total) by  mouth 2 (two) times daily as needed for refractory nausea / vomiting. Start on day 3 after chemo. 30 tablet 1  . oxyCODONE (OXY IR/ROXICODONE) 5 MG immediate release tablet Take 1-2 tablets (5-10 mg total) by mouth every 4 (four) hours as needed for moderate pain. 40 tablet 0  . oxyCODONE-acetaminophen  (PERCOCET) 10-325 MG tablet Take 0.5-1 tablets by mouth every 6 (six) hours as needed for pain. 20 tablet 0  . pantoprazole (PROTONIX) 40 MG tablet Take 1 tablet (40 mg total) by mouth daily as needed. Caution:prolonged use may increase risk of pneumonia, colitis, osteoporosis, anemia (Patient taking differently: Take 40 mg by mouth daily as needed (for acid reflux/gerd). Caution:prolonged use may increase risk of pneumonia, colitis, osteoporosis, anemia) 90 tablet 0  . potassium chloride (KLOR-CON M10) 10 MEQ tablet Take 1 tablet (10 mEq total) by mouth 3 (three) times daily. 270 tablet 1  . prochlorperazine (COMPAZINE) 10 MG tablet Take 1 tablet (10 mg total) by mouth every 6 (six) hours as needed for nausea or vomiting. 30 tablet 3  . promethazine (PHENERGAN) 12.5 MG tablet Take 12.5 mg by mouth every 6 (six) hours as needed for nausea or vomiting.    . tizanidine (ZANAFLEX) 2 MG capsule Take 2 capsules (4 mg total) by mouth 3 (three) times daily as needed for muscle spasms. 30 capsule 1  . warfarin (COUMADIN) 4 MG tablet Take 3 tablets (12 mg total) by mouth daily. Or as otherwise directed by your doctor 90 tablet 3   No current facility-administered medications for this visit.     PHYSICAL EXAMINATION: ECOG PERFORMANCE STATUS: 1 - Symptomatic but completely ambulatory  Vitals:   09/20/16 1224  BP: (!) 159/77  Pulse: 84  Resp: 19  Temp: 97.4 F (36.3 C)   Filed Weights   09/20/16 1224  Weight: 245 lb 8 oz (111.4 kg)    GENERAL:alert, no distress and comfortable SKIN: skin color, texture, turgor are normal, no rashes or significant lesions EYES: normal, Conjunctiva are pink and non-injected, sclera clear OROPHARYNX:no exudate, no erythema and lips, buccal mucosa, and tongue normal  NECK: supple, thyroid normal size, non-tender, without nodularity LYMPH:  no palpable lymphadenopathy in the cervical, axillary or inguinal LUNGS: clear to auscultation and percussion with normal  breathing effort HEART: regular rate & rhythm and no murmurs and no lower extremity edema ABDOMEN:abdomen soft, non-tender and normal bowel sounds MUSCULOSKELETAL:no cyanosis of digits and no clubbing  NEURO: alert & oriented x 3 with fluent speech, no focal motor/sensory deficits, Generalized weakness EXTREMITIES: No lower extremity edema  LABORATORY DATA:  I have reviewed the data as listed   Chemistry      Component Value Date/Time   NA 141 08/31/2016 1059   K 3.6 08/31/2016 1059   CL 108 07/16/2016 1506   CO2 24 08/31/2016 1059   BUN 17.3 08/31/2016 1059   CREATININE 1.4 (H) 08/31/2016 1059   GLU 98 11/26/2014      Component Value Date/Time   CALCIUM 10.0 08/31/2016 1059   ALKPHOS 75 08/31/2016 1059   AST 28 08/31/2016 1059   ALT 20 08/31/2016 1059   BILITOT 0.45 08/31/2016 1059       Lab Results  Component Value Date   WBC 5.1 09/20/2016   HGB 8.5 (L) 09/20/2016   HCT 26.1 (L) 09/20/2016   MCV 89.1 09/20/2016   PLT 344 09/20/2016   NEUTROABS 3.3 09/20/2016    ASSESSMENT & PLAN:  Breast cancer of upper-inner quadrant of right  female breast ([place]) 06/15/2016: Right breast irregular mass UIQ with diffuse skin thickening that is palpable, additional pleomorphic calcifications 2.8 x 2.1 x 3.5 cm extending posteriorly for another 2 cm; left breast 8 mm circumscribed nodule  06/21/2016: Right breast biopsy UIQ: IDC grade 2 with papillary features ER 100%, PR 100%, Ki-67 15%, HER-2 negative ratio 1.21; right axillary LN biopsy metastatic carcinoma in one lymph node with ECE, ER 45%, PR 0%, Ki-67 20%, HER-2 negative ratio 1.86  07/16/2016: PET/CT scan: Right breast inflammatory breast cancer, T7 osseous metastases, indeterminate focus medial left breast corresponding to 7 mm calcified soft tissue nodule, right adrenal adenoma  Clinical stage: T4 N1M1stage IV  Treatment plan: 1. 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 followed by mastectomy with axillary node dissection (Patient wants to undergo bilateral mastectomies) 3. Followed by adjuvant radiation including radiation to T7 vertebral body 4. Followed by antiestrogen therapy with anastrozole 10 years ---------------------------------------------------------------------------------------------------------------------------------------------------------------- Radiology review: I discussed with the patient the result of the PET CT scan showing hypermetabolic is involving the T7 vertebra consistent with metastatic disease with an SUV of 14. That is the only site of metastatic disease. Because of this we elected to pursue with more aggressive systemic therapy approach with Taxotere and Cytoxan.  Current treatment: Cycle 4day 1Taxotere and Cytoxan Goal of treatment: Although cure is not possible for stage IV breast cancer, we are treating her with a curative intent to cause she only has a solitary site of metastatic disease.  Chemo toxicities:  1. Nausea and vomiting grade 2: I instructed her to stop the Zofran because it was causing headaches and use Compazine alone. I decreased the dosage of cycle 2 of chemotherapy. 2. severe fatigue and decreased oral intake: She is drinking more water her creatinine has improved to 1.1 today.. 3. Headaches: Improved by stopping Zofran 4. Body cramps and pains: I instructed her to take Claritin with the next cycle of chemotherapy. 5. Chemotherapy-induced anemia: Hemoglobin is 8.5 we are monitoring it closely. Patient feels extremely fatigued. I discussed with her about blood transfusion. She is not interested in this at this time.  monitoring closely for chemotherapy related toxicities.  I sent a message to Dr. Ninfa Linden to see her after the mammogram and ultrasound that has been scheduled for 09/26/2016. I discussed with her that it could take up to a month after chemotherapy is done  before she gets surgery. Patient wishes for bilateral mastectomies. Return to clinic one week after surgery.   No orders of the defined types were placed in this encounter.  The patient has a good understanding of the overall plan. she agrees with it. she will call with any problems that may develop before the next visit here.   Rulon Eisenmenger, MD 09/20/16
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
## 2016-09-20 NOTE — Assessment & Plan Note (Signed)
Patient presented to the [facility] today to receive cycle 4 of her Taxotere/Cytoxan chemotherapy regimen.  She is scheduled to have her mammogram and ultrasound on 09/26/2016.  Following this-patient is scheduled to have surgery with Dr. Rush Farmer.  She was instructed to return to the [facility] for follow-up with Dr. Lindi Adie approximately one month after her surgery.

## 2016-09-20 NOTE — Progress Notes (Signed)
At 1500, pt experienced sudden nausea and small amount of tan emesis. Taxotere paused and saline started. Called Selena Lesser, NP who ordered 0.5mg  ativan. 0.5mg  ativan given at 1520. Gave pt ten minutes and nausea lessened. Okay to restart taxotere 1535. 1605 taxotere stopped and noticed difference in pt breathing. [name], RN asked to bedside to auscultate and assist in assessment. Selena Lesser, NP notified of potential wheezing and shortly at bedside. BP 147/82, HR 79, and SpO2 98%. Albuterol ordered and given at 1617. Pt verbalized that she has to suction at home. Selena Lesser, NP commented that secretions heard in throat, but no wheezes in lung lobes. Most likely pt drowsy from ativan, and not clearing secretions as frequently. Selena Lesser, NP okay to continue with cytoxan. Pt drowsy, but stable upon d/c. Family member at bedside who will drive pt home.

## 2016-09-20 NOTE — Assessment & Plan Note (Signed)
Patient continues with chronic trach.  Patient had one episode of vomiting during her chemotherapy today; was given Ativan 0.5 mg IV.  The Ativan managed with the nausea well; but this did make patient more sleepy.  The nurse noticed that the patient had some questionable wheezing, and this provider was called to the bedside.  On further exam.  It appears that patient has some upper airway wheezing./Congestion; but  clear breath sounds bilaterally.  Patient was gently awakened and asked to cough and clear her throat.  Most of the wheezing./Secretions were cleared.  At that time.  However, did order 1.  Albuterol nebulizer treatment for the patient wishes at the cancer center.  Patient was also set up further in the bed as well.  Patient states that she typically has to frequently suction herself several times a day to clear the secretions.  Was noted that patient has no obvious acute respiratory distress whatsoever; see patient was allowed to complete/finish her chemotherapy today.

## 2016-09-21 ENCOUNTER — Other Ambulatory Visit: Payer: Self-pay | Admitting: Oncology

## 2016-09-21 ENCOUNTER — Ambulatory Visit: Payer: PPO

## 2016-09-21 ENCOUNTER — Telehealth: Payer: Self-pay | Admitting: *Deleted

## 2016-09-21 ENCOUNTER — Telehealth: Payer: Self-pay

## 2016-09-21 ENCOUNTER — Other Ambulatory Visit: Payer: Self-pay

## 2016-09-21 ENCOUNTER — Other Ambulatory Visit: Payer: PPO

## 2016-09-21 ENCOUNTER — Ambulatory Visit: Payer: PPO | Admitting: Hematology and Oncology

## 2016-09-21 DIAGNOSIS — C50211 Malignant neoplasm of upper-inner quadrant of right female breast: Secondary | ICD-10-CM

## 2016-09-21 NOTE — Progress Notes (Signed)
Pt called back and states that she just spoke with [facility] director and would like to speak to the nurse that called to inquire about getting transfusion for the pt. Pt attempted to do a 3 way call with the director but was unsuccessful. Pt gave nurse the number to call the director to speak to him about what happened. Pt wants to get a call back about having her transfusions done at [facility] instead of sickle cell next week, except 12/20.   Spoke with Pharmacologist, [place] over the phone and explained to him that I had spoken to the triage nurse regarding pt needing transfused but is unable to travel all the way to [facility] due to transportation resource and cost. Was told that the cancer center at [place] is unable to have pt come to get transfused because she is not an established pt. Triage nurse, Hassan Rowan, did offer to transfer me to [facility] hospital same day surgery to see if they can get her in this weekend or Monday morning. Was not able to get a hold of anyone but did leave a vm for their coordinator with call back number provided. Mr. Nelda Marseille explained why they cannot bring pt to be transfused at this time, and that they have a fully booked schedule next week. [facility] does not have hours of operation on weekends. If pt wants her care at [facility], [place] may need to speak with their providers to discuss what services that pt will need to get her established at their cancer center. Appreciative of the call and will notify Dr.Gudena of conversation.  Called pt back to update her that we will try to get her established at the [facility] and hopefully get her blood transfusion done at their center next week. Pt appreciative and anxiously waiting to hear back with appt.

## 2016-09-21 NOTE — Progress Notes (Signed)
Called pt to inform her that we have not heard from [facility] about getting her transfusions there, and an appointment with sickle cell unit was set up for 11am Monday morning. Will also set up lab at 10am for pt to get a t&s for blood. Pt states that she is unsure if she will be able to get a ride. Pt states that she will call first thing in the morning Monday to confirm her transfusion appt. Pt is feeling very upset and dissapointed that she is unable to go to the [facility] center and get her blood transfusion there. Pt is appreciative of trying to set up her appt and will work on calling transportation help.

## 2016-09-21 NOTE — Telephone Encounter (Signed)
error 

## 2016-09-21 NOTE — Telephone Encounter (Signed)
Pt called to relate she would like to have blood transfusion per Dr. Geralyn Flash recommendation. She would like to have it in [place] if possible d/t it is closer to her home. Informed pt that I would take her request to Dr. Geralyn Flash nurse to start the process. Received verbal understanding.

## 2016-09-21 NOTE — Telephone Encounter (Signed)
Called pt to follow up on her symptoms from chemo yesterday. Pt states that her nausea is a little bit more controlled, but still has mild shortness of breath. Pt also voiced that she would like to receive blood transfusion as soon as she can. She had discussed this with Dr.Gudena from last visit and had [facility] at [facility]. Pt expressed issue with transportation cost. Pt prefers to have her transfusion in [facility] instead, if she is able to. Called [facility] and states that they are unable to give pt her transfusion there because she is not a current established pt at their cancer center. Triage nurse from [facility] forwarded call to short stay in [facility]. Left msg and call back number to see if pt can have transfusion done there this weekend. Pt aware and will wait for response. Will reach out to [place] to see if pt can be assisted with transportation cost. Pt states that she can come to the cancer center next week and get her transfusion if they don't have anything at [facility]. Told pt that will update her back and call her to see where she will receive her transfusion for either tomorrow or Monday.

## 2016-09-24 ENCOUNTER — Other Ambulatory Visit: Payer: PPO

## 2016-09-24 ENCOUNTER — Other Ambulatory Visit: Payer: Self-pay | Admitting: Emergency Medicine

## 2016-09-24 ENCOUNTER — Inpatient Hospital Stay (HOSPITAL_COMMUNITY): Admission: RE | Admit: 2016-09-24 | Payer: PPO | Source: Ambulatory Visit

## 2016-09-24 ENCOUNTER — Encounter: Payer: Self-pay | Admitting: Family Medicine

## 2016-09-24 DIAGNOSIS — C50919 Malignant neoplasm of unspecified site of unspecified female breast: Secondary | ICD-10-CM

## 2016-09-25 ENCOUNTER — Inpatient Hospital Stay: Payer: PPO | Attending: Oncology

## 2016-09-25 ENCOUNTER — Telehealth: Payer: Self-pay | Admitting: *Deleted

## 2016-09-25 ENCOUNTER — Other Ambulatory Visit: Payer: Self-pay | Admitting: Internal Medicine

## 2016-09-25 ENCOUNTER — Emergency Department: Payer: PPO

## 2016-09-25 ENCOUNTER — Encounter: Payer: Self-pay | Admitting: Emergency Medicine

## 2016-09-25 ENCOUNTER — Other Ambulatory Visit: Payer: Self-pay

## 2016-09-25 ENCOUNTER — Telehealth: Payer: Self-pay | Admitting: Hematology and Oncology

## 2016-09-25 ENCOUNTER — Observation Stay
Admission: EM | Admit: 2016-09-25 | Discharge: 2016-09-26 | Disposition: A | Discharge status: Home / Self Care | Payer: PPO | Attending: Internal Medicine | Admitting: Internal Medicine

## 2016-09-25 ENCOUNTER — Encounter: Payer: Self-pay | Admitting: Family Medicine

## 2016-09-25 DIAGNOSIS — D649 Anemia, unspecified: Secondary | ICD-10-CM | POA: Diagnosis not present

## 2016-09-25 DIAGNOSIS — R0609 Other forms of dyspnea: Secondary | ICD-10-CM

## 2016-09-25 DIAGNOSIS — F17210 Nicotine dependence, cigarettes, uncomplicated: Secondary | ICD-10-CM | POA: Diagnosis not present

## 2016-09-25 DIAGNOSIS — R0602 Shortness of breath: Secondary | ICD-10-CM | POA: Diagnosis not present

## 2016-09-25 DIAGNOSIS — N182 Chronic kidney disease, stage 2 (mild): Secondary | ICD-10-CM | POA: Diagnosis not present

## 2016-09-25 DIAGNOSIS — Z7901 Long term (current) use of anticoagulants: Secondary | ICD-10-CM | POA: Diagnosis not present

## 2016-09-25 DIAGNOSIS — N183 Chronic kidney disease, stage 3 (moderate): Secondary | ICD-10-CM | POA: Insufficient documentation

## 2016-09-25 DIAGNOSIS — Z930 Tracheostomy status: Secondary | ICD-10-CM | POA: Diagnosis not present

## 2016-09-25 DIAGNOSIS — T451X5A Adverse effect of antineoplastic and immunosuppressive drugs, initial encounter: Principal | ICD-10-CM

## 2016-09-25 DIAGNOSIS — J0410 Acute tracheitis without obstruction: Secondary | ICD-10-CM | POA: Diagnosis not present

## 2016-09-25 DIAGNOSIS — G8921 Chronic pain due to trauma: Secondary | ICD-10-CM | POA: Diagnosis not present

## 2016-09-25 DIAGNOSIS — I252 Old myocardial infarction: Secondary | ICD-10-CM | POA: Insufficient documentation

## 2016-09-25 DIAGNOSIS — I2510 Atherosclerotic heart disease of native coronary artery without angina pectoris: Secondary | ICD-10-CM | POA: Diagnosis not present

## 2016-09-25 DIAGNOSIS — E876 Hypokalemia: Secondary | ICD-10-CM | POA: Insufficient documentation

## 2016-09-25 DIAGNOSIS — I509 Heart failure, unspecified: Secondary | ICD-10-CM | POA: Diagnosis not present

## 2016-09-25 DIAGNOSIS — C50211 Malignant neoplasm of upper-inner quadrant of right female breast: Secondary | ICD-10-CM

## 2016-09-25 DIAGNOSIS — Z950 Presence of cardiac pacemaker: Secondary | ICD-10-CM | POA: Insufficient documentation

## 2016-09-25 DIAGNOSIS — C773 Secondary and unspecified malignant neoplasm of axilla and upper limb lymph nodes: Secondary | ICD-10-CM | POA: Insufficient documentation

## 2016-09-25 DIAGNOSIS — K210 Gastro-esophageal reflux disease with esophagitis: Secondary | ICD-10-CM | POA: Diagnosis not present

## 2016-09-25 DIAGNOSIS — I482 Chronic atrial fibrillation: Secondary | ICD-10-CM | POA: Diagnosis not present

## 2016-09-25 DIAGNOSIS — Z79899 Other long term (current) drug therapy: Secondary | ICD-10-CM | POA: Insufficient documentation

## 2016-09-25 DIAGNOSIS — H409 Unspecified glaucoma: Secondary | ICD-10-CM | POA: Insufficient documentation

## 2016-09-25 DIAGNOSIS — Z8673 Personal history of transient ischemic attack (TIA), and cerebral infarction without residual deficits: Secondary | ICD-10-CM | POA: Diagnosis not present

## 2016-09-25 DIAGNOSIS — I110 Hypertensive heart disease with heart failure: Secondary | ICD-10-CM | POA: Diagnosis not present

## 2016-09-25 DIAGNOSIS — D6481 Anemia due to antineoplastic chemotherapy: Secondary | ICD-10-CM | POA: Insufficient documentation

## 2016-09-25 DIAGNOSIS — C50919 Malignant neoplasm of unspecified site of unspecified female breast: Secondary | ICD-10-CM | POA: Diagnosis not present

## 2016-09-25 DIAGNOSIS — I130 Hypertensive heart and chronic kidney disease with heart failure and stage 1 through stage 4 chronic kidney disease, or unspecified chronic kidney disease: Secondary | ICD-10-CM | POA: Diagnosis not present

## 2016-09-25 LAB — COMPREHENSIVE METABOLIC PANEL
ALT: 18 U/L (ref 14–54)
AST: 28 U/L (ref 15–41)
Albumin: 3.2 g/dL — ABNORMAL LOW (ref 3.5–5.0)
Alkaline Phosphatase: 70 U/L (ref 38–126)
Anion gap: 8 (ref 5–15)
BUN: 22 mg/dL — AB (ref 6–20)
CHLORIDE: 103 mmol/L (ref 101–111)
CO2: 24 mmol/L (ref 22–32)
CREATININE: 1.42 mg/dL — AB (ref 0.44–1.00)
Calcium: 9.1 mg/dL (ref 8.9–10.3)
GFR calc Af Amer: 45 mL/min — ABNORMAL LOW (ref >60)
GFR calc non Af Amer: 38 mL/min — ABNORMAL LOW (ref >60)
GLUCOSE: 97 mg/dL (ref 65–99)
Potassium: 3.6 mmol/L (ref 3.5–5.1)
SODIUM: 135 mmol/L (ref 135–145)
Total Bilirubin: 0.6 mg/dL (ref 0.3–1.2)
Total Protein: 7.2 g/dL (ref 6.5–8.1)

## 2016-09-25 LAB — CBC WITH DIFFERENTIAL/PLATELET
BASOS PCT: 1 %
Basophils Absolute: 0.1 10*3/uL (ref 0–0.1)
EOS PCT: 1 %
Eosinophils Absolute: 0.1 10*3/uL (ref 0–0.7)
HCT: 25.7 % — ABNORMAL LOW (ref 35.0–47.0)
Hemoglobin: 8.6 g/dL — ABNORMAL LOW (ref 12.0–16.0)
LYMPHS ABS: 0.8 10*3/uL — AB (ref 1.0–3.6)
Lymphocytes Relative: 8 %
MCH: 29.1 pg (ref 26.0–34.0)
MCHC: 33.4 g/dL (ref 32.0–36.0)
MCV: 87 fL (ref 80.0–100.0)
MONO ABS: 0.4 10*3/uL (ref 0.2–0.9)
Monocytes Relative: 4 %
Neutro Abs: 8.2 10*3/uL — ABNORMAL HIGH (ref 1.4–6.5)
Neutrophils Relative %: 86 %
PLATELETS: 244 10*3/uL (ref 150–440)
RBC: 2.95 MIL/uL — ABNORMAL LOW (ref 3.80–5.20)
RDW: 16.9 % — ABNORMAL HIGH (ref 11.5–14.5)
WBC: 9.6 10*3/uL (ref 3.6–11.0)

## 2016-09-25 LAB — ABO/RH: ABO/RH(D): AB POS

## 2016-09-25 LAB — TROPONIN I

## 2016-09-25 LAB — PROTIME-INR
INR: 2.93
Prothrombin Time: 31.2 seconds — ABNORMAL HIGH (ref 11.4–15.2)

## 2016-09-25 LAB — PREPARE RBC (CROSSMATCH)

## 2016-09-25 LAB — BRAIN NATRIURETIC PEPTIDE: B Natriuretic Peptide: 174 pg/mL — ABNORMAL HIGH (ref 0.0–100.0)

## 2016-09-25 MED ORDER — ONDANSETRON HCL 4 MG PO TABS: 4.0000 mg | ORAL_TABLET | Freq: Four times a day (QID) | ORAL | Status: DC | PRN

## 2016-09-25 MED ORDER — HYDROCHLOROTHIAZIDE 25 MG PO TABS
12.5000 mg | ORAL_TABLET | Freq: Every day | ORAL | Status: DC
  Administered 2016-09-26: 12.5 mg via ORAL
  Filled 2016-09-25: qty 1

## 2016-09-25 MED ORDER — IPRATROPIUM-ALBUTEROL 0.5-2.5 (3) MG/3ML IN SOLN
3.0000 mL | Freq: Once | RESPIRATORY_TRACT | Status: AC
  Administered 2016-09-25: 3 mL via RESPIRATORY_TRACT
  Filled 2016-09-25: qty 3

## 2016-09-25 MED ORDER — WARFARIN SODIUM 5 MG PO TABS: 10.0000 mg | ORAL_TABLET | ORAL | Status: DC

## 2016-09-25 MED ORDER — WARFARIN SODIUM 6 MG PO TABS: 12.0000 mg | ORAL_TABLET | ORAL | Status: DC

## 2016-09-25 MED ORDER — PENTAFLUOROPROP-TETRAFLUOROETH EX AERO
INHALATION_SPRAY | CUTANEOUS | Status: AC
  Filled 2016-09-25: qty 30

## 2016-09-25 MED ORDER — ACETAMINOPHEN 650 MG RE SUPP: 650.0000 mg | Freq: Four times a day (QID) | RECTAL | Status: DC | PRN

## 2016-09-25 MED ORDER — VITAMIN D 1000 UNITS PO TABS
1000.0000 [IU] | ORAL_TABLET | Freq: Every day | ORAL | Status: DC
  Administered 2016-09-26: 1000 [IU] via ORAL
  Filled 2016-09-25: qty 1

## 2016-09-25 MED ORDER — OXYCODONE HCL 5 MG PO TABS: 5.0000 mg | ORAL_TABLET | ORAL | Status: DC | PRN

## 2016-09-25 MED ORDER — GABAPENTIN 300 MG PO CAPS
300.0000 mg | ORAL_CAPSULE | Freq: Three times a day (TID) | ORAL | Status: DC
  Administered 2016-09-25 – 2016-09-26 (×2): 300 mg via ORAL
  Filled 2016-09-25 (×2): qty 1

## 2016-09-25 MED ORDER — WARFARIN SODIUM 5 MG PO TABS
10.0000 mg | ORAL_TABLET | Freq: Once | ORAL | Status: AC
  Administered 2016-09-25: 10 mg via ORAL
  Filled 2016-09-25: qty 2

## 2016-09-25 MED ORDER — WARFARIN - PHARMACIST DOSING INPATIENT: Freq: Every day | Status: DC

## 2016-09-25 MED ORDER — ONDANSETRON HCL 4 MG PO TABS: 8.0000 mg | ORAL_TABLET | Freq: Two times a day (BID) | ORAL | Status: DC | PRN

## 2016-09-25 MED ORDER — WARFARIN SODIUM 6 MG PO TABS: 12.0000 mg | ORAL_TABLET | Freq: Every day | ORAL | Status: DC

## 2016-09-25 MED ORDER — ACETAMINOPHEN 325 MG PO TABS: 162.5000 mg | ORAL_TABLET | Freq: Four times a day (QID) | ORAL | Status: DC | PRN

## 2016-09-25 MED ORDER — DIPHENHYDRAMINE HCL 50 MG/ML IJ SOLN
50.0000 mg | Freq: Once | INTRAMUSCULAR | Status: AC
  Administered 2016-09-25: 50 mg via INTRAVENOUS
  Filled 2016-09-25: qty 1

## 2016-09-25 MED ORDER — PANTOPRAZOLE SODIUM 40 MG PO TBEC: 40.0000 mg | DELAYED_RELEASE_TABLET | Freq: Every day | ORAL | Status: DC | PRN

## 2016-09-25 MED ORDER — OXYCODONE HCL 5 MG PO TABS: 5.0000 mg | ORAL_TABLET | Freq: Four times a day (QID) | ORAL | Status: DC | PRN

## 2016-09-25 MED ORDER — LOSARTAN POTASSIUM 50 MG PO TABS
100.0000 mg | ORAL_TABLET | Freq: Every day | ORAL | Status: DC
  Administered 2016-09-26: 10:00:00 100 mg via ORAL
  Filled 2016-09-25: qty 2

## 2016-09-25 MED ORDER — LATANOPROST 0.005 % OP SOLN
1.0000 [drp] | Freq: Every day | OPHTHALMIC | Status: DC
  Administered 2016-09-25: 1 [drp] via OPHTHALMIC
  Filled 2016-09-25: qty 2.5

## 2016-09-25 MED ORDER — ONDANSETRON HCL 4 MG/2ML IJ SOLN: 4.0000 mg | Freq: Four times a day (QID) | INTRAMUSCULAR | Status: DC | PRN

## 2016-09-25 MED ORDER — SODIUM CHLORIDE 0.9 % IV SOLN
10.0000 mL/h | Freq: Once | INTRAVENOUS | Status: AC
  Administered 2016-09-25: 23:00:00 10 mL/h via INTRAVENOUS

## 2016-09-25 MED ORDER — FUROSEMIDE 10 MG/ML IJ SOLN
20.0000 mg | Freq: Once | INTRAMUSCULAR | Status: AC
  Administered 2016-09-25: 20 mg via INTRAVENOUS
  Filled 2016-09-25: qty 4

## 2016-09-25 MED ORDER — METHYLPREDNISOLONE SODIUM SUCC 125 MG IJ SOLR
125.0000 mg | Freq: Once | INTRAMUSCULAR | Status: AC
  Administered 2016-09-25: 125 mg via INTRAVENOUS
  Filled 2016-09-25: qty 2

## 2016-09-25 MED ORDER — ACETAMINOPHEN 325 MG PO TABS: 650.0000 mg | ORAL_TABLET | Freq: Four times a day (QID) | ORAL | Status: DC | PRN

## 2016-09-25 MED ORDER — POTASSIUM CHLORIDE CRYS ER 10 MEQ PO TBCR
10.0000 meq | EXTENDED_RELEASE_TABLET | Freq: Three times a day (TID) | ORAL | Status: DC
  Administered 2016-09-25 – 2016-09-26 (×2): 10 meq via ORAL
  Filled 2016-09-25 (×2): qty 1

## 2016-09-25 MED ORDER — FAMOTIDINE IN NACL 20-0.9 MG/50ML-% IV SOLN
20.0000 mg | Freq: Once | INTRAVENOUS | Status: AC
  Administered 2016-09-25: 20 mg via INTRAVENOUS
  Filled 2016-09-25: qty 50

## 2016-09-25 MED ORDER — OXYCODONE-ACETAMINOPHEN 10-325 MG PO TABS: 0.5000 | ORAL_TABLET | Freq: Four times a day (QID) | ORAL | Status: DC | PRN
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
## 2016-09-25 NOTE — Progress Notes (Signed)
Patient suctioned  for small amount of thin white secretions.  Patient tolerated well HR 90 RR 18 saturations 98%.
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
## 2016-09-25 NOTE — Discharge Instructions (Signed)
I have discussed the your case in your lab work and your x-ray with our oncologist here and your oncologist at [facility]. Your oncologist wants her to call him up and try to follow-up with him tomorrow worsen as possible for further evaluation. At the present time you do not meet criteria for admission and we've reviewed your chart every possible way and according to the rules that we have to follow you do not meet criteria for admission at this point. If you get worse at all please return here or to see her oncologist at [facility] and we will reevaluate [facility] if we can but she was in the hospital at that time.

## 2016-09-25 NOTE — ED Notes (Addendum)
Patient's healthcare power of attorney: Gaspar Skeeters (please contact with any changes)  [phone]

## 2016-09-25 NOTE — ED Triage Notes (Addendum)
Arrives via [facility] with c/o SOB this morning.  Patient awoke this morning feeling SOB.  Patient has scheduled appointment at [place] today for blood transfusion.  Patient has a 4.0 cuffless shiley trach in place.

## 2016-09-25 NOTE — ED Notes (Signed)
Ambulated patient while monitoring her oxygen saturation.  Patient's oxygen level dropped briefly down to 92%.  Dr. Cinda Quest notified.
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
## 2016-09-25 NOTE — ED Notes (Signed)
Attempted to call report. Floor RN busy at this time.

## 2016-09-25 NOTE — ED Notes (Signed)
Patient given coffee and graham crackers per Dr. Cinda Quest.

## 2016-09-25 NOTE — ED Notes (Signed)
Patient appears to be sleeping at this time.

## 2016-09-25 NOTE — Telephone Encounter (Signed)
sw pt to inform her of 12/21 appt date/time. Pt stated she is currently at ER and may not make appt

## 2016-09-25 NOTE — Telephone Encounter (Signed)
Received notification pt in hospital at [facility] and need to cancel [place] and mammo on 12/20. Called GI and request cancel appts on 12/20 and to call pt on Thursday to r/s mammo/US.  Called pt friend [facility] manager- [facility]) and discussed mammo/US was cancelled and they will call her with a new appt.

## 2016-09-25 NOTE — Progress Notes (Signed)
Pt currently at [facility] ED. Pt called wanting to be admitted, but does not meet requirements for admission at this time. ED dr had detail discussion with Dr.Gudena and pt to receive 1 unit of prbc today and pt can f/u with Dr.Gudena on Thursday for additional transfusions if needed at the cancer center in [place]. Will call pt back to follow up tomorrow.

## 2016-09-26 ENCOUNTER — Inpatient Hospital Stay: Payer: PPO

## 2016-09-26 ENCOUNTER — Encounter: Payer: Self-pay | Admitting: *Deleted

## 2016-09-26 ENCOUNTER — Other Ambulatory Visit: Payer: PPO

## 2016-09-26 ENCOUNTER — Telehealth: Payer: Self-pay

## 2016-09-26 DIAGNOSIS — D649 Anemia, unspecified: Secondary | ICD-10-CM | POA: Diagnosis not present

## 2016-09-26 DIAGNOSIS — N182 Chronic kidney disease, stage 2 (mild): Secondary | ICD-10-CM | POA: Diagnosis not present

## 2016-09-26 DIAGNOSIS — C50919 Malignant neoplasm of unspecified site of unspecified female breast: Secondary | ICD-10-CM | POA: Diagnosis not present

## 2016-09-26 DIAGNOSIS — I482 Chronic atrial fibrillation: Secondary | ICD-10-CM | POA: Diagnosis not present

## 2016-09-26 LAB — CBC
HCT: 27.9 % — ABNORMAL LOW (ref 35.0–47.0)
Hemoglobin: 9.4 g/dL — ABNORMAL LOW (ref 12.0–16.0)
MCH: 29.4 pg (ref 26.0–34.0)
MCHC: 33.5 g/dL (ref 32.0–36.0)
MCV: 87.8 fL (ref 80.0–100.0)
PLATELETS: 251 10*3/uL (ref 150–440)
RBC: 3.18 MIL/uL — AB (ref 3.80–5.20)
RDW: 17.2 % — AB (ref 11.5–14.5)
WBC: 11.1 10*3/uL — AB (ref 3.6–11.0)

## 2016-09-26 LAB — TYPE AND SCREEN
ABO/RH(D): AB POS
ANTIBODY SCREEN: NEGATIVE
UNIT DIVISION: 0

## 2016-09-26 LAB — PROTIME-INR
INR: 2.9 — AB (ref 0.9–1.1)
INR: 2.97
PROTHROMBIN TIME: 31.5 s — AB (ref 11.4–15.2)

## 2016-09-26 MED ORDER — LEVOFLOXACIN 750 MG PO TABS: 750.0000 mg | ORAL_TABLET | Freq: Every day | ORAL | 0 refills | Status: DC

## 2016-09-26 NOTE — Progress Notes (Signed)
[employer] Work LATE ENTRY  Clinical Social Work continues to follow and mailed to pt's home additional resources to assist pt, such as [employer] and [employer]. Pt shared she had received these application, but she does not file taxes so is not eligible for [employer]. CSW reviewed Cancer [employer] application procedure as well. Pt can use her ss disability letter to apply for their assistance. CSW and pt discussed transportation options and main option is [facility] through [facility]. County/city specific transportation options do not cross county/city lines. Pt stated understanding.   Clinical Social Work interventions: Resource education and referral  Loren Racer, [place] Worker [place]  [place] Phone: [phone] Fax: [phone]
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
## 2016-09-26 NOTE — Care Management (Addendum)
Admitted to this facility with the diagnosis of anemia under observation status. Discharged from [facility] 07/19/16. Lives alone. Friend is Lenice Llamas [phone]). Home Health per [facility] many years ago. Takes care of all basic activities of daily living herself. Trach. Care per self. Taking bath at this time. Would like case manager to come back to finish assessment later. Shelbie Ammons RN MSN CCM Care Management

## 2016-09-26 NOTE — Progress Notes (Signed)
Discussed discharge instructions and medications with pt. IV removed. All questions addressed. Pt transported home via car by her friend.  Danielle Elton Heid, RN 

## 2016-09-26 NOTE — Telephone Encounter (Signed)
Pt called to update Dr.Gudena that she is home from the hospital. Pt is now on antibiotics for tracheitis. Pt states that she still feels very weak. Encouraged pt to hydrate and to call if symptoms gets worse or go to the emergency room. Pt verbalized understanding. Pt requested to call POA friend, Mariea Clonts to give updates since pt unable to really talk at this time. Called Mariea Clonts to go over rescheduling pt MM/US at breast center. Gave number to reschedule appt. Also mentioned that Dr.Gudena would like to recheck pt lab work in 1 week. We can arrange her lab appt, the same day as her MM/US due to transportation issues. Pt friend voiced understanding and will call breast center to reschedule pt appt today.

## 2016-09-26 NOTE — Care Management Obs Status (Signed)
[facility] NOTIFICATION   Patient Details  Name: Amel Hagens MRN: [id number] Date of Birth: June 09, 1951   Medicare Observation Status Notification Given:  Yes    Shelbie Ammons, RN 09/26/2016, 8:52 AM

## 2016-09-26 NOTE — Progress Notes (Signed)
Nutrition Brief Note  Patient identified on the Malnutrition Screening Tool (MST) Report  Wt Readings from Last 15 Encounters:  09/25/16 289 lb 3 oz (131.2 kg)  09/20/16 245 lb 8 oz (111.4 kg)  08/31/16 248 lb 3.2 oz (112.6 kg)  08/09/16 247 lb 9.6 oz (112.3 kg)  07/26/16 264 lb 6.4 oz (119.9 kg)  07/19/16 250 lb 12.8 oz (113.8 kg)  07/18/16 259 lb 14.8 oz (117.9 kg)  07/05/16 249 lb (112.9 kg)  07/05/16 249 lb (112.9 kg)  05/30/16 247 lb 3.2 oz (112.1 kg)  05/25/16 240 lb (108.9 kg)  08/11/15 254 lb 8 oz (115.4 kg)  07/11/15 251 lb 4.8 oz (114 kg)  12/22/14 252 lb 14.4 oz (114.7 kg)  01/19/14 251 lb (113.9 kg)   65 y.o. female with a known history of Breast cancer undergoing chemotherapy and radiation, chronic atrial fibrillation on Coumadin, chronic arthritis, chronic anemia and chronic kidney disease stage III comes to the emergency room with some shortness of breath and weakness. Patient also with tracheostomy collar.   Attempted to see patient due to MST. Patient was admitted late last evening and was preparing for discharge at time of assessment this morning. She reports she has lost about 25 lbs since she began treatment. Patient reports she has not seen a RD yet. Attempted to review High-Calorie, High-Protein education with patient to help her find foods to prevent weight loss. However patient reports she has never had eggs, milk, yogurt, cheese, cottage cheese, cereal or anything else on the handout. She reports "I did not eat these foods growing up and I do not eat them now." When asked about recall/typical intake patient only reports eating dried beans. Encouraged patient to try other protein-containing foods but she is refusing. Also refuses to drink Ensure/Boost.  Per chart current body weight likely inaccurate.  Body mass index is 45.29 kg/m. Patient meets criteria for Obesity Class III based on current BMI.   Current diet order is Regular, patient is consuming  approximately 100% of meals at this time. Labs and medications reviewed.   Willey Blade, MS, RD, LDN Pager: [phone] After Hours Pager: [phone]

## 2016-09-27 ENCOUNTER — Other Ambulatory Visit: Payer: PPO

## 2016-09-27 ENCOUNTER — Ambulatory Visit: Payer: PPO

## 2016-09-27 ENCOUNTER — Other Ambulatory Visit: Payer: Self-pay

## 2016-09-27 ENCOUNTER — Ambulatory Visit: Payer: PPO | Admitting: Hematology and Oncology

## 2016-09-27 NOTE — Progress Notes (Signed)
Pt called about follow up for lab work to check on pt hg level after transfusion. Pt request to call her POA Lanoire to discuss time for lab next Friday 12/29, same as US/MM schedule. Told Lanoire that pt is scheduled for lab at 11:45am 12/29. Pt does not need to wait for labs. Will call pt if labs are abnormal. Pt and POA verbalized understanding and has no further concerns at this time.

## 2016-10-03 DIAGNOSIS — R001 Bradycardia, unspecified: Secondary | ICD-10-CM | POA: Diagnosis not present

## 2016-10-05 ENCOUNTER — Telehealth: Payer: Self-pay | Admitting: *Deleted

## 2016-10-05 ENCOUNTER — Telehealth: Payer: Self-pay | Admitting: Family Medicine

## 2016-10-05 ENCOUNTER — Encounter: Payer: Self-pay | Admitting: Family Medicine

## 2016-10-05 ENCOUNTER — Ambulatory Visit
Admission: RE | Admit: 2016-10-05 | Discharge: 2016-10-05 | Disposition: A | Discharge status: Home / Self Care | Payer: PPO | Source: Ambulatory Visit | Attending: Hematology and Oncology | Admitting: Hematology and Oncology

## 2016-10-05 ENCOUNTER — Other Ambulatory Visit (HOSPITAL_BASED_OUTPATIENT_CLINIC_OR_DEPARTMENT_OTHER): Payer: PPO

## 2016-10-05 DIAGNOSIS — Z170 Estrogen receptor positive status [ER+]: Secondary | ICD-10-CM

## 2016-10-05 DIAGNOSIS — C50211 Malignant neoplasm of upper-inner quadrant of right female breast: Secondary | ICD-10-CM | POA: Diagnosis not present

## 2016-10-05 DIAGNOSIS — R928 Other abnormal and inconclusive findings on diagnostic imaging of breast: Secondary | ICD-10-CM | POA: Diagnosis not present

## 2016-10-05 DIAGNOSIS — Z7901 Long term (current) use of anticoagulants: Secondary | ICD-10-CM

## 2016-10-05 DIAGNOSIS — N6312 Unspecified lump in the right breast, upper inner quadrant: Secondary | ICD-10-CM | POA: Diagnosis not present

## 2016-10-05 LAB — COMPREHENSIVE METABOLIC PANEL
ALBUMIN: 3.1 g/dL — AB (ref 3.5–5.0)
ALK PHOS: 87 U/L (ref 40–150)
ALT: 13 U/L (ref 0–55)
ANION GAP: 10 meq/L (ref 3–11)
AST: 18 U/L (ref 5–34)
BILIRUBIN TOTAL: 0.3 mg/dL (ref 0.20–1.20)
BUN: 18.2 mg/dL (ref 7.0–26.0)
CALCIUM: 9.6 mg/dL (ref 8.4–10.4)
CO2: 24 mEq/L (ref 22–29)
CREATININE: 1.3 mg/dL — AB (ref 0.6–1.1)
Chloride: 107 mEq/L (ref 98–109)
EGFR: 52 mL/min/{1.73_m2} — AB (ref >90)
Glucose: 90 mg/dl (ref 70–140)
Potassium: 3.9 mEq/L (ref 3.5–5.1)
Sodium: 141 mEq/L (ref 136–145)
TOTAL PROTEIN: 7.2 g/dL (ref 6.4–8.3)

## 2016-10-05 LAB — CBC WITH DIFFERENTIAL/PLATELET
BASO%: 0.6 % (ref 0.0–2.0)
Basophils Absolute: 0 10*3/uL (ref 0.0–0.1)
EOS ABS: 0 10*3/uL (ref 0.0–0.5)
EOS%: 0.2 % (ref 0.0–7.0)
HEMATOCRIT: 31.5 % — AB (ref 34.8–46.6)
HEMOGLOBIN: 10.3 g/dL — AB (ref 11.6–15.9)
LYMPH#: 1.1 10*3/uL (ref 0.9–3.3)
LYMPH%: 13.7 % — AB (ref 14.0–49.7)
MCH: 29.3 pg (ref 25.1–34.0)
MCHC: 32.9 g/dL (ref 31.5–36.0)
MCV: 89.3 fL (ref 79.5–101.0)
MONO#: 0.8 10*3/uL (ref 0.1–0.9)
MONO%: 10.3 % (ref 0.0–14.0)
NEUT%: 75.2 % (ref 38.4–76.8)
NEUTROS ABS: 6.1 10*3/uL (ref 1.5–6.5)
PLATELETS: 206 10*3/uL (ref 145–400)
RBC: 3.53 10*6/uL — ABNORMAL LOW (ref 3.70–5.45)
RDW: 17.4 % — AB (ref 11.2–14.5)
WBC: 8.2 10*3/uL (ref 3.9–10.3)

## 2016-10-05 LAB — PROTIME-INR
INR: 4.1 — ABNORMAL HIGH (ref 2.00–3.50)
Protime: 49.2 Seconds — ABNORMAL HIGH (ref 10.6–13.4)

## 2016-10-05 NOTE — Telephone Encounter (Signed)
INR is 4.1 at Dr. Geralyn Flash office today They will send over copy; I told her I will take care of this Disregard other instructions typed earlier I called patient personally; left message;  Hold coumadin today and tomorrow and Sunday, restart on Monday at previous dose, recheck Tuesday No bleeding I spoke with patient personally; she is at breast center right now Okay to eat some cabbage

## 2016-10-05 NOTE — Telephone Encounter (Signed)
Please let patient know that her INR was 2.9 Trended up just a bit I think that might be from the levaquin she was put on, and that should be finished now and washing out of her system Continue same dose and recheck on Thursday Oct 11, 2016 Thank you

## 2016-10-05 NOTE — Telephone Encounter (Signed)
INR result of 4.1 received by this RN- per chart review noted coumadin is ordered by pt's primary MD- Dr Sanda Klein at [facility].  Note also entered by Dr Sanda Klein per dosing recommendations for coumadin.  This RN contacted Dr Delight Ovens office and was able to speak directly to the doctor. Lab result reviewed with Dr Sanda Klein requesting fax of reading and stating her office will contact pt with appropriate recommendations.  No other needs at this time.

## 2016-10-10 ENCOUNTER — Encounter: Payer: Self-pay | Admitting: Genetic Counselor

## 2016-10-11 DIAGNOSIS — I482 Chronic atrial fibrillation: Secondary | ICD-10-CM | POA: Diagnosis not present

## 2016-10-11 LAB — PROTIME-INR: INR: 1.8 — AB (ref 0.9–1.1)

## 2016-10-12 ENCOUNTER — Telehealth: Payer: Self-pay | Admitting: Family Medicine

## 2016-10-12 NOTE — Telephone Encounter (Signed)
INR is 1.8 It was high, 4.1, last week and coumadin was held She had also been on antibiotic Let's get her back on track and see if we can get things stable again ----------------------------- Please have patient resume coumadin as follows: 12 mg on five days per week, 10 mg two days per week (Wednesdays and Sundays) Recheck INR on Wednesday January 17th, call sooner with any problems

## 2016-10-12 NOTE — Telephone Encounter (Signed)
Called home number no one answered and called cell and left voicemail since it is Friday afternon.

## 2016-10-15 ENCOUNTER — Encounter: Payer: Self-pay | Admitting: Family Medicine

## 2016-10-16 ENCOUNTER — Other Ambulatory Visit: Payer: Self-pay | Admitting: Surgery

## 2016-10-16 DIAGNOSIS — C50911 Malignant neoplasm of unspecified site of right female breast: Secondary | ICD-10-CM | POA: Diagnosis not present

## 2016-10-19 ENCOUNTER — Telehealth: Payer: Self-pay

## 2016-10-19 NOTE — Telephone Encounter (Signed)
Received PT/INR results of 3.1.  Patient currently on 10mg  wed and sun and 12mg  all other days.  I consulted with Dr. Ancil Boozer she states continue current dose and recheck in 1 week.

## 2016-10-22 ENCOUNTER — Encounter: Payer: Self-pay | Admitting: Family Medicine

## 2016-10-22 LAB — PROTIME-INR: INR: 3.1 — AB (ref 0.9–1.1)

## 2016-10-26 ENCOUNTER — Telehealth: Payer: Self-pay | Admitting: Family Medicine

## 2016-10-26 NOTE — Telephone Encounter (Signed)
Tried calling home and cell # to schedule a surgical clearance for pt. Home # no answer and LMOM on cell for pt to call the office.

## 2016-10-26 NOTE — Telephone Encounter (Signed)
Called patient notified she will need to make an appt. With [place] and cardio for surgical clearance.

## 2016-10-27 LAB — PROTIME-INR: INR: 3.3 — AB (ref 0.9–1.1)

## 2016-10-30 ENCOUNTER — Telehealth: Payer: Self-pay | Admitting: Family Medicine

## 2016-10-30 NOTE — Telephone Encounter (Signed)
INR is 3.3 Decrease dose slightly Was on 10 mg on Wed and Sun and 12 mg all other days Decrease to new instructions: 10 mg on Tues, Thurs, Sat, Sun 12 mg on Mon, Wed, Fri Recheck INR in 2 weeks

## 2016-10-30 NOTE — Telephone Encounter (Signed)
Patient notified and informed of new instructions.

## 2016-10-31 ENCOUNTER — Encounter: Payer: Self-pay | Admitting: Family Medicine

## 2016-10-31 LAB — PROTIME-INR: INR: 2.2 — AB (ref 0.9–1.1)

## 2016-11-07 DIAGNOSIS — I482 Chronic atrial fibrillation: Secondary | ICD-10-CM | POA: Diagnosis not present

## 2016-11-07 LAB — PROTIME-INR: INR: 2.8 — AB (ref 0.9–1.1)

## 2016-11-09 ENCOUNTER — Encounter: Payer: Self-pay | Admitting: Family Medicine

## 2016-11-13 ENCOUNTER — Ambulatory Visit (INDEPENDENT_AMBULATORY_CARE_PROVIDER_SITE_OTHER): Payer: PPO | Admitting: Family Medicine

## 2016-11-13 ENCOUNTER — Encounter: Payer: Self-pay | Admitting: Family Medicine

## 2016-11-13 VITALS — BP 138/76 | HR 90 | Temp 97.8°F | Resp 16 | Wt 247.1 lb

## 2016-11-13 DIAGNOSIS — C50211 Malignant neoplasm of upper-inner quadrant of right female breast: Secondary | ICD-10-CM | POA: Diagnosis not present

## 2016-11-13 DIAGNOSIS — Z45010 Encounter for checking and testing of cardiac pacemaker pulse generator [battery]: Secondary | ICD-10-CM | POA: Diagnosis not present

## 2016-11-13 DIAGNOSIS — R0989 Other specified symptoms and signs involving the circulatory and respiratory systems: Secondary | ICD-10-CM

## 2016-11-13 DIAGNOSIS — Z23 Encounter for immunization: Secondary | ICD-10-CM

## 2016-11-13 DIAGNOSIS — I482 Chronic atrial fibrillation, unspecified: Secondary | ICD-10-CM

## 2016-11-13 DIAGNOSIS — Z5181 Encounter for therapeutic drug level monitoring: Secondary | ICD-10-CM

## 2016-11-13 DIAGNOSIS — Z7901 Long term (current) use of anticoagulants: Secondary | ICD-10-CM

## 2016-11-13 DIAGNOSIS — R0689 Other abnormalities of breathing: Secondary | ICD-10-CM

## 2016-11-13 DIAGNOSIS — I498 Other specified cardiac arrhythmias: Secondary | ICD-10-CM | POA: Diagnosis not present

## 2016-11-13 LAB — POCT INR: INR: 2.4

## 2016-11-13 MED ORDER — TRIAMCINOLONE ACETONIDE 0.1 % EX CREA: 1.0000 "application " | TOPICAL_CREAM | Freq: Two times a day (BID) | CUTANEOUS | 1 refills | Status: DC
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
## 2016-11-13 NOTE — Assessment & Plan Note (Signed)
Check PT/INR 

## 2016-11-13 NOTE — Patient Instructions (Addendum)
INR today is 2.4 You received the flu shot today; it should protect you against the flu virus over the coming months; it will take about two weeks for antibodies to develop; do try to stay away from hospitals, nursing homes, and daycares during peak flu season; taking extra vitamin C daily during flu season may help you avoid getting sick We'll have you bridge to Lovenox twice a day prior to your surgery; have your surgeon call me to see if he agrees with changing anticoagulation FIVE days prior and stopping Lovenox day before surgery; you can restart Lovenox and coumadin after your surgeon tells you it's okay to resume, and stop Lovenox when your INR gets up to 2.0 Please have a chest xray today or tomorrow

## 2016-11-13 NOTE — Assessment & Plan Note (Signed)
Order CXR

## 2016-11-15 ENCOUNTER — Telehealth: Payer: Self-pay | Admitting: *Deleted

## 2016-11-15 NOTE — Telephone Encounter (Signed)
Received msg from pt to return call. Left vm on cell, requesting return call. Also called house number twice, no answer and no answering machine picked up.

## 2016-11-16 ENCOUNTER — Telehealth: Payer: Self-pay | Admitting: *Deleted

## 2016-11-16 NOTE — Telephone Encounter (Signed)
Pt called asking if this RN had heard about a surgery date being scheduled for her. Relate she has met with her PCP and cardiologist for clearance. Informed pt that I could see the clearance from PCP, but was not able to see it from her cardiologist Dr. Rosita Fire. Informed pt that I would reach out to Dr. Trevor Mace nurse concerning cardiac clearance. Informed pt that spoke with Dr. Trevor Mace nurse and was informed [facility] has not yet received the written cardiac clearance and that she we going to call for it to be faxed over to her office.  Received verbal understanding. Denies further needs at this time.

## 2016-11-19 ENCOUNTER — Encounter: Payer: Self-pay | Admitting: Family Medicine

## 2016-11-19 LAB — PROTIME-INR: INR: 2.5 — AB (ref 0.9–1.1)

## 2016-11-20 ENCOUNTER — Encounter: Payer: Self-pay | Admitting: Family Medicine

## 2016-11-20 NOTE — Assessment & Plan Note (Signed)
On chronic anticoagulation; will want to transition her to Lovenox 1 mg/kg every 12 hours prior to surgery, stop coumadin and start Lovenox 5 days prior; restart Lovenox and coumadin after surgery as surgeon allows as soon as feasible

## 2016-11-20 NOTE — Assessment & Plan Note (Addendum)
Planning for mastectomy; I will approved medical clearance for her procedure once I see CXR to r/o any pneumonia or other issue that needs addressing prior to surgery; patient may go across the street for CXR today or tomorrow

## 2016-11-22 ENCOUNTER — Telehealth: Payer: Self-pay | Admitting: Family Medicine

## 2016-11-22 MED ORDER — ENOXAPARIN SODIUM 100 MG/ML ~~LOC~~ SOLN: 100.0000 mg | Freq: Two times a day (BID) | SUBCUTANEOUS | 0 refills | Status: DC

## 2016-11-22 NOTE — Telephone Encounter (Signed)
Patient called, and I spoke with her; she was very upset, yelling with a loud voice, saying I was the hold up and why did I want to run up a bill and make her get a CXR when her other doctors said her lungs were fine; I stated that I was doing my best to look out for her, heard decreased breath sounds at the bases, and that I wanted a CXR to evaluate prior to surgery She was adamant that she's not going to get the CXR and asked "what are we going to do?" I asked her to not yell; I try to treat her with respect and would ask her to do the same for me; I'm trying to look out for her best interests; I'll be glad to talk with Dr. Ninfa Linden She said she wanted someone to call her when I go through ------------------------ Staff, please get Dr. Ninfa Linden to the phone for me

## 2016-11-22 NOTE — Telephone Encounter (Signed)
I spoke with Dr. Ninfa Linden, who returned my call I explained that I had heard decreased breath sounds on the left, wanted a CXR, but patient does not want to get the CXR; I told him that was the only thing keeping me from clearing her He understands and sounds like he will turn it over to anesthesia and see about proceeding He will take care of Lovenox bridge around time of surgery -------------------------------- Staff, please let patient know that I spoke with Dr. Ninfa Linden and for her to give his office a call

## 2016-11-22 NOTE — Telephone Encounter (Signed)
Surgeon's office called; they would like me to order the Lovenox; stop coumadin, start Lovenox five days prior to surgery; they will manage the Lovenox and coumadin immediately after surgery I'm dosing 1 mg/kg every 12 hours (max 100 mg per injection); Rx sent

## 2016-11-22 NOTE — Telephone Encounter (Signed)
Spoke with the pt and notified pt to give his office a call. Pt agreed .

## 2016-11-22 NOTE — Telephone Encounter (Signed)
Called Dr. Trevor Mace office. He is in [employer] this week but she will page him and ask him to give Dr. lada a call on her cell phone.

## 2016-11-23 ENCOUNTER — Other Ambulatory Visit: Payer: Self-pay

## 2016-11-23 ENCOUNTER — Encounter: Payer: Self-pay | Admitting: Family Medicine

## 2016-11-23 ENCOUNTER — Telehealth: Payer: Self-pay

## 2016-11-23 LAB — PROTIME-INR: INR: 3.5 — AB (ref 0.9–1.1)

## 2016-11-23 NOTE — Telephone Encounter (Signed)
Patient notified

## 2016-11-23 NOTE — Telephone Encounter (Signed)
She will stop coumadin and start Lovenox five days before surgery; since I don't know when that is, the surgeon will tell her

## 2016-11-23 NOTE — Telephone Encounter (Signed)
Patient notified about coumadin dosage change.  Wants to know if you are the surgeon will tell her when to take the lovenox shots?

## 2016-11-27 ENCOUNTER — Other Ambulatory Visit: Payer: Self-pay

## 2016-11-27 MED ORDER — WARFARIN SODIUM 4 MG PO TABS: 12.0000 mg | ORAL_TABLET | Freq: Every day | ORAL | 1 refills | Status: DC

## 2016-11-27 NOTE — Telephone Encounter (Signed)
Patients ins. Is requesting 90 day supply.

## 2016-11-28 ENCOUNTER — Telehealth: Payer: Self-pay

## 2016-11-28 NOTE — Telephone Encounter (Signed)
Patient called states would like you to give her a call back on house phone first and if she does not answer try her cell.  She would like to discuss Lovenox injections.

## 2016-11-28 NOTE — Telephone Encounter (Signed)
I tried home, no answer I tried cell, left message Explained that shots will replace the coumadin; she'll start the shots every 12 hours Those start five days prior to her surgery Sorry to have missed her I'm out of the office later today and tomorrow Pharmacist may be able to help answer a particular question, or she can call surgeon (if question is about when to start Lovenox, as I don't know when her surgery is); colleague here may be able to help in my absence Sorry to have missed her

## 2016-11-29 ENCOUNTER — Telehealth: Payer: Self-pay | Admitting: Hematology and Oncology

## 2016-11-29 NOTE — Telephone Encounter (Signed)
Unable to reach pt through numbers listed. Mailed appt letter for 3/19 appt on 2/23

## 2016-11-30 ENCOUNTER — Other Ambulatory Visit: Payer: Self-pay

## 2016-11-30 ENCOUNTER — Telehealth: Payer: Self-pay

## 2016-11-30 LAB — PROTIME-INR: INR: 2.4 — AB (ref 0.9–1.1)

## 2016-11-30 MED ORDER — ENOXAPARIN SODIUM 100 MG/ML ~~LOC~~ SOLN: 100.0000 mg | Freq: Two times a day (BID) | SUBCUTANEOUS | 0 refills | Status: DC

## 2016-11-30 NOTE — Telephone Encounter (Signed)
Called patient to let her know about directions for coumadin.  She states is very upset because she did not receive a call from you the other day?  I told her in your note you documented that you tried her house and cell and left voicemail.  She states did not receive a call or voicemail. She is also upset that her rx for Lovenox was sent to the wrong pharmacy.  I have since updated her pharmacy list and called it into the correct one.  Please try both numbers.  She is leaving for the store in 1 hr. Please return her call.

## 2016-11-30 NOTE — Telephone Encounter (Signed)
I called mobile number; left detailed message; sorry that she is disappointed but I did call her on Wednesday; I apologized for whatever I have done to wrong her; it appears that Roselyn Reef took care of whatever happened with the pharmacy issue; again, I apologize for however I have disappointed her; I am here to serve ---------------------------- I called patient back She says we need to discuss these shots; she called the insurance company, about the tier December 14, 2016 is her surgery Last dose of coumadin will be December 08, 2016 December 09, 2016 will be her first dose of Lovenox, last dose will be December 13, 2016 in the evening No need for PT/INR while on the Lovenox She was upset about the pharmacy issue She asked if she'll need Lovenox after surgery Same price if she gets 10 shots or 30 shots; I'm find to send in 30 syringes, as she very likely will need more Lovenox afterwards, may need again for other procedure Sent to correct pharmacy, confirmed with her I explained again that I called on Wednesday and did try to return her call; I apologize that she did not get my message Surgeon's nurse is Malachy Mood  See below... -------------------------------    11/28/16 2:38 PM  Note    I tried home, no answer I tried cell, left message Explained that shots will replace the coumadin; she'll start the shots every 12 hours Those start five days prior to her surgery Sorry to have missed her I'm out of the office later today and tomorrow Pharmacist may be able to help answer a particular question, or she can call surgeon (if question is about when to start Lovenox, as I don't know when her surgery is); colleague here may be able to help in my absence Sorry to have missed her

## 2016-12-02 ENCOUNTER — Encounter: Payer: Self-pay | Admitting: Family Medicine

## 2016-12-10 ENCOUNTER — Encounter (HOSPITAL_COMMUNITY)
Admission: RE | Admit: 2016-12-10 | Discharge: 2016-12-10 | Disposition: A | Discharge status: Home / Self Care | Payer: PPO | Source: Ambulatory Visit | Attending: Surgery | Admitting: Surgery

## 2016-12-10 ENCOUNTER — Encounter (HOSPITAL_COMMUNITY): Payer: Self-pay

## 2016-12-10 DIAGNOSIS — Z950 Presence of cardiac pacemaker: Secondary | ICD-10-CM | POA: Diagnosis not present

## 2016-12-10 DIAGNOSIS — N183 Chronic kidney disease, stage 3 (moderate): Secondary | ICD-10-CM | POA: Diagnosis not present

## 2016-12-10 DIAGNOSIS — Z90710 Acquired absence of both cervix and uterus: Secondary | ICD-10-CM | POA: Diagnosis not present

## 2016-12-10 DIAGNOSIS — Z01812 Encounter for preprocedural laboratory examination: Secondary | ICD-10-CM | POA: Insufficient documentation

## 2016-12-10 DIAGNOSIS — Z930 Tracheostomy status: Secondary | ICD-10-CM | POA: Diagnosis not present

## 2016-12-10 DIAGNOSIS — Z8249 Family history of ischemic heart disease and other diseases of the circulatory system: Secondary | ICD-10-CM | POA: Diagnosis not present

## 2016-12-10 DIAGNOSIS — I252 Old myocardial infarction: Secondary | ICD-10-CM | POA: Diagnosis not present

## 2016-12-10 DIAGNOSIS — C50911 Malignant neoplasm of unspecified site of right female breast: Secondary | ICD-10-CM | POA: Diagnosis not present

## 2016-12-10 DIAGNOSIS — I2510 Atherosclerotic heart disease of native coronary artery without angina pectoris: Secondary | ICD-10-CM | POA: Diagnosis not present

## 2016-12-10 DIAGNOSIS — I482 Chronic atrial fibrillation: Secondary | ICD-10-CM | POA: Diagnosis not present

## 2016-12-10 DIAGNOSIS — Z6841 Body Mass Index (BMI) 40.0 and over, adult: Secondary | ICD-10-CM | POA: Diagnosis not present

## 2016-12-10 DIAGNOSIS — F17210 Nicotine dependence, cigarettes, uncomplicated: Secondary | ICD-10-CM | POA: Diagnosis not present

## 2016-12-10 DIAGNOSIS — Z7901 Long term (current) use of anticoagulants: Secondary | ICD-10-CM | POA: Diagnosis not present

## 2016-12-10 DIAGNOSIS — K219 Gastro-esophageal reflux disease without esophagitis: Secondary | ICD-10-CM | POA: Diagnosis not present

## 2016-12-10 DIAGNOSIS — Z8673 Personal history of transient ischemic attack (TIA), and cerebral infarction without residual deficits: Secondary | ICD-10-CM | POA: Diagnosis not present

## 2016-12-10 DIAGNOSIS — I129 Hypertensive chronic kidney disease with stage 1 through stage 4 chronic kidney disease, or unspecified chronic kidney disease: Secondary | ICD-10-CM | POA: Diagnosis not present

## 2016-12-10 DIAGNOSIS — Z9221 Personal history of antineoplastic chemotherapy: Secondary | ICD-10-CM | POA: Diagnosis not present

## 2016-12-10 LAB — BASIC METABOLIC PANEL
Anion gap: 6 (ref 5–15)
BUN: 14 mg/dL (ref 6–20)
CALCIUM: 9.6 mg/dL (ref 8.9–10.3)
CHLORIDE: 107 mmol/L (ref 101–111)
CO2: 27 mmol/L (ref 22–32)
CREATININE: 1.33 mg/dL — AB (ref 0.44–1.00)
GFR calc non Af Amer: 42 mL/min — ABNORMAL LOW (ref >60)
GFR, EST AFRICAN AMERICAN: 48 mL/min — AB (ref >60)
GLUCOSE: 100 mg/dL — AB (ref 65–99)
Potassium: 3.4 mmol/L — ABNORMAL LOW (ref 3.5–5.1)
Sodium: 140 mmol/L (ref 135–145)

## 2016-12-10 LAB — CBC
HEMATOCRIT: 37 % (ref 36.0–46.0)
HEMOGLOBIN: 11.7 g/dL — AB (ref 12.0–15.0)
MCH: 27.9 pg (ref 26.0–34.0)
MCHC: 31.6 g/dL (ref 30.0–36.0)
MCV: 88.3 fL (ref 78.0–100.0)
Platelets: 215 10*3/uL (ref 150–400)
RBC: 4.19 MIL/uL (ref 3.87–5.11)
RDW: 14 % (ref 11.5–15.5)
WBC: 3.9 10*3/uL — ABNORMAL LOW (ref 4.0–10.5)

## 2016-12-10 NOTE — Pre-Procedure Instructions (Addendum)
Khaira Ihde  12/10/2016      [id number] - [place], [place] - [address] [place] [address] Phone: [phone] Fax: [phone]    Your procedure is scheduled on 12/13/16  Report to [facility] Admitting at 1100 A.M.  Call this number if you have problems the morning of surgery:  [phone]   Remember:  Do not eat food or drink liquids after midnight.  Take these medicines the morning of surgery with A SIP OF WATER     Gabapentin,oxycodone if needed  Warfarin per dr last dose 12/08/16     Do not wear jewelry, make-up or nail polish.  Do not wear lotions, powders, or perfumes, or deoderant.  Do not shave 48 hours prior to surgery.  Men may shave face and neck.  Do not bring valuables to the hospital.  [facility] is not responsible for any belongings or valuables.  Contacts, dentures or bridgework may not be worn into surgery.  Leave your suitcase in the car.  After surgery it may be brought to your room.  For patients admitted to the hospital, discharge time will be determined by your treatment team.  Patients discharged the day of surgery will not be allowed to drive home.   Special instructions:   Special Instructions: [facility] - Preparing for Surgery  Before surgery, you can play an important role.  Because skin is not sterile, your skin needs to be as free of germs as possible.  You can reduce the number of germs on you skin by washing with CHG (chlorahexidine gluconate) soap before surgery.  CHG is an antiseptic cleaner which kills germs and bonds with the skin to continue killing germs even after washing.  Please DO NOT use if you have an allergy to CHG or antibacterial soaps.  If your skin becomes reddened/irritated stop using the CHG and inform your nurse when you arrive at Short Stay.  Do not shave (including legs and underarms) for at least 48 hours prior to the first CHG shower.  You may shave your  face.  Please follow these instructions carefully:   1.  Shower with CHG Soap the night before surgery and the morning of Surgery.  2.  If you choose to wash your hair, wash your hair first as usual with your normal shampoo.  3.  After you shampoo, rinse your hair and body thoroughly to remove the Shampoo.  4.  Use CHG as you would any other liquid soap.  You can apply chg directly  to the skin and wash gently with scrungie or a clean washcloth.  5.  Apply the CHG Soap to your body ONLY FROM THE NECK DOWN.  Do not use on open wounds or open sores.  Avoid contact with your eyes ears, mouth and genitals (private parts).  Wash genitals (private parts)       with your normal soap.  6.  Wash thoroughly, paying special attention to the area where your surgery will be performed.  7.  Thoroughly rinse your body with warm water from the neck down.  8.  DO NOT shower/wash with your normal soap after using and rinsing off the CHG Soap.  9.  Pat yourself dry with a clean towel.            10.  Wear clean pajamas.            11.  Place clean sheets on your  bed the night of your first shower and do not sleep with pets.  Day of Surgery  Do not apply any lotions/deodorants the morning of surgery.  Please wear clean clothes to the hospital/surgery center.  Please read over the fact sheets that you were given.
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
## 2016-12-11 ENCOUNTER — Encounter: Payer: Self-pay | Admitting: *Deleted

## 2016-12-11 ENCOUNTER — Telehealth: Payer: Self-pay | Admitting: *Deleted

## 2016-12-11 ENCOUNTER — Encounter (HOSPITAL_COMMUNITY): Payer: Self-pay

## 2016-12-11 NOTE — Telephone Encounter (Signed)
Pt left vm stating she was admitted to [facility] and request return call. Called pt back and left vm requesting return call. Contact information provided.

## 2016-12-12 MED ORDER — CEFAZOLIN SODIUM 10 G IJ SOLR
3.0000 g | INTRAMUSCULAR | Status: DC
  Filled 2016-12-12: qty 3000

## 2016-12-12 MED ORDER — SODIUM CHLORIDE 0.9 % IV SOLN: INTRAVENOUS | Status: DC

## 2016-12-12 NOTE — H&P (Signed)
[name] is an 66 y.o. female.   Chief Complaint: right breast cancer HPI: this is a 66 year old with multiple complex medical issues who presented late last year with a large right breast cancer and hyperplasia in a lesion in the left breast.  She eventually agreed to port insertion and neoadjuvant chemotherapy. She now presents for definitive surgery on the breasts.  She has a chronic trach and is on anticoag meds.  She is currently stable.  Past Medical History:  Diagnosis Date  . 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 Anticoagulated on Coumadin   . Arthritis   . Atherosclerosis of coronary artery of native heart without angina pectoris   . Atrial fibrillation, chronic ([place])   . 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 Breast cancer ([place]) 06/21/2016   Right breast  . CHF (congestive heart failure) ([place])   . Chronic hypokalemia   . Chronic kidney disease    stage III  . Chronic pain due to trauma   . Difficult intubation    permanent tracheostomy;lungs filled with water when extubated  . Frequent headaches   . GERD (gastroesophageal reflux disease)   . History of stomach ulcers   . Hypertension   . Hypertension, goal below 140/90   . Myocardial infarction   . PONV (postoperative nausea and vomiting)   . 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 Spinal headache   . 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 Trachea, ring   . Tracheostomy in place [facility])   . Urinary tract infection, recurrent   . Urine incontinence     Past Surgical History:  Procedure Laterality Date  . ABDOMINAL HYSTERECTOMY    . BACK SURGERY    . BLADDER SURGERY     hitch  . CHOLECYSTECTOMY    . FRACTURE SURGERY     B/L feet and right ankle  . GANGLION CYST EXCISION  2011  . PACEMAKER INSERTION  10/29/2005  . PACEMAKER PLACEMENT  08/22/2012  . PORT A CATH INJECTION ([place] H[facility])  07/18/2016  . PORTACATH  PLACEMENT Right 07/18/2016   Procedure: INSERTION PORT-A-CATH;  Surgeon: Coralie Keens, MD;  Location: [facility];  Service: General;  Laterality: Right;  . TRACHEAL SURGERY  2000  . UVULOPALATOPHARYNGOPLASTY      Family History  Problem Relation Age of Onset  . Heart disease Mother   . Hypertension Mother   . Anemia Mother   . Heart disease Father   . Hypertension Father   . Anemia Father   . Cancer     Social History:  reports that she has been smoking Cigarettes.  She has smoked for the past 40.00 years. She has never used smokeless tobacco. She reports that she does not drink alcohol or use drugs.  Allergies:  Allergies  Allergen Reactions  . Lisinopril Cough  . Eggs Or Egg-Derived Products Rash    Raw eggs ONLY (boiled, scrambled, etc)  If baked into something it is NOT a issue    No prescriptions prior to admission.    No results found for this or any previous visit (from the past 48 hour(s)). No results found.  Review of Systems  All other systems reviewed and are negative.   There were no vitals taken for this visit. Physical Exam   General Mental Status-Alert. General Appearance-Consistent with stated age. Hydration-Well hydrated. Voice-Normal.  Head and Neck  Head-normocephalic, atraumatic with no lesions or palpable masses. Trachea-midline. Note: Tracheostomy in place Thyroid Gland Characteristics - normal size and consistency.  Eye Eyeball - Bilateral-Extraocular movements intact. Sclera/Conjunctiva - Bilateral-No scleral icterus.  Chest and Lung Exam Chest and lung exam reveals -quiet, even and easy respiratory effort with no use of accessory muscles and on auscultation, normal breath sounds, no adventitious sounds and normal vocal resonance. Inspection Chest Wall - Normal. Back - normal.  Breast Breast - Left-Symmetric, Non Tender, No Biopsy scars, no Dimpling, No Inflammation, No Lumpectomy scars, No Mastectomy scars, No  Peau d' Orange. Breast - Right-Tender, Dimpling and Inflamed, No Biopsy scars, No Lumpectomy scars, No Mastectomy scars, No Peau d' Orange. Note: There is swelling and tenderness diffusely of the right breast around the areola with nipple retraction. It has the feeling of a very large mass in the breast. There is no open wounds and no drainage currently from the nipple. Breast Lump-No Palpable Breast Mass.  Cardiovascular Cardiovascular examination reveals -normal heart sounds, regular rate and rhythm with no murmurs and normal pedal pulses bilaterally.  Abdomen Inspection Inspection of the abdomen reveals - No Hernias. Skin - Scar - no surgical scars. Palpation/Percussion Palpation and Percussion of the abdomen reveal - Soft, Non Tender, No Rebound tenderness, No Rigidity (guarding) and No hepatosplenomegaly. Auscultation Auscultation of the abdomen reveals - Bowel sounds normal.  Neurologic Neurologic evaluation reveals -alert and oriented x 3 with no impairment of recent or remote memory. Mental Status-Normal.  Musculoskeletal Normal Exam - Left-Upper Extremity Strength Normal and Lower Extremity Strength Normal. Normal Exam - Right-Upper Extremity Strength Normal and Lower Extremity Strength Normal.  Lymphatic Head & Neck  General Head & Neck Lymphatics: Bilateral - Description - Normal. Axillary  General Axillary Region: Bilateral - Description - Normal. Tenderness - Non Tender. Femoral & Inguinal  Generalized Femoral & Inguinal Lymphatics: Bilateral - Description - Normal. Tenderness - Non Tender.  Assessment/Plan Patient with right breast cancer and left breast hyperplasia s/p neoadjuvant chemotherapy.  After a long discussion, we will now proceed to the OR for a right modified radical mastectomy and left simple mastectomy.  We discussed the risks in detail.  These include but are not limited to bleeding, infection, injury to surrounding structures, need for  prolonged drains, cardiopulmonary problems, DVT, need for further surgery, post op recovery, etc.  She agrees to proceed with surgery.  Harl Bowie, MD 12/12/2016, 5:22 PM

## 2016-12-13 ENCOUNTER — Inpatient Hospital Stay (HOSPITAL_COMMUNITY): Payer: PPO | Admitting: Vascular Surgery

## 2016-12-13 ENCOUNTER — Inpatient Hospital Stay (HOSPITAL_COMMUNITY)
Admission: RE | Admit: 2016-12-13 | Discharge: 2016-12-18 | DRG: 582 | Disposition: A | Discharge status: Home Health | Payer: PPO | Source: Ambulatory Visit | Attending: Surgery | Admitting: Surgery

## 2016-12-13 ENCOUNTER — Inpatient Hospital Stay (HOSPITAL_COMMUNITY): Payer: PPO | Admitting: Anesthesiology

## 2016-12-13 ENCOUNTER — Encounter (HOSPITAL_COMMUNITY): Payer: Self-pay | Admitting: *Deleted

## 2016-12-13 ENCOUNTER — Encounter (HOSPITAL_COMMUNITY)
Admission: RE | Disposition: A | Discharge status: Home Health | Payer: Self-pay | Source: Ambulatory Visit | Attending: Surgery

## 2016-12-13 DIAGNOSIS — Z90710 Acquired absence of both cervix and uterus: Secondary | ICD-10-CM | POA: Diagnosis not present

## 2016-12-13 DIAGNOSIS — I129 Hypertensive chronic kidney disease with stage 1 through stage 4 chronic kidney disease, or unspecified chronic kidney disease: Secondary | ICD-10-CM | POA: Diagnosis present

## 2016-12-13 DIAGNOSIS — J9690 Respiratory failure, unspecified, unspecified whether with hypoxia or hypercapnia: Secondary | ICD-10-CM | POA: Diagnosis not present

## 2016-12-13 DIAGNOSIS — G8918 Other acute postprocedural pain: Secondary | ICD-10-CM | POA: Diagnosis not present

## 2016-12-13 DIAGNOSIS — I2510 Atherosclerotic heart disease of native coronary artery without angina pectoris: Secondary | ICD-10-CM | POA: Diagnosis present

## 2016-12-13 DIAGNOSIS — R269 Unspecified abnormalities of gait and mobility: Secondary | ICD-10-CM | POA: Diagnosis not present

## 2016-12-13 DIAGNOSIS — I482 Chronic atrial fibrillation: Secondary | ICD-10-CM | POA: Diagnosis not present

## 2016-12-13 DIAGNOSIS — Z8673 Personal history of transient ischemic attack (TIA), and cerebral infarction without residual deficits: Secondary | ICD-10-CM | POA: Diagnosis not present

## 2016-12-13 DIAGNOSIS — N183 Chronic kidney disease, stage 3 (moderate): Secondary | ICD-10-CM | POA: Diagnosis present

## 2016-12-13 DIAGNOSIS — Z7901 Long term (current) use of anticoagulants: Secondary | ICD-10-CM

## 2016-12-13 DIAGNOSIS — C50919 Malignant neoplasm of unspecified site of unspecified female breast: Secondary | ICD-10-CM | POA: Diagnosis not present

## 2016-12-13 DIAGNOSIS — Z8249 Family history of ischemic heart disease and other diseases of the circulatory system: Secondary | ICD-10-CM | POA: Diagnosis not present

## 2016-12-13 DIAGNOSIS — M1990 Unspecified osteoarthritis, unspecified site: Secondary | ICD-10-CM | POA: Diagnosis not present

## 2016-12-13 DIAGNOSIS — Z950 Presence of cardiac pacemaker: Secondary | ICD-10-CM | POA: Diagnosis not present

## 2016-12-13 DIAGNOSIS — Z9221 Personal history of antineoplastic chemotherapy: Secondary | ICD-10-CM

## 2016-12-13 DIAGNOSIS — K219 Gastro-esophageal reflux disease without esophagitis: Secondary | ICD-10-CM | POA: Diagnosis not present

## 2016-12-13 DIAGNOSIS — C50019 Malignant neoplasm of nipple and areola, unspecified female breast: Secondary | ICD-10-CM

## 2016-12-13 DIAGNOSIS — N6012 Diffuse cystic mastopathy of left breast: Secondary | ICD-10-CM | POA: Diagnosis not present

## 2016-12-13 DIAGNOSIS — Z6841 Body Mass Index (BMI) 40.0 and over, adult: Secondary | ICD-10-CM

## 2016-12-13 DIAGNOSIS — C50211 Malignant neoplasm of upper-inner quadrant of right female breast: Secondary | ICD-10-CM

## 2016-12-13 DIAGNOSIS — Z930 Tracheostomy status: Secondary | ICD-10-CM | POA: Diagnosis not present

## 2016-12-13 DIAGNOSIS — F17210 Nicotine dependence, cigarettes, uncomplicated: Secondary | ICD-10-CM | POA: Diagnosis present

## 2016-12-13 DIAGNOSIS — C50911 Malignant neoplasm of unspecified site of right female breast: Secondary | ICD-10-CM | POA: Diagnosis not present

## 2016-12-13 DIAGNOSIS — I252 Old myocardial infarction: Secondary | ICD-10-CM

## 2016-12-13 DIAGNOSIS — N6082 Other benign mammary dysplasias of left breast: Secondary | ICD-10-CM | POA: Diagnosis not present

## 2016-12-13 HISTORY — PX: MASTECTOMY MODIFIED RADICAL: SHX5962

## 2016-12-13 LAB — PROTIME-INR
INR: 1.1
Prothrombin Time: 14.3 seconds (ref 11.4–15.2)

## 2016-12-13 LAB — APTT: aPTT: 36 seconds (ref 24–36)

## 2016-12-13 SURGERY — MASTECTOMY, MODIFIED RADICAL
Anesthesia: General | Site: Breast | Laterality: Bilateral

## 2016-12-13 MED ORDER — ONDANSETRON 4 MG PO TBDP: 4.0000 mg | ORAL_TABLET | Freq: Four times a day (QID) | ORAL | Status: DC | PRN

## 2016-12-13 MED ORDER — ARTIFICIAL TEARS OP OINT
TOPICAL_OINTMENT | OPHTHALMIC | Status: AC
  Filled 2016-12-13: qty 3.5

## 2016-12-13 MED ORDER — ONDANSETRON HCL 4 MG/2ML IJ SOLN
4.0000 mg | Freq: Four times a day (QID) | INTRAMUSCULAR | Status: DC | PRN
  Administered 2016-12-14: 4 mg via INTRAVENOUS
  Filled 2016-12-13: qty 2

## 2016-12-13 MED ORDER — CHLORHEXIDINE GLUCONATE CLOTH 2 % EX PADS: 6.0000 | MEDICATED_PAD | Freq: Once | CUTANEOUS | Status: DC

## 2016-12-13 MED ORDER — PROPOFOL 10 MG/ML IV BOLUS
INTRAVENOUS | Status: DC | PRN
  Administered 2016-12-13: 200 mg via INTRAVENOUS

## 2016-12-13 MED ORDER — SUGAMMADEX SODIUM 200 MG/2ML IV SOLN
INTRAVENOUS | Status: AC
  Filled 2016-12-13: qty 2

## 2016-12-13 MED ORDER — METHOCARBAMOL 500 MG PO TABS
500.0000 mg | ORAL_TABLET | Freq: Four times a day (QID) | ORAL | Status: DC | PRN
  Administered 2016-12-13 – 2016-12-16 (×3): 500 mg via ORAL
  Filled 2016-12-13 (×3): qty 1

## 2016-12-13 MED ORDER — ONDANSETRON HCL 4 MG/2ML IJ SOLN
INTRAMUSCULAR | Status: AC
  Filled 2016-12-13: qty 2

## 2016-12-13 MED ORDER — ONDANSETRON HCL 4 MG/2ML IJ SOLN
INTRAMUSCULAR | Status: DC | PRN
  Administered 2016-12-13: 4 mg via INTRAVENOUS

## 2016-12-13 MED ORDER — LACTATED RINGERS IV SOLN
INTRAVENOUS | Status: DC
  Administered 2016-12-13 (×2): via INTRAVENOUS

## 2016-12-13 MED ORDER — PHENYLEPHRINE HCL 10 MG/ML IJ SOLN
INTRAMUSCULAR | Status: DC | PRN
  Administered 2016-12-13: 25 ug/min via INTRAVENOUS

## 2016-12-13 MED ORDER — HYDROMORPHONE HCL 2 MG/ML IJ SOLN
1.0000 mg | INTRAMUSCULAR | Status: DC | PRN
  Administered 2016-12-14 (×2): 1 mg via INTRAVENOUS
  Filled 2016-12-13 (×2): qty 1

## 2016-12-13 MED ORDER — CEFAZOLIN SODIUM-DEXTROSE 2-3 GM-% IV SOLR
INTRAVENOUS | Status: DC | PRN
  Administered 2016-12-13: 2 g via INTRAVENOUS

## 2016-12-13 MED ORDER — FENTANYL CITRATE (PF) 100 MCG/2ML IJ SOLN
INTRAMUSCULAR | Status: DC | PRN
  Administered 2016-12-13 (×4): 50 ug via INTRAVENOUS

## 2016-12-13 MED ORDER — BUPIVACAINE-EPINEPHRINE (PF) 0.25% -1:200000 IJ SOLN
INTRAMUSCULAR | Status: DC | PRN
  Administered 2016-12-13 (×2): 12.5 mL

## 2016-12-13 MED ORDER — PROPOFOL 10 MG/ML IV BOLUS
INTRAVENOUS | Status: AC
  Filled 2016-12-13: qty 20

## 2016-12-13 MED ORDER — FENTANYL CITRATE (PF) 100 MCG/2ML IJ SOLN
INTRAMUSCULAR | Status: AC
  Filled 2016-12-13: qty 2

## 2016-12-13 MED ORDER — MIDAZOLAM HCL 2 MG/2ML IJ SOLN
INTRAMUSCULAR | Status: AC
  Administered 2016-12-13: 1 mg
  Filled 2016-12-13: qty 2

## 2016-12-13 MED ORDER — HYDROCHLOROTHIAZIDE 25 MG PO TABS
12.5000 mg | ORAL_TABLET | Freq: Every day | ORAL | Status: DC
  Administered 2016-12-13 – 2016-12-17 (×5): 12.5 mg via ORAL
  Filled 2016-12-13 (×5): qty 1

## 2016-12-13 MED ORDER — DIPHENHYDRAMINE HCL 50 MG/ML IJ SOLN: 25.0000 mg | Freq: Four times a day (QID) | INTRAMUSCULAR | Status: DC | PRN

## 2016-12-13 MED ORDER — DEXAMETHASONE SODIUM PHOSPHATE 10 MG/ML IJ SOLN
INTRAMUSCULAR | Status: AC
  Filled 2016-12-13: qty 1

## 2016-12-13 MED ORDER — ROCURONIUM BROMIDE 50 MG/5ML IV SOSY
PREFILLED_SYRINGE | INTRAVENOUS | Status: AC
  Filled 2016-12-13: qty 10

## 2016-12-13 MED ORDER — ARTIFICIAL TEARS OP OINT
TOPICAL_OINTMENT | OPHTHALMIC | Status: DC | PRN
  Administered 2016-12-13: 1 via OPHTHALMIC

## 2016-12-13 MED ORDER — PHENYLEPHRINE HCL 10 MG/ML IJ SOLN
INTRAMUSCULAR | Status: DC | PRN
  Administered 2016-12-13: 40 ug via INTRAVENOUS
  Administered 2016-12-13: 80 ug via INTRAVENOUS

## 2016-12-13 MED ORDER — PROMETHAZINE HCL 25 MG/ML IJ SOLN: 6.2500 mg | INTRAMUSCULAR | Status: DC | PRN

## 2016-12-13 MED ORDER — MIDAZOLAM HCL 5 MG/5ML IJ SOLN
INTRAMUSCULAR | Status: DC | PRN
  Administered 2016-12-13 (×2): 1 mg via INTRAVENOUS

## 2016-12-13 MED ORDER — MIDAZOLAM HCL 2 MG/2ML IJ SOLN
INTRAMUSCULAR | Status: AC
  Filled 2016-12-13: qty 2

## 2016-12-13 MED ORDER — BUPIVACAINE-EPINEPHRINE (PF) 0.5% -1:200000 IJ SOLN
INTRAMUSCULAR | Status: DC | PRN
  Administered 2016-12-13 (×2): 12.5 mL

## 2016-12-13 MED ORDER — PHENYLEPHRINE 40 MCG/ML (10ML) SYRINGE FOR IV PUSH (FOR BLOOD PRESSURE SUPPORT)
PREFILLED_SYRINGE | INTRAVENOUS | Status: AC
  Filled 2016-12-13: qty 10

## 2016-12-13 MED ORDER — DIPHENHYDRAMINE HCL 25 MG PO CAPS: 25.0000 mg | ORAL_CAPSULE | Freq: Four times a day (QID) | ORAL | Status: DC | PRN

## 2016-12-13 MED ORDER — OXYCODONE HCL 5 MG PO TABS
5.0000 mg | ORAL_TABLET | ORAL | Status: DC | PRN
  Administered 2016-12-13 – 2016-12-17 (×8): 10 mg via ORAL
  Filled 2016-12-13 (×8): qty 2

## 2016-12-13 MED ORDER — SUCCINYLCHOLINE CHLORIDE 20 MG/ML IJ SOLN
INTRAMUSCULAR | Status: DC | PRN
Start: 2016-12-13 — End: 2016-12-13
  Administered 2016-12-13: 100 mg via INTRAVENOUS

## 2016-12-13 MED ORDER — EPHEDRINE SULFATE 50 MG/ML IJ SOLN
INTRAMUSCULAR | Status: DC | PRN
  Administered 2016-12-13: 10 mg via INTRAVENOUS
  Administered 2016-12-13: 5 mg via INTRAVENOUS

## 2016-12-13 MED ORDER — POTASSIUM CHLORIDE IN NACL 20-0.9 MEQ/L-% IV SOLN
INTRAVENOUS | Status: DC
  Administered 2016-12-13 – 2016-12-17 (×6): via INTRAVENOUS
  Filled 2016-12-13 (×6): qty 1000

## 2016-12-13 MED ORDER — LATANOPROST 0.005 % OP SOLN
1.0000 [drp] | Freq: Every day | OPHTHALMIC | Status: DC
  Administered 2016-12-14 – 2016-12-17 (×4): 1 [drp] via OPHTHALMIC
  Filled 2016-12-13: qty 2.5

## 2016-12-13 MED ORDER — LIDOCAINE 2% (20 MG/ML) 5 ML SYRINGE
INTRAMUSCULAR | Status: AC
  Filled 2016-12-13: qty 5

## 2016-12-13 MED ORDER — LIDOCAINE HCL (CARDIAC) 20 MG/ML IV SOLN
INTRAVENOUS | Status: DC | PRN
  Administered 2016-12-13: 20 mg via INTRAVENOUS

## 2016-12-13 MED ORDER — ENOXAPARIN SODIUM 40 MG/0.4ML ~~LOC~~ SOLN
40.0000 mg | SUBCUTANEOUS | Status: DC
  Administered 2016-12-14 – 2016-12-17 (×3): 40 mg via SUBCUTANEOUS
  Filled 2016-12-13 (×4): qty 0.4

## 2016-12-13 MED ORDER — GABAPENTIN 300 MG PO CAPS
300.0000 mg | ORAL_CAPSULE | Freq: Three times a day (TID) | ORAL | Status: DC
  Administered 2016-12-13 – 2016-12-18 (×14): 300 mg via ORAL
  Filled 2016-12-13 (×14): qty 1

## 2016-12-13 MED ORDER — HYDROMORPHONE HCL 1 MG/ML IJ SOLN: 0.2500 mg | INTRAMUSCULAR | Status: DC | PRN

## 2016-12-13 MED ORDER — 0.9 % SODIUM CHLORIDE (POUR BTL) OPTIME
TOPICAL | Status: DC | PRN
  Administered 2016-12-13 (×2): 1000 mL

## 2016-12-13 MED ORDER — LOSARTAN POTASSIUM 50 MG PO TABS
100.0000 mg | ORAL_TABLET | Freq: Every day | ORAL | Status: DC
  Administered 2016-12-13 – 2016-12-15 (×3): 100 mg via ORAL
  Filled 2016-12-13 (×3): qty 2

## 2016-12-13 SURGICAL SUPPLY — 42 items
APPLIER CLIP 9.375 MED OPEN (MISCELLANEOUS) ×6
BINDER BREAST LRG (GAUZE/BANDAGES/DRESSINGS) IMPLANT
BINDER BREAST XLRG (GAUZE/BANDAGES/DRESSINGS) ×3 IMPLANT
BLADE SURG 10 STRL SS (BLADE) ×3 IMPLANT
CANISTER SUCT 3000ML PPV (MISCELLANEOUS) ×3 IMPLANT
CHLORAPREP W/TINT 26ML (MISCELLANEOUS) ×3 IMPLANT
CLIP APPLIE 9.375 MED OPEN (MISCELLANEOUS) ×2 IMPLANT
COVER SURGICAL LIGHT HANDLE (MISCELLANEOUS) ×3 IMPLANT
DERMABOND ADVANCED (GAUZE/BANDAGES/DRESSINGS) ×8
DERMABOND ADVANCED .7 DNX12 (GAUZE/BANDAGES/DRESSINGS) ×4 IMPLANT
DRAIN CHANNEL 19F RND (DRAIN) ×6 IMPLANT
DRAPE LAPAROSCOPIC ABDOMINAL (DRAPES) ×3 IMPLANT
DRAPE UTILITY XL STRL (DRAPES) ×6 IMPLANT
DRSG PAD ABDOMINAL 8X10 ST (GAUZE/BANDAGES/DRESSINGS) ×12 IMPLANT
ELECT REM PT RETURN 9FT ADLT (ELECTROSURGICAL) ×3
ELECTRODE REM PT RTRN 9FT ADLT (ELECTROSURGICAL) ×1 IMPLANT
EVACUATOR SILICONE 100CC (DRAIN) ×6 IMPLANT
GAUZE SPONGE 4X4 12PLY STRL (GAUZE/BANDAGES/DRESSINGS) ×3 IMPLANT
GLOVE SURG SIGNA 7.5 PF LTX (GLOVE) ×3 IMPLANT
GOWN STRL REUS W/ TWL LRG LVL3 (GOWN DISPOSABLE) ×3 IMPLANT
GOWN STRL REUS W/ TWL XL LVL3 (GOWN DISPOSABLE) ×1 IMPLANT
GOWN STRL REUS W/TWL LRG LVL3 (GOWN DISPOSABLE) ×6
GOWN STRL REUS W/TWL XL LVL3 (GOWN DISPOSABLE) ×2
KIT BASIN OR (CUSTOM PROCEDURE TRAY) ×3 IMPLANT
KIT ROOM TURNOVER OR (KITS) ×3 IMPLANT
NS IRRIG 1000ML POUR BTL (IV SOLUTION) ×3 IMPLANT
PACK GENERAL/GYN (CUSTOM PROCEDURE TRAY) ×3 IMPLANT
PAD ARMBOARD 7.5X6 YLW CONV (MISCELLANEOUS) ×6 IMPLANT
SPECIMEN JAR X LARGE (MISCELLANEOUS) ×3 IMPLANT
SPONGE LAP 18X18 X RAY DECT (DISPOSABLE) ×9 IMPLANT
STAPLER VISISTAT 35W (STAPLE) ×3 IMPLANT
SUT ETHILON 2 0 FS 18 (SUTURE) ×9 IMPLANT
SUT ETHILON 3 0 FSL (SUTURE) IMPLANT
SUT MON AB 4-0 PC3 18 (SUTURE) ×12 IMPLANT
SUT VIC AB 3-0 SH 18 (SUTURE) ×15 IMPLANT
SUT VIC AB 3-0 SH 27 (SUTURE) ×4
SUT VIC AB 3-0 SH 27XBRD (SUTURE) ×2 IMPLANT
SUT VICRYL AB 2 0 TIES (SUTURE) ×3 IMPLANT
TOWEL OR 17X24 6PK STRL BLUE (TOWEL DISPOSABLE) ×3 IMPLANT
TOWEL OR 17X26 10 PK STRL BLUE (TOWEL DISPOSABLE) ×3 IMPLANT
TRAY FOLEY CATH SILVER 14FR (SET/KITS/TRAYS/PACK) ×3 IMPLANT
WATER STERILE IRR 1000ML POUR (IV SOLUTION) IMPLANT

## 2016-12-13 NOTE — Anesthesia Preprocedure Evaluation (Addendum)
Anesthesia Evaluation  Patient identified by MRN, date of birth, ID band Patient awake    Reviewed: Allergy & Precautions, NPO status , Patient's Chart, lab work & pertinent test results  History of Anesthesia Complications (+) PONV, DIFFICULT AIRWAY, POST - OP SPINAL HEADACHE and history of anesthetic complications  Airway Mallampati: II  TM Distance: >3 FB Neck ROM: Full    Dental no notable dental hx. (+) Dental Advisory Given   Pulmonary sleep apnea , Current Smoker, former smoker,    Pulmonary exam normal breath sounds clear to auscultation       Cardiovascular hypertension, + CAD, + Past MI, + Peripheral Vascular Disease and +CHF  Normal cardiovascular exam+ pacemaker  Rhythm:Regular Rate:Normal  ECHO 07-18-16: Study Conclusions  - Left ventricle: The cavity size was normal. Wall thickness was   increased in a pattern of mild LVH. Systolic function was normal.   The estimated ejection fraction was in the range of 55% to 60%.   Wall motion was normal; there were no regional wall motion   abnormalities. Left ventricular diastolic function parameters   were normal. - Aortic valve: There was mild regurgitation. - Atrial septum: No defect or patent foramen ovale was identified.   Neuro/Psych  Headaches, CVA negative psych ROS   GI/Hepatic Neg liver ROS, GERD  ,  Endo/Other  negative endocrine ROSMorbid obesity  Renal/GU Renal disease  negative genitourinary   Musculoskeletal  (+) Arthritis ,   Abdominal (+) + obese,   Peds negative pediatric ROS (+)  Hematology negative hematology ROS (+)   Anesthesia Other Findings   Reproductive/Obstetrics negative OB ROS                            Lab Results  Component Value Date   WBC 3.9 (L) 12/10/2016   HGB 11.7 (L) 12/10/2016   HCT 37.0 12/10/2016   MCV 88.3 12/10/2016   PLT 215 12/10/2016   Lab Results  Component Value Date   CREATININE 1.33 (H) 12/10/2016   BUN 14 12/10/2016   NA 140 12/10/2016   K 3.4 (L) 12/10/2016   CL 107 12/10/2016   CO2 27 12/10/2016    Anesthesia Physical  Anesthesia Plan  ASA: III  Anesthesia Plan: General   Post-op Pain Management:  Regional for Post-op pain   Induction: Intravenous  Airway Management Planned: Tracheostomy  Additional Equipment:   Intra-op Plan:   Post-operative Plan: Extubation in OR  Informed Consent: I have reviewed the patients History and Physical, chart, labs and discussed the procedure including the risks, benefits and alternatives for the proposed anesthesia with the patient or authorized representative who has indicated his/her understanding and acceptance.   Dental advisory given  Plan Discussed with: CRNA  Anesthesia Plan Comments:         Anesthesia Quick Evaluation

## 2016-12-13 NOTE — OR Nursing (Signed)
Dr. Ermalene Postin sought for clarification on any need to interrogate PPM. No need to interrogate per MD. Also updated MD on BP: instruction recvd to cont observation and acceptable unless diastolic in excess of 396.

## 2016-12-13 NOTE — Anesthesia Procedure Notes (Signed)
Anesthesia Regional Block: Pectoralis block   Pre-Anesthetic Checklist: ,, timeout performed, Correct Patient, Correct Site, Correct Laterality, Correct Procedure, Correct Position, site marked, Risks and benefits discussed,  Surgical consent,  Pre-op evaluation,  At surgeon's request and post-op pain management  Laterality: Left  Prep: chloraprep       Needles:  Injection technique: Single-shot  Needle Type: Echogenic Needle     Needle Length: 9cm  Needle Gauge: 21     Additional Needles:   Procedures: ultrasound guided,,,,,,,,  Narrative:  Start time: 12/13/2016 12:28 PM End time: 12/13/2016 12:35 PM Injection made incrementally with aspirations every 5 mL.  Performed by: Personally  Anesthesiologist: Suzette Battiest

## 2016-12-13 NOTE — Interval H&P Note (Signed)
History and Physical Interval Note: no change in H and P  12/13/2016 11:42 AM  [name]  has presented today for surgery, with the diagnosis of RIGHT BREAST CANCER  The various methods of treatment have been discussed with the patient and family. After consideration of risks, benefits and other options for treatment, the patient has consented to  Procedure(s): RIGHT MODIFIED RADICAL MASTECTOMY, LEFT SIMPLE MASTECTOMY (Bilateral) as a surgical intervention .  The patient's history has been reviewed, patient examined, no change in status, stable for surgery.  I have reviewed the patient's chart and labs.  Questions were answered to the patient's satisfaction.     [name] A

## 2016-12-13 NOTE — Anesthesia Postprocedure Evaluation (Signed)
Anesthesia Post Note  Patient: [name]  Procedure(s) Performed: Procedure(s) (LRB): RIGHT MODIFIED RADICAL MASTECTOMY, LEFT SIMPLE MASTECTOMY (Bilateral)  Patient location during evaluation: PACU Anesthesia Type: General Level of consciousness: awake and alert Pain management: pain level controlled Vital Signs Assessment: post-procedure vital signs reviewed and stable Respiratory status: spontaneous breathing, nonlabored ventilation, respiratory function stable and patient connected to nasal cannula oxygen Cardiovascular status: blood pressure returned to baseline and stable Postop Assessment: no signs of nausea or vomiting Anesthetic complications: no       Last Vitals:  Vitals:   12/13/16 1630 12/13/16 1635  BP: (!) 164/97 (!) 164/98  Pulse: 75 76  Resp: (!) 28 14  Temp:      Last Pain:  Vitals:   12/13/16 1617  TempSrc:   PainSc: Asleep                 Valdis Bevill S

## 2016-12-13 NOTE — Transfer of Care (Signed)
Immediate Anesthesia Transfer of Care Note  Patient: [name]  Procedure(s) Performed: Procedure(s): RIGHT MODIFIED RADICAL MASTECTOMY, LEFT SIMPLE MASTECTOMY (Bilateral)  Patient Location: PACU  Anesthesia Type:General  Level of Consciousness: sedated and patient cooperative  Airway & Oxygen Therapy: Patient Spontanous Breathing and Patient connected to tracheostomy mask oxygen  Post-op Assessment: Report given to RN and Post -op Vital signs reviewed and stable  Post vital signs: Reviewed and stable  Last Vitals:  Vitals:   12/13/16 1220 12/13/16 1245  BP: (!) 149/73 (!) 152/72  Pulse: 64 65  Resp: 15 (!) 8  Temp:      Last Pain:  Vitals:   12/13/16 1137  TempSrc: Oral      Patients Stated Pain Goal: 0 (60/04/59 9774)  Complications: No apparent anesthesia complications

## 2016-12-13 NOTE — Op Note (Signed)
RIGHT MODIFIED RADICAL MASTECTOMY, LEFT SIMPLE MASTECTOMY  Procedure Note  [name] 12/13/2016   Pre-op Diagnosis: RIGHT BREAST CANCER     Post-op Diagnosis: same  Procedure(s): RIGHT MODIFIED RADICAL MASTECTOMY, LEFT SIMPLE MASTECTOMY  Surgeon(s): Coralie Keens, MD  Anesthesia: General  Staff:  Circulator: Rosanne Sack, RN Relief Circulator: Christen Bame, RN Relief Scrub: Jim Like Leggio Scrub Person: Adella Hare; Harrel Lemon, RN; Burman Foster Teschner, CST  Estimated Blood Loss: 200 mL               Specimens: sent to path  Indications:  This is Jefferson 66 year old female with multiple comorbidities who presented last fall with Jefferson very large node positive right breast cancer. She is undergoing neoadjuvant therapy and now presents for Jefferson right modified radical mastectomy as well as left simple mastectomy  Procedure: The patient was brought to the operating room and identified the correct patient. She is placed upon the operating table. General anesthesia was then induced. She has Jefferson chronic trach and anesthesiology replace this with an endotracheal tube for the procedure. Her her breasts and axilla were then prepped and draped in the usual sterile fashion bilaterally. I decided to proceed with the left simple mastectomy first. I made an elliptical incision going around the nipple areolar complex with Jefferson scalpel. I then took this down to the breast tissue with electrocautery. Superior and inferior skin flaps were then created with electrocautery. I took this down to the chest wall circumferentially. I then slowly dissected the breast tissue off of the pectoralis fascia dissecting medial to lateral with the electrocautery. I then completed the mastectomy going toward the axilla removing all breast tissue. I marked the lateral aspect of the mastectomy with Jefferson suture. I then irrigated the wound saline. Hemostasis appeared to be achieved with cautery. I then made Jefferson small incision  laterally and placed Jefferson 19 [place] Blake drain into the incision. I sewed this in place with Jefferson nylon suture. I then closed the subcutaneous tissue with interrupted 3-0 Vicryl sutures and closed the skin with Jefferson running 4-0 Monocryl.  I then turned my attention to the right modified radical mastectomy. I again made Jefferson large elliptical incision around the nipple areolar complex with Jefferson scalpel. This included Jefferson large palpable masses well as abnormal skin of the breast. I then created superior name pierce skin flaps with electrocautery. There appeared to be Jefferson lot of neovascularization on the right side. Several blood vessels had to be clipped as well as suture-ligated. I took the skin flaps down to the chest wall circumferentially. I then dissected the breast off of the chest wall with electrocautery dissecting medial to lateral removing the breast from the pectoralis fashion. As I moved laterally identified the edge of the pectoralis major and pectoralis minor muscles. The patient had Jefferson very fatty axilla with Jefferson lot of large palpable lymph nodes. I performed Jefferson complete axillary dissection. Several small bridging veins were clipped with surgical clips as well as lymphatics. I removed all large palpable lymph nodes that I could identify and axilla. The thoracodorsal and long thoracic nerves appear to be spared. Once I completed the axillary dissection the entire mastectomy specimen was marked laterally with Jefferson suture in the sense pathology for evaluation. I then irrigated the chest wall and axilla with saline. Hemostasis appeared to be achieved. I made 2 separate skin incisions with Jefferson scalpel and placed two 19 [place] Blake drains into the mastectomy site and axilla. These were  sutured in place with nylon sutures. I then closed the incision with interrupted 3-0 Vicryl sutures and Jefferson running 4-0 Monocryl suture. Dermabond was then placed in both incisions. The drains were placed also to. Gauze and Jefferson binder were then applied. The  patient tolerated well.  All the counts were correct at the end of the procedure. The patient endotracheal tube was exchanged back with Jefferson tracheostomy tube by anesthesia. She was then extubated in the operating room and taken in Jefferson stable condition to the recovery room.          [name]   Date: 12/13/2016  Time: 4:01 PM

## 2016-12-13 NOTE — Anesthesia Procedure Notes (Addendum)
Procedure Name: Intubation Date/Time: 12/13/2016 1:52 PM Performed by: Suzy Bouchard Pre-anesthesia Checklist: Patient identified, Emergency Drugs available, Suction available, Patient being monitored and Timeout performed Patient Re-evaluated:Patient Re-evaluated prior to inductionOxygen Delivery Method: Circle system utilized Preoxygenation: Pre-oxygenation with 100% oxygen Intubation Type: IV induction and Tracheostomy Tube type: Oral (thru trach stoma) Tube size: 6.5 mm Number of attempts: 1 Airway Equipment and Method: Tracheostomy Placement Confirmation: positive ETCO2 and breath sounds checked- equal and bilateral Tube secured with: Tape Comments: IV induction.  Patient's pre-existing 4.0 uncuffed shiley removed and 6.5 lubricated ETT passed easily through stoma.  +BBS +[id number].  Secured in place.  4.0 shileys cuffed and uncuffed available in room.

## 2016-12-13 NOTE — Anesthesia Procedure Notes (Signed)
Anesthesia Regional Block: Pectoralis block   Pre-Anesthetic Checklist: ,, timeout performed, Correct Patient, Correct Site, Correct Laterality, Correct Procedure, Correct Position, site marked, Risks and benefits discussed,  Surgical consent,  Pre-op evaluation,  At surgeon's request and post-op pain management  Laterality: Right  Prep: chloraprep       Needles:  Injection technique: Single-shot  Needle Type: Echogenic Needle     Needle Length: 9cm  Needle Gauge: 21     Additional Needles:   Procedures: ultrasound guided,,,,,,,,  Narrative:  Start time: 12/13/2016 12:20 PM End time: 12/13/2016 12:27 PM Injection made incrementally with aspirations every 5 mL.  Performed by: Personally  Anesthesiologist: Suzette Battiest

## 2016-12-14 ENCOUNTER — Encounter (HOSPITAL_COMMUNITY): Payer: Self-pay | Admitting: Surgery

## 2016-12-14 LAB — CBC
HCT: 34.4 % — ABNORMAL LOW (ref 36.0–46.0)
Hemoglobin: 10.5 g/dL — ABNORMAL LOW (ref 12.0–15.0)
MCH: 27.6 pg (ref 26.0–34.0)
MCHC: 30.5 g/dL (ref 30.0–36.0)
MCV: 90.3 fL (ref 78.0–100.0)
PLATELETS: 220 10*3/uL (ref 150–400)
RBC: 3.81 MIL/uL — ABNORMAL LOW (ref 3.87–5.11)
RDW: 14.4 % (ref 11.5–15.5)
WBC: 7.4 10*3/uL (ref 4.0–10.5)

## 2016-12-14 LAB — BASIC METABOLIC PANEL
ANION GAP: 8 (ref 5–15)
BUN: 20 mg/dL (ref 6–20)
CALCIUM: 8.9 mg/dL (ref 8.9–10.3)
CO2: 24 mmol/L (ref 22–32)
Chloride: 106 mmol/L (ref 101–111)
Creatinine, Ser: 1.92 mg/dL — ABNORMAL HIGH (ref 0.44–1.00)
GFR calc Af Amer: 31 mL/min — ABNORMAL LOW (ref >60)
GFR, EST NON AFRICAN AMERICAN: 27 mL/min — AB (ref >60)
GLUCOSE: 133 mg/dL — AB (ref 65–99)
Potassium: 4.4 mmol/L (ref 3.5–5.1)
Sodium: 138 mmol/L (ref 135–145)

## 2016-12-14 MED ORDER — WARFARIN - PHYSICIAN DOSING INPATIENT
Freq: Every day | Status: DC
  Administered 2016-12-14 – 2016-12-16 (×3)

## 2016-12-14 MED ORDER — WARFARIN SODIUM 6 MG PO TABS
12.0000 mg | ORAL_TABLET | ORAL | Status: DC
  Administered 2016-12-14 – 2016-12-16 (×2): 12 mg via ORAL
  Filled 2016-12-14 (×4): qty 2

## 2016-12-14 MED ORDER — WARFARIN SODIUM 5 MG PO TABS
10.0000 mg | ORAL_TABLET | ORAL | Status: DC
  Administered 2016-12-15 – 2016-12-17 (×2): 10 mg via ORAL
  Filled 2016-12-14 (×3): qty 2

## 2016-12-14 MED ORDER — WARFARIN SODIUM 6 MG PO TABS: 12.0000 mg | ORAL_TABLET | Freq: Every day | ORAL | Status: DC

## 2016-12-14 NOTE — Progress Notes (Signed)
Nutrition Brief Note  Patient identified on the Malnutrition Screening Tool (MST) Report  Wt Readings from Last 15 Encounters:  12/13/16 244 lb (110.7 kg)  12/10/16 244 lb (110.7 kg)  11/13/16 247 lb 2 oz (112.1 kg)  09/25/16 289 lb 3 oz (131.2 kg)  09/20/16 245 lb 8 oz (111.4 kg)  08/31/16 248 lb 3.2 oz (112.6 kg)  08/09/16 247 lb 9.6 oz (112.3 kg)  07/26/16 264 lb 6.4 oz (119.9 kg)  07/19/16 250 lb 12.8 oz (113.8 kg)  07/18/16 259 lb 14.8 oz (117.9 kg)  07/05/16 249 lb (112.9 kg)  07/05/16 249 lb (112.9 kg)  05/30/16 247 lb 3.2 oz (112.1 kg)  05/25/16 240 lb (108.9 kg)  08/11/15 254 lb 8 oz (72.49 kg)   66 year old with multiple complex medical issues who presented late last year with a large right breast cancer and hyperplasia in a lesion in the left breast.  She eventually agreed to port insertion and neoadjuvant chemotherapy. She now presents for definitive surgery on the breasts.  She has a chronic trach and is on anticoag meds.  She is currently stable.  S/p Procedure(s) 12/13/16: RIGHT MODIFIED RADICAL MASTECTOMY, LEFT SIMPLE MASTECTOMY  Pt is sleeping soundly at time of visit. Reviewed wt hx; noted UBW ranges between 245-255#. Per previous RD notes, pt not receptive to education has has refused nutrition interventions and supplements.   Per discussion with RN, pt tolerating diet well.  Body mass index is 41.24 kg/m. Patient meets criteria for extreme obesity, class III based on current BMI.   Current diet order is clear liquid, patient is consuming approximately n/a% of meals at this time. Labs and medications reviewed.   No nutrition interventions warranted at this time. If nutrition issues arise, please consult RD.   Kenzey Birkland A. Jimmye Norman, RD, LDN, CDE Pager: [phone] After hours Pager: [phone]

## 2016-12-14 NOTE — Progress Notes (Signed)
MD paged for orders on lab draws as pt has restricted bilateral extremities

## 2016-12-14 NOTE — Evaluation (Signed)
Physical Therapy Evaluation Patient Details Name: [name] MRN: [id number] DOB: [birth year] Today's Date: 12/14/2016   History of Present Illness  Pt is a 66 y/o female s/p R modified radical mastectomy and L simple mastectomy. PMH including but not limited to a-fib, CHF, CKD, HTN, hx of MI, hx of CVA and chronic tracheostomy.   Clinical Impression  Pt presented supine in bed with HOB elevated, initially asleep and remained lethargic throughout evaluation. Pt would not discuss PLOF or d/c planning with therapist. She repeatedly stated that "you'll have to call my medical POA". Pt currently requires min A for bed mobility, mod A x2 for transfers and mod A x2 with RW to take a few side steps at EOB. Pt would continue to benefit from skilled physical therapy services at this time while admitted and after d/c to address her below listed limitations in order to improve her overall safety and independence with functional mobility.      Follow Up Recommendations SNF;Supervision/Assistance - 24 hour    Equipment Recommendations  None recommended by PT;Other (comment) (defer to next venue)    Recommendations for Other Services       Precautions / Restrictions Precautions Precautions: Fall Precaution Comments: trach collar, 3 JP drains Restrictions Weight Bearing Restrictions: No      Mobility  Bed Mobility Overal bed mobility: Needs Assistance Bed Mobility: Supine to Sit;Sit to Supine     Supine to sit: Min guard;HOB elevated Sit to supine: Min assist   General bed mobility comments: increased time, HOB elevated, use of bed rails, min A with trunk to return to supine  Transfers Overall transfer level: Needs assistance Equipment used: Rolling walker (2 wheeled) Transfers: Sit to/from Stand Sit to Stand: Mod assist;+2 physical assistance;+2 safety/equipment;From elevated surface         General transfer comment: increased time, verbal and tactile cueing for bilateral  hand placement, mod A x2 to rise from elevated bed  Ambulation/Gait Ambulation/Gait assistance: Mod assist;+2 physical assistance;+2 safety/equipment Ambulation Distance (Feet): 2 Feet Assistive device: Rolling walker (2 wheeled) Gait Pattern/deviations: Decreased step length - right;Decreased step length - left;Decreased stride length Gait velocity: decreased Gait velocity interpretation: Below normal speed for age/gender General Gait Details: pt able to take side steps at bed with mod A x2 for stability and safety  Stairs            Wheelchair Mobility    Modified Rankin (Stroke Patients Only)       Balance Overall balance assessment: Needs assistance Sitting-balance support: Feet supported Sitting balance-Leahy Scale: Fair     Standing balance support: During functional activity;Bilateral upper extremity supported Standing balance-Leahy Scale: Poor Standing balance comment: pt reliant on bilateral UEs on RW                             Pertinent Vitals/Pain Pain Assessment: 0-10 Pain Score: 10-Worst pain ever Pain Location: operative sites, drain sites Pain Descriptors / Indicators: Grimacing;Guarding;Operative site guarding Pain Intervention(s): Monitored during session;Repositioned    Home Living Family/patient expects to be discharged to:: Unsure                 Additional Comments: when asked questions regarding home environment and d/c plans, pt repeatedly stating that we need to discuss that with her medical POA. Attempted to call pt's medical POA in room with pt, but did not get an answer    Prior Function  Comments: pt not answering questions regarding PLOF     Hand Dominance        Extremity/Trunk Assessment   Upper Extremity Assessment Upper Extremity Assessment: Generalized weakness    Lower Extremity Assessment Lower Extremity Assessment: Generalized weakness       Communication   Communication:  Tracheostomy  Cognition Arousal/Alertness: Lethargic Behavior During Therapy: WFL for tasks assessed/performed Overall Cognitive Status: Difficult to assess                      General Comments      Exercises     Assessment/Plan    PT Assessment Patient needs continued PT services  PT Problem List Decreased strength;Decreased activity tolerance;Decreased balance;Decreased mobility;Decreased coordination;Decreased range of motion;Decreased knowledge of use of DME;Decreased safety awareness;Decreased knowledge of precautions;Cardiopulmonary status limiting activity;Pain       PT Treatment Interventions DME instruction;Gait training;Stair training;Functional mobility training;Therapeutic activities;Therapeutic exercise;Balance training;Neuromuscular re-education;Patient/family education    PT Goals (Current goals can be found in the Care Plan section)  Acute Rehab PT Goals Patient Stated Goal: none stated; pt very lethargic and minimally answering questions PT Goal Formulation: With patient Time For Goal Achievement: 12/28/16 Potential to Achieve Goals: Fair    Frequency Min 3X/week   Barriers to discharge        Co-evaluation               End of Session Equipment Utilized During Treatment: Oxygen (trach collar) Activity Tolerance: Patient limited by pain;Patient limited by lethargy Patient left: in bed;with call bell/phone within reach;with bed alarm set;with nursing/sitter in room Nurse Communication: Mobility status;Other (comment) (pt's JP bulb dislodged during transfers) PT Visit Diagnosis: Other abnormalities of gait and mobility (R26.89);Pain Pain - Right/Left:  (bilateral) Pain - part of body:  (operative sites)         Time: 1224-8250 PT Time Calculation (min) (ACUTE ONLY): 24 min   Charges:   PT Evaluation $PT Eval Moderate Complexity: 1 Procedure PT Treatments $Therapeutic Activity: 8-22 mins   PT G CodesClearnce Sorrel  Tifanie Gardiner 12/14/2016, 10:16 AM Sherie Don, PT, DPT [phone]

## 2016-12-14 NOTE — Progress Notes (Signed)
Pt has no PMV in room. Respiratory contacted and advised to get in touch with [facility]

## 2016-12-14 NOTE — Progress Notes (Signed)
1 Day Post-Op  Subjective: Patient complains of incisional pain  Objective: Vital signs in last 24 hours: Temp:  [97.2 F (36.2 C)-98.2 F (36.8 C)] 98.2 F (36.8 C) (03/09 0645) Pulse Rate:  [63-87] 78 (03/09 0645) Resp:  [8-28] 16 (03/09 0645) BP: (108-165)/(69-99) 116/70 (03/09 0645) SpO2:  [94 %-100 %] 96 % (03/09 0645) Weight:  [110.7 kg (244 lb)] 110.7 kg (244 lb) (03/08 1137)    Intake/Output from previous day: 03/08 0701 - 03/09 0700 In: 1820 [P.O.:120; I.V.:1700] Out: 655 [Urine:100; Drains:355; Blood:200] Intake/Output this shift: No intake/output data recorded.  Exam: Trach stable Lungs clear Dressings dry, some drainage around right JP Flaps viable  Lab Results:  No results for input(s): WBC, HGB, HCT, PLT in the last 72 hours. BMET No results for input(s): NA, K, CL, CO2, GLUCOSE, BUN, CREATININE, CALCIUM in the last 72 hours. PT/INR  Recent Labs  12/13/16 1130  LABPROT 14.3  INR 1.10   ABG No results for input(s): PHART, HCO3 in the last 72 hours.  Invalid input(s): PCO2, PO2  Studies/Results: No results found.  Anti-infectives: Anti-infectives    Start     Dose/Rate Route Frequency Ordered Stop   12/13/16 1230  ceFAZolin (ANCEF) 3 g in dextrose 5 % 50 mL IVPB  Status:  Discontinued     3 g 130 mL/hr over 30 Minutes Intravenous To Surgery 12/12/16 1006 12/13/16 1800      Assessment/Plan: s/p Procedure(s): RIGHT MODIFIED RADICAL MASTECTOMY, LEFT SIMPLE MASTECTOMY (Bilateral)  Given her multiple co-morbidities, I think she will need short term rehab/SNF around 2 weeks for drain care, etc. Will have case manager assess. Restart coumadin PT consult  LOS: 1 day    Wylie Coon A 12/14/2016

## 2016-12-15 LAB — CBC
HEMATOCRIT: 30.9 % — AB (ref 36.0–46.0)
Hemoglobin: 9.6 g/dL — ABNORMAL LOW (ref 12.0–15.0)
MCH: 27.8 pg (ref 26.0–34.0)
MCHC: 31.1 g/dL (ref 30.0–36.0)
MCV: 89.6 fL (ref 78.0–100.0)
Platelets: 190 10*3/uL (ref 150–400)
RBC: 3.45 MIL/uL — AB (ref 3.87–5.11)
RDW: 14.4 % (ref 11.5–15.5)
WBC: 5.7 10*3/uL (ref 4.0–10.5)

## 2016-12-15 LAB — PROTIME-INR
INR: 1.29
Prothrombin Time: 16.2 seconds — ABNORMAL HIGH (ref 11.4–15.2)

## 2016-12-15 NOTE — Clinical Social Work Note (Signed)
[facility] does not have matching identifying information as Epic therefore CSW unable to receive [facility] at this time. [facility] closed on the weekend. Pt will be unable to d/c to SNF w/o [facility]. CSW will have to contact [facility] on Monday to determine the different information.  [address], [place]

## 2016-12-15 NOTE — Progress Notes (Signed)
2 Days Post-Op  Subjective: Stable and alert.  Pain control good She has lots of concerns about her bandage, blood draws and going to a nursing home. I reviewed all this with the patient and with her RN.  Right sided drainage 270 mL.  Left-sided drainage 110 mL. CBC and INR pending  Objective: Vital signs in last 24 hours: Temp:  [97.6 F (36.4 C)-98.9 F (37.2 C)] 98.4 F (36.9 C) (03/10 0526) Pulse Rate:  [72-90] 72 (03/10 0915) Resp:  [16-19] 18 (03/10 0915) BP: (112-150)/(55-70) 150/70 (03/10 0526) SpO2:  [89 %-96 %] 94 % (03/10 0915)    Intake/Output from previous day: 03/09 0701 - 03/10 0700 In: 678 [P.O.:598] Out: 1680 [Urine:1300; Drains:380] Intake/Output this shift: No intake/output data recorded.  General appearance: Alert.  No obvious distress.  Numerous Concerns verbalized.  Mental status normal. Resp: clear to auscultation bilaterally Chest wall: no tenderness, Bilateral mastectomy skin flaps healthy and viable.  No obvious ischemia.  No retained fluid or hematoma.  Lab Results:  No results found for this or any previous visit (from the past 24 hour(s)).   Studies/Results: No results found.  . enoxaparin (LOVENOX) injection  40 mg Subcutaneous Q24H  . gabapentin  300 mg Oral TID  . hydrochlorothiazide  12.5 mg Oral QHS  . latanoprost  1 drop Both Eyes QHS  . losartan  100 mg Oral QHS  . warfarin  10 mg Oral Q48H  . warfarin  12 mg Oral Q48H  . Warfarin - Physician Dosing Inpatient   Does not apply q1800     Assessment/Plan: s/p Procedure(s): RIGHT MODIFIED RADICAL MASTECTOMY, LEFT SIMPLE MASTECTOMY  POD #1.  Right MRM and left simple mastectomy Stable Advance diet and activities Ambulated hall Dr. Ninfa Linden has recommended CIR or SNF since she lives alone Today, she is opposed to that. Clearly she does not meet discharge criteria for safe transition to home at this time.  She will remain in the hospital this weekend.  Chronic tracheostomy.   No apparent problems Hypertension.  Appears well controlled at present. 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  Pharmacy managing   @[facility] @  LOS: 2 days    Akisha Sturgill M 12/15/2016  . .prob

## 2016-12-16 LAB — PROTIME-INR
INR: 1.16
PROTHROMBIN TIME: 14.9 s (ref 11.4–15.2)

## 2016-12-16 NOTE — Progress Notes (Signed)
3 Days Post-Op  Subjective: Ambulating independently to bathroom.  Pain control reasonable.  Stable and alert. Continues to be concerned about all the details of her bandages and drains.  I discussed this with her at length.  Right sided drain output 270 mL. Left-sided drain output 1 10 mL  INR 1.16.  Hemoglobin 9.6.   Objective: Vital signs in last 24 hours: Temp:  [98.3 F (36.8 C)-98.7 F (37.1 C)] 98.3 F (36.8 C) (03/11 0618) Pulse Rate:  [67-86] 71 (03/11 0932) Resp:  [17-20] 18 (03/11 0932) BP: (141-164)/(61-75) 141/61 (03/11 0618) SpO2:  [92 %-98 %] 94 % (03/11 0932) Last BM Date: 12/15/16  Intake/Output from previous day: 03/10 0701 - 03/11 0700 In: 3976.3 [P.O.:840; I.V.:3136.3] Out: 380 [Drains:380] Intake/Output this shift: No intake/output data recorded.  General appearance: Alert.  No obvious distress.  I found her ambulating independently in her room.  Mental status normal. Chest wall: no tenderness, Port site right infraclavicular area looks fine.  Bilateral mastectomy incisions, skin flaps looked good.  No retained fluid.  No hematoma.  No skin necrosis.  Drainage serosanguineous.  Lab Results:  Results for orders placed or performed during the hospital encounter of 12/13/16 (from the past 24 hour(s))  Protime-INR     Status: None   Collection Time: 12/16/16  5:06 AM  Result Value Ref Range   Prothrombin Time 14.9 11.4 - 15.2 seconds   INR 1.16      Studies/Results: No results found.  . enoxaparin (LOVENOX) injection  40 mg Subcutaneous Q24H  . gabapentin  300 mg Oral TID  . hydrochlorothiazide  12.5 mg Oral QHS  . latanoprost  1 drop Both Eyes QHS  . losartan  100 mg Oral QHS  . warfarin  10 mg Oral Q48H  . warfarin  12 mg Oral Q48H  . Warfarin - Physician Dosing Inpatient   Does not apply q1800     Assessment/Plan: s/p Procedure(s): RIGHT MODIFIED RADICAL MASTECTOMY, LEFT SIMPLE MASTECTOMY  POD #2.  Right MRM and left simple  mastectomy Stable Advance diet and activities Ambulate in hall Dr. Ninfa Linden has recommended CIR or SNF since she lives alone Clinical social workers are working on placement. Clearly she does not meet discharge criteria for safe transition to home at this time.  She will remain in the hospital this weekend.  Chronic tracheostomy.  No apparent problems Hypertension.  Appears well controlled at present. 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  Pharmacy managing   @[facility] @  LOS: 3 days    Fate Caster M 12/16/2016  . .prob

## 2016-12-16 NOTE — Progress Notes (Signed)
Occupational Therapy Evaluation Patient Details Name: [name] MRN: [id number] DOB: 1950/10/18 Today's Date: 12/16/2016    History of Present Illness Pt is a 66 y/o female s/p R modified radical mastectomy and L simple mastectomy. PMH including but not limited to a-fib, CHF, CKD, HTN, hx of MI, hx of CVA and chronic tracheostomy.    Clinical Impression   PTA, pt lived alone and was independent with ADL and mobility. Pt ambulating @ modified independent level and returning to baseline with ADL. Pt states she has friends who can help out with IADL tasks while she is healing. Will follow acutely to educate pt on compensatory techniques for ADL. Feel pt will be appropriate to DC home with [place], [place] and [place]. Pt in agreement.     Follow Up Recommendations  Home health OT;Supervision - Intermittent    Equipment Recommendations  None recommended by OT    Recommendations for Other Services       Precautions / Restrictions Precautions Precautions: Other (comment) (B jp drains s/p mastectomy)      Mobility Bed Mobility Overal bed mobility: Modified Independent                Transfers Overall transfer level: Modified independent                    Balance Overall balance assessment: No apparent balance deficits (not formally assessed)                                          ADL Overall ADL's : Needs assistance/impaired     Grooming: Modified independent   Upper Body Bathing: Minimal assistance   Lower Body Bathing: Supervison/ safety;Set up;Sit to/from stand   Upper Body Dressing : Minimal assistance;Sitting   Lower Body Dressing: Set up;Supervision/safety;Sit to/from stand   Toilet Transfer: Modified Independent;Ambulation   Toileting- Clothing Manipulation and Hygiene: Modified independent       Functional mobility during ADLs: Modified independent General ADL Comments: Discussed managing B drains during  ambulation. Educated to not upll push or lift anything heavy. Pt verbalized understanding. Pt independently cares for her trach.      Vision         Perception     Praxis      Pertinent Vitals/Pain Pain Assessment: 0-10 Pain Score: 2  Pain Location: operative sites Pain Descriptors / Indicators: Discomfort Pain Intervention(s): Limited activity within patient's tolerance     Hand Dominance Right   Extremity/Trunk Assessment Upper Extremity Assessment Upper Extremity Assessment: Generalized weakness   Lower Extremity Assessment Lower Extremity Assessment: Overall WFL for tasks assessed   Cervical / Trunk Assessment Cervical / Trunk Assessment: Normal   Communication Communication Communication: Tracheostomy   Cognition Arousal/Alertness: Awake/alert Behavior During Therapy: WFL for tasks assessed/performed Overall Cognitive Status: Within Functional Limits for tasks assessed                     General Comments       Exercises       Shoulder Instructions      Home Living Family/patient expects to be discharged to:: Private residence Living Arrangements: Alone Available Help at Discharge: Friend(s);Available PRN/intermittently Type of Home: House Home Access: Ramped entrance     Home Layout: One level     Bathroom Shower/Tub: Occupational psychologist: Standard Bathroom Accessibility: Yes How  Accessible: Accessible via walker Home Equipment: Shower seat;Bedside commode;Hand held shower head          Prior Functioning/Environment Level of Independence: Independent        Comments: friends help with cleaning and errands        OT Problem List: Decreased range of motion;Decreased knowledge of use of DME or AE;Decreased knowledge of precautions;Obesity;Pain      OT Treatment/Interventions: Self-care/ADL training;DME and/or AE instruction;Therapeutic activities;Patient/family education    OT Goals(Current goals can be found in  the care plan section) Acute Rehab OT Goals Patient Stated Goal: to go home OT Goal Formulation: With patient Time For Goal Achievement: 12/30/16 Potential to Achieve Goals: Good  OT Frequency: Min 2X/week   Barriers to D/C:            Co-evaluation              End of Session Nurse Communication: Mobility status  Activity Tolerance: Patient tolerated treatment well Patient left: in bed;with call bell/phone within reach  OT Visit Diagnosis: Muscle weakness (generalized) (M62.81)                ADL either performed or assessed with clinical judgement  Time: 1455-1525 OT Time Calculation (min): 30 min Charges:  OT General Charges $OT Visit: 1 Procedure OT Evaluation $OT Eval Moderate Complexity: 1 Procedure OT Treatments $Self Care/Home Management : 8-22 mins G-Codes:     [facility], OT/L  [phone] 12/16/2016  [name],[name] 12/16/2016, 3:37 PM

## 2016-12-17 LAB — GLUCOSE, CAPILLARY: Glucose-Capillary: 91 mg/dL (ref 65–99)

## 2016-12-17 LAB — BASIC METABOLIC PANEL
Anion gap: 7 (ref 5–15)
BUN: 16 mg/dL (ref 6–20)
CO2: 25 mmol/L (ref 22–32)
CREATININE: 1.29 mg/dL — AB (ref 0.44–1.00)
Calcium: 9.2 mg/dL (ref 8.9–10.3)
Chloride: 107 mmol/L (ref 101–111)
GFR calc non Af Amer: 43 mL/min — ABNORMAL LOW (ref >60)
GFR, EST AFRICAN AMERICAN: 50 mL/min — AB (ref >60)
GLUCOSE: 107 mg/dL — AB (ref 65–99)
Potassium: 4.2 mmol/L (ref 3.5–5.1)
Sodium: 139 mmol/L (ref 135–145)

## 2016-12-17 LAB — PROTIME-INR
INR: 1.24
Prothrombin Time: 15.6 seconds — ABNORMAL HIGH (ref 11.4–15.2)

## 2016-12-17 MED ORDER — POLYETHYLENE GLYCOL 3350 17 G PO PACK
17.0000 g | PACK | Freq: Two times a day (BID) | ORAL | Status: DC | PRN
  Administered 2016-12-17: 17 g via ORAL
  Filled 2016-12-17: qty 1

## 2016-12-17 MED ORDER — BISACODYL 5 MG PO TBEC
5.0000 mg | DELAYED_RELEASE_TABLET | Freq: Every day | ORAL | Status: DC | PRN
  Administered 2016-12-17: 10 mg via ORAL
  Filled 2016-12-17 (×2): qty 2

## 2016-12-17 NOTE — Care Management Important Message (Signed)
Important Message  Patient Details  Name: [name] MRN: [id number] Date of Birth: [birth year]   Medicare Important Message Given:  Yes    Sylvana Bonk [address] 12/17/2016, 12:34 PM

## 2016-12-17 NOTE — Progress Notes (Signed)
Physical Therapy Treatment Patient Details Name: [name] MRN: [id number] DOB: 1950/11/15 Today's Date: 12/17/2016    History of Present Illness Pt is a 66 y/o female s/p R modified radical mastectomy and L simple mastectomy. PMH including but not limited to a-fib, CHF, CKD, HTN, hx of MI, hx of CVA and chronic tracheostomy.     PT Comments    Pt has progressed well and should now be safe and independent in her home environment.  Pt was safe and steady ambulating in the halls and negotiating 2 steps.  On return she took herself to the bathroom.  Pt stated she did not wish any therapy on return to home.    Follow Up Recommendations  No PT follow up (pt does not feel the need for any follow up.)     Equipment Recommendations  None recommended by PT    Recommendations for Other Services       Precautions / Restrictions Precautions Precautions:  (minimal fall risk) Precaution Comments: trach collar, 3 JP drains Restrictions Weight Bearing Restrictions: No    Mobility  Bed Mobility               General bed mobility comments: Pt sitting EOB   Transfers Overall transfer level: Independent Equipment used: None Transfers: Sit to/from Stand Sit to Stand: Independent            Ambulation/Gait Ambulation/Gait assistance: Supervision Ambulation Distance (Feet): 600 Feet Assistive device: None Gait Pattern/deviations: Step-through pattern Gait velocity: decreased Gait velocity interpretation: Below normal speed for age/gender General Gait Details: slow steady steps.  pt able to scan her environment as she walked.   Stairs Stairs: Yes   Stair Management: One rail Left;Step to pattern;Forwards Number of Stairs: 2 General stair comments: safe with rail, but limited only by lines  Wheelchair Mobility    Modified Rankin (Stroke Patients Only)       Balance Overall balance assessment: Needs assistance   Sitting balance-Leahy Scale: Good        Standing balance-Leahy Scale: Fair Standing balance comment: pt standing to talk without any support                    Cognition Arousal/Alertness: Awake/alert Behavior During Therapy: WFL for tasks assessed/performed Overall Cognitive Status: Within Functional Limits for tasks assessed                      Exercises      General Comments        Pertinent Vitals/Pain Pain Assessment: Faces Faces Pain Scale: No hurt    Home Living                      Prior Function            PT Goals (current goals can now be found in the care plan section) Acute Rehab PT Goals Patient Stated Goal: to go home PT Goal Formulation: With patient Time For Goal Achievement: 12/28/16 Potential to Achieve Goals: Good Progress towards PT goals: Progressing toward goals    Frequency    Min 3X/week      PT Plan Current plan remains appropriate    Co-evaluation             End of Session   Activity Tolerance: Patient tolerated treatment well Patient left: in bed;with call bell/phone within reach (sitting EOB) Nurse Communication: Mobility status;Other (comment) PT Visit Diagnosis: Other abnormalities of gait and mobility (  R26.89)     Time: 1610-1630 PT Time Calculation (min) (ACUTE ONLY): 20 min  Charges:  $Gait Training: 8-22 mins                    G CodesTessie Fass Keven Osborn 12/17/2016, 4:30 PM 12/17/2016  Donnella Sham, [place] [phone]  (pager)

## 2016-12-17 NOTE — Progress Notes (Signed)
4 Days Post-Op  Subjective: Complains of incisional pain Doing well dumping drains  Objective: Vital signs in last 24 hours: Temp:  [97.8 F (36.6 C)-98.6 F (37 C)] 98.6 F (37 C) (03/11 2146) Pulse Rate:  [61-74] 69 (03/12 0900) Resp:  [15-18] 18 (03/12 0900) BP: (148-150)/(67-68) 148/67 (03/11 2146) SpO2:  [94 %-97 %] 95 % (03/12 0900) Last BM Date: 12/15/16  Intake/Output from previous day: 03/11 0701 - 03/12 0700 In: 1497.5 [P.O.:1080; I.V.:417.5] Out: 465 [Drains:465] Intake/Output this shift: No intake/output data recorded.  Exam: Looks good Mastectomy flaps viable Drains serosang  Lab Results:   Recent Labs  12/14/16 1018 12/15/16 1053  WBC 7.4 5.7  HGB 10.5* 9.6*  HCT 34.4* 30.9*  PLT 220 190   BMET  Recent Labs  12/14/16 1018 12/17/16 0414  NA 138 139  K 4.4 4.2  CL 106 107  CO2 24 25  GLUCOSE 133* 107*  BUN 20 16  CREATININE 1.92* 1.29*  CALCIUM 8.9 9.2   PT/INR  Recent Labs  12/16/16 0506 12/17/16 0414  LABPROT 14.9 15.6*  INR 1.16 1.24   ABG No results for input(s): PHART, HCO3 in the last 72 hours.  Invalid input(s): PCO2, PO2  Studies/Results: No results found.  Anti-infectives: Anti-infectives    Start     Dose/Rate Route Frequency Ordered Stop   12/13/16 1230  ceFAZolin (ANCEF) 3 g in dextrose 5 % 50 mL IVPB  Status:  Discontinued     3 g 130 mL/hr over 30 Minutes Intravenous To Surgery 12/12/16 1006 12/13/16 1800      Assessment/Plan: s/p Procedure(s): RIGHT MODIFIED RADICAL MASTECTOMY, LEFT SIMPLE MASTECTOMY (Bilateral)  Chronic medical issues and trach Patient refused SNF/Rehab Would like home health instead.  Hopefully can be discharged Tuesday or Wednesday with home health  LOS: 4 days    Kalinda Romaniello A 12/17/2016

## 2016-12-17 NOTE — Progress Notes (Signed)
Occupational Therapy Treatment Patient Details Name: [name] MRN: [id number] DOB: [birth year] Today's Date: Jefferson    History of present illness Pt is a 66 y/o female s/p R modified radical mastectomy and L simple mastectomy. PMH including but not limited to a-fib, CHF, CKD, HTN, hx of MI, hx of CVA and chronic tracheostomy.    OT comments  Pt has made excellent progress toward goals.  She is now able to perform ADLs mod I, and functional transfers independently.  Reviewed safety and precautions with her re: activity, ROM,  And lifting precautions.  She was able to verbalize understanding of all.   She has met all goals, and no further OT recommended.  She agrees with goals met and that she no longer needs OT.  Will sign off at this time.   Follow Up Recommendations  No OT follow up;Supervision - Intermittent    Equipment Recommendations  3 in 1 bedside commode    Recommendations for Other Services      Precautions / Restrictions Precautions Precautions: Other (comment) Precaution Comments: trach collar, 3 JP drains       Mobility Bed Mobility               General bed mobility comments: Pt sitting EOB   Transfers Overall transfer level: Independent                    Balance Overall balance assessment: No apparent balance deficits (not formally assessed)                                 ADL Overall ADL's : Modified independent                                       General ADL Comments: Pt reports she did ADLs this am mod I.  Discussed precautions with her and she was able to verbalize understanding       Vision                     Perception     Praxis      Cognition   Behavior During Therapy: [facility] for tasks assessed/performed Overall Cognitive Status: Within Functional Limits for tasks assessed                         Exercises     Shoulder Instructions       General Comments       Pertinent Vitals/ Pain       Pain Assessment: Faces Faces Pain Scale: No hurt  Home Living                                          Prior Functioning/Environment              Frequency  Min 2X/week        Progress Toward Goals  OT Goals(current goals can now be found in the care plan section)  Progress towards OT goals: Goals met/education completed, patient discharged from [facility] Discharge plan needs to be updated    Co-evaluation  End of Session    OT Visit Diagnosis: Muscle weakness (generalized) (M62.81)   Activity Tolerance Patient tolerated treatment well   Patient Left in bed;with call bell/phone within reach (sitting EOB )   Nurse Communication Mobility status        Time: 6294-7654 OT Time Calculation (min): 42 min  Charges: OT General Charges $OT Visit: 1 Procedure OT Treatments $Self Care/Home Management : 23-37 mins $Therapeutic Activity: 8-22 mins  [facility], OTR/L [phone]    [name], [name]

## 2016-12-17 NOTE — Clinical Social Work Note (Signed)
CSW consulted for SNF placement. Per RNCM, pt refusing SNF and requesting PT home health. RNCM will continue to follow for d/c needs. CSW signing off as no further social work needs identified. Please reconsult if new SW needs arise.   Oretha Ellis, [place], [place] Work [phone]

## 2016-12-17 NOTE — Care Management Note (Addendum)
Case Management Note  Patient Details  Name: [name] MRN: [id number] Date of Birth: 1950/12/24  Subjective/Objective:                    Action/Plan: Patient has trach and receives her supplies through [facility]  Discussed discharge planning with patient. Patient aware PT recommendations is SNF . Patient refusing same requesting home health . Patient requesting walker and 3 in1 and wants DME delivered to home. Advised patient there may be a delivery charge.   Santiago Glad with [facility] will discuss co pays , insurance coverage and delivery of DME with patient.  Expected Discharge Date:                  Expected Discharge Plan:  [place]  In-House Referral:     Discharge planning Services  CM Consult  Post Acute Care Choice:  Durable Medical Equipment, Home Health Choice offered to:  Patient  DME Arranged:  3-N-1, Walker rolling DME Agency:  [place]:  RN, PT, OT, Nurse's Aide [place] Agency:  [place]  Status of Service:  In process, will continue to follow  If discussed at Long Length of Stay Meetings, dates discussed:    Additional Comments:  [name], RN 12/17/2016, 11:39 AM

## 2016-12-17 NOTE — Discharge Instructions (Addendum)
Information on my medicine - Coumadin®   (Warfarin) ° °This medication education was reviewed with me or my healthcare representative as part of my discharge preparation.  The pharmacist that spoke with me during my hospital stay was:  [name], [name], RPH ° °Why was Coumadin prescribed for you? °Coumadin was prescribed for you because you have a blood clot or a medical condition that can cause an increased risk of forming blood clots. Blood clots can cause serious health problems by blocking the flow of blood to the heart, lung, or brain. Coumadin can prevent harmful blood clots from forming. °As a reminder your indication for Coumadin is:   Stroke Prevention Because Of Atrial Fibrillation ° °What test will check on my response to Coumadin? °While on Coumadin (warfarin) you will need to have an INR test regularly to ensure that your dose is keeping you in the desired range. The INR (international normalized ratio) number is calculated from the result of the laboratory test called prothrombin time (PT). ° °If an INR APPOINTMENT HAS NOT ALREADY BEEN MADE FOR YOU please schedule an appointment to have this lab work done by your health care provider within 7 days. °Your INR goal is usually a number between:  2 to 3 or your provider may give you a more narrow range like 2-2.5.  Ask your health care provider during an office visit what your goal INR is. ° °What  do you need to  know  About  COUMADIN? °Take Coumadin (warfarin) exactly as prescribed by your healthcare provider about the same time each day.  DO NOT stop taking without talking to the doctor who prescribed the medication.  Stopping without other blood clot prevention medication to take the place of Coumadin may increase your risk of developing a new clot or stroke.  Get refills before you run out. ° °What do you do if you miss a dose? °If you miss a dose, take it as soon as you remember on the same day then continue your regularly scheduled regimen the next  day.  Do not take two doses of Coumadin at the same time. ° °Important Safety Information °A possible side effect of Coumadin (Warfarin) is an increased risk of bleeding. You should call your healthcare provider right away if you experience any of the following: °? Bleeding from an injury or your nose that does not stop. °? Unusual colored urine (red or dark brown) or unusual colored stools (red or black). °? Unusual bruising for unknown reasons. °? A serious fall or if you hit your head (even if there is no bleeding). ° °Some foods or medicines interact with Coumadin® (warfarin) and might alter your response to warfarin. To help avoid this: °? Eat a balanced diet, maintaining a consistent amount of Vitamin K. °? Notify your provider about major diet changes you plan to make. °? Avoid alcohol or limit your intake to 1 drink for women and 2 drinks for men per day. °(1 drink is 5 oz. wine, 12 oz. beer, or 1.5 oz. liquor.) ° °Make sure that ANY health care provider who prescribes medication for you knows that you are taking Coumadin (warfarin).  Also make sure the healthcare provider who is monitoring your Coumadin knows when you have started a new medication including herbals and non-prescription products. ° °Coumadin® (Warfarin)  Major Drug Interactions  °Increased Warfarin Effect Decreased Warfarin Effect  °Alcohol (large quantities) °Antibiotics (esp. Septra/Bactrim, Flagyl, Cipro) °Amiodarone (Cordarone) °Aspirin (ASA) °Cimetidine (Tagamet) °Megestrol (Megace) °NSAIDs (ibuprofen,   naproxen, etc.) Piroxicam (Feldene) Propafenone (Rythmol SR) Propranolol (Inderal) Isoniazid (INH) Posaconazole (Noxafil) Barbiturates (Phenobarbital) Carbamazepine (Tegretol) Chlordiazepoxide (Librium) Cholestyramine (Questran) Griseofulvin Oral Contraceptives Rifampin Sucralfate (Carafate) Vitamin K   Coumadin (Warfarin) Major Herbal Interactions  Increased Warfarin Effect Decreased Warfarin Effect   Garlic Ginseng Ginkgo biloba Coenzyme Q10 Green tea St. Johns wort    Coumadin (Warfarin) FOOD Interactions  Eat a consistent number of servings per week of foods HIGH in Vitamin K (1 serving =  cup)  Collards (cooked, or boiled & drained) Kale (cooked, or boiled & drained) Mustard greens (cooked, or boiled & drained) Parsley *serving size only =  cup Spinach (cooked, or boiled & drained) Swiss chard (cooked, or boiled & drained) Turnip greens (cooked, or boiled & drained)  Eat a consistent number of servings per week of foods MEDIUM-HIGH in Vitamin K (1 serving = 1 cup)  Asparagus (cooked, or boiled & drained) Broccoli (cooked, boiled & drained, or raw & chopped) Brussel sprouts (cooked, or boiled & drained) *serving size only =  cup Lettuce, raw (green leaf, endive, romaine) Spinach, raw Turnip greens, raw & chopped   These websites have more information on Coumadin (warfarin):  FailFactory.se; VeganReport.com.au;  CC[facility]___[facility] [phone]  MASTECTOMY: POST OP INSTRUCTIONS  Always review your discharge instruction sheet given to you by the facility where your surgery was performed. IF YOU HAVE DISABILITY OR FAMILY LEAVE FORMS, YOU MUST BRING THEM TO THE OFFICE FOR PROCESSING.   DO NOT GIVE THEM TO YOUR DOCTOR. A prescription for pain medication may be given to you upon discharge.  Take your pain medication as prescribed, if needed.  If narcotic pain medicine is not needed, then you may take acetaminophen (Tylenol) or ibuprofen (Advil) as needed. 1. Take your usually prescribed medications unless otherwise directed. 2. If you need a refill on your pain medication, please contact your pharmacy.  They will contact our office to request authorization.  Prescriptions will not be filled after 5pm or on week-ends. 3. You should follow a light diet the first few days after arrival home, such as soup and crackers, etc.  Resume your  normal diet the day after surgery. 4. Most patients will experience some swelling and bruising on the chest and underarm.  Ice packs will help.  Swelling and bruising can take several days to resolve.  5. It is common to experience some constipation if taking pain medication after surgery.  Increasing fluid intake and taking a stool softener (such as Colace) will usually help or prevent this problem from occurring.  A mild laxative (Milk of Magnesia or Miralax) should be taken according to package instructions if there are no bowel movements after 48 hours. 6. Unless discharge instructions indicate otherwise, leave your bandage dry and in place until your next appointment in 3-5 days.  You may take a limited sponge bath.  No tube baths or showers until the drains are removed.  You may have steri-strips (small skin tapes) in place directly over the incision.  These strips should be left on the skin for 7-10 days.  If your surgeon used skin glue on the incision, you may shower in 24 hours.  The glue will flake off over the next 2-3 weeks.  Any sutures or staples will be removed at the office during your follow-up visit. 7. DRAINS:  If you have drains in place, it is important to keep a list of the amount of drainage produced each day in your drains.  Before leaving  the hospital, you should be instructed on drain care.  Call our office if you have any questions about your drains. 8. ACTIVITIES:  You may resume regular (light) daily activities beginning the next day--such as daily self-care, walking, climbing stairs--gradually increasing activities as tolerated.  You may have sexual intercourse when it is comfortable.  Refrain from any heavy lifting or straining until approved by your doctor. a. You may drive when you are no longer taking prescription pain medication, you can comfortably wear a seatbelt, and you can safely maneuver your car and apply brakes. b. RETURN TO WORK:   __________________________________________________________ 9. You should see your doctor in the office for a follow-up appointment approximately 3-5 days after your surgery.  Your doctors nurse will typically make your follow-up appointment when she calls you with your pathology report.  Expect your pathology report 2-3 business days after your surgery.  You may call to check if you do not hear from [place] after three days.   10. OTHER INSTRUCTIONS: ______________________________________________________________________________________________ ____________________________________________________________________________________________ WHEN TO CALL YOUR DOCTOR: 1. Fever over 101.0 2. Nausea and/or vomiting 3. Extreme swelling or bruising 4. Continued bleeding from incision. 5. Increased pain, redness, or drainage from the incision. The clinic staff is available to answer your questions during regular business hours.  Please dont hesitate to call and ask to speak to one of the nurses for clinical concerns.  If you have a medical emergency, go to the nearest emergency room or call 911.  A surgeon from [facility] is always on call at the hospital. [address] [place], [place], [place]  [address] ? [address] [place], [place], [place]   [address] [phone] ? [phone] ? FAX [phone] Web site: [email or web address]

## 2016-12-18 LAB — PROTIME-INR
INR: 1.43
Prothrombin Time: 17.6 seconds — ABNORMAL HIGH (ref 11.4–15.2)

## 2016-12-18 MED ORDER — OXYCODONE HCL 5 MG PO TABS: 5.0000 mg | ORAL_TABLET | ORAL | 0 refills | Status: DC | PRN

## 2016-12-18 NOTE — Care Management Note (Signed)
Case Management Note  Patient Details  Name: Eleftheria Taborn MRN: [id number] Date of Birth: [birth year]  Subjective/Objective:                    Action/Plan:   Expected Discharge Date:  12/18/16               Expected Discharge Plan:  [place]  In-House Referral:     Discharge planning Services  CM Consult  Post Acute Care Choice:  Durable Medical Equipment, Home Health Choice offered to:  Patient  DME Arranged:  3-N-1, Walker rolling DME Agency:  [place]:  RN, PT, OT, Nurse's Aide [place] Agency:  [place]  Status of Service:  Completed, signed off  If discussed at [place] of Stay Meetings, dates discussed:    Additional Comments:  [name], RN 12/18/2016, 9:29 AM

## 2016-12-18 NOTE — Progress Notes (Signed)
5 Days Post-Op  Subjective: Patient wants to go home today. She is ambulating and taking care of her drains  Objective: Vital signs in last 24 hours: Temp:  [97.6 F (36.4 C)-98.7 F (37.1 C)] 98.7 F (37.1 C) (03/13 0548) Pulse Rate:  [59-74] 59 (03/13 0640) Resp:  [16-18] 18 (03/13 0548) BP: (155-185)/(71-84) 162/84 (03/13 0640) SpO2:  [95 %-98 %] 98 % (03/13 0548) Last BM Date: 12/15/16  Intake/Output from previous day: 03/12 0701 - 03/13 0700 In: 640 [P.O.:240; I.V.:400] Out: 410 [Drains:410] Intake/Output this shift: No intake/output data recorded.  Exam: Lungs clear CV RRR Abdomen soft Mastectomy incisions clean Drains serosang  Lab Results:   Recent Labs  12/15/16 1053  WBC 5.7  HGB 9.6*  HCT 30.9*  PLT 190   BMET  Recent Labs  12/17/16 0414  NA 139  K 4.2  CL 107  CO2 25  GLUCOSE 107*  BUN 16  CREATININE 1.29*  CALCIUM 9.2   PT/INR  Recent Labs  12/16/16 0506 12/17/16 0414  LABPROT 14.9 15.6*  INR 1.16 1.24   ABG No results for input(s): PHART, HCO3 in the last 72 hours.  Invalid input(s): PCO2, PO2  Studies/Results: No results found.  Anti-infectives: Anti-infectives    Start     Dose/Rate Route Frequency Ordered Stop   12/13/16 1230  ceFAZolin (ANCEF) 3 g in dextrose 5 % 50 mL IVPB  Status:  Discontinued     3 g 130 mL/hr over 30 Minutes Intravenous To Surgery 12/12/16 1006 12/13/16 1800      Assessment/Plan: s/p Procedure(s): RIGHT MODIFIED RADICAL MASTECTOMY, LEFT SIMPLE MASTECTOMY (Bilateral)  Pt really wants to go home with home health which is totally reasonable give PT/OT evaluation. She has been doing well.  Will discharge home today  LOS: 5 days    Larrisa Cravey A 12/18/2016

## 2016-12-18 NOTE — Discharge Summary (Signed)
Physician Discharge Summary  Patient ID: [name] MRN: [id number] DOB/AGE: Oct 14, 1950 66 y.o.  Admit date: 12/13/2016 Discharge date: 12/18/2016  Admission Diagnoses:  Discharge Diagnoses:  Active Problems:   Breast cancer [facility]) chronic tracheostomy  Discharged Condition: good  Hospital Course: uneventful post op recovery.  Coumadin started post op.  Patient worked with PT and OT.  She refused SNF/rehab.  She did well with drain care.  Consults: None  Significant Diagnostic Studies:   Treatments: surgery: right modified radical mastectomy, left simple mastectomy  Discharge Exam: Blood pressure (!) 162/84, pulse (!) 59, temperature 98.7 F (37.1 C), temperature source Oral, resp. rate 20, height 5' 4.5" (1.638 m), weight 110.7 kg (244 lb), SpO2 98 %. General appearance: alert, cooperative and no distress Resp: clear to auscultation bilaterally Cardio: regular rate and rhythm, S1, S2 normal, no murmur, click, rub or gallop Incision/Wound:mastectomy incisions clean, drains serosang, flaps viable Trach working well  Disposition: 01-Home or Self Care  Discharge Instructions    Discharge patient    Complete by:  As directed    Make sure home health arranged for nurses, drain care, possible sitter, walker, PT   Discharge disposition:  01-Home or Self Care   Discharge patient date:  12/18/2016   [place] Rile standard    Complete by:  As directed      Allergies as of 12/18/2016      Reactions   Lisinopril Cough   Eggs Or Egg-derived Products Rash   Raw eggs ONLY (boiled, scrambled, etc)  If baked into something it is NOT a issue      Medication List    STOP taking these medications   enoxaparin 100 MG/ML injection Commonly known as:  LOVENOX     TAKE these medications   cholecalciferol 1000 units tablet Commonly known as:  VITAMIN D Take 1,000 Units by mouth daily.   gabapentin 300 MG capsule Commonly known as:  NEURONTIN TAKE 1 CAPSULE (300 MG TOTAL) BY  MOUTH 3 (THREE) TIMES DAILY.   hydrochlorothiazide 12.5 MG tablet Commonly known as:  HYDRODIURIL Take 1 tablet (12.5 mg total) by mouth daily. What changed:  when to take this   latanoprost 0.005 % ophthalmic solution Commonly known as:  XALATAN Place 1 drop into both eyes at bedtime.   lidocaine-prilocaine cream Commonly known as:  EMLA Apply to affected area once What changed:  how much to take  how to take this  when to take this  reasons to take this  additional instructions   losartan 100 MG tablet Commonly known as:  COZAAR Take 1 tablet (100 mg total) by mouth daily. What changed:  when to take this   ondansetron 8 MG tablet Commonly known as:  ZOFRAN Take 1 tablet (8 mg total) by mouth 2 (two) times daily as needed for refractory nausea / vomiting. Start on day 3 after chemo.   oxyCODONE 5 MG immediate release tablet Commonly known as:  Oxy IR/ROXICODONE Take 1-2 tablets (5-10 mg total) by mouth every 4 (four) hours as needed for moderate pain.   pantoprazole 40 MG tablet Commonly known as:  PROTONIX Take 1 tablet (40 mg total) by mouth daily as needed. Caution:prolonged use may increase risk of pneumonia, colitis, osteoporosis, anemia What changed:  reasons to take this  additional instructions   potassium chloride 10 MEQ tablet Commonly known as:  KLOR-CON M10 Take 1 tablet (10 mEq total) by mouth 3 (three) times daily.   prochlorperazine 10 MG tablet Commonly known as:  COMPAZINE Take 1 tablet (10 mg total) by mouth every 6 (six) hours as needed for nausea or vomiting.   promethazine 12.5 MG tablet Commonly known as:  PHENERGAN Take 12.5 mg by mouth every 6 (six) hours as needed for nausea or vomiting.   tizanidine 2 MG capsule Commonly known as:  ZANAFLEX Take 2 capsules (4 mg total) by mouth 3 (three) times daily as needed for muscle spasms.   triamcinolone cream 0.1 % Commonly known as:  KENALOG Apply 1 application topically 2 (two)  times daily. If needed What changed:  when to take this  reasons to take this  additional instructions   warfarin 4 MG tablet Commonly known as:  COUMADIN Take 3 tablets (12 mg total) by mouth daily. Or as otherwise directed by your doctor What changed:  how much to take  when to take this  additional instructions      Follow-up Information    Oviya Ammar A, MD Follow up on 12/24/2016.   Specialty:  General Surgery Contact information: [address] [place] [address] [phone]           Signed: Harl Bowie 12/18/2016, 9:22 AM

## 2016-12-18 NOTE — Progress Notes (Signed)
Pt is anxious to go home. Denies pain. Pt is independently emptying JP drains, JP drain care teachings done.  Discharge instructions given, pt verbalized understanding. Discharged to home transported by a friend. A walker and a bedside commode was delivered and given to pt.

## 2016-12-18 NOTE — Progress Notes (Signed)
Pt is up & walking around room.  No distress noted, pt states she "feel fine", per pt: she takes care of her trach needs.  Pt did not have inner cannula in d/t pt states she is "soaking" her inner cannula.  BBSH clear diminished.  Pt states she does not need any assistance from RT currently.

## 2016-12-20 DIAGNOSIS — I2510 Atherosclerotic heart disease of native coronary artery without angina pectoris: Secondary | ICD-10-CM | POA: Diagnosis not present

## 2016-12-20 DIAGNOSIS — Z8673 Personal history of transient ischemic attack (TIA), and cerebral infarction without residual deficits: Secondary | ICD-10-CM | POA: Diagnosis not present

## 2016-12-20 DIAGNOSIS — N62 Hypertrophy of breast: Secondary | ICD-10-CM | POA: Diagnosis not present

## 2016-12-20 DIAGNOSIS — Z950 Presence of cardiac pacemaker: Secondary | ICD-10-CM | POA: Diagnosis not present

## 2016-12-20 DIAGNOSIS — Z930 Tracheostomy status: Secondary | ICD-10-CM | POA: Diagnosis not present

## 2016-12-20 DIAGNOSIS — Z7901 Long term (current) use of anticoagulants: Secondary | ICD-10-CM | POA: Diagnosis not present

## 2016-12-20 DIAGNOSIS — I509 Heart failure, unspecified: Secondary | ICD-10-CM | POA: Diagnosis not present

## 2016-12-20 DIAGNOSIS — Z9013 Acquired absence of bilateral breasts and nipples: Secondary | ICD-10-CM | POA: Diagnosis not present

## 2016-12-20 DIAGNOSIS — C50911 Malignant neoplasm of unspecified site of right female breast: Secondary | ICD-10-CM | POA: Diagnosis not present

## 2016-12-20 DIAGNOSIS — I130 Hypertensive heart and chronic kidney disease with heart failure and stage 1 through stage 4 chronic kidney disease, or unspecified chronic kidney disease: Secondary | ICD-10-CM | POA: Diagnosis not present

## 2016-12-20 DIAGNOSIS — I482 Chronic atrial fibrillation: Secondary | ICD-10-CM | POA: Diagnosis not present

## 2016-12-20 DIAGNOSIS — N183 Chronic kidney disease, stage 3 (moderate): Secondary | ICD-10-CM | POA: Diagnosis not present

## 2016-12-20 DIAGNOSIS — G8921 Chronic pain due to trauma: Secondary | ICD-10-CM | POA: Diagnosis not present

## 2016-12-20 DIAGNOSIS — Z483 Aftercare following surgery for neoplasm: Secondary | ICD-10-CM | POA: Diagnosis not present

## 2016-12-21 DIAGNOSIS — I482 Chronic atrial fibrillation: Secondary | ICD-10-CM | POA: Diagnosis not present

## 2016-12-21 LAB — PROTIME-INR: INR: 1.9 — AB (ref 0.9–1.1)

## 2016-12-24 ENCOUNTER — Ambulatory Visit (HOSPITAL_BASED_OUTPATIENT_CLINIC_OR_DEPARTMENT_OTHER): Payer: PPO | Admitting: Hematology and Oncology

## 2016-12-24 ENCOUNTER — Other Ambulatory Visit: Payer: Self-pay

## 2016-12-24 ENCOUNTER — Encounter: Payer: Self-pay | Admitting: Genetics

## 2016-12-24 ENCOUNTER — Encounter: Payer: Self-pay | Admitting: Family Medicine

## 2016-12-24 DIAGNOSIS — C50211 Malignant neoplasm of upper-inner quadrant of right female breast: Secondary | ICD-10-CM

## 2016-12-24 DIAGNOSIS — C7951 Secondary malignant neoplasm of bone: Secondary | ICD-10-CM

## 2016-12-24 DIAGNOSIS — Z170 Estrogen receptor positive status [ER+]: Secondary | ICD-10-CM

## 2016-12-24 MED ORDER — LETROZOLE 2.5 MG PO TABS: 2.5000 mg | ORAL_TABLET | Freq: Every day | ORAL | 3 refills | Status: AC

## 2016-12-24 MED ORDER — WARFARIN SODIUM 4 MG PO TABS: 12.0000 mg | ORAL_TABLET | Freq: Every day | ORAL | 1 refills | Status: DC

## 2016-12-24 NOTE — Telephone Encounter (Signed)
Please contact patient Let her know we're thinking about her, hope she is healing up Med list says she is alternating 10 mg with 12 mg every other day Current INR is 1.9 (on Friday) Confirm 10 mg and 12 mg alternating, and if so, then increase slightly to: 12 mg on Mon-Wed-Thurs-Sat-Sun 10 mg on Tuesdays and Fridays Recheck INR in 1 week

## 2016-12-24 NOTE — Assessment & Plan Note (Signed)
Right mastectomy 12/13/2016: Multifocal tumor largest 2.3 cm, remaining 6 foci 0.3-1.2 centimeters, IDC with papillary features and smaller foci of IDC with lobular features grade 2, 19/20 lymph nodes positive, ER 100%, PR 100%, HER-2 negative, Ki-67 15%; repeat testing on the final path EF 30%, PR 0%, HER-2 negative ratio 1.67, Ki-67 20% RCB class 3 T2N3a Stage 3C  07/16/2016: PET/CT scan: Right breast inflammatory breast cancer, T7 osseous metastases, indeterminate focus medial left breast corresponding to 7 mm calcified soft tissue nodule, right adrenal adenoma  Clinical stage: T4 N1M1stage IV  Treatment summary: 1. Neoadjuvant chemotherapy with TaxotereCytoxan 4 (because patient has history of cardiac problems and has a pacemaker); 07/19/2016 to 09/23/2016 2. Rt mastectomy with axillary node dissection 12/13/2016 3. Followed by adjuvant radiation including radiation to T7 vertebral body 4. Followed by antiestrogen therapy with anastrozole 10 years -----------------------------------------------------------------------------------------------------------------------------------------Goal of treatment: Although cure is not possible for stage IV breast cancer, we are treating her with a curative intent to cause she only has a solitary site of metastatic disease.  Pathology review: I discussed the final pathology report of the patient and provided her with a copy of this report. Patient had protracted recovery from chemotherapy with lots of toxicities from the prior treatment. She had received blood transfusions.

## 2016-12-24 NOTE — Progress Notes (Signed)
[name], MD as PCP - General (Family Medicine) [name], MD as Consulting Physician (Cardiology)  DIAGNOSIS:  Encounter Diagnosis  Name Primary?  . Malignant neoplasm of upper-inner quadrant of right breast in female, estrogen receptor positive ([place])     SUMMARY OF ONCOLOGIC HISTORY:   Breast cancer of upper-inner quadrant of right female breast ([place])   06/15/2016 Mammogram    Right breast irregular mass UIQ with diffuse skin thickening that is palpable, additional pleomorphic calcifications 2.8 x 2.1 x 3.5 cm extending posteriorly for another 2 cm; left breast 8 mm circumscribed nodule       06/21/2016 Initial Diagnosis    Right breast biopsy UIQ: IDC grade 2 with papillary features ER 100%, PR 100%, Ki-67 15%, HER-2 negative ratio 1.21; right axillary LN biopsy metastatic carcinoma in one lymph node with ECE, ER 45%, PR 0%, Ki-67 20%, HER-2 negative ratio 1.86      06/28/2016 Procedure    Left breast biopsy: Fibroadenoma      07/16/2016 PET scan    Right breast primary inflammatory breast cancer, right axillary lymph node metastases, T7 bone metastases, indeterminate focus in the left medial breast 7 mm, right adrenal nodule      07/19/2016 - 09/20/2016 Neo-Adjuvant Chemotherapy    Neoadjuvant Taxotere and Cytoxan every 3 weeks 4 cycles      12/13/2016 Surgery    Right mastectomy: Multifocal tumor largest 2.3 cm, remaining 6 foci 0.3-1.2 centimeters, IDC with papillary features and smaller foci of IDC with lobular features grade 2, 19/20 lymph nodes positive, ER 100%, PR 100%, HER-2 negative, Ki-67 15%; repeat testing on the final path EF 30%, PR 0%, HER-2 negative ratio 1.67, Ki-67 20% RCB class 3 T2N3a Stage 3C       CHIEF COMPLIANT:  Follow-up after recent mastectomy  INTERVAL HISTORY: [name] is a 66 year old lady with above-mentioned history of solitary bone metastatic breast cancer who had completed neoadjuvant chemotherapy  with 4 cycles of Taxotere and Cytoxan with great difficulty. She had a recent bil mastectomy on 12/13/2016 along with axillary lymph node dissection. She is recovering from the surgery. She was found to have extensive nodal involvement with 19 positive lymph nodes out of 20. Overall she is improving gradually with time. She tells me that she does not wish to undergo radiation. She is very disappointed that the chemotherapy did not shrink her cancer significantly. She was inquiring if clinical trials that she could be eligible to participate in.  REVIEW OF SYSTEMS:   Constitutional: Denies fevers, chills or abnormal weight loss Eyes: Denies blurriness of vision Ears, nose, mouth, throat, and face: Denies mucositis or sore throat Respiratory: Denies cough, dyspnea or wheezes Cardiovascular: Denies palpitation, chest discomfort Gastrointestinal:  Denies nausea, heartburn or change in bowel habits Skin: Denies abnormal skin rashes Lymphatics: Denies new lymphadenopathy or easy bruising Neurological:Denies numbness, tingling or new weaknesses Behavioral/Psych: Mood is stable, no new changes  Extremities: No lower extremity edema Breast: Bil mastectomy All other systems were reviewed with the patient and are negative.  I have reviewed the past medical history, past surgical history, social history and family history with the patient and they are unchanged from previous note.  ALLERGIES:  is allergic to lisinopril and eggs or egg-derived products.  MEDICATIONS:  Current Outpatient Prescriptions  Medication Sig Dispense Refill  . cholecalciferol (VITAMIN D) 1000 UNITS tablet Take 1,000 Units by mouth daily.    Marland Kitchen gabapentin (NEURONTIN) 300 MG capsule TAKE  1 CAPSULE (300 MG TOTAL) BY MOUTH 3 (THREE) TIMES DAILY. 90 capsule 5  . hydrochlorothiazide (HYDRODIURIL) 12.5 MG tablet Take 1 tablet (12.5 mg total) by mouth daily. (Patient taking differently: Take 12.5 mg by mouth at bedtime. ) 90 tablet 1    . latanoprost (XALATAN) 0.005 % ophthalmic solution Place 1 drop into both eyes at bedtime.     Marland Kitchen letrozole (FEMARA) 2.5 MG tablet Take 1 tablet (2.5 mg total) by mouth daily. 90 tablet 3  . lidocaine-prilocaine (EMLA) cream Apply to affected area once (Patient taking differently: Apply 1 application topically daily as needed (prior to port access). Apply to affected area once) 30 g 3  . losartan (COZAAR) 100 MG tablet Take 1 tablet (100 mg total) by mouth daily. (Patient taking differently: Take 100 mg by mouth at bedtime. ) 30 tablet 5  . ondansetron (ZOFRAN) 8 MG tablet Take 1 tablet (8 mg total) by mouth 2 (two) times daily as needed for refractory nausea / vomiting. Start on day 3 after chemo. 30 tablet 1  . oxyCODONE (OXY IR/ROXICODONE) 5 MG immediate release tablet Take 1-2 tablets (5-10 mg total) by mouth every 4 (four) hours as needed for moderate pain. 40 tablet 0  . pantoprazole (PROTONIX) 40 MG tablet Take 1 tablet (40 mg total) by mouth daily as needed. Caution:prolonged use may increase risk of pneumonia, colitis, osteoporosis, anemia (Patient taking differently: Take 40 mg by mouth daily as needed (for acid reflux/gerd). Caution:prolonged use may increase risk of pneumonia, colitis, osteoporosis, anemia) 90 tablet 0  . potassium chloride (KLOR-CON M10) 10 MEQ tablet Take 1 tablet (10 mEq total) by mouth 3 (three) times daily. 270 tablet 1  . prochlorperazine (COMPAZINE) 10 MG tablet Take 1 tablet (10 mg total) by mouth every 6 (six) hours as needed for nausea or vomiting. 30 tablet 3  . promethazine (PHENERGAN) 12.5 MG tablet Take 12.5 mg by mouth every 6 (six) hours as needed for nausea or vomiting.    . tizanidine (ZANAFLEX) 2 MG capsule Take 2 capsules (4 mg total) by mouth 3 (three) times daily as needed for muscle spasms. 30 capsule 1  . triamcinolone cream (KENALOG) 0.1 % Apply 1 application topically 2 (two) times daily. If needed (Patient taking differently: Apply 1 application  topically 2 (two) times daily as needed (for dry/itchy skin or rash.). If needed) 45 g 1  . warfarin (COUMADIN) 4 MG tablet Take 3 tablets (12 mg total) by mouth daily. Or as otherwise directed by your doctor 270 tablet 1   No current facility-administered medications for this visit.     PHYSICAL EXAMINATION: ECOG PERFORMANCE STATUS: 2 - Symptomatic, <50% confined to bed  Vitals:   12/24/16 1219  BP: (!) 152/76  Pulse: 88  Resp: 20  Temp: 97.5 F (36.4 C)   Filed Weights   12/24/16 1219  Weight: 241 lb 11.2 oz (109.6 kg)    GENERAL:alert, no distress and comfortable SKIN: skin color, texture, turgor are normal, no rashes or significant lesions EYES: normal, Conjunctiva are pink and non-injected, sclera clear OROPHARYNX:no exudate, no erythema and lips, buccal mucosa, and tongue normal  NECK: supple, thyroid normal size, non-tender, without nodularity LYMPH:  no palpable lymphadenopathy in the cervical, axillary or inguinal LUNGS: clear to auscultation and percussion with normal breathing effort HEART: regular rate & rhythm and no murmurs and no lower extremity edema ABDOMEN:abdomen soft, non-tender and normal bowel sounds MUSCULOSKELETAL:no cyanosis of digits and no clubbing  NEURO: alert &  oriented x 3 with fluent speech, no focal motor/sensory deficits EXTREMITIES: No lower extremity edema  LABORATORY DATA:  I have reviewed the data as listed   Chemistry      Component Value Date/Time   NA 139 12/17/2016 0414   NA 141 10/05/2016 1158   K 4.2 12/17/2016 0414   K 3.9 10/05/2016 1158   CL 107 12/17/2016 0414   CO2 25 12/17/2016 0414   CO2 24 10/05/2016 1158   BUN 16 12/17/2016 0414   BUN 18.2 10/05/2016 1158   CREATININE 1.29 (H) 12/17/2016 0414   CREATININE 1.3 (H) 10/05/2016 1158   GLU 98 11/26/2014      Component Value Date/Time   CALCIUM 9.2 12/17/2016 0414   CALCIUM 9.6 10/05/2016 1158   ALKPHOS 87 10/05/2016 1158   AST 18 10/05/2016 1158   ALT 13  10/05/2016 1158   BILITOT 0.30 10/05/2016 1158       Lab Results  Component Value Date   WBC 5.7 12/15/2016   HGB 9.6 (L) 12/15/2016   HCT 30.9 (L) 12/15/2016   MCV 89.6 12/15/2016   PLT 190 12/15/2016   NEUTROABS 6.1 10/05/2016    ASSESSMENT & PLAN:  Breast cancer of upper-inner quadrant of right female breast ([place]) Right mastectomy 12/13/2016: Multifocal tumor largest 2.3 cm, remaining 6 foci 0.3-1.2 centimeters, IDC with papillary features and smaller foci of IDC with lobular features grade 2, 19/20 lymph nodes positive, ER 100%, PR 100%, HER-2 negative, Ki-67 15%; repeat testing on the final path EF 30%, PR 0%, HER-2 negative ratio 1.67, Ki-67 20% RCB class 3 T2N3a Stage 3C  07/16/2016: PET/CT scan: Right breast inflammatory breast cancer, T7 osseous metastases, indeterminate focus medial left breast corresponding to 7 mm calcified soft tissue nodule, right adrenal adenoma  Clinical stage: T4 N1M1stage IV  Treatment summary: 1. Neoadjuvant chemotherapy with TaxotereCytoxan 4 (because patient has history of cardiac problems and has a pacemaker); 07/19/2016 to 09/23/2016 2. Rt mastectomy with axillary node dissection 12/13/2016 3. Followed by adjuvant radiation including radiation to T7 vertebral body 4. Followed by antiestrogen therapy with anastrozole 10 years ----------------------------------------------------------------------------------------------------------------------------------------- Goal of treatment: Although cure is not possible for stage IV breast cancer, we are treating her with a curative intent to cause she only has a solitary site of metastatic disease. Pathology review: I discussed the final pathology report of the patient and provided her with a copy of this report. Patient had protracted recovery from chemotherapy with lots of toxicities from the prior treatment. She had received blood transfusions.  Treatment plan: 1. Patient is not keen on  receiving adjuvant radiation. 2. We will plan for a restaging PET CT scan 3. If she does not have widespread metastatic disease, then we will treat her with letrozole 2.5 mg daily. 4. If she does have significant metastatic disease, then we will plan to add Palbociclib to her treatment.  Letrozole counseling:We discussed the risks and benefits of anti-estrogen therapy with aromatase inhibitors. These include but not limited to insomnia, hot flashes, mood changes, vaginal dryness, bone density loss, and weight gain. We strongly believe that the benefits far outweigh the risks. Patient understands these risks and consented to starting treatment. I will see the patient back after the PET/CT scan to discuss the results.   I spent 25 minutes talking to the patient of which more than half was spent in counseling and coordination of care.  Orders Placed This Encounter  Procedures  . NM PET Image Restag (PS) Skull Base To  Thigh    Standing Status:   Future    Standing Expiration Date:   12/24/2017    Order Specific Question:   Reason for Exam (SYMPTOM  OR DIAGNOSIS REQUIRED)    Answer:   Metastatic breast cancer restaging    Order Specific Question:   If indicated for the ordered procedure, I authorize the administration of a radiopharmaceutical per Radiology protocol    Answer:   Yes    Order Specific Question:   Preferred imaging location?    Answer:   [facility]    Order Specific Question:   Radiology Contrast Protocol - do NOT remove file path    Answer:   [email or web address]   The patient has a good understanding of the overall plan. she agrees with it. she will call with any problems that may develop before the next visit here.   Rulon Eisenmenger, MD 12/24/16

## 2016-12-24 NOTE — Telephone Encounter (Signed)
90 day supply

## 2016-12-24 NOTE — Telephone Encounter (Signed)
Unable to reach by phone, left detailed voicemail

## 2016-12-25 ENCOUNTER — Telehealth: Payer: Self-pay

## 2016-12-25 NOTE — Telephone Encounter (Signed)
Spoke with pt and states that she needs to have her pet scan rescheduled for after 4/5. Pt states that she is seeing Dr. Beryle Beams on 4/5 at 2pm to evaluate her tubes. Pt is also scheduled to see Dr.Gudena 01/22/2017 at 215pm.Called central scheduling to reschedule pt pet scan to 01/14/17 at 2pm. Instructed pt to be npo 6hrs prior to exam and to arrive 15-3minutes early at [facility]. Pt verbally confirmed appointments and times.

## 2016-12-25 NOTE — Telephone Encounter (Signed)
Pt approved for PET scan. Called central scheduling and first available time/date is 01/02/17 at 2pm. Pt instructed to nothing by mouth (eat or drink) 6 hrs before test (starting at 9am). Pt to arrive at 1:30 [facility] radiology to sign in and get registered. Called pt but unable to reach. LVM with call back number.

## 2016-12-26 DIAGNOSIS — C50911 Malignant neoplasm of unspecified site of right female breast: Secondary | ICD-10-CM | POA: Diagnosis not present

## 2016-12-26 DIAGNOSIS — N62 Hypertrophy of breast: Secondary | ICD-10-CM | POA: Diagnosis not present

## 2016-12-26 DIAGNOSIS — Z950 Presence of cardiac pacemaker: Secondary | ICD-10-CM | POA: Diagnosis not present

## 2016-12-26 DIAGNOSIS — Z930 Tracheostomy status: Secondary | ICD-10-CM | POA: Diagnosis not present

## 2016-12-26 DIAGNOSIS — I130 Hypertensive heart and chronic kidney disease with heart failure and stage 1 through stage 4 chronic kidney disease, or unspecified chronic kidney disease: Secondary | ICD-10-CM | POA: Diagnosis not present

## 2016-12-26 DIAGNOSIS — Z483 Aftercare following surgery for neoplasm: Secondary | ICD-10-CM | POA: Diagnosis not present

## 2016-12-26 DIAGNOSIS — Z7901 Long term (current) use of anticoagulants: Secondary | ICD-10-CM | POA: Diagnosis not present

## 2016-12-26 DIAGNOSIS — N183 Chronic kidney disease, stage 3 (moderate): Secondary | ICD-10-CM | POA: Diagnosis not present

## 2016-12-26 DIAGNOSIS — Z9013 Acquired absence of bilateral breasts and nipples: Secondary | ICD-10-CM | POA: Diagnosis not present

## 2016-12-26 DIAGNOSIS — I2510 Atherosclerotic heart disease of native coronary artery without angina pectoris: Secondary | ICD-10-CM | POA: Diagnosis not present

## 2016-12-26 DIAGNOSIS — Z8673 Personal history of transient ischemic attack (TIA), and cerebral infarction without residual deficits: Secondary | ICD-10-CM | POA: Diagnosis not present

## 2016-12-26 DIAGNOSIS — I482 Chronic atrial fibrillation: Secondary | ICD-10-CM | POA: Diagnosis not present

## 2016-12-26 DIAGNOSIS — G8921 Chronic pain due to trauma: Secondary | ICD-10-CM | POA: Diagnosis not present

## 2016-12-26 DIAGNOSIS — I509 Heart failure, unspecified: Secondary | ICD-10-CM | POA: Diagnosis not present

## 2016-12-27 LAB — PROTIME-INR: INR: 2.6 — AB (ref 0.9–1.1)

## 2016-12-28 ENCOUNTER — Encounter: Payer: Self-pay | Admitting: Family Medicine

## 2017-01-01 ENCOUNTER — Encounter: Payer: Self-pay | Admitting: Family Medicine

## 2017-01-02 ENCOUNTER — Ambulatory Visit (HOSPITAL_COMMUNITY): Payer: PPO

## 2017-01-03 DIAGNOSIS — G8921 Chronic pain due to trauma: Secondary | ICD-10-CM | POA: Diagnosis not present

## 2017-01-03 DIAGNOSIS — N62 Hypertrophy of breast: Secondary | ICD-10-CM | POA: Diagnosis not present

## 2017-01-03 DIAGNOSIS — Z8673 Personal history of transient ischemic attack (TIA), and cerebral infarction without residual deficits: Secondary | ICD-10-CM | POA: Diagnosis not present

## 2017-01-03 DIAGNOSIS — Z930 Tracheostomy status: Secondary | ICD-10-CM | POA: Diagnosis not present

## 2017-01-03 DIAGNOSIS — C50911 Malignant neoplasm of unspecified site of right female breast: Secondary | ICD-10-CM | POA: Diagnosis not present

## 2017-01-03 DIAGNOSIS — Z950 Presence of cardiac pacemaker: Secondary | ICD-10-CM | POA: Diagnosis not present

## 2017-01-03 DIAGNOSIS — Z483 Aftercare following surgery for neoplasm: Secondary | ICD-10-CM | POA: Diagnosis not present

## 2017-01-04 ENCOUNTER — Other Ambulatory Visit: Payer: Self-pay | Admitting: Family Medicine

## 2017-01-04 MED ORDER — TIZANIDINE HCL 2 MG PO CAPS: 2.0000 mg | ORAL_CAPSULE | Freq: Three times a day (TID) | ORAL | 1 refills | Status: DC | PRN

## 2017-01-04 NOTE — Progress Notes (Signed)
Muscle spasms; no zanaflex Her INR is 3 on recent check She checked INR Taking 12 mg every night 12 mg 5 nights a week, 10 mg 2 nights a week (Mon and Fri) Recheck on Thursday She would like to write a reclining [employer] Unable to lay flat in bed, trach, post-surgical pain, big and tall [name] is her HCPOA; I'd be happy to meet her ---------------------------- [name], please write out Rx for Big and Tall reclining chair for home use Dx: trach, status post bilateral mastectomy, orthopnea LON 99 months I'll sign and then please fax Thank you

## 2017-01-08 ENCOUNTER — Encounter: Payer: Self-pay | Admitting: Family Medicine

## 2017-01-09 ENCOUNTER — Telehealth: Payer: Self-pay

## 2017-01-09 NOTE — Telephone Encounter (Signed)
Per the request of Dr. Enid Derry, I contacted this patient to instruct her to take 12mg  of Coumadin today and to find out how she has been taking her medication.  She said that she was taking it the way Dr. Sanda Klein told her to on last week (10mg  on Monday and Friday) and she did not know why she keeps decreasing her dosage when she has been doing good at 12mg  daily.   She said that she was not feeling too well due to the cancer and that she wanted Dr. Sanda Klein to give her a call on her cell phone at [phone] explaining why she cannot stay on the 12mg  each day.   She gave verbal permission to leave a detailed message on her voicemail if she does not answer since she has an appt in [place] at 2pm. She stated that she will be leaving her house at 1pm.

## 2017-01-10 IMAGING — DX DG CHEST 1V PORT
1 series · 1 of 1 positions shown · non-contrast
Comparison: 05/06/2010

CLINICAL DATA: Short of breath

EXAM:
PORTABLE CHEST 1 VIEW

[chest ap]
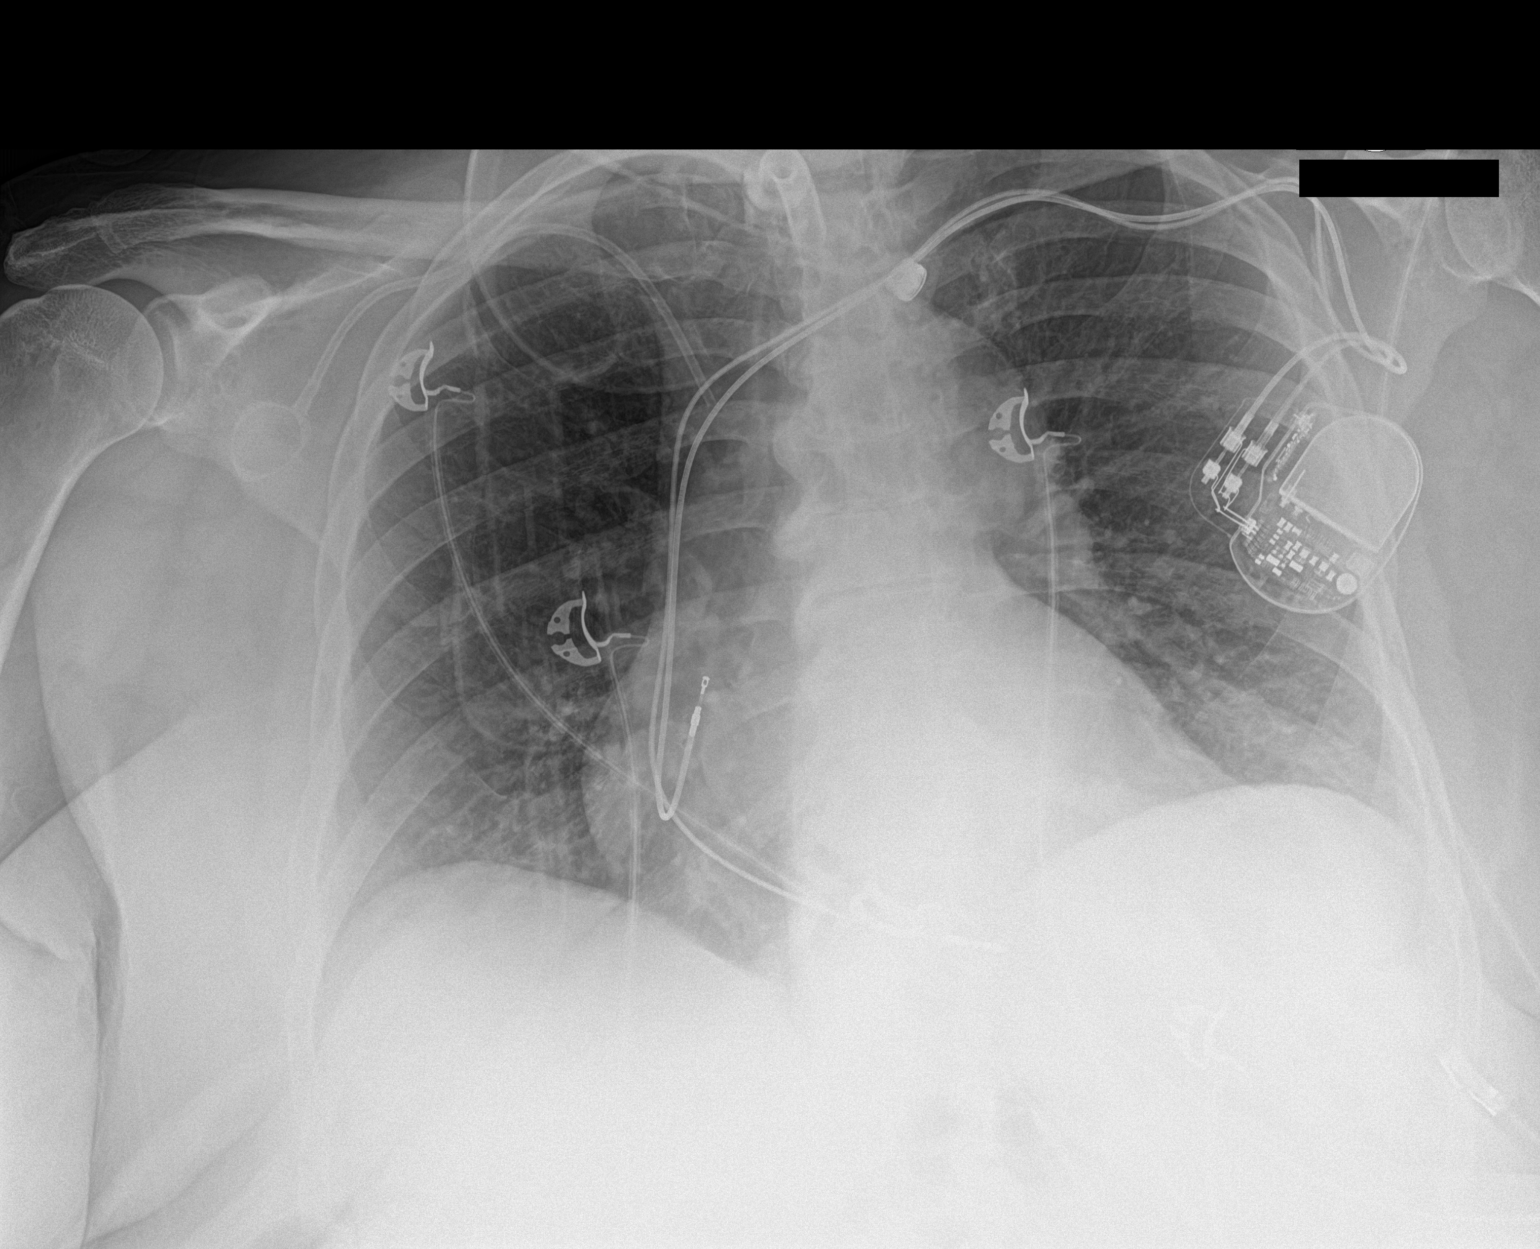

[1 of 1 positions shown; findings below may reference images not displayed]

FINDINGS: Tracheostomy in good position. Port-A-Cath tip in the right
innominate vein. Dual lead pacemaker unchanged from the prior study
with leads in the right atrium and right ventricle.

Lungs are clear. Negative for infiltrate or effusion. Negative for
heart failure.
IMPRESSION: No active disease.

## 2017-01-10 NOTE — Telephone Encounter (Signed)
Thank you so much for the detailed note She may resume 12 mg daily and let's recheck in 1 week I hope she starts feeling better soon and healing

## 2017-01-10 NOTE — Telephone Encounter (Signed)
Per the request of Dr. Enid Derry, I contacted this patient and left a message on her voicemail stating that it was ok to resume taking Coumadin 12mg  daily and then recheck in 1 week.   Dr. Delight Ovens condolences was given and patient  was asked to give our office a call if she had any questions.

## 2017-01-11 ENCOUNTER — Encounter: Payer: Self-pay | Admitting: *Deleted

## 2017-01-11 ENCOUNTER — Encounter: Payer: Self-pay | Admitting: Family Medicine

## 2017-01-11 LAB — PROTIME-INR: INR: 1.9 — AB (ref 0.9–1.1)

## 2017-01-14 ENCOUNTER — Ambulatory Visit (HOSPITAL_COMMUNITY)
Admission: RE | Admit: 2017-01-14 | Discharge: 2017-01-14 | Disposition: A | Discharge status: Home / Self Care | Payer: PPO | Source: Ambulatory Visit | Attending: Hematology and Oncology | Admitting: Hematology and Oncology

## 2017-01-14 ENCOUNTER — Encounter (HOSPITAL_COMMUNITY): Payer: Self-pay

## 2017-01-14 DIAGNOSIS — Z9013 Acquired absence of bilateral breasts and nipples: Secondary | ICD-10-CM | POA: Insufficient documentation

## 2017-01-14 DIAGNOSIS — Z170 Estrogen receptor positive status [ER+]: Secondary | ICD-10-CM | POA: Diagnosis not present

## 2017-01-14 DIAGNOSIS — Z930 Tracheostomy status: Secondary | ICD-10-CM | POA: Insufficient documentation

## 2017-01-14 DIAGNOSIS — C7951 Secondary malignant neoplasm of bone: Secondary | ICD-10-CM | POA: Diagnosis not present

## 2017-01-14 DIAGNOSIS — I700 Atherosclerosis of aorta: Secondary | ICD-10-CM | POA: Diagnosis not present

## 2017-01-14 DIAGNOSIS — C50211 Malignant neoplasm of upper-inner quadrant of right female breast: Secondary | ICD-10-CM | POA: Insufficient documentation

## 2017-01-14 DIAGNOSIS — C50919 Malignant neoplasm of unspecified site of unspecified female breast: Secondary | ICD-10-CM | POA: Diagnosis not present

## 2017-01-14 LAB — GLUCOSE, CAPILLARY: GLUCOSE-CAPILLARY: 92 mg/dL (ref 65–99)

## 2017-01-14 MED ORDER — FLUDEOXYGLUCOSE F - 18 (FDG) INJECTION
12.6500 | Freq: Once | INTRAVENOUS | Status: AC | PRN
  Administered 2017-01-14: 12.65 via INTRAVENOUS

## 2017-01-15 ENCOUNTER — Telehealth: Payer: Self-pay

## 2017-01-15 NOTE — Telephone Encounter (Signed)
Called patients ins they stated I needed to go online print prior auth form which they talked me through fill out and send back in.  They stated process could take up to 14 business days to even get approved.  Patient notified.

## 2017-01-15 NOTE — Telephone Encounter (Signed)
Please see prescription; already done; see if you can help her

## 2017-01-15 NOTE — Telephone Encounter (Signed)
Pt states you spoke with her last Thursday or Friday regarding a chair to help her sit up. She states you were suppose to put it in Monday. Pt states she really need the chair and would like to know if you are still willing to help her with that.

## 2017-01-17 DIAGNOSIS — I482 Chronic atrial fibrillation: Secondary | ICD-10-CM | POA: Diagnosis not present

## 2017-01-17 LAB — PROTIME-INR: INR: 2.3 — AB (ref 0.9–1.1)

## 2017-01-18 ENCOUNTER — Encounter: Payer: Self-pay | Admitting: Family Medicine

## 2017-01-21 ENCOUNTER — Ambulatory Visit (HOSPITAL_BASED_OUTPATIENT_CLINIC_OR_DEPARTMENT_OTHER): Payer: PPO | Admitting: Hematology and Oncology

## 2017-01-21 ENCOUNTER — Encounter: Payer: Self-pay | Admitting: Hematology and Oncology

## 2017-01-21 ENCOUNTER — Other Ambulatory Visit: Payer: Self-pay | Admitting: Hematology and Oncology

## 2017-01-21 ENCOUNTER — Other Ambulatory Visit: Payer: Self-pay | Admitting: Emergency Medicine

## 2017-01-21 ENCOUNTER — Ambulatory Visit (HOSPITAL_BASED_OUTPATIENT_CLINIC_OR_DEPARTMENT_OTHER): Payer: PPO

## 2017-01-21 DIAGNOSIS — Z79811 Long term (current) use of aromatase inhibitors: Secondary | ICD-10-CM | POA: Diagnosis not present

## 2017-01-21 DIAGNOSIS — C50211 Malignant neoplasm of upper-inner quadrant of right female breast: Secondary | ICD-10-CM

## 2017-01-21 DIAGNOSIS — C50411 Malignant neoplasm of upper-outer quadrant of right female breast: Secondary | ICD-10-CM | POA: Insufficient documentation

## 2017-01-21 DIAGNOSIS — C7951 Secondary malignant neoplasm of bone: Secondary | ICD-10-CM

## 2017-01-21 DIAGNOSIS — M545 Low back pain: Secondary | ICD-10-CM | POA: Diagnosis not present

## 2017-01-21 DIAGNOSIS — Z170 Estrogen receptor positive status [ER+]: Principal | ICD-10-CM

## 2017-01-21 LAB — COMPREHENSIVE METABOLIC PANEL
ALBUMIN: 3.1 g/dL — AB (ref 3.5–5.0)
ALK PHOS: 83 U/L (ref 40–150)
ALT: 11 U/L (ref 0–55)
ANION GAP: 8 meq/L (ref 3–11)
AST: 26 U/L (ref 5–34)
BILIRUBIN TOTAL: 0.35 mg/dL (ref 0.20–1.20)
BUN: 14.6 mg/dL (ref 7.0–26.0)
CO2: 28 mEq/L (ref 22–29)
Calcium: 10.3 mg/dL (ref 8.4–10.4)
Chloride: 105 mEq/L (ref 98–109)
Creatinine: 1.3 mg/dL — ABNORMAL HIGH (ref 0.6–1.1)
EGFR: 50 mL/min/{1.73_m2} — AB (ref >90)
Glucose: 93 mg/dl (ref 70–140)
Potassium: 4.1 mEq/L (ref 3.5–5.1)
Sodium: 141 mEq/L (ref 136–145)
TOTAL PROTEIN: 7.8 g/dL (ref 6.4–8.3)

## 2017-01-21 LAB — CBC WITH DIFFERENTIAL/PLATELET
BASO%: 0.5 % (ref 0.0–2.0)
Basophils Absolute: 0 10*3/uL (ref 0.0–0.1)
EOS%: 1.4 % (ref 0.0–7.0)
Eosinophils Absolute: 0.1 10*3/uL (ref 0.0–0.5)
HEMATOCRIT: 31.5 % — AB (ref 34.8–46.6)
HGB: 10.4 g/dL — ABNORMAL LOW (ref 11.6–15.9)
LYMPH#: 1.5 10*3/uL (ref 0.9–3.3)
LYMPH%: 25.7 % (ref 14.0–49.7)
MCH: 28.2 pg (ref 25.1–34.0)
MCHC: 33 g/dL (ref 31.5–36.0)
MCV: 85.6 fL (ref 79.5–101.0)
MONO#: 0.7 10*3/uL (ref 0.1–0.9)
MONO%: 11 % (ref 0.0–14.0)
NEUT#: 3.6 10*3/uL (ref 1.5–6.5)
NEUT%: 61.4 % (ref 38.4–76.8)
Platelets: 327 10*3/uL (ref 145–400)
RBC: 3.68 10*6/uL — ABNORMAL LOW (ref 3.70–5.45)
RDW: 14.4 % (ref 11.2–14.5)
WBC: 5.9 10*3/uL (ref 3.9–10.3)

## 2017-01-21 MED ORDER — PALBOCICLIB 125 MG PO CAPS: 125.0000 mg | ORAL_CAPSULE | Freq: Every day | ORAL | 1 refills | Status: DC

## 2017-01-21 NOTE — Assessment & Plan Note (Signed)
Right mastectomy 12/13/2016: Multifocal tumor largest 2.3 cm, remaining 6 foci 0.3-1.2 centimeters, IDC with papillary features and smaller foci of IDC with lobular features grade 2, 19/20 lymph nodes positive, ER 100%, PR 100%, HER-2 negative, Ki-67 15%; repeat testing on the final path EF 30%, PR 0%, HER-2 negative ratio 1.67, Ki-67 20% RCB class 3 T2N3a Stage 3C  07/16/2016: PET/CT scan: Right breast inflammatory breast cancer, T7 osseous metastases, indeterminate focus medial left breast corresponding to 7 mm calcified soft tissue nodule, right adrenal adenoma  Clinical stage: T4 N1M1stage IV  Treatment summary: 1. Neoadjuvant chemotherapy with TaxotereCytoxan 4 (because patient has history of cardiac problems and has a pacemaker); 07/19/2016 to 09/23/2016 2. Rt mastectomy with axillary node dissection 12/13/2016 3. Consideration for adjuvant radiation including radiation to T7 vertebral body 4. Followed by antiestrogen therapy with anastrozole 10 years ----------------------------------------------------------------------------------------------------------------------------------------- Goal of treatment:  Treatment plan: 1. Patient is not keen on receiving adjuvant radiation. 2. PET CT scan: 3 bone metastases identified. 3. Plan of treatment: letrozole 2.5 mg daily. 4. Repeat PET scan in 3 months and if there was evidence of progression, then we will plan to add Palbociclib to her treatment.  Return to clinic in 3 months for follow-up

## 2017-01-21 NOTE — Progress Notes (Addendum)
[name], MD as PCP - General (Family Medicine) [name], MD as Consulting Physician (Cardiology) [place], LCSW as [place] [facility]  DIAGNOSIS:  Encounter Diagnosis  Name Primary?  . Malignant neoplasm of upper-inner quadrant of right breast in female, estrogen receptor positive ([place])     SUMMARY OF ONCOLOGIC HISTORY:   Breast cancer of upper-inner quadrant of right female breast ([place])   06/15/2016 Mammogram    Right breast irregular mass UIQ with diffuse skin thickening that is palpable, additional pleomorphic calcifications 2.8 x 2.1 x 3.5 cm extending posteriorly for another 2 cm; left breast 8 mm circumscribed nodule       06/21/2016 Initial Diagnosis    Right breast biopsy UIQ: IDC grade 2 with papillary features ER 100%, PR 100%, Ki-67 15%, HER-2 negative ratio 1.21; right axillary LN biopsy metastatic carcinoma in one lymph node with ECE, ER 45%, PR 0%, Ki-67 20%, HER-2 negative ratio 1.86      06/28/2016 Procedure    Left breast biopsy: Fibroadenoma      07/16/2016 PET scan    Right breast primary inflammatory breast cancer, right axillary lymph node metastases, T7 bone metastases, indeterminate focus in the left medial breast 7 mm, right adrenal nodule      07/19/2016 - 09/20/2016 Neo-Adjuvant Chemotherapy    Neoadjuvant Taxotere and Cytoxan every 3 weeks 4 cycles      12/13/2016 Surgery    Right mastectomy: Multifocal tumor largest 2.3 cm, remaining 6 foci 0.3-1.2 centimeters, IDC with papillary features and smaller foci of IDC with lobular features grade 2, 19/20 lymph nodes positive, ER 100%, PR 100%, HER-2 negative, Ki-67 15%; repeat testing on the final path EF 30%, PR 0%, HER-2 negative ratio 1.67, Ki-67 20% RCB class 3 T2N3a Stage 3C      01/14/2017 PET scan    Multifocal hypermetabolic bone metastases within the T7 vertebra SUV 10.7. New uptake within L5 SUV 11. Previously known uptake at right  inferior pubic ramus SUV 8.2.      01/21/2017 -  Anti-estrogen oral therapy    Palbociclib with Letrozole       CHIEF COMPLIANT: Follow up on PET scan  INTERVAL HISTORY: [name] is a 66 yr old with metastatic breast cancer who is here to discuss a PET/CT scan and to discuss the treatment options. She has some low back pain and is very anxious to find the PET scan results. She has started letrozole and has tolerated it fairly well. Denies any hot flashes or myalgias  REVIEW OF SYSTEMS:   Constitutional: Denies fevers, chills or abnormal weight loss Eyes: Denies blurriness of vision Ears, nose, mouth, throat, and face: Denies mucositis or sore throat Respiratory: Denies cough, dyspnea or wheezes Cardiovascular: Denies palpitation, chest discomfort Gastrointestinal:  Denies nausea, heartburn or change in bowel habits Skin: Denies abnormal skin rashes Lymphatics: Denies new lymphadenopathy or easy bruising Neurological:Denies numbness, tingling or new weaknesses Behavioral/Psych: Mood is stable, no new changes  Extremities: No lower extremity edema Breast: Bilateral mastectomies All other systems were reviewed with the patient and are negative.  I have reviewed the past medical history, past surgical history, social history and family history with the patient and they are unchanged from previous note.  ALLERGIES:  is allergic to lisinopril and eggs or egg-derived products.  MEDICATIONS:  Current Outpatient Prescriptions  Medication Sig Dispense Refill  . cholecalciferol (VITAMIN D) 1000 UNITS tablet Take 1,000 Units by mouth daily.    Marland Kitchen  gabapentin (NEURONTIN) 300 MG capsule TAKE 1 CAPSULE (300 MG TOTAL) BY MOUTH 3 (THREE) TIMES DAILY. 90 capsule 5  . hydrochlorothiazide (HYDRODIURIL) 12.5 MG tablet Take 1 tablet (12.5 mg total) by mouth daily. (Patient taking differently: Take 12.5 mg by mouth at bedtime. ) 90 tablet 1  . latanoprost (XALATAN) 0.005 % ophthalmic solution  Place 1 drop into both eyes at bedtime.     Marland Kitchen letrozole (FEMARA) 2.5 MG tablet Take 1 tablet (2.5 mg total) by mouth daily. 90 tablet 3  . lidocaine-prilocaine (EMLA) cream Apply to affected area once (Patient taking differently: Apply 1 application topically daily as needed (prior to port access). Apply to affected area once) 30 g 3  . losartan (COZAAR) 100 MG tablet Take 1 tablet (100 mg total) by mouth daily. (Patient taking differently: Take 100 mg by mouth at bedtime. ) 30 tablet 5  . ondansetron (ZOFRAN) 8 MG tablet Take 1 tablet (8 mg total) by mouth 2 (two) times daily as needed for refractory nausea / vomiting. Start on day 3 after chemo. 30 tablet 1  . oxyCODONE (OXY IR/ROXICODONE) 5 MG immediate release tablet Take 1-2 tablets (5-10 mg total) by mouth every 4 (four) hours as needed for moderate pain. 40 tablet 0  . palbociclib (IBRANCE) 125 MG capsule Take 1 capsule (125 mg total) by mouth daily with breakfast. Take whole with food. 21 capsule 1  . pantoprazole (PROTONIX) 40 MG tablet Take 1 tablet (40 mg total) by mouth daily as needed. Caution:prolonged use may increase risk of pneumonia, colitis, osteoporosis, anemia (Patient taking differently: Take 40 mg by mouth daily as needed (for acid reflux/gerd). Caution:prolonged use may increase risk of pneumonia, colitis, osteoporosis, anemia) 90 tablet 0  . potassium chloride (KLOR-CON M10) 10 MEQ tablet Take 1 tablet (10 mEq total) by mouth 3 (three) times daily. 270 tablet 1  . prochlorperazine (COMPAZINE) 10 MG tablet Take 1 tablet (10 mg total) by mouth every 6 (six) hours as needed for nausea or vomiting. 30 tablet 3  . promethazine (PHENERGAN) 12.5 MG tablet Take 12.5 mg by mouth every 6 (six) hours as needed for nausea or vomiting.    . tizanidine (ZANAFLEX) 2 MG capsule Take 1-2 capsules (2-4 mg total) by mouth every 8 (eight) hours as needed for muscle spasms. 40 capsule 1  . triamcinolone cream (KENALOG) 0.1 % Apply 1 application  topically 2 (two) times daily. If needed (Patient taking differently: Apply 1 application topically 2 (two) times daily as needed (for dry/itchy skin or rash.). If needed) 45 g 1  . warfarin (COUMADIN) 4 MG tablet Take 3 tablets (12 mg total) by mouth daily. Or as otherwise directed by your doctor 270 tablet 1   No current facility-administered medications for this visit.     PHYSICAL EXAMINATION: ECOG PERFORMANCE STATUS: 2 - Symptomatic, <50% confined to bed  Vitals:   01/21/17 1354  BP: (!) 151/74  Pulse: 77  Resp: 18  Temp: 97.6 F (36.4 C)   Filed Weights   01/21/17 1354  Weight: 240 lb 8 oz (109.1 kg)    GENERAL:alert, no distress and comfortable SKIN: skin color, texture, turgor are normal, no rashes or significant lesions EYES: normal, Conjunctiva are pink and non-injected, sclera clear OROPHARYNX:no exudate, no erythema and lips, buccal mucosa, and tongue normal  NECK: supple, thyroid normal size, non-tender, without nodularity LYMPH:  no palpable lymphadenopathy in the cervical, axillary or inguinal LUNGS: clear to auscultation and percussion with normal breathing effort HEART:  regular rate & rhythm and no murmurs and no lower extremity edema ABDOMEN:abdomen soft, non-tender and normal bowel sounds MUSCULOSKELETAL:no cyanosis of digits and no clubbing  NEURO: alert & oriented x 3 with fluent speech, no focal motor/sensory deficits EXTREMITIES: No lower extremity edema  LABORATORY DATA:  I have reviewed the data as listed   Chemistry      Component Value Date/Time   NA 141 01/21/2017 1511   K 4.1 01/21/2017 1511   CL 107 12/17/2016 0414   CO2 28 01/21/2017 1511   BUN 14.6 01/21/2017 1511   CREATININE 1.3 (H) 01/21/2017 1511   GLU 98 11/26/2014      Component Value Date/Time   CALCIUM 10.3 01/21/2017 1511   ALKPHOS 83 01/21/2017 1511   AST 26 01/21/2017 1511   ALT 11 01/21/2017 1511   BILITOT 0.35 01/21/2017 1511       Lab Results  Component Value  Date   WBC 5.9 01/21/2017   HGB 10.4 (L) 01/21/2017   HCT 31.5 (L) 01/21/2017   MCV 85.6 01/21/2017   PLT 327 01/21/2017   NEUTROABS 3.6 01/21/2017    ASSESSMENT & PLAN:  Breast cancer of upper-inner quadrant of right female breast ([place]) Right mastectomy 12/13/2016: Multifocal tumor largest 2.3 cm, remaining 6 foci 0.3-1.2 centimeters, IDC with papillary features and smaller foci of IDC with lobular features grade 2, 19/20 lymph nodes positive, ER 100%, PR 100%, HER-2 negative, Ki-67 15%; repeat testing on the final path EF 30%, PR 0%, HER-2 negative ratio 1.67, Ki-67 20% RCB class 3 T2N3a Stage 3C  07/16/2016: PET/CT scan: Right breast inflammatory breast cancer, T7 osseous metastases, indeterminate focus medial left breast corresponding to 7 mm calcified soft tissue nodule, right adrenal adenoma  Clinical stage: T4 N1M1stage IV  Treatment summary: 1. Neoadjuvant chemotherapy with TaxotereCytoxan 4 (because patient has history of cardiac problems and has a pacemaker); 07/19/2016 to 09/23/2016 2. Rt mastectomy with axillary node dissection 12/13/2016 3. Consideration for adjuvant radiation including radiation to T7 vertebral body: Patient refused  ----------------------------------------------------------------------------------------------------------------------------------------- Goal of treatment: Palliation and control of disease Treatment plan: 1. Patient is not keen on receiving adjuvant radiation. 2. PET CT scan: 3 bone metastases identified. T7, L5, right inferior pubic ramus 3. Plan of treatment: letrozole 2.5 mg daily with Palbociclib. Started 01/21/2017   I discussed at length the risks and benefits of Palbociclib in combination with letrozole. Adverse effects of Palbociclib include decreasing neutrophil count, pneumonia, blood clots in lungs as well as nausea and GI symptoms. Side effects of letrozole include hot flashes, muscle aches and pains, uterine  bleeding/spotting/cancer, osteoporosis, risk of blood clots.  Return to clinic in one month for follow-up  I spent 25 minutes talking to the patient of which more than half was spent in counseling and coordination of care.  No orders of the defined types were placed in this encounter.  The patient has a good understanding of the overall plan. she agrees with it. she will call with any problems that may develop before the next visit here.   Rulon Eisenmenger, MD 01/21/17  Addendum: Patient requires home health to access her port and draw her blood in between our clinic visits. She will be seen by me once a month. However in between the visits she needs blood drawn from home health. This is absolutely necessary for the patient to undergo Palbociclib treatment. Without port access, she is an extremely hard stick. We will request home health agency to perform CBC with differential  through the port once a month in between our treatment visits.

## 2017-01-22 ENCOUNTER — Ambulatory Visit: Payer: PPO | Admitting: Hematology and Oncology

## 2017-01-22 ENCOUNTER — Other Ambulatory Visit: Payer: Self-pay | Admitting: Family Medicine

## 2017-01-22 DIAGNOSIS — M25569 Pain in unspecified knee: Secondary | ICD-10-CM

## 2017-01-25 LAB — PROTIME-INR: INR: 2.5 — AB (ref 0.9–1.1)

## 2017-01-28 ENCOUNTER — Telehealth: Payer: Self-pay

## 2017-01-28 ENCOUNTER — Telehealth: Payer: Self-pay | Admitting: *Deleted

## 2017-01-28 ENCOUNTER — Other Ambulatory Visit: Payer: Self-pay

## 2017-01-28 ENCOUNTER — Encounter: Payer: Self-pay | Admitting: Family Medicine

## 2017-01-28 DIAGNOSIS — C50211 Malignant neoplasm of upper-inner quadrant of right female breast: Secondary | ICD-10-CM

## 2017-01-28 DIAGNOSIS — Z170 Estrogen receptor positive status [ER+]: Principal | ICD-10-CM

## 2017-01-28 NOTE — Progress Notes (Signed)
Spoke with pt over the phone regarding needing blood draws and port flushes this week after 1 week of starting her Ibrance. Will need [facility]ed [facility].   Placed home health referral and faxed orders to [phone]. Placed orders for CBC/CMET and port flush to be drawn this week per Dr.Gudena. (1 week after initiating Ibrance 01/21/17) Then, once a month x3 months.   Pt to have labs and md follow up once a month at the cancer center.Next appt for lab draw 02/18/17  Will call pt back to verify and confirmation of orders with [facility].    1630-Called pt back to confirm that [facility] received the lab orders and referral that we faxed today. Pt will be contacted tomorrow. Pt appreciative of call.

## 2017-01-28 NOTE — Telephone Encounter (Signed)
Thank you so much for that information With new medicines, let's recheck her INR on Thursday Thank you

## 2017-01-28 NOTE — Telephone Encounter (Signed)
Pt called stating she has not had blood drawn or port flushed. Requesting home health to come out. Informed pt we will call [facility] with the orders for lab drawn and port flush. Received verbal understanding. Denies further needs at this time.

## 2017-01-28 NOTE — Telephone Encounter (Signed)
I called patient to inform about coumadin check and she states the cancer doctor has started her on new meds and they may effect her coumadin FYI.  Also just recently had a pet scan with new changes all can be viewed in EMR.

## 2017-01-29 ENCOUNTER — Other Ambulatory Visit: Payer: Self-pay | Admitting: *Deleted

## 2017-01-29 NOTE — Progress Notes (Signed)
Faxed recent progress note to [facility] today per request. [facility] [phone]

## 2017-01-29 NOTE — Patient Outreach (Signed)
[place] [facility] [place]) Care Management  01/29/2017  [name] 07/27/1951 [id number]   Patient referred to this social worker for resources to obtain a lift chair. Per patient she has 2 spots of cancer on her spine and one on her cervix area. Patient reports that she  cannot lay flat or on her side and has to sleep in a chair Patient reports that her doctor is  recommending a lift chair however she  cannot afford the co-pay. Per patient, Medicare will only 80% and HTA will cover 20% towards the motor not the chair which would leave an out of pocket expense of $1300.00 .  Patient  willing to consider a recliner if possible. Patient reports having some friends that check on her regularly but are not in the position to help her financially. Patient has also reached out to her church, however  they cannot assist financially either. Patient gave this social worker the contact information for [employer] [phone] who has a Health visitor, however the referral must come from a Education officer, museum.   Plan:This social worker contacedt [place] for possible assistance. Voicemail message was left for a return call. Hours of operation Monday and Wednesday 1-4pm Tuesday and Thursdays 9am-12pm    [place], [place] Care Management [phone]

## 2017-01-29 NOTE — Telephone Encounter (Signed)
Patient notified

## 2017-01-29 NOTE — Patient Outreach (Signed)
[place] [facility]) Care Management  01/29/2017  Aneesa Romey [birth year] [id number]   Referral received from HTA  To assist patient with needed DME. Phone call made to patient, HIPPA compliant voicemail message left requesting a return phone call.  Plan: This Education officer, museum will follow up with an additional call within 1 week.    [name] [facility] Care Management [phone]

## 2017-01-30 ENCOUNTER — Telehealth: Payer: Self-pay | Admitting: Pharmacist

## 2017-01-30 DIAGNOSIS — Z483 Aftercare following surgery for neoplasm: Secondary | ICD-10-CM | POA: Diagnosis not present

## 2017-01-30 DIAGNOSIS — Z170 Estrogen receptor positive status [ER+]: Secondary | ICD-10-CM | POA: Diagnosis not present

## 2017-01-30 DIAGNOSIS — D242 Benign neoplasm of left breast: Secondary | ICD-10-CM | POA: Diagnosis not present

## 2017-01-30 DIAGNOSIS — I2510 Atherosclerotic heart disease of native coronary artery without angina pectoris: Secondary | ICD-10-CM | POA: Diagnosis not present

## 2017-01-30 DIAGNOSIS — Z9011 Acquired absence of right breast and nipple: Secondary | ICD-10-CM | POA: Diagnosis not present

## 2017-01-30 DIAGNOSIS — I509 Heart failure, unspecified: Secondary | ICD-10-CM | POA: Diagnosis not present

## 2017-01-30 DIAGNOSIS — C7951 Secondary malignant neoplasm of bone: Secondary | ICD-10-CM | POA: Diagnosis not present

## 2017-01-30 DIAGNOSIS — I110 Hypertensive heart disease with heart failure: Secondary | ICD-10-CM | POA: Diagnosis not present

## 2017-01-30 DIAGNOSIS — C50211 Malignant neoplasm of upper-inner quadrant of right female breast: Secondary | ICD-10-CM | POA: Diagnosis not present

## 2017-01-30 DIAGNOSIS — C773 Secondary and unspecified malignant neoplasm of axilla and upper limb lymph nodes: Secondary | ICD-10-CM | POA: Diagnosis not present

## 2017-01-30 DIAGNOSIS — Z452 Encounter for adjustment and management of vascular access device: Secondary | ICD-10-CM | POA: Diagnosis not present

## 2017-01-30 NOTE — Telephone Encounter (Signed)
Oral Chemotherapy Pharmacist Encounter  Received notification from [facility] that patient's Ibrance prescription would require prior authorization. Labs from 01/21/17 reviewed, OK for treatment Current medication list in Epic assessed, no DDIs with [facility] identified  Prior authorization submitted on CoverMyMeds Key [id number] Unable to verify benefits online I called [employer] at [phone] to initiate PA over the phone Status is pending Reference# [id number]  [facility] will continue to follow.  Johny Drilling, PharmD, BCPS, BCOP 01/30/2017  3:49 PM Oral Oncology Clinic [phone]

## 2017-01-31 ENCOUNTER — Other Ambulatory Visit: Payer: Self-pay | Admitting: *Deleted

## 2017-01-31 NOTE — Patient Outreach (Signed)
[place] [facility]) Care Management  01/31/2017  [name] 1950-10-28 [id number]   Return phone call from patient. This Education officer, museum discussed the need for a home visit to complete an intake assessment and referral paperwork for the [place]. Patient was hesitant to schedule a home visit, stating that she has identified a couple of [place] through [place] [employer].  Per patient, they show the items by appointment only and is waiting on a friend of hers to let her know when she can take her to see the items.  Patient asked if this social worker could go to look at the items. This Education officer, museum declined, discussing the role of [facility] care managements as community resource linkage.  Alternative supports explored including her church family. Per patient, she has asked them and they have either said no or would charge her. Patient verbalized continued reluctance to schedule a home visit and stated that she would call this social worker back after she has spoke with her friend who will help her schedule an appointment and provide transportation to see the lift chair through the [place]. Patient reports laredy checking with the [place] as well.   Plan: This Education officer, museum will await patient's return phone call           This social worker will follow up with patient within 1 week if she has not returned this social worker's call.    [name] [facility] Care Management [phone]

## 2017-01-31 NOTE — Patient Outreach (Signed)
[facility]) Care Management  01/31/2017  Asherah Lavoy May 16, 1951 [id number]   Referral paper work from the [employer] received to request a lift chair for patient, however eligibility requires a home visit to verify patient's need for the item. Phone call to patient to schedule a home visit. HIPPA complaint voicemail message left requesting a phone call.   Plan: This Education officer, museum will await a return call from patient to schedule the initial home visit.    [name] [facility] Care Management [phone]

## 2017-02-01 ENCOUNTER — Other Ambulatory Visit: Payer: Self-pay | Admitting: *Deleted

## 2017-02-01 ENCOUNTER — Encounter: Payer: Self-pay | Admitting: *Deleted

## 2017-02-01 NOTE — Patient Outreach (Signed)
[name]) Care Management  02/01/2017  [name] 05-Oct-1951 [id number]   Phone call from patient stating that she was given a lift chair by a friend of hers. Per patient, she is very appreciative of this "I can finally get some sleep" and needs no additional assistance in obtaining the chair through the [place].  Patient verbalized having no additional community resource needs.  Patient to be closed to [facility] care management.   [name] [facility] Care Management [phone]

## 2017-02-04 ENCOUNTER — Telehealth: Payer: Self-pay

## 2017-02-04 NOTE — Telephone Encounter (Signed)
Pt.notified

## 2017-02-04 NOTE — Telephone Encounter (Signed)
Pt states she takes three  coumadin  t tablets ( 12 mg) by mouth @ bedtime for the last 7 days.  Pt report no bleeding with the INR. Instruct pt to start taking her Coumadin around 5 or 6 pm. She also asked me since she takes her BP medicine when she takes her  Coumadin, would you like for her to take BP medicine around the same time.

## 2017-02-04 NOTE — Telephone Encounter (Signed)
Please document exactly how she has been taking her coumadin for the last 7 days so I may adjust Also, please ask her to start taking her coumadin dose around 5 or 6 pm so we can always adjust her medicine if needed without her having her dose already for the day Lastly, please document if she is having any bleeding with that INR Thank you

## 2017-02-04 NOTE — Telephone Encounter (Signed)
Tried reaching out to pt calling the number preferred but was not able to reach the pt. I was able to call her cell and reach her but call dropped. Tried calling back but couldn't hear pt.

## 2017-02-04 NOTE — Telephone Encounter (Signed)
Pt states her Pt/INR is 4.7. Your last note you asked her to re check it since she started the new medicine IBANCE 125 mg caps. Pt asking what would you like to do; her last pill was taken around 11:42 am this morning.

## 2017-02-04 NOTE — Telephone Encounter (Signed)
It doesn't matter in relation to the BP medicine We just traditionally always given coumadin in the later afternoon and early evening HOLD coumadin tomorrow and Wednesday, recheck INR Wednesday morning and call us If any excessive bleeding, contact on-call doctor or go to  ER

## 2017-02-06 ENCOUNTER — Other Ambulatory Visit: Payer: Self-pay | Admitting: *Deleted

## 2017-02-06 NOTE — Patient Outreach (Signed)
[place] [facility]) Care Management  02/06/2017  [name] 12/22/1950 [id number]   Phone call from patient stating that the lift chair that a friend gave her did not work. Patient willing to go through the eligibility process for the [place]. Patient understands that there are no guarantees in receiving a lift chair through this program, however she is willing to try it.   Plan: This social worker will call patient by the end of the week to schedule home visit.   [name] [facility] Care Management [phone]

## 2017-02-06 NOTE — Patient Outreach (Signed)
[place] [facility]) Care Management  02/06/2017  [name] 1951/09/10 [id number]   Phone call to patient to schedule home visit. HIPPA compliant voicemail message left for a return call.    [name] [facility] Care Management [phone]

## 2017-02-07 ENCOUNTER — Other Ambulatory Visit: Payer: Self-pay | Admitting: *Deleted

## 2017-02-07 DIAGNOSIS — Z9011 Acquired absence of right breast and nipple: Secondary | ICD-10-CM | POA: Diagnosis not present

## 2017-02-07 DIAGNOSIS — Z483 Aftercare following surgery for neoplasm: Secondary | ICD-10-CM | POA: Diagnosis not present

## 2017-02-07 DIAGNOSIS — C50211 Malignant neoplasm of upper-inner quadrant of right female breast: Secondary | ICD-10-CM | POA: Diagnosis not present

## 2017-02-07 DIAGNOSIS — C773 Secondary and unspecified malignant neoplasm of axilla and upper limb lymph nodes: Secondary | ICD-10-CM | POA: Diagnosis not present

## 2017-02-07 LAB — PROTIME-INR: INR: 1.4 — AB (ref 0.9–1.1)

## 2017-02-07 NOTE — Patient Outreach (Signed)
[facility]) Care Management  02/07/2017  [name] 29-Jan-1951 [id number]   Referral received from [place] member concierge to assist patient with resources for a lift chair. Patient willing to now go through the process of possibility of getting the chair from the [facility].  Required documents obtained and will be completed during the home visit scheduled for 02/08/17 at 11:00am.      [address], [place] Management [phone]

## 2017-02-08 ENCOUNTER — Other Ambulatory Visit: Payer: Self-pay | Admitting: *Deleted

## 2017-02-08 ENCOUNTER — Encounter: Payer: Self-pay | Admitting: *Deleted

## 2017-02-08 ENCOUNTER — Telehealth: Payer: Self-pay | Admitting: Family Medicine

## 2017-02-08 NOTE — Patient Outreach (Signed)
[place] [facility]) Care Management  02/08/2017  [name] Jun 02, 1951 [id number]   Return phone call from Loren Racer, [place] worker at the [facility] who informed this social worker that patient is not a candidate for resources through the cancer center as she is no longer receiving active treatment. This Education officer, museum was given contact information for [email or web address] for possible financial assistance.   Plan: This Education officer, museum will follow up with possible assistance through [email or web address].    [name] [facility] Care Management [phone]

## 2017-02-08 NOTE — Patient Outreach (Signed)
[name]) Care Management  [facility] Social Work  02/08/2017  [name] [birth year] [id number]  Subjective:   Patietnt is a 66 year old African American woman. Patient has long history of medical conditions having been diagnosed with bone cancer in college. Per patient, she was diagnosed with cancer 2 1/2 years ago. Patient had a double mastectomy on 01/13/17 and lymph nodes removed. 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  Patient refusing further chemotherapy or radiation cancer  treatments. Per patient, "I am walking out on faith" Patient reports having a positive support network of friends that assist with meals ,transporation to medical  appointments and housekeeping. Finances described limited. She states that she  does not qualify for food stamps or Medicaid at this time and would like assistance in getting a lift chair. Per patient she has cancer on her since that does not allow her to lay flat, therefore she has to sleep in a chair. Patient states a friend of hers purchased a chair for her but once received it was found that it was defective and cannot be used. Objective:   Encounter Medications:  Outpatient Encounter Prescriptions as of 02/08/2017  Medication Sig  . cholecalciferol (VITAMIN D) 1000 UNITS tablet Take 1,000 Units by mouth daily.  Marland Kitchen gabapentin (NEURONTIN) 300 MG capsule TAKE 1 CAPSULE (300 MG TOTAL) BY MOUTH 3 (THREE) TIMES DAILY.  . hydrochlorothiazide (HYDRODIURIL) 12.5 MG tablet Take 1 tablet (12.5 mg total) by mouth daily. (Patient taking differently: Take 12.5 mg by mouth at bedtime. )  . latanoprost (XALATAN) 0.005 % ophthalmic solution Place 1 drop into both eyes at bedtime.   Marland Kitchen letrozole (FEMARA) 2.5 MG tablet Take 1 tablet (2.5 mg total) by mouth daily.  Marland Kitchen lidocaine-prilocaine (EMLA) cream Apply to affected area  once (Patient taking differently: Apply 1 application topically daily as needed (prior to port access). Apply to affected area once)  . losartan (COZAAR) 100 MG tablet Take 1 tablet (100 mg total) by mouth daily. (Patient taking differently: Take 100 mg by mouth at bedtime. )  . ondansetron (ZOFRAN) 8 MG tablet Take 1 tablet (8 mg total) by mouth 2 (two) times daily as needed for refractory nausea / vomiting. Start on day 3 after chemo.  Marland Kitchen oxyCODONE (OXY IR/ROXICODONE) 5 MG immediate release tablet Take 1-2 tablets (5-10 mg total) by mouth every 4 (four) hours as needed for moderate pain.  . palbociclib (IBRANCE) 125 MG capsule Take 1 capsule (125 mg total) by mouth daily with breakfast. Take whole with food.  . pantoprazole (PROTONIX) 40 MG tablet Take 1 tablet (40 mg total) by mouth daily as needed. Caution:prolonged use may increase risk of pneumonia, colitis, osteoporosis, anemia (Patient taking differently: Take 40 mg by mouth daily as needed (for acid reflux/gerd). Caution:prolonged use may increase risk of pneumonia, colitis, osteoporosis, anemia)  . potassium chloride (KLOR-CON M10) 10 MEQ tablet Take 1 tablet (10 mEq total) by mouth 3 (three) times daily.  . prochlorperazine (COMPAZINE) 10 MG tablet Take 1 tablet (10 mg total) by mouth every 6 (six) hours as needed for nausea or vomiting.  . promethazine (PHENERGAN) 12.5 MG tablet Take 12.5 mg by mouth every 6 (six) hours as needed for nausea or vomiting.  . tizanidine (ZANAFLEX) 2 MG capsule Take 1-2 capsules (2-4 mg total) by mouth every 8 (eight) hours as needed  for muscle spasms.  Marland Kitchen triamcinolone cream (KENALOG) 0.1 % Apply 1 application topically 2 (two) times daily. If needed (Patient taking differently: Apply 1 application topically 2 (two) times daily as needed (for dry/itchy skin or rash.). If needed)  . warfarin (COUMADIN) 4 MG tablet Take 3 tablets (12 mg total) by mouth daily. Or as otherwise directed by your doctor   No  facility-administered encounter medications on file as of 02/08/2017.     Functional Status:  In your present state of health, do you have any difficulty performing the following activities: 02/08/2017 12/15/2016  Hearing? N N  Vision? - N  Difficulty concentrating or making decisions? N N  Walking or climbing stairs? Y Y  Dressing or bathing? N N  Doing errands, shopping? Y N  Preparing Food and eating ? Y -  Using the Toilet? N -  In the past six months, have you accidently leaked urine? Y -  Do you have problems with loss of bowel control? N -  Managing your Medications? N -  Managing your Finances? Y -  Housekeeping or managing your Housekeeping? Y -  Some recent data might be hidden    Fall/Depression Screening:  PHQ 2/9 Scores 02/08/2017 11/13/2016 05/30/2016 07/11/2015 11/25/2013  PHQ - 2 Score 0 0 0 0 1    Assessment:  Patient pleasant and engaging. Relies on her spirituality and and friends for support. Patient currently refusing further chemotherapy or radiation which makes her ineligible for resources through the [place]. Patient needing a lift chair to sleep due to cancer spots on her spine. Per patient, she cannot lay flat. Per patient she cannot afford the co-pay for a lift chair. Paperwork completed for [employer] for possible assistance. Plan:  Paper work for [employer] to be submitted on 02/11/17.   [name] [facility] Care Management [phone]

## 2017-02-08 NOTE — Telephone Encounter (Signed)
I talked with patient $11,000 for 21 pills (chemo) They are getting more labs on Monday Her INR is only 1.4 Sub-therapeutic She held her coumadin for 3 days, then took 12 mg She is not protected and I offered for her to start Lovenox; she wants to check her INR and we'll talk in 10 minutes

## 2017-02-11 ENCOUNTER — Encounter: Payer: Self-pay | Admitting: Family Medicine

## 2017-02-11 DIAGNOSIS — Z45018 Encounter for adjustment and management of other part of cardiac pacemaker: Secondary | ICD-10-CM | POA: Diagnosis not present

## 2017-02-11 DIAGNOSIS — Z950 Presence of cardiac pacemaker: Secondary | ICD-10-CM | POA: Diagnosis not present

## 2017-02-11 DIAGNOSIS — I499 Cardiac arrhythmia, unspecified: Secondary | ICD-10-CM | POA: Diagnosis not present

## 2017-02-11 DIAGNOSIS — R001 Bradycardia, unspecified: Secondary | ICD-10-CM | POA: Diagnosis not present

## 2017-02-11 NOTE — Telephone Encounter (Signed)
Pt.notified

## 2017-02-11 NOTE — Telephone Encounter (Signed)
Pt states you spoke with her Saturday and asked her to take her INR today 2:30 pm. Pt states its reading 2.4.

## 2017-02-11 NOTE — Telephone Encounter (Signed)
I spoke with patient on Saturday, INR was 2.0 We agreed to have her alternate 10 mg and 12 mg, after a few days of 12 mg in a row 12 mg on Friday 12 mg on Saturday 10 mg on Sunday  Have her continue the alternating regimen: 12 mg on Monday (today) 10 mg on Tuesday Recheck on Wednesday We'll keep a close eye on her this week until it levels out

## 2017-02-13 NOTE — Telephone Encounter (Signed)
Oral Chemotherapy Pharmacist Encounter  Received notification from [place] that prior authorization of patient's [name] has been approved Effective dates: 01/30/17-01/30/18  I alerted [facility] to process prescription, copayment $2512.12 This is prohibitively expensive.   I s/w patient today about copayment assistance options. There are currently no foundation grants available for patient's diagnosis. Patient is in office on 02/18/17 for lab check and MD office visit and will sign Pfizer assistance application at that time. She will bring in income documentation to that visit to send with application.  I also spoke with patient for overview of new oral chemotherapy medication: Ibrance. Pt is doing well. She was able to acquire 1st fill with the assistance of a voucher from [employer]. Start date: 01/21/17 Patient has missed 0 capsules of her Ibrance 125mg  capsules taken once daily with breakfast for 21 days on, 7 days off  Counseled patient on administration, dosing, side effects, safe handling, and monitoring. Patient states she is taking her Ibrance daily with breakfast. She knows to avoid grapefruit. Side effects include but not limited to: N/V/D, rash, alopecia, upper respiratory infection, and decreased blood counts.  Ms. [name] voiced understanding and appreciation.   Patient states she experienced diarrhea for about 4 days during her 2nd week of treatment, relived by loperamide. Diarrhea dissipated on it's own and she has not had to continue to loperamide.  Patient also states she experienced nausea during 3rd week of treatment despite taking her Ibrance with food. This was relived with Zofran. She did experience 2 episodes of emesis. Patient able to tolerate these minor side effects and is very agreeable to continuing with Ibrance treatment.  All questions answered.  Patient knows to call the office with questions or concerns. I look forward to meeting with patient on 5/14.  Pfizer application processing will be updated in a different encounter.  I will provide patient with a bottle of Ibrance samples at 5/14 visit. Dose to be determined by ANC count on that day.  Oral Oncology Clinic will continue to follow.  Thank you,  Johny Drilling, PharmD, BCPS, BCOP 02/13/2017  11:31 AM Oral Oncology Clinic [phone]

## 2017-02-14 ENCOUNTER — Other Ambulatory Visit: Payer: Self-pay | Admitting: *Deleted

## 2017-02-14 DIAGNOSIS — Z483 Aftercare following surgery for neoplasm: Secondary | ICD-10-CM | POA: Diagnosis not present

## 2017-02-14 DIAGNOSIS — Z7689 Persons encountering health services in other specified circumstances: Secondary | ICD-10-CM | POA: Diagnosis not present

## 2017-02-14 DIAGNOSIS — C773 Secondary and unspecified malignant neoplasm of axilla and upper limb lymph nodes: Secondary | ICD-10-CM | POA: Diagnosis not present

## 2017-02-14 DIAGNOSIS — Z9011 Acquired absence of right breast and nipple: Secondary | ICD-10-CM | POA: Diagnosis not present

## 2017-02-14 DIAGNOSIS — C50211 Malignant neoplasm of upper-inner quadrant of right female breast: Secondary | ICD-10-CM | POA: Diagnosis not present

## 2017-02-14 NOTE — Patient Outreach (Signed)
[place] [facility]) Care Management  02/14/2017  [name] 1951-01-08 [id number]   Phone call on 02/13/17 to [facility] to confirm that patient's referral for the lift chair was received.  They do have a lift chair available and have tagged it for patient.  They will reach out to patient to discuss delivery or pick up specifics.     [name] [facility] Care Management [phone]

## 2017-02-17 LAB — PROTIME-INR

## 2017-02-18 ENCOUNTER — Telehealth: Payer: Self-pay | Admitting: Pharmacist

## 2017-02-18 ENCOUNTER — Encounter: Payer: Self-pay | Admitting: Hematology and Oncology

## 2017-02-18 ENCOUNTER — Other Ambulatory Visit: Payer: PPO

## 2017-02-18 ENCOUNTER — Ambulatory Visit (HOSPITAL_BASED_OUTPATIENT_CLINIC_OR_DEPARTMENT_OTHER): Payer: PPO

## 2017-02-18 ENCOUNTER — Encounter: Payer: Self-pay | Admitting: Pharmacist

## 2017-02-18 ENCOUNTER — Encounter: Payer: Self-pay | Admitting: Family Medicine

## 2017-02-18 ENCOUNTER — Ambulatory Visit (HOSPITAL_BASED_OUTPATIENT_CLINIC_OR_DEPARTMENT_OTHER): Payer: PPO | Admitting: Hematology and Oncology

## 2017-02-18 ENCOUNTER — Other Ambulatory Visit (HOSPITAL_BASED_OUTPATIENT_CLINIC_OR_DEPARTMENT_OTHER): Payer: PPO

## 2017-02-18 ENCOUNTER — Ambulatory Visit: Payer: PPO | Admitting: Hematology and Oncology

## 2017-02-18 DIAGNOSIS — C7951 Secondary malignant neoplasm of bone: Secondary | ICD-10-CM

## 2017-02-18 DIAGNOSIS — Z170 Estrogen receptor positive status [ER+]: Secondary | ICD-10-CM

## 2017-02-18 DIAGNOSIS — C50211 Malignant neoplasm of upper-inner quadrant of right female breast: Secondary | ICD-10-CM | POA: Diagnosis not present

## 2017-02-18 DIAGNOSIS — Z95828 Presence of other vascular implants and grafts: Secondary | ICD-10-CM

## 2017-02-18 DIAGNOSIS — Z7901 Long term (current) use of anticoagulants: Secondary | ICD-10-CM | POA: Diagnosis not present

## 2017-02-18 DIAGNOSIS — C773 Secondary and unspecified malignant neoplasm of axilla and upper limb lymph nodes: Secondary | ICD-10-CM

## 2017-02-18 DIAGNOSIS — D701 Agranulocytosis secondary to cancer chemotherapy: Secondary | ICD-10-CM

## 2017-02-18 LAB — PROTIME-INR
INR: 2.6 (ref 2.00–3.50)
Protime: 31.2 Seconds — ABNORMAL HIGH (ref 10.6–13.4)

## 2017-02-18 LAB — COMPREHENSIVE METABOLIC PANEL
ALT: 14 U/L (ref 0–55)
AST: 41 U/L — ABNORMAL HIGH (ref 5–34)
Albumin: 3.5 g/dL (ref 3.5–5.0)
Alkaline Phosphatase: 114 U/L (ref 40–150)
Anion Gap: 8 mEq/L (ref 3–11)
BILIRUBIN TOTAL: 0.41 mg/dL (ref 0.20–1.20)
BUN: 17.9 mg/dL (ref 7.0–26.0)
CHLORIDE: 108 meq/L (ref 98–109)
CO2: 24 meq/L (ref 22–29)
CREATININE: 1.5 mg/dL — AB (ref 0.6–1.1)
Calcium: 10 mg/dL (ref 8.4–10.4)
EGFR: 44 mL/min/{1.73_m2} — AB (ref >90)
GLUCOSE: 78 mg/dL (ref 70–140)
Potassium: 3.6 mEq/L (ref 3.5–5.1)
SODIUM: 140 meq/L (ref 136–145)
TOTAL PROTEIN: 7.7 g/dL (ref 6.4–8.3)

## 2017-02-18 LAB — CBC WITH DIFFERENTIAL/PLATELET
BASO%: 0.7 % (ref 0.0–2.0)
Basophils Absolute: 0 10*3/uL (ref 0.0–0.1)
EOS%: 0.4 % (ref 0.0–7.0)
Eosinophils Absolute: 0 10*3/uL (ref 0.0–0.5)
HCT: 29.5 % — ABNORMAL LOW (ref 34.8–46.6)
HGB: 9.5 g/dL — ABNORMAL LOW (ref 11.6–15.9)
LYMPH%: 47 % (ref 14.0–49.7)
MCH: 28.9 pg (ref 25.1–34.0)
MCHC: 32.2 g/dL (ref 31.5–36.0)
MCV: 89.7 fL (ref 79.5–101.0)
MONO#: 0.3 10*3/uL (ref 0.1–0.9)
MONO%: 11 % (ref 0.0–14.0)
NEUT%: 40.9 % (ref 38.4–76.8)
NEUTROS ABS: 1.2 10*3/uL — AB (ref 1.5–6.5)
Platelets: 199 10*3/uL (ref 145–400)
RBC: 3.29 10*6/uL — AB (ref 3.70–5.45)
RDW: 18.6 % — ABNORMAL HIGH (ref 11.2–14.5)
WBC: 2.8 10*3/uL — AB (ref 3.9–10.3)
lymph#: 1.3 10*3/uL (ref 0.9–3.3)

## 2017-02-18 MED ORDER — SODIUM CHLORIDE 0.9% FLUSH
10.0000 mL | INTRAVENOUS | Status: DC | PRN
  Administered 2017-02-18: 10 mL via INTRAVENOUS
  Filled 2017-02-18: qty 10

## 2017-02-18 MED ORDER — HEPARIN SOD (PORK) LOCK FLUSH 100 UNIT/ML IV SOLN
500.0000 [IU] | Freq: Once | INTRAVENOUS | Status: AC | PRN
  Administered 2017-02-18: 500 [IU] via INTRAVENOUS
  Filled 2017-02-18: qty 5

## 2017-02-18 NOTE — Telephone Encounter (Signed)
Oral Chemotherapy Pharmacist Encounter  I met patient in exam room to complete application for [place] patient assistance program to try to obtain Ibrance at $0 out of pocket cost to the patient from the manufacturer  Completed application faxed to [place] at [phone]  This encounter will continue to be updated until final determination.  Oral Oncology Clinic will continue to follow.   Johny Drilling, PharmD, BCPS, BCOP 02/18/2017  4:36 PM Oral Oncology Clinic [phone]

## 2017-02-18 NOTE — Assessment & Plan Note (Signed)
Right mastectomy 12/13/2016: Multifocal tumor largest 2.3 cm, remaining 6 foci 0.3-1.2 centimeters, IDC with papillary features and smaller foci of IDC with lobular features grade 2, 19/20 lymph nodes positive, ER 100%, PR 100%, HER-2 negative, Ki-67 15%; repeat testing on the final path EF 30%, PR 0%, HER-2 negative ratio 1.67, Ki-67 20% RCB class 3 T2N3a Stage 3C  07/16/2016: PET/CT scan: Right breast inflammatory breast cancer, T7 osseous metastases, indeterminate focus medial left breast corresponding to 7 mm calcified soft tissue nodule, right adrenal adenoma  Clinical stage: T4 N1M1stage IV  Treatment summary: 1. Neoadjuvant chemotherapy with TaxotereCytoxan 4 (because patient has history of cardiac problems and has a pacemaker); 07/19/2016 to 09/23/2016 2. Rt mastectomy with axillary node dissection 12/13/2016 3. Consideration for adjuvant radiation including radiation to T7 vertebral body: Patient refused  ----------------------------------------------------------------------------------------------------------------------------------------- Goal of treatment: Palliation and control of disease Treatment plan: 1. Patient was not keen on receiving adjuvant radiation. 2. PET CT scan: 3 bone metastases identified. T7, L5, right inferior pubic ramus 3. Current treatment: letrozole 2.5 mg daily with Palbociclib. Started 01/21/2017  Toxicities:  Return to clinic in one month for follow-up 

## 2017-02-18 NOTE — Progress Notes (Signed)
Oral Chemotherapy Pharmacist Encounter  Dispensed samples to patient:  Medication: Ibrance 125mg  capsules Instructions: Take 1 capsule by mouth daily with breakfast. Take for 21 days on, 7 days off Quantity dispensed: 21 Days supply: 28 Manufacturer: Pfizer Lot: [id number] Exp: 10/2018  Johny Drilling, PharmD, BCPS, BCOP 02/18/2017 3:41 PM Oral Oncology Clinic [phone]

## 2017-02-18 NOTE — Progress Notes (Signed)
[name], MD as PCP - General (Family Medicine) [name], MD as Consulting Physician (Cardiology) Vern Claude, [place] as [place] Management  DIAGNOSIS:  Encounter Diagnosis  Name Primary?  . Malignant neoplasm of upper-inner quadrant of right breast in female, estrogen receptor positive ([place])     SUMMARY OF ONCOLOGIC HISTORY:   Breast cancer of upper-inner quadrant of right female breast ([place])   06/15/2016 Mammogram    Right breast irregular mass UIQ with diffuse skin thickening that is palpable, additional pleomorphic calcifications 2.8 x 2.1 x 3.5 cm extending posteriorly for another 2 cm; left breast 8 mm circumscribed nodule       06/21/2016 Initial Diagnosis    Right breast biopsy UIQ: IDC grade 2 with papillary features ER 100%, PR 100%, Ki-67 15%, HER-2 negative ratio 1.21; right axillary LN biopsy metastatic carcinoma in one lymph node with ECE, ER 45%, PR 0%, Ki-67 20%, HER-2 negative ratio 1.86      06/28/2016 Procedure    Left breast biopsy: Fibroadenoma      07/16/2016 PET scan    Right breast primary inflammatory breast cancer, right axillary lymph node metastases, T7 bone metastases, indeterminate focus in the left medial breast 7 mm, right adrenal nodule      07/19/2016 - 09/20/2016 Neo-Adjuvant Chemotherapy    Neoadjuvant Taxotere and Cytoxan every 3 weeks 4 cycles      12/13/2016 Surgery    Right mastectomy: Multifocal tumor largest 2.3 cm, remaining 6 foci 0.3-1.2 centimeters, IDC with papillary features and smaller foci of IDC with lobular features grade 2, 19/20 lymph nodes positive, ER 100%, PR 100%, HER-2 negative, Ki-67 15%; repeat testing on the final path EF 30%, PR 0%, HER-2 negative ratio 1.67, Ki-67 20% RCB class 3 T2N3a Stage 3C      01/14/2017 PET scan    Multifocal hypermetabolic bone metastases within the T7 vertebra SUV 10.7. New uptake within L5 SUV 11. Previously known uptake at right  inferior pubic ramus SUV 8.2.      01/21/2017 -  Anti-estrogen oral therapy    Palbociclib with Letrozole       Malignant neoplasm of upper-outer quadrant of right breast in female, estrogen receptor positive ([place])    CHIEF COMPLIANT: Follow-up on Palbociclib with letrozole  INTERVAL HISTORY: [name] is a 66 year old with metastatic breast cancer currently on Palbociclib with letrozole. This was started a month ago. 2 weeks after starting the treatment her blood counts were reviewed and she did not have neutropenia. She had GI toxicities including diarrhea and upset stomach.  REVIEW OF SYSTEMS:   Constitutional: Denies fevers, chills or abnormal weight loss Eyes: Denies blurriness of vision Ears, nose, mouth, throat, and face: Denies mucositis or sore throat Respiratory: Denies cough, dyspnea or wheezes Cardiovascular: Denies palpitation, chest discomfort Gastrointestinal:  Diarrhea and upset stomach, Nausea Skin: Denies abnormal skin rashes Lymphatics: Denies new lymphadenopathy or easy bruising Neurological:Denies numbness, tingling or new weaknesses Behavioral/Psych: Mood is stable, no new changes  Extremities: No lower extremity edema Breast:  denies any pain or lumps or nodules in either breasts All other systems were reviewed with the patient and are negative.  I have reviewed the past medical history, past surgical history, social history and family history with the patient and they are unchanged from previous note.  ALLERGIES:  is allergic to lisinopril and eggs or egg-derived products.  MEDICATIONS:  Current Outpatient Prescriptions  Medication Sig Dispense Refill  .  cholecalciferol (VITAMIN D) 1000 UNITS tablet Take 1,000 Units by mouth daily.    Marland Kitchen gabapentin (NEURONTIN) 300 MG capsule TAKE 1 CAPSULE (300 MG TOTAL) BY MOUTH 3 (THREE) TIMES DAILY. 90 capsule 2  . hydrochlorothiazide (HYDRODIURIL) 12.5 MG tablet Take 1 tablet (12.5 mg total) by mouth daily.  (Patient taking differently: Take 12.5 mg by mouth at bedtime. ) 90 tablet 1  . latanoprost (XALATAN) 0.005 % ophthalmic solution Place 1 drop into both eyes at bedtime.     Marland Kitchen letrozole (FEMARA) 2.5 MG tablet Take 1 tablet (2.5 mg total) by mouth daily. 90 tablet 3  . lidocaine-prilocaine (EMLA) cream Apply to affected area once (Patient taking differently: Apply 1 application topically daily as needed (prior to port access). Apply to affected area once) 30 g 3  . losartan (COZAAR) 100 MG tablet Take 1 tablet (100 mg total) by mouth daily. (Patient taking differently: Take 100 mg by mouth at bedtime. ) 30 tablet 5  . ondansetron (ZOFRAN) 8 MG tablet Take 1 tablet (8 mg total) by mouth 2 (two) times daily as needed for refractory nausea / vomiting. Start on day 3 after chemo. 30 tablet 1  . oxyCODONE (OXY IR/ROXICODONE) 5 MG immediate release tablet Take 1-2 tablets (5-10 mg total) by mouth every 4 (four) hours as needed for moderate pain. 40 tablet 0  . palbociclib (IBRANCE) 125 MG capsule Take 1 capsule (125 mg total) by mouth daily with breakfast. Take whole with food. 21 capsule 1  . pantoprazole (PROTONIX) 40 MG tablet Take 1 tablet (40 mg total) by mouth daily as needed. Caution:prolonged use may increase risk of pneumonia, colitis, osteoporosis, anemia (Patient taking differently: Take 40 mg by mouth daily as needed (for acid reflux/gerd). Caution:prolonged use may increase risk of pneumonia, colitis, osteoporosis, anemia) 90 tablet 0  . potassium chloride (KLOR-CON M10) 10 MEQ tablet Take 1 tablet (10 mEq total) by mouth 3 (three) times daily. 270 tablet 1  . prochlorperazine (COMPAZINE) 10 MG tablet Take 1 tablet (10 mg total) by mouth every 6 (six) hours as needed for nausea or vomiting. 30 tablet 3  . promethazine (PHENERGAN) 12.5 MG tablet Take 12.5 mg by mouth every 6 (six) hours as needed for nausea or vomiting.    . tizanidine (ZANAFLEX) 2 MG capsule Take 1-2 capsules (2-4 mg total) by  mouth every 8 (eight) hours as needed for muscle spasms. 40 capsule 1  . triamcinolone cream (KENALOG) 0.1 % Apply 1 application topically 2 (two) times daily. If needed (Patient taking differently: Apply 1 application topically 2 (two) times daily as needed (for dry/itchy skin or rash.). If needed) 45 g 1  . warfarin (COUMADIN) 4 MG tablet Take 3 tablets (12 mg total) by mouth daily. Or as otherwise directed by your doctor 270 tablet 1   No current facility-administered medications for this visit.    Facility-Administered Medications Ordered in Other Visits  Medication Dose Route Frequency Provider Last Rate Last Dose  . heparin lock flush 100 unit/mL  500 Units Intravenous Once PRN Nicholas Lose, MD      . sodium chloride flush (NS) 0.9 % injection 10 mL  10 mL Intravenous PRN Nicholas Lose, MD        PHYSICAL EXAMINATION: ECOG PERFORMANCE STATUS: 1 - Symptomatic but completely ambulatory  There were no vitals filed for this visit. There were no vitals filed for this visit.  GENERAL:alert, no distress and comfortable SKIN: skin color, texture, turgor are normal, no rashes or  significant lesions EYES: normal, Conjunctiva are pink and non-injected, sclera clear OROPHARYNX:no exudate, no erythema and lips, buccal mucosa, and tongue normal  NECK: supple, thyroid normal size, non-tender, without nodularity LYMPH:  no palpable lymphadenopathy in the cervical, axillary or inguinal LUNGS: clear to auscultation and percussion with normal breathing effort HEART: regular rate & rhythm and no murmurs and no lower extremity edema ABDOMEN:abdomen soft, non-tender and normal bowel sounds MUSCULOSKELETAL:no cyanosis of digits and no clubbing  NEURO: alert & oriented x 3 with fluent speech, no focal motor/sensory deficits EXTREMITIES: No lower extremity edema  LABORATORY DATA:  I have reviewed the data as listed   Chemistry      Component Value Date/Time   NA 141 01/21/2017 1511   K 4.1  01/21/2017 1511   CL 107 12/17/2016 0414   CO2 28 01/21/2017 1511   BUN 14.6 01/21/2017 1511   CREATININE 1.3 (H) 01/21/2017 1511   GLU 98 11/26/2014      Component Value Date/Time   CALCIUM 10.3 01/21/2017 1511   ALKPHOS 83 01/21/2017 1511   AST 26 01/21/2017 1511   ALT 11 01/21/2017 1511   BILITOT 0.35 01/21/2017 1511       Lab Results  Component Value Date   WBC 5.9 01/21/2017   HGB 10.4 (L) 01/21/2017   HCT 31.5 (L) 01/21/2017   MCV 85.6 01/21/2017   PLT 327 01/21/2017   NEUTROABS 3.6 01/21/2017    ASSESSMENT & PLAN:  Breast cancer of upper-inner quadrant of right female breast ([place]) Right mastectomy 12/13/2016: Multifocal tumor largest 2.3 cm, remaining 6 foci 0.3-1.2 centimeters, IDC with papillary features and smaller foci of IDC with lobular features grade 2, 19/20 lymph nodes positive, ER 100%, PR 100%, HER-2 negative, Ki-67 15%; repeat testing on the final path EF 30%, PR 0%, HER-2 negative ratio 1.67, Ki-67 20% RCB class 3 T2N3a Stage 3C  07/16/2016: PET/CT scan: Right breast inflammatory breast cancer, T7 osseous metastases, indeterminate focus medial left breast corresponding to 7 mm calcified soft tissue nodule, right adrenal adenoma  Clinical stage: T4 N1M1stage IV  Treatment summary: 1. Neoadjuvant chemotherapy with TaxotereCytoxan 4 (because patient has history of cardiac problems and has a pacemaker); 07/19/2016 to 09/23/2016 2. Rt mastectomy with axillary node dissection 12/13/2016 3. Consideration for adjuvant radiation including radiation to T7 vertebral body: Patient refused  ----------------------------------------------------------------------------------------------------------------------------------------- Goal of treatment: Palliation and control of disease Treatment plan: 1. Patient was not keen on receiving adjuvant radiation. 2. PET CT scan: 3 bone metastases identified. T7, L5, right inferior pubic ramus 3. Current treatment:  letrozole 2.5 mg daily with Palbociclib. Started 01/21/2017  Toxicities: 1. Diarrhea that lasted 3-4 days (could be due to some of the juices that she is drinking )  2. Nausea for 2 or 3 days   3. Neutropenia: Lab work revealed an [place] of 1.2. Continue with Palbociclib 125 mg. We may reduce the dosage if her neutrophil count drops below 1000.   she will need blood work in 2 weeks by her home health nurse. Return to clinic in one month for follow-up  I spent 25 minutes talking to the patient of which more than half was spent in counseling and coordination of care.  No orders of the defined types were placed in this encounter.  The patient has a good understanding of the overall plan. she agrees with it. she will call with any problems that may develop before the next visit here.   Rulon Eisenmenger, MD 02/18/17

## 2017-02-19 ENCOUNTER — Telehealth: Payer: Self-pay | Admitting: Emergency Medicine

## 2017-02-19 NOTE — Telephone Encounter (Signed)
Spoke with patient; advised patient of INR result of 2.6 on 5/14. Rescheduled patient's lab/flush/MD for 6/11 at 1:00.  Called [facility] for new orders to collect labs in 2 weeks. Spoke with Amy (home health nurse) and advised her of collecting labs in 2 weeks. Verbalized understanding.

## 2017-02-20 ENCOUNTER — Other Ambulatory Visit: Payer: Self-pay | Admitting: *Deleted

## 2017-02-20 NOTE — Patient Outreach (Signed)
[place] [facility]) Care Management  02/20/2017  [name] 22-Sep-1951 [id number]   Return phone call to patient requesting contact information for the [facility] to follow up on the lift chair. Patient requested that this social worker call on her cell phone, however the phone just rang with no possibility to leave a message.   Plan: This Education officer, museum will make another attempt to contact patient within 1 week.    [name] [facility] Care Management [phone]

## 2017-02-21 ENCOUNTER — Other Ambulatory Visit: Payer: Self-pay | Admitting: *Deleted

## 2017-02-21 NOTE — Patient Outreach (Signed)
[place] [facility]) Care Management  02/21/2017  [name] 03-03-1951 [id number]   Care coordination phone call on 02/20/17 to the [facility] to follow up on referral on patient's lift chair donation. Per [facility], they have the chair and they are now trying to coordinate delivery with patient. Per [facility], they are trying to make special delivery accommodations for patient as they only deliver once a month. Their next delivery date will not be until June.    Plan: This social will follow up with patient regarding delivery of the chair within 1 week.    [name] [facility] Care Management [phone]

## 2017-02-24 ENCOUNTER — Other Ambulatory Visit: Payer: Self-pay | Admitting: Family Medicine

## 2017-02-28 ENCOUNTER — Other Ambulatory Visit: Payer: Self-pay | Admitting: Family Medicine

## 2017-02-28 DIAGNOSIS — I10 Essential (primary) hypertension: Secondary | ICD-10-CM

## 2017-02-28 DIAGNOSIS — I482 Chronic atrial fibrillation: Secondary | ICD-10-CM | POA: Diagnosis not present

## 2017-02-28 LAB — PROTIME-INR: INR: 2.6 — AB (ref 0.9–1.1)

## 2017-03-01 ENCOUNTER — Encounter: Payer: Self-pay | Admitting: Family Medicine

## 2017-03-05 DIAGNOSIS — Z9011 Acquired absence of right breast and nipple: Secondary | ICD-10-CM | POA: Diagnosis not present

## 2017-03-05 DIAGNOSIS — C50211 Malignant neoplasm of upper-inner quadrant of right female breast: Secondary | ICD-10-CM | POA: Diagnosis not present

## 2017-03-05 DIAGNOSIS — Z483 Aftercare following surgery for neoplasm: Secondary | ICD-10-CM | POA: Diagnosis not present

## 2017-03-05 DIAGNOSIS — C773 Secondary and unspecified malignant neoplasm of axilla and upper limb lymph nodes: Secondary | ICD-10-CM | POA: Diagnosis not present

## 2017-03-08 ENCOUNTER — Telehealth: Payer: Self-pay | Admitting: Family Medicine

## 2017-03-08 ENCOUNTER — Telehealth: Payer: Self-pay

## 2017-03-08 ENCOUNTER — Encounter: Payer: Self-pay | Admitting: Family Medicine

## 2017-03-08 LAB — PROTIME-INR: INR: 3.6 — AB (ref 0.9–1.1)

## 2017-03-08 NOTE — Telephone Encounter (Signed)
Called pt and left vm to let her know about her lab results. Dr.Gudena reviewed lab work from 03/05/17,  Obtained by [facility]. Pt to continue taking IBRANCE 125 daily, per Dr.Gudena. Call back number provided in vm for further quesitons.

## 2017-03-08 NOTE — Telephone Encounter (Signed)
INR is 3.6, a little high Please confirm current dose and we'll decrease it a little (back to me for instructions)

## 2017-03-08 NOTE — Telephone Encounter (Signed)
Patient states alternating 10 and 12mg 

## 2017-03-08 NOTE — Telephone Encounter (Signed)
Spoke With Dr. lada patient notified will do 12 mg Sunday and wed and 10 mg all other days.

## 2017-03-11 NOTE — Anesthesia Postprocedure Evaluation (Signed)
Anesthesia Post Note  Patient: [name]  Procedure(s) Performed: Procedure(s) (LRB): RIGHT MODIFIED RADICAL MASTECTOMY, LEFT SIMPLE MASTECTOMY (Bilateral)     Anesthesia Post Evaluation  Last Vitals:  Vitals:   12/18/16 0640 12/18/16 0915  BP: (!) 162/84   Pulse: (!) 59   Resp:  20  Temp:      Last Pain:  Vitals:   12/18/16 0548  TempSrc: Oral  PainSc:                  Parisa Pinela S

## 2017-03-11 NOTE — Addendum Note (Signed)
Addendum  created 03/11/17 1211 by Myrtie Soman, MD   Sign clinical note

## 2017-03-13 ENCOUNTER — Other Ambulatory Visit: Payer: Self-pay | Admitting: *Deleted

## 2017-03-13 NOTE — Patient Outreach (Signed)
[facility]) Care Management  03/13/2017  Nastashia Gallo [birth year] [phone]   Phone call to patient to confirm that he received the lift chair from the [employer].  Patient states that it is small but usable until she can find something else.  Patient discussed being approved for financial assistance for her [facility] and is appreciative, however continues to report financial difficulties. Food Assistance resources provided for [employer] [phone], [place] [phone] and [address] of [place]. Per patient, she already has the above resources.  Patient discussed needing repairs to her bathroom, stating that her tub is too high to get into. This Education officer, museum provided patient with the contact number to the [employer] [phone].  Phone call made to this program to confirm that they do repair bathrooms, however patient would need to meet the eligibility requirements. Eligibility requirements discussed with patient, this agency to begin to take applications in August of 2018. Patient encouraged to contact this agency in August for an application.  Patient discussed that she has also been in conversation with the social workers at the [employer] for any available resources, "They have done the best that they can do". Patient verbalized having no additional community resource needs, patient to be closed to [facility] care management at this time.    [name] Care Management [phone]

## 2017-03-14 ENCOUNTER — Encounter: Payer: Self-pay | Admitting: *Deleted

## 2017-03-18 ENCOUNTER — Ambulatory Visit: Payer: PPO | Admitting: Hematology and Oncology

## 2017-03-18 ENCOUNTER — Ambulatory Visit (HOSPITAL_BASED_OUTPATIENT_CLINIC_OR_DEPARTMENT_OTHER): Payer: PPO | Admitting: Hematology and Oncology

## 2017-03-18 ENCOUNTER — Encounter: Payer: Self-pay | Admitting: Hematology and Oncology

## 2017-03-18 ENCOUNTER — Other Ambulatory Visit (HOSPITAL_BASED_OUTPATIENT_CLINIC_OR_DEPARTMENT_OTHER): Payer: PPO

## 2017-03-18 ENCOUNTER — Other Ambulatory Visit: Payer: PPO

## 2017-03-18 DIAGNOSIS — C50211 Malignant neoplasm of upper-inner quadrant of right female breast: Secondary | ICD-10-CM | POA: Diagnosis not present

## 2017-03-18 DIAGNOSIS — C7951 Secondary malignant neoplasm of bone: Secondary | ICD-10-CM

## 2017-03-18 DIAGNOSIS — R791 Abnormal coagulation profile: Secondary | ICD-10-CM | POA: Diagnosis not present

## 2017-03-18 DIAGNOSIS — Z170 Estrogen receptor positive status [ER+]: Secondary | ICD-10-CM | POA: Diagnosis not present

## 2017-03-18 DIAGNOSIS — C773 Secondary and unspecified malignant neoplasm of axilla and upper limb lymph nodes: Secondary | ICD-10-CM | POA: Diagnosis not present

## 2017-03-18 DIAGNOSIS — R5383 Other fatigue: Secondary | ICD-10-CM | POA: Diagnosis not present

## 2017-03-18 LAB — PROTIME-INR
INR: 4.2 — AB (ref 2.00–3.50)
Protime: 50.4 Seconds — ABNORMAL HIGH (ref 10.6–13.4)

## 2017-03-18 LAB — COMPREHENSIVE METABOLIC PANEL
ALT: 10 U/L (ref 0–55)
ANION GAP: 10 meq/L (ref 3–11)
AST: 61 U/L — ABNORMAL HIGH (ref 5–34)
Albumin: 3.3 g/dL — ABNORMAL LOW (ref 3.5–5.0)
Alkaline Phosphatase: 145 U/L (ref 40–150)
BILIRUBIN TOTAL: 0.34 mg/dL (ref 0.20–1.20)
BUN: 26.5 mg/dL — ABNORMAL HIGH (ref 7.0–26.0)
CHLORIDE: 107 meq/L (ref 98–109)
CO2: 24 meq/L (ref 22–29)
Calcium: 10 mg/dL (ref 8.4–10.4)
Creatinine: 2.1 mg/dL — ABNORMAL HIGH (ref 0.6–1.1)
EGFR: 28 mL/min/{1.73_m2} — ABNORMAL LOW (ref >90)
Glucose: 69 mg/dl — ABNORMAL LOW (ref 70–140)
POTASSIUM: 3.9 meq/L (ref 3.5–5.1)
SODIUM: 141 meq/L (ref 136–145)
Total Protein: 7.8 g/dL (ref 6.4–8.3)

## 2017-03-18 LAB — CBC WITH DIFFERENTIAL/PLATELET
BASO%: 1.2 % (ref 0.0–2.0)
Basophils Absolute: 0 10*3/uL (ref 0.0–0.1)
EOS ABS: 0 10*3/uL (ref 0.0–0.5)
EOS%: 0.3 % (ref 0.0–7.0)
HCT: 29.9 % — ABNORMAL LOW (ref 34.8–46.6)
HGB: 9.8 g/dL — ABNORMAL LOW (ref 11.6–15.9)
LYMPH%: 38.2 % (ref 14.0–49.7)
MCH: 29.6 pg (ref 25.1–34.0)
MCHC: 32.7 g/dL (ref 31.5–36.0)
MCV: 90.6 fL (ref 79.5–101.0)
MONO#: 0.2 10*3/uL (ref 0.1–0.9)
MONO%: 5.7 % (ref 0.0–14.0)
NEUT#: 1.5 10*3/uL (ref 1.5–6.5)
NEUT%: 54.6 % (ref 38.4–76.8)
PLATELETS: 217 10*3/uL (ref 145–400)
RBC: 3.3 10*6/uL — AB (ref 3.70–5.45)
RDW: 21.5 % — ABNORMAL HIGH (ref 11.2–14.5)
WBC: 2.7 10*3/uL — ABNORMAL LOW (ref 3.9–10.3)
lymph#: 1 10*3/uL (ref 0.9–3.3)

## 2017-03-18 NOTE — Progress Notes (Signed)
[name], MD as PCP - General (Family Medicine) [name], MD as Consulting Physician (Cardiology)  DIAGNOSIS:  Encounter Diagnosis  Name Primary?  . Malignant neoplasm of upper-inner quadrant of right breast in female, estrogen receptor positive ([name])     SUMMARY OF ONCOLOGIC HISTORY:   Breast cancer of upper-inner quadrant of right female breast ([name])   06/15/2016 Mammogram    Right breast irregular mass UIQ with diffuse skin thickening that is palpable, additional pleomorphic calcifications 2.8 x 2.1 x 3.5 cm extending posteriorly for another 2 cm; left breast 8 mm circumscribed nodule       06/21/2016 Initial Diagnosis    Right breast biopsy UIQ: IDC grade 2 with papillary features ER 100%, PR 100%, Ki-67 15%, HER-2 negative ratio 1.21; right axillary LN biopsy metastatic carcinoma in one lymph node with ECE, ER 45%, PR 0%, Ki-67 20%, HER-2 negative ratio 1.86      06/28/2016 Procedure    Left breast biopsy: Fibroadenoma      07/16/2016 PET scan    Right breast primary inflammatory breast cancer, right axillary lymph node metastases, T7 bone metastases, indeterminate focus in the left medial breast 7 mm, right adrenal nodule      07/19/2016 - 09/20/2016 Neo-Adjuvant Chemotherapy    Neoadjuvant Taxotere and Cytoxan every 3 weeks 4 cycles      12/13/2016 Surgery    Right mastectomy: Multifocal tumor largest 2.3 cm, remaining 6 foci 0.3-1.2 centimeters, IDC with papillary features and smaller foci of IDC with lobular features grade 2, 19/20 lymph nodes positive, ER 100%, PR 100%, HER-2 negative, Ki-67 15%; repeat testing on the final path EF 30%, PR 0%, HER-2 negative ratio 1.67, Ki-67 20% RCB class 3 T2N3a Stage 3C      01/14/2017 PET scan    Multifocal hypermetabolic bone metastases within the T7 vertebra SUV 10.7. New uptake within L5 SUV 11. Previously known uptake at right inferior pubic ramus SUV 8.2.      01/21/2017 -  Anti-estrogen oral  therapy    Palbociclib with Letrozole       Malignant neoplasm of upper-outer quadrant of right breast in female, estrogen receptor positive ([name])    CHIEF COMPLIANT: Follow-up on Palbociclib with letrozole  INTERVAL HISTORY: [name] is a 66 year old with above-mentioned symptoms metastatic breast cancer who is currently on Palbociclib with letrozole. She's been on it for the past 2 months and appears to be tolerating it extremely well. She does not have any nausea vomiting. She complains of profound fatigue today. She does attribute the fatigue to Palbociclib. She also complains of cramps in the legs for which she takes potassium supplementation which appears to be helping her. Her INR today is markedly attenuated. Her primary care physician is been adjusting the dosage of Coumadin.  REVIEW OF SYSTEMS:   Constitutional: Denies fevers, chills or abnormal weight loss Eyes: Denies blurriness of vision Ears, nose, mouth, throat, and face: Denies mucositis or sore throat Respiratory: Denies cough, dyspnea or wheezes Cardiovascular: Denies palpitation, chest discomfort Gastrointestinal:  Denies nausea, heartburn or change in bowel habits Skin: Denies abnormal skin rashes Lymphatics: Denies new lymphadenopathy or easy bruising Neurological:Denies numbness, tingling or new weaknesses Behavioral/Psych: Mood is stable, no new changes  Extremities: No lower extremity edema, complains of leg cramps Breast:  denies any pain or lumps or nodules in either breasts All other systems were reviewed with the patient and are negative.  I have reviewed the past medical history,  past surgical history, social history and family history with the patient and they are unchanged from previous note.  ALLERGIES:  is allergic to lisinopril and eggs or egg-derived products.  MEDICATIONS:  Current Outpatient Prescriptions  Medication Sig Dispense Refill  . cholecalciferol (VITAMIN D) 1000 UNITS tablet  Take 1,000 Units by mouth daily.    Marland Kitchen gabapentin (NEURONTIN) 300 MG capsule TAKE 1 CAPSULE (300 MG TOTAL) BY MOUTH 3 (THREE) TIMES DAILY. 90 capsule 2  . hydrochlorothiazide (HYDRODIURIL) 12.5 MG tablet TAKE 1 TABLET (12.5 MG TOTAL) BY MOUTH DAILY. 90 tablet 1  . latanoprost (XALATAN) 0.005 % ophthalmic solution Place 1 drop into both eyes at bedtime.     Marland Kitchen letrozole (FEMARA) 2.5 MG tablet Take 1 tablet (2.5 mg total) by mouth daily. 90 tablet 3  . lidocaine-prilocaine (EMLA) cream Apply to affected area once (Patient taking differently: Apply 1 application topically daily as needed (prior to port access). Apply to affected area once) 30 g 3  . losartan (COZAAR) 100 MG tablet Take 1 tablet (100 mg total) by mouth daily. (Patient taking differently: Take 100 mg by mouth at bedtime. ) 30 tablet 5  . ondansetron (ZOFRAN) 8 MG tablet Take 1 tablet (8 mg total) by mouth 2 (two) times daily as needed for refractory nausea / vomiting. Start on day 3 after chemo. 30 tablet 1  . oxyCODONE (OXY IR/ROXICODONE) 5 MG immediate release tablet Take 1-2 tablets (5-10 mg total) by mouth every 4 (four) hours as needed for moderate pain. 40 tablet 0  . palbociclib (IBRANCE) 125 MG capsule Take 1 capsule (125 mg total) by mouth daily with breakfast. Take whole with food. 21 capsule 1  . pantoprazole (PROTONIX) 40 MG tablet TAKE 1 TABLET BY MOUTH EVERY DAY 90 tablet 0  . potassium chloride (KLOR-CON M10) 10 MEQ tablet Take 1 tablet (10 mEq total) by mouth 3 (three) times daily. 270 tablet 1  . prochlorperazine (COMPAZINE) 10 MG tablet Take 1 tablet (10 mg total) by mouth every 6 (six) hours as needed for nausea or vomiting. 30 tablet 3  . promethazine (PHENERGAN) 12.5 MG tablet Take 12.5 mg by mouth every 6 (six) hours as needed for nausea or vomiting.    . tizanidine (ZANAFLEX) 2 MG capsule Take 1-2 capsules (2-4 mg total) by mouth every 8 (eight) hours as needed for muscle spasms. 40 capsule 1  . triamcinolone cream  (KENALOG) 0.1 % Apply 1 application topically 2 (two) times daily. If needed (Patient taking differently: Apply 1 application topically 2 (two) times daily as needed (for dry/itchy skin or rash.). If needed) 45 g 1  . warfarin (COUMADIN) 4 MG tablet Take 3 tablets (12 mg total) by mouth daily. Or as otherwise directed by your doctor 270 tablet 1   No current facility-administered medications for this visit.     PHYSICAL EXAMINATION: ECOG PERFORMANCE STATUS: 1 - Symptomatic but completely ambulatory  Vitals:   03/18/17 1415  BP: (!) 181/88  Pulse: 74  Resp: 18  Temp: 97.6 F (36.4 C)   Filed Weights   03/18/17 1415  Weight: 234 lb 8 oz (106.4 kg)    GENERAL:alert, no distress and comfortable SKIN: skin color, texture, turgor are normal, no rashes or significant lesions EYES: normal, Conjunctiva are pink and non-injected, sclera clear OROPHARYNX:no exudate, no erythema and lips, buccal mucosa, and tongue normal  NECK: supple, thyroid normal size, non-tender, without nodularity LYMPH:  no palpable lymphadenopathy in the cervical, axillary or inguinal LUNGS: clear  to auscultation and percussion with normal breathing effort HEART: regular rate & rhythm and no murmurs and no lower extremity edema ABDOMEN:abdomen soft, non-tender and normal bowel sounds MUSCULOSKELETAL:no cyanosis of digits and no clubbing  NEURO: alert & oriented x 3 with fluent speech, no focal motor/sensory deficits EXTREMITIES: No lower extremity edema  LABORATORY DATA:  I have reviewed the data as listed   Chemistry      Component Value Date/Time   NA 141 03/18/2017 1334   K 3.9 03/18/2017 1334   CL 107 12/17/2016 0414   CO2 24 03/18/2017 1334   BUN 26.5 (H) 03/18/2017 1334   CREATININE 2.1 (H) 03/18/2017 1334   GLU 98 11/26/2014      Component Value Date/Time   CALCIUM 10.0 03/18/2017 1334   ALKPHOS 145 03/18/2017 1334   AST 61 (H) 03/18/2017 1334   ALT 10 03/18/2017 1334   BILITOT 0.34 03/18/2017  1334       Lab Results  Component Value Date   WBC 2.7 (L) 03/18/2017   HGB 9.8 (L) 03/18/2017   HCT 29.9 (L) 03/18/2017   MCV 90.6 03/18/2017   PLT 217 03/18/2017   NEUTROABS 1.5 03/18/2017    ASSESSMENT & PLAN:  Breast cancer of upper-inner quadrant of right female breast ([place]) Right mastectomy 12/13/2016: Multifocal tumor largest 2.3 cm, remaining 6 foci 0.3-1.2 centimeters, IDC with papillary features and smaller foci of IDC with lobular features grade 2, 19/20 lymph nodes positive, ER 100%, PR 100%, HER-2 negative, Ki-67 15%; repeat testing on the final path EF 30%, PR 0%, HER-2 negative ratio 1.67, Ki-67 20% RCB class 3 T2N3a Stage 3C  07/16/2016: PET/CT scan: Right breast inflammatory breast cancer, T7 osseous metastases, indeterminate focus medial left breast corresponding to 7 mm calcified soft tissue nodule, right adrenal adenoma  Clinical stage: T4 N1M1stage IV  Treatment summary: 1. Neoadjuvant chemotherapy with TaxotereCytoxan 4 (because patient has history of cardiac problems and has a pacemaker); 07/19/2016 to 09/23/2016 2. Rt mastectomy with axillary node dissection 12/13/2016 3. Consideration foradjuvant radiation including radiation to T7 vertebral body: Patient refused  ----------------------------------------------------------------------------------------------------------------------------------------- Goal of treatment: Palliation and control of disease Treatment plan: 1. Patient was not keen on receiving adjuvant radiation. 2. PET CT scan: 3 bone metastases identified. T7, L5, right inferior pubic ramus 3. Current treatment: letrozole 2.5 mg daily with Palbociclib.Started 01/21/2017  Toxicities: 1. Diarrhea that lasted 3-4 days (could be due to some of the juices that she is drinking )  2. Nausea for 2 or 3 days  : Resolved 3. Neutropenia: Lab work revealed an [place] of 1.5. Continue with Palbociclib 125 mg.   Return to clinic in one monthfor  follow-up with bone scan done ahead of time High INR: Instructed her to stop Coumadin today and tomorrow  I spent 25 minutes talking to the patient of which more than half was spent in counseling and coordination of care.  Orders Placed This Encounter  Procedures  . NM Bone Scan Whole Body    Standing Status:   Future    Standing Expiration Date:   03/18/2018    Order Specific Question:   Reason for Exam (SYMPTOM  OR DIAGNOSIS REQUIRED)    Answer:   Bone metastasis from breast cancer staging    Order Specific Question:   If indicated for the ordered procedure, I authorize the administration of a radiopharmaceutical per Radiology protocol    Answer:   Yes    Order Specific Question:   Preferred imaging location?  Answer:   [facility]    Order Specific Question:   Radiology Contrast Protocol - do NOT remove file path    Answer:   _[email or web address]  . CBC with Differential    Standing Status:   Future    Standing Expiration Date:   03/18/2018  . Comprehensive metabolic panel    Standing Status:   Future    Standing Expiration Date:   03/18/2018  . Protime-INR    Standing Status:   Future    Standing Expiration Date:   03/18/2018   The patient has a good understanding of the overall plan. she agrees with it. she will call with any problems that may develop before the next visit here.   Rulon Eisenmenger, MD 03/18/17

## 2017-03-18 NOTE — Assessment & Plan Note (Signed)
Right mastectomy 12/13/2016: Multifocal tumor largest 2.3 cm, remaining 6 foci 0.3-1.2 centimeters, IDC with papillary features and smaller foci of IDC with lobular features grade 2, 19/20 lymph nodes positive, ER 100%, PR 100%, HER-2 negative, Ki-67 15%; repeat testing on the final path EF 30%, PR 0%, HER-2 negative ratio 1.67, Ki-67 20% RCB class 3 T2N3a Stage 3C  07/16/2016: PET/CT scan: Right breast inflammatory breast cancer, T7 osseous metastases, indeterminate focus medial left breast corresponding to 7 mm calcified soft tissue nodule, right adrenal adenoma  Clinical stage: T4 N1M1stage IV  Treatment summary: 1. Neoadjuvant chemotherapy with TaxotereCytoxan 4 (because patient has history of cardiac problems and has a pacemaker); 07/19/2016 to 09/23/2016 2. Rt mastectomy with axillary node dissection 12/13/2016 3. Consideration foradjuvant radiation including radiation to T7 vertebral body: Patient refused  ----------------------------------------------------------------------------------------------------------------------------------------- Goal of treatment: Palliation and control of disease Treatment plan: 1. Patient was not keen on receiving adjuvant radiation. 2. PET CT scan: 3 bone metastases identified. T7, L5, right inferior pubic ramus 3. Current treatment: letrozole 2.5 mg daily with Palbociclib.Started 01/21/2017  Toxicities: 1. Diarrhea that lasted 3-4 days (could be due to some of the juices that she is drinking )  2. Nausea for 2 or 3 days   3. Neutropenia: Lab work revealed an [place] of 1.2. Continue with Palbociclib 125 mg. We may reduce the dosage if her neutrophil count drops below 1000.   she will need blood work in 2 weeks by her home health nurse. Return to clinic in one monthfor follow-up

## 2017-03-19 LAB — PROTIME-INR: INR: 4.2 — AB (ref 0.9–1.1)

## 2017-03-20 ENCOUNTER — Encounter: Payer: Self-pay | Admitting: Family Medicine

## 2017-03-27 NOTE — Progress Notes (Deleted)
MRN : [id number]  [name] is a 66 y.o. (01-04-1951) female who presents with chief complaint of No chief complaint on file. Marland Kitchen  History of Present Illness: ***  No outpatient prescriptions have been marked as taking for the 03/28/17 encounter (Appointment) with Delana Meyer, Dolores Lory, MD.    Past Medical History:  Diagnosis Date  . 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 Anticoagulated on Coumadin   . Arthritis   . Atherosclerosis of coronary artery of native heart without angina pectoris   . Atrial fibrillation, chronic ([place])   . 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 Breast cancer ([place]) 06/21/2016   Right breast  . CHF (congestive heart failure) ([place])   . Chronic hypokalemia   . Chronic kidney disease    stage III  . Chronic pain due to trauma   . Difficult intubation    permanent tracheostomy;lungs filled with water when extubated  . Frequent headaches   . GERD (gastroesophageal reflux disease)   . History of stomach ulcers   . Hypertension   . Hypertension, goal below 140/90   . Myocardial infarction ([place])   . PONV (postoperative nausea and vomiting)   . 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 Spinal headache   . 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 Trachea, ring   . Tracheostomy in place [facility])   . Urinary tract infection, recurrent   . Urine incontinence     Past Surgical History:  Procedure Laterality Date  . ABDOMINAL HYSTERECTOMY    . BACK SURGERY    . BLADDER SURGERY     hitch  . CHOLECYSTECTOMY    . FRACTURE SURGERY     B/L feet and right ankle  . GANGLION CYST EXCISION  2011  . MASTECTOMY MODIFIED RADICAL Bilateral 12/13/2016   Procedure: RIGHT MODIFIED RADICAL MASTECTOMY, LEFT SIMPLE MASTECTOMY;  Surgeon: Coralie Keens, MD;  Location: [place];  Service: General;  Laterality: Bilateral;  . PACEMAKER INSERTION  10/29/2005  . PACEMAKER PLACEMENT   08/22/2012  . PORT A CATH INJECTION ([place] [facility])  07/18/2016  . PORTACATH PLACEMENT Right 07/18/2016   Procedure: INSERTION PORT-A-CATH;  Surgeon: Coralie Keens, MD;  Location: [place];  Service: General;  Laterality: Right;  . TRACHEAL SURGERY  2000  . UVULOPALATOPHARYNGOPLASTY      Social History Social History  Substance Use Topics  . Smoking status: Current Some Day Smoker    Years: 40.00    Types: Cigarettes  . Smokeless tobacco: Never Used     Comment: 3 a week.  pt can't remember when she had one last  . Alcohol use No    Family History Family History  Problem Relation Age of Onset  . Heart disease Mother   . Hypertension Mother   . Anemia Mother   . Heart disease Father   . Hypertension Father   . Anemia Father   . Cancer Unknown   . Heart disease Sister   . Heart disease Brother   . Mental illness Brother   ***  Allergies  Allergen Reactions  . Lisinopril Cough  . Eggs Or Egg-Derived Products Rash    Raw eggs ONLY (boiled, scrambled, etc)  If baked into something it is NOT a issue     REVIEW OF SYSTEMS (Negative unless checked)  Constitutional: [] Weight loss  [] Fever  [] Chills Cardiac: [] Chest pain   [] Chest pressure   []   Palpitations   [] Shortness of breath when laying flat   [] Shortness of breath with exertion. Vascular:  [] Pain in legs with walking   [] Pain in legs at rest  [] History of DVT   [] Phlebitis   [] Swelling in legs   [] Varicose veins   [] Non-healing ulcers Pulmonary:   [] Uses home oxygen   [] Productive cough   [] Hemoptysis   [] Wheeze  [] COPD   [] Asthma Neurologic:  [] Dizziness   [] Seizures   [] History of stroke   [] History of TIA  [] Aphasia   [] Vissual changes   [] Weakness or numbness in arm   [] Weakness or numbness in leg Musculoskeletal:   [] Joint swelling   [] Joint pain   [] Low back pain Hematologic:  [] Easy bruising  [] Easy bleeding   [] Hypercoagulable state   [] Anemic Gastrointestinal:  [] Diarrhea   [] Vomiting  [] Gastroesophageal  reflux/heartburn   [] Difficulty swallowing. Genitourinary:  [] Chronic kidney disease   [] Difficult urination  [] Frequent urination   [] Blood in urine Skin:  [] Rashes   [] Ulcers  Psychological:  [] History of anxiety   []  History of major depression.  Physical Examination  There were no vitals filed for this visit. There is no height or weight on file to calculate BMI. Gen: WD/WN, NAD Head: [place]/AT, No temporalis wasting.  Ear/Nose/Throat: Hearing grossly intact, nares w/o erythema or drainage, poor dentition Eyes: PER, EOMI, sclera nonicteric.  Neck: Supple, no masses.  No bruit or JVD.  Pulmonary:  Good air movement, clear to auscultation bilaterally, no use of accessory muscles.  Cardiac: RRR, normal S1, S2, no Murmurs. Vascular: *** Vessel Right Left  Radial Palpable Palpable  Ulnar Palpable Palpable  Brachial Palpable Palpable  Carotid Palpable Palpable  Femoral Palpable Palpable  Popliteal Palpable Palpable  PT Palpable Palpable  DP Palpable Palpable   Gastrointestinal: soft, non-distended. No guarding/no peritoneal signs.  Musculoskeletal: M/S 5/5 throughout.  No deformity or atrophy.  Neurologic: CN 2-12 intact. Pain and light touch intact in extremities.  Symmetrical.  Speech is fluent. Motor exam as listed above. Psychiatric: Judgment intact, Mood & affect appropriate for pt's clinical situation. Dermatologic: No rashes or ulcers noted.  No changes consistent with cellulitis. Lymph : No Cervical lymphadenopathy, no lichenification or skin changes of chronic lymphedema.  CBC Lab Results  Component Value Date   WBC 2.7 (L) 03/18/2017   HGB 9.8 (L) 03/18/2017   HCT 29.9 (L) 03/18/2017   MCV 90.6 03/18/2017   PLT 217 03/18/2017    BMET    Component Value Date/Time   NA 141 03/18/2017 1334   K 3.9 03/18/2017 1334   CL 107 12/17/2016 0414   CO2 24 03/18/2017 1334   GLUCOSE 69 (L) 03/18/2017 1334   BUN 26.5 (H) 03/18/2017 1334   CREATININE 2.1 (H) 03/18/2017 1334     CALCIUM 10.0 03/18/2017 1334   GFRNONAA 43 (L) 12/17/2016 0414   GFRNONAA 37 (L) 05/30/2016 1659   GFRAA 50 (L) 12/17/2016 0414   GFRAA 43 (L) 05/30/2016 1659   Estimated Creatinine Clearance: 32.5 mL/min (A) (by C-G formula based on SCr of 2.1 mg/dL (H)).  COAG Lab Results  Component Value Date   INR 4.2 (A) 03/19/2017   INR 4.20 (H) 03/18/2017   INR 3.6 (A) 03/08/2017   PROTIME 50.4 (H) 03/18/2017   PROTIME 31.2 (H) 02/18/2017   PROTIME 49.2 (H) 10/05/2016    Radiology No results found.  Outside Studies/Documentation *** pages of outside documents were reviewed.  They showed ***.  Assessment/Plan 1. Chronic kidney disease (CKD), stage III (  moderate) ***  2. Hypertension goal BP (blood pressure) < 140/90 ***  3. CAD in native artery ***  4. Atrial fibrillation, chronic ([place]) ***    Hortencia Pilar, MD  03/27/2017 9:20 PM

## 2017-03-28 ENCOUNTER — Encounter (INDEPENDENT_AMBULATORY_CARE_PROVIDER_SITE_OTHER): Payer: PPO | Admitting: Vascular Surgery

## 2017-03-29 LAB — PROTIME-INR: INR: 2.1 — AB (ref 0.9–1.1)

## 2017-04-01 ENCOUNTER — Telehealth: Payer: Self-pay | Admitting: *Deleted

## 2017-04-01 NOTE — Telephone Encounter (Signed)
Scheduled and confirmed bone scan on 7.11.18. Move lab and flush appts accordingly. Pt denies further questions or needs at this time.

## 2017-04-04 ENCOUNTER — Encounter: Payer: Self-pay | Admitting: Family Medicine

## 2017-04-08 NOTE — Telephone Encounter (Signed)
Oral Oncology Patient Advocate Encounter  Called from [employer] Patient Assistance program to follow up on an application from May.  I was informed that the  patient has been successfully enrolled into their program to receive Ibrance at $0 until 10/07/2017.  The first drug shipment was sent on 03/19/2017. .  Oral Oncology Clinic will continue to follow.  Gilmore Laroche, CPhT, [place] Oral Oncology Patient Advocate [phone] 04/08/2017 9:09 AM

## 2017-04-08 NOTE — Telephone Encounter (Signed)
This is Dr. Geralyn Flash pt.

## 2017-04-12 DIAGNOSIS — I482 Chronic atrial fibrillation: Secondary | ICD-10-CM | POA: Diagnosis not present

## 2017-04-12 LAB — PROTIME-INR: INR: 2 — AB (ref 0.9–1.1)

## 2017-04-16 ENCOUNTER — Encounter: Payer: Self-pay | Admitting: Family Medicine

## 2017-04-17 ENCOUNTER — Encounter (HOSPITAL_COMMUNITY)
Admission: RE | Admit: 2017-04-17 | Discharge: 2017-04-17 | Disposition: A | Discharge status: Home / Self Care | Payer: PPO | Source: Ambulatory Visit | Attending: Hematology and Oncology | Admitting: Hematology and Oncology

## 2017-04-17 ENCOUNTER — Ambulatory Visit: Payer: PPO

## 2017-04-17 ENCOUNTER — Other Ambulatory Visit: Payer: PPO

## 2017-04-17 ENCOUNTER — Ambulatory Visit (HOSPITAL_COMMUNITY)
Admission: RE | Admit: 2017-04-17 | Discharge: 2017-04-17 | Disposition: A | Discharge status: Home / Self Care | Payer: PPO | Source: Ambulatory Visit | Attending: Hematology and Oncology | Admitting: Hematology and Oncology

## 2017-04-17 ENCOUNTER — Other Ambulatory Visit (HOSPITAL_BASED_OUTPATIENT_CLINIC_OR_DEPARTMENT_OTHER): Payer: PPO

## 2017-04-17 DIAGNOSIS — Z170 Estrogen receptor positive status [ER+]: Secondary | ICD-10-CM

## 2017-04-17 DIAGNOSIS — R937 Abnormal findings on diagnostic imaging of other parts of musculoskeletal system: Secondary | ICD-10-CM | POA: Insufficient documentation

## 2017-04-17 DIAGNOSIS — C50211 Malignant neoplasm of upper-inner quadrant of right female breast: Secondary | ICD-10-CM | POA: Diagnosis not present

## 2017-04-17 DIAGNOSIS — Z95828 Presence of other vascular implants and grafts: Secondary | ICD-10-CM

## 2017-04-17 DIAGNOSIS — N1330 Unspecified hydronephrosis: Secondary | ICD-10-CM | POA: Diagnosis not present

## 2017-04-17 DIAGNOSIS — R791 Abnormal coagulation profile: Secondary | ICD-10-CM

## 2017-04-17 DIAGNOSIS — C7951 Secondary malignant neoplasm of bone: Secondary | ICD-10-CM | POA: Diagnosis not present

## 2017-04-17 DIAGNOSIS — C773 Secondary and unspecified malignant neoplasm of axilla and upper limb lymph nodes: Secondary | ICD-10-CM

## 2017-04-17 DIAGNOSIS — R930 Abnormal findings on diagnostic imaging of skull and head, not elsewhere classified: Secondary | ICD-10-CM | POA: Diagnosis not present

## 2017-04-17 DIAGNOSIS — M8500 Fibrous dysplasia (monostotic), unspecified site: Secondary | ICD-10-CM | POA: Diagnosis not present

## 2017-04-17 LAB — CBC WITH DIFFERENTIAL/PLATELET
BASO%: 1.6 % (ref 0.0–2.0)
Basophils Absolute: 0 10*3/uL (ref 0.0–0.1)
EOS%: 0.8 % (ref 0.0–7.0)
Eosinophils Absolute: 0 10*3/uL (ref 0.0–0.5)
HCT: 22.8 % — ABNORMAL LOW (ref 34.8–46.6)
HEMOGLOBIN: 7.4 g/dL — AB (ref 11.6–15.9)
LYMPH#: 0.9 10*3/uL (ref 0.9–3.3)
LYMPH%: 68.2 % — AB (ref 14.0–49.7)
MCH: 30.2 pg (ref 25.1–34.0)
MCHC: 32.5 g/dL (ref 31.5–36.0)
MCV: 93.1 fL (ref 79.5–101.0)
MONO#: 0.1 10*3/uL (ref 0.1–0.9)
MONO%: 3.9 % (ref 0.0–14.0)
NEUT%: 25.5 % — AB (ref 38.4–76.8)
NEUTROS ABS: 0.3 10*3/uL — AB (ref 1.5–6.5)
Platelets: 65 10*3/uL — ABNORMAL LOW (ref 145–400)
RBC: 2.45 10*6/uL — ABNORMAL LOW (ref 3.70–5.45)
RDW: 21.6 % — ABNORMAL HIGH (ref 11.2–14.5)
WBC: 1.3 10*3/uL — AB (ref 3.9–10.3)
nRBC: 8 % — ABNORMAL HIGH (ref 0–0)

## 2017-04-17 LAB — TECHNOLOGIST REVIEW

## 2017-04-17 LAB — COMPREHENSIVE METABOLIC PANEL
ALT: 20 U/L (ref 0–55)
AST: 107 U/L — AB (ref 5–34)
Albumin: 3.5 g/dL (ref 3.5–5.0)
Alkaline Phosphatase: 205 U/L — ABNORMAL HIGH (ref 40–150)
Anion Gap: 11 mEq/L (ref 3–11)
BUN: 31.4 mg/dL — AB (ref 7.0–26.0)
CHLORIDE: 111 meq/L — AB (ref 98–109)
CO2: 20 mEq/L — ABNORMAL LOW (ref 22–29)
Calcium: 9.9 mg/dL (ref 8.4–10.4)
Creatinine: 2.5 mg/dL — ABNORMAL HIGH (ref 0.6–1.1)
EGFR: 23 mL/min/{1.73_m2} — ABNORMAL LOW (ref >90)
GLUCOSE: 81 mg/dL (ref 70–140)
POTASSIUM: 4.6 meq/L (ref 3.5–5.1)
SODIUM: 142 meq/L (ref 136–145)
Total Bilirubin: 0.37 mg/dL (ref 0.20–1.20)
Total Protein: 7.6 g/dL (ref 6.4–8.3)

## 2017-04-17 LAB — PROTIME-INR
INR: 3.1 (ref 2.00–3.50)
Protime: 37.2 Seconds — ABNORMAL HIGH (ref 10.6–13.4)

## 2017-04-17 MED ORDER — TECHNETIUM TC 99M MEDRONATE IV KIT
25.0000 | PACK | Freq: Once | INTRAVENOUS | Status: AC | PRN
  Administered 2017-04-17: 18.2 via INTRAVENOUS

## 2017-04-17 MED ORDER — SODIUM CHLORIDE 0.9% FLUSH
10.0000 mL | INTRAVENOUS | Status: DC | PRN
  Administered 2017-04-17: 10 mL via INTRAVENOUS
  Filled 2017-04-17: qty 10

## 2017-04-17 NOTE — Patient Instructions (Signed)

## 2017-04-18 ENCOUNTER — Telehealth: Payer: Self-pay

## 2017-04-18 ENCOUNTER — Ambulatory Visit: Payer: PPO | Admitting: Hematology and Oncology

## 2017-04-18 ENCOUNTER — Telehealth: Payer: Self-pay | Admitting: *Deleted

## 2017-04-18 NOTE — Telephone Encounter (Signed)
Received call from pt POA Lenora that pt is unable to come in to see Dr. Lindi Adie today. Cancel appt. Informed she has already been r/s by Dr. Lindi Adie nurse for 7/19. Denies further needs at this time.

## 2017-04-18 NOTE — Telephone Encounter (Signed)
Spoke with patient to return her call. Pt states that she started to have diarrhea today and is not feeling so well. Pt cannot make it to her appointment today and will need to reschedule. Pt states that she is unable to talk at this time and request to call her POA friend, Lanore.   Called and spoke with Lanore and told her to make sure to let pt know the following: Stop Ibrance for another week due to low ANC 0.3, hg 7.4, plt 65. Per Dr.Gudena hold the ibrance and recheck blood work in 1 week. Decrease Ibrance dose to 100mg  daily when okay to restart. INR 3.1, on 4mg  Coumadin. Per Dr.Gudena, okay to keep dose at this time with current labs. Advised pt to monitor for bleeding and for signs of infection closely. Watch for fevers, chills, lethargy, bleeding in the gums, nose, mouth, urine or stool. Pt advised to check temps at least twice a day and to practice good hand hygiene. Pt to wear a mask when around other people and to maintain good hydration. Instructed pt to call with any worsening of diarrhea (pt taking anti diarrhea medication), or bleeding occurs. Pt friend wrote all information/ instruction for pt. Confirmed appt time/date for next week.

## 2017-04-19 ENCOUNTER — Telehealth: Payer: Self-pay

## 2017-04-19 NOTE — Telephone Encounter (Signed)
Patient was notified of new coumadin dose of 12 mg on wed. And 10 mg all other days.  Patient also wants you to review labs from Dr. Lindi Adie, she states are not good? Please advise

## 2017-04-19 NOTE — Telephone Encounter (Signed)
I reviewed labs; tried to call patient; would not answer, no ability to leave message, said "please enter your remote access code", pause, then disconnected

## 2017-04-19 NOTE — Telephone Encounter (Signed)
I tried the other number; no identifying voice or name, left brief message without using her name; thinking about her, call if anything I can do

## 2017-04-22 ENCOUNTER — Telehealth: Payer: Self-pay

## 2017-04-22 ENCOUNTER — Encounter: Payer: Self-pay | Admitting: Family Medicine

## 2017-04-22 LAB — PROTIME-INR: INR: 3.1 — AB (ref 0.9–1.1)

## 2017-04-22 NOTE — Telephone Encounter (Signed)
Patient called back wants you to call about labs and if taking a certain supplement will effect her INR? Please advise call on home number

## 2017-04-22 NOTE — Telephone Encounter (Signed)
I spoke with patient She has diarrhea and vomiting; they say it's coming from her chemo; they stopped it for a week She goes back up to see the cancer doctor on Thursday She asked about ginger tea, has been on it all along Now on 10 mg daily except for 12 mg on just Wednesdays Her chemo medicine likely also affects her INR Trying bland diet, check temp frequently She went on MyChart, cannot see her labs; she called her oncologist and they said it may take 7 days Our system is 4 days They told her that her white count was really low, hemoglobin really low, platelets really low; they wouldn't give her the numbers She is not in any pain; doesn't hurt any where; just feels bad Try to get adequate sleep, get good diet, limit stress Thinking of her

## 2017-04-22 NOTE — Telephone Encounter (Signed)
Called pt to follow up on her diarrhea and fatigue. Pt spoke wit Dawn this morning and was advised to drink plenty of fluids, check temperature, good hand hygiene, and to stay on a bland diet until diarrhea resolves. Reinforced all instructions that Dawn advised pt to do. Told pt to monitor bleeding closely. Pt reports small blood tinged blood when expectorating from her trache. Advised pt to report any other signs or worsening of bleeding since pt is on coumadin. Pt verbalized understanding.

## 2017-04-25 ENCOUNTER — Telehealth: Payer: Self-pay

## 2017-04-25 ENCOUNTER — Encounter: Payer: Self-pay | Admitting: *Deleted

## 2017-04-25 ENCOUNTER — Other Ambulatory Visit: Payer: Self-pay | Admitting: *Deleted

## 2017-04-25 ENCOUNTER — Other Ambulatory Visit (HOSPITAL_COMMUNITY)
Admission: AD | Admit: 2017-04-25 | Discharge: 2017-04-25 | Disposition: A | Discharge status: Home / Self Care | Payer: PPO | Source: Ambulatory Visit | Attending: Hematology and Oncology | Admitting: Hematology and Oncology

## 2017-04-25 ENCOUNTER — Telehealth: Payer: Self-pay | Admitting: *Deleted

## 2017-04-25 ENCOUNTER — Telehealth: Payer: Self-pay | Admitting: Hematology and Oncology

## 2017-04-25 ENCOUNTER — Inpatient Hospital Stay: Payer: PPO | Attending: Internal Medicine | Admitting: *Deleted

## 2017-04-25 ENCOUNTER — Ambulatory Visit (HOSPITAL_BASED_OUTPATIENT_CLINIC_OR_DEPARTMENT_OTHER): Payer: PPO | Admitting: Hematology and Oncology

## 2017-04-25 ENCOUNTER — Other Ambulatory Visit (HOSPITAL_BASED_OUTPATIENT_CLINIC_OR_DEPARTMENT_OTHER): Payer: PPO

## 2017-04-25 DIAGNOSIS — C50211 Malignant neoplasm of upper-inner quadrant of right female breast: Secondary | ICD-10-CM | POA: Diagnosis not present

## 2017-04-25 DIAGNOSIS — D649 Anemia, unspecified: Secondary | ICD-10-CM | POA: Diagnosis not present

## 2017-04-25 DIAGNOSIS — Z170 Estrogen receptor positive status [ER+]: Secondary | ICD-10-CM

## 2017-04-25 DIAGNOSIS — Z95828 Presence of other vascular implants and grafts: Secondary | ICD-10-CM

## 2017-04-25 DIAGNOSIS — D6959 Other secondary thrombocytopenia: Secondary | ICD-10-CM

## 2017-04-25 LAB — PROTIME-INR
INR: 5.45 — AB
PROTHROMBIN TIME: 51.3 s — AB (ref 11.4–15.2)

## 2017-04-25 LAB — CBC WITH DIFFERENTIAL/PLATELET
BASO%: 2.6 % — AB (ref 0.0–2.0)
Basophils Absolute: 0.1 10*3/uL (ref 0.0–0.1)
EOS%: 0.4 % (ref 0.0–7.0)
Eosinophils Absolute: 0 10*3/uL (ref 0.0–0.5)
HCT: 21.3 % — ABNORMAL LOW (ref 34.8–46.6)
HEMOGLOBIN: 7 g/dL — AB (ref 11.6–15.9)
LYMPH#: 1.5 10*3/uL (ref 0.9–3.3)
LYMPH%: 54.3 % — ABNORMAL HIGH (ref 14.0–49.7)
MCH: 30.4 pg (ref 25.1–34.0)
MCHC: 32.9 g/dL (ref 31.5–36.0)
MCV: 92.6 fL (ref 79.5–101.0)
MONO#: 0.3 10*3/uL (ref 0.1–0.9)
MONO%: 9.4 % (ref 0.0–14.0)
NEUT#: 0.9 10*3/uL — ABNORMAL LOW (ref 1.5–6.5)
NEUT%: 33.3 % — AB (ref 38.4–76.8)
NRBC: 23 % — AB (ref 0–0)
Platelets: 41 10*3/uL — ABNORMAL LOW (ref 145–400)
RBC: 2.3 10*6/uL — ABNORMAL LOW (ref 3.70–5.45)
RDW: 21.3 % — AB (ref 11.2–14.5)
WBC: 2.7 10*3/uL — AB (ref 3.9–10.3)

## 2017-04-25 LAB — COMPREHENSIVE METABOLIC PANEL
ALT: 27 U/L (ref 0–55)
AST: 97 U/L — AB (ref 5–34)
Albumin: 3.3 g/dL — ABNORMAL LOW (ref 3.5–5.0)
Alkaline Phosphatase: 201 U/L — ABNORMAL HIGH (ref 40–150)
Anion Gap: 10 mEq/L (ref 3–11)
BUN: 30.6 mg/dL — AB (ref 7.0–26.0)
CHLORIDE: 110 meq/L — AB (ref 98–109)
CO2: 18 mEq/L — ABNORMAL LOW (ref 22–29)
Calcium: 10.2 mg/dL (ref 8.4–10.4)
Creatinine: 2.1 mg/dL — ABNORMAL HIGH (ref 0.6–1.1)
EGFR: 28 mL/min/{1.73_m2} — ABNORMAL LOW (ref >90)
GLUCOSE: 89 mg/dL (ref 70–140)
POTASSIUM: 4.5 meq/L (ref 3.5–5.1)
SODIUM: 137 meq/L (ref 136–145)
Total Bilirubin: 0.34 mg/dL (ref 0.20–1.20)
Total Protein: 7.5 g/dL (ref 6.4–8.3)

## 2017-04-25 LAB — PREPARE RBC (CROSSMATCH)

## 2017-04-25 LAB — TECHNOLOGIST REVIEW

## 2017-04-25 MED ORDER — SODIUM CHLORIDE 0.9% FLUSH
10.0000 mL | INTRAVENOUS | Status: DC | PRN
  Administered 2017-04-25: 10 mL via INTRAVENOUS
  Filled 2017-04-25: qty 10

## 2017-04-25 MED ORDER — HEPARIN SOD (PORK) LOCK FLUSH 100 UNIT/ML IV SOLN
500.0000 [IU] | Freq: Once | INTRAVENOUS | Status: AC
  Administered 2017-04-25: 500 [IU] via INTRAVENOUS

## 2017-04-25 NOTE — Progress Notes (Unsigned)
Protime and INR results given to Dr. Lindi Adie by Nadara Mustard at [phone] 04/25/17.

## 2017-04-25 NOTE — Assessment & Plan Note (Signed)
Right mastectomy 12/13/2016: Multifocal tumor largest 2.3 cm, remaining 6 foci 0.3-1.2 centimeters, IDC with papillary features and smaller foci of IDC with lobular features grade 2, 19/20 lymph nodes positive, ER 100%, PR 100%, HER-2 negative, Ki-67 15%; repeat testing on the final path EF 30%, PR 0%, HER-2 negative ratio 1.67, Ki-67 20% RCB class 3 T2N3a Stage 3C  07/16/2016: PET/CT scan: Right breast inflammatory breast cancer, T7 osseous metastases, indeterminate focus medial left breast corresponding to 7 mm calcified soft tissue nodule, right adrenal adenoma  Clinical stage: T4 N1M1stage IV  Treatment summary: 1. Neoadjuvant chemotherapy with TaxotereCytoxan 4 (because patient has history of cardiac problems and has a pacemaker); 07/19/2016 to 09/23/2016 2. Rt mastectomy with axillary node dissection 12/13/2016 3. Consideration foradjuvant radiation including radiation to T7 vertebral body: Patient refused  ----------------------------------------------------------------------------------------------------------------------------------------- Goal of treatment: Palliation and control of disease Treatment plan: 1. Patient was not keen on receiving adjuvant radiation. 2. PET CT scan: 3 bone metastases identified. T7, L5, right inferior pubic ramus 3. Current treatment: letrozole 2.5 mg daily with Palbociclib.Started 01/21/2017  Toxicities: 1. Diarrhea that lasted 3-4 days (could be due to some of the juices that she is drinking )  2. Nausea for 2 or 3 days : Resolved 3.Neutropenia: Lab work revealed an [place] of 1.5. Continue with Palbociclib 125 mg.   Bone scan: 04/17/2017: Abnormal increased localization of the frontal calvarium bilaterally nonspecific could be metastases. Otherwise stable findings  Plan: Continue with the current treatment  Return to clinic in one month for follow-up with labs

## 2017-04-25 NOTE — Telephone Encounter (Signed)
Called pt lvm to let her know about her appt at [facility] for 2units blood transfusion and to let her know that her inr was 5.4. Pt instructed to hold coumadin x3 days. Pt to call and confirm that she received this message. Call back number provided in vm.

## 2017-04-25 NOTE — Telephone Encounter (Signed)
Tried home number and unable to lvm. Left messag on mobile number to inform pt of 7/26 appt at 215 per LOS

## 2017-04-25 NOTE — Progress Notes (Signed)
[name], MD as PCP - General (Family Medicine) [name], MD as Consulting Physician (Cardiology)  DIAGNOSIS:  Encounter Diagnosis  Name Primary?  . Malignant neoplasm of upper-inner quadrant of right breast in female, estrogen receptor positive ([place])     SUMMARY OF ONCOLOGIC HISTORY:   Breast cancer of upper-inner quadrant of right female breast ([place])   06/15/2016 Mammogram    Right breast irregular mass UIQ with diffuse skin thickening that is palpable, additional pleomorphic calcifications 2.8 x 2.1 x 3.5 cm extending posteriorly for another 2 cm; left breast 8 mm circumscribed nodule       06/21/2016 Initial Diagnosis    Right breast biopsy UIQ: IDC grade 2 with papillary features ER 100%, PR 100%, Ki-67 15%, HER-2 negative ratio 1.21; right axillary LN biopsy metastatic carcinoma in one lymph node with ECE, ER 45%, PR 0%, Ki-67 20%, HER-2 negative ratio 1.86      06/28/2016 Procedure    Left breast biopsy: Fibroadenoma      07/16/2016 PET scan    Right breast primary inflammatory breast cancer, right axillary lymph node metastases, T7 bone metastases, indeterminate focus in the left medial breast 7 mm, right adrenal nodule      07/19/2016 - 09/20/2016 Neo-Adjuvant Chemotherapy    Neoadjuvant Taxotere and Cytoxan every 3 weeks 4 cycles      12/13/2016 Surgery    Right mastectomy: Multifocal tumor largest 2.3 cm, remaining 6 foci 0.3-1.2 centimeters, IDC with papillary features and smaller foci of IDC with lobular features grade 2, 19/20 lymph nodes positive, ER 100%, PR 100%, HER-2 negative, Ki-67 15%; repeat testing on the final path EF 30%, PR 0%, HER-2 negative ratio 1.67, Ki-67 20% RCB class 3 T2N3a Stage 3C      01/14/2017 PET scan    Multifocal hypermetabolic bone metastases within the T7 vertebra SUV 10.7. New uptake within L5 SUV 11. Previously known uptake at right inferior pubic ramus SUV 8.2.      01/21/2017 -  Anti-estrogen oral  therapy    Palbociclib with Letrozole       Malignant neoplasm of upper-outer quadrant of right breast in female, estrogen receptor positive ([place])    CHIEF COMPLIANT: Severe pancytopenia due to Palbociclib, increased INR  INTERVAL HISTORY: [name] is a 66 year old with above-mentioned history of metastatic breast cancer currently on Palbociclib and letrozole. She is here to discuss the results of the bone scan. She tells me that overall she feels letrozole. Her treatment was held last week because of severe cytopenias. She complains of feeling tired and short of breath with minimal exertion. She was noted to have severe anemia with hemoglobin of 9. Platelet count continued to decline and some 40,000 today. Denies any bleeding or bruising.. Denies any fevers or chills.  REVIEW OF SYSTEMS:   Constitutional: Denies fevers, chills or abnormal weight loss Eyes: Denies blurriness of vision Ears, nose, mouth, throat, and face: Denies mucositis or sore throat Respiratory: Shortness of breath to exertion Cardiovascular: Denies palpitation, chest discomfort Gastrointestinal:  Denies nausea, heartburn or change in bowel habits Skin: Denies abnormal skin rashes Lymphatics: Denies new lymphadenopathy or easy bruising Neurological:Denies numbness, tingling or new weaknesses Behavioral/Psych: Mood is stable, no new changes  Extremities: No lower extremity edema Breast:  denies any pain or lumps or nodules in either breasts All other systems were reviewed with the patient and are negative.  I have reviewed the past medical history, past surgical history, social history  and family history with the patient and they are unchanged from previous note.  ALLERGIES:  is allergic to lisinopril and eggs or egg-derived products.  MEDICATIONS:  Current Outpatient Prescriptions  Medication Sig Dispense Refill  . cholecalciferol (VITAMIN D) 1000 UNITS tablet Take 1,000 Units by mouth daily.    Marland Kitchen  gabapentin (NEURONTIN) 300 MG capsule TAKE 1 CAPSULE (300 MG TOTAL) BY MOUTH 3 (THREE) TIMES DAILY. 90 capsule 2  . hydrochlorothiazide (HYDRODIURIL) 12.5 MG tablet TAKE 1 TABLET (12.5 MG TOTAL) BY MOUTH DAILY. 90 tablet 1  . latanoprost (XALATAN) 0.005 % ophthalmic solution Place 1 drop into both eyes at bedtime.     Marland Kitchen letrozole (FEMARA) 2.5 MG tablet Take 1 tablet (2.5 mg total) by mouth daily. 90 tablet 3  . lidocaine-prilocaine (EMLA) cream Apply to affected area once (Patient taking differently: Apply 1 application topically daily as needed (prior to port access). Apply to affected area once) 30 g 3  . losartan (COZAAR) 100 MG tablet Take 1 tablet (100 mg total) by mouth daily. (Patient taking differently: Take 100 mg by mouth at bedtime. ) 30 tablet 5  . ondansetron (ZOFRAN) 8 MG tablet Take 1 tablet (8 mg total) by mouth 2 (two) times daily as needed for refractory nausea / vomiting. Start on day 3 after chemo. 30 tablet 1  . oxyCODONE (OXY IR/ROXICODONE) 5 MG immediate release tablet Take 1-2 tablets (5-10 mg total) by mouth every 4 (four) hours as needed for moderate pain. 40 tablet 0  . palbociclib (IBRANCE) 125 MG capsule Take 1 capsule (125 mg total) by mouth daily with breakfast. Take whole with food. 21 capsule 1  . pantoprazole (PROTONIX) 40 MG tablet TAKE 1 TABLET BY MOUTH EVERY DAY 90 tablet 0  . potassium chloride (KLOR-CON M10) 10 MEQ tablet Take 1 tablet (10 mEq total) by mouth 3 (three) times daily. 270 tablet 1  . prochlorperazine (COMPAZINE) 10 MG tablet Take 1 tablet (10 mg total) by mouth every 6 (six) hours as needed for nausea or vomiting. 30 tablet 3  . promethazine (PHENERGAN) 12.5 MG tablet Take 12.5 mg by mouth every 6 (six) hours as needed for nausea or vomiting.    . tizanidine (ZANAFLEX) 2 MG capsule Take 1-2 capsules (2-4 mg total) by mouth every 8 (eight) hours as needed for muscle spasms. 40 capsule 1  . triamcinolone cream (KENALOG) 0.1 % Apply 1 application  topically 2 (two) times daily. If needed (Patient taking differently: Apply 1 application topically 2 (two) times daily as needed (for dry/itchy skin or rash.). If needed) 45 g 1  . warfarin (COUMADIN) 4 MG tablet Take 3 tablets (12 mg total) by mouth daily. Or as otherwise directed by your doctor 270 tablet 1   No current facility-administered medications for this visit.     PHYSICAL EXAMINATION: ECOG PERFORMANCE STATUS: 2 - Symptomatic, <50% confined to bed  Vitals:   04/25/17 1218  BP: (!) 154/95  Pulse: 78  Resp: 19  Temp: (!) 97.5 F (36.4 C)   Filed Weights   04/25/17 1218  Weight: 230 lb 12.8 oz (104.7 kg)    GENERAL:alert, no distress and comfortable SKIN: skin color, texture, turgor are normal, no rashes or significant lesions EYES: normal, Conjunctiva are pink and non-injected, sclera clear OROPHARYNX:no exudate, no erythema and lips, buccal mucosa, and tongue normal  NECK: supple, thyroid normal size, non-tender, without nodularity LYMPH:  no palpable lymphadenopathy in the cervical, axillary or inguinal LUNGS: clear to auscultation and  percussion with normal breathing effort HEART: regular rate & rhythm and no murmurs and no lower extremity edema ABDOMEN:abdomen soft, non-tender and normal bowel sounds MUSCULOSKELETAL:no cyanosis of digits and no clubbing  NEURO: alert & oriented x 3 with fluent speech, no focal motor/sensory deficits EXTREMITIES: No lower extremity edema  LABORATORY DATA:  I have reviewed the data as listed   Chemistry      Component Value Date/Time   NA 137 04/25/2017 1132   K 4.5 04/25/2017 1132   CL 107 12/17/2016 0414   CO2 18 (L) 04/25/2017 1132   BUN 30.6 (H) 04/25/2017 1132   CREATININE 2.1 (H) 04/25/2017 1132   GLU 98 11/26/2014      Component Value Date/Time   CALCIUM 10.2 04/25/2017 1132   ALKPHOS 201 (H) 04/25/2017 1132   AST 97 (H) 04/25/2017 1132   ALT 27 04/25/2017 1132   BILITOT 0.34 04/25/2017 1132       Lab  Results  Component Value Date   WBC 2.7 (L) 04/25/2017   HGB 7.0 (L) 04/25/2017   HCT 21.3 (L) 04/25/2017   MCV 92.6 04/25/2017   PLT 41 (L) 04/25/2017   NEUTROABS 0.9 (L) 04/25/2017    ASSESSMENT & PLAN:  Breast cancer of upper-inner quadrant of right female breast ([place]) Right mastectomy 12/13/2016: Multifocal tumor largest 2.3 cm, remaining 6 foci 0.3-1.2 centimeters, IDC with papillary features and smaller foci of IDC with lobular features grade 2, 19/20 lymph nodes positive, ER 100%, PR 100%, HER-2 negative, Ki-67 15%; repeat testing on the final path EF 30%, PR 0%, HER-2 negative ratio 1.67, Ki-67 20% RCB class 3 T2N3a Stage 3C  07/16/2016: PET/CT scan: Right breast inflammatory breast cancer, T7 osseous metastases, indeterminate focus medial left breast corresponding to 7 mm calcified soft tissue nodule, right adrenal adenoma  Clinical stage: T4 N1M1stage IV  Treatment summary: 1. Neoadjuvant chemotherapy with TaxotereCytoxan 4 (because patient has history of cardiac problems and has a pacemaker); 07/19/2016 to 09/23/2016 2. Rt mastectomy with axillary node dissection 12/13/2016 3. Consideration foradjuvant radiation including radiation to T7 vertebral body: Patient refused  ----------------------------------------------------------------------------------------------------------------------------------------- Goal of treatment: Palliation and control of disease Treatment plan: 1. Patient was not keen on receiving adjuvant radiation. 2. PET CT scan: 3 bone metastases identified. T7, L5, right inferior pubic ramus 3. Current treatment: letrozole 2.5 mg daily with Palbociclib.Started 01/21/2017  Toxicities: 1. Diarrhea that lasted 3-4 days (could be due to some of the juices that she is drinking )  2. Nausea for 2 or 3 days : Resolved 3.Neutropenia: Very severe neutropenia since improving off of Palbociclib. Holding treatment for now. Return to clinic in one week for  recheck of the CBC. If her counts improve then we will reduce the dosage of Palbociclib 200 mg daily. 4. Severe anemia causing shortness of breath: 2 units of packed red cells to begin at [facility].   Bone scan: 04/17/2017: Abnormal increased localization of the frontal calvarium bilaterally nonspecific could be metastases.  I suspect that these are benign findings. I did not recommend doing a CT scan.   Elevated INR: 5.4  Plan:Return to clinic in one week for blood count check.   I spent 25 minutes talking to the patient of which more than half was spent in counseling and coordination of care.  No orders of the defined types were placed in this encounter.  The patient has a good understanding of the overall plan. she agrees with it. she will call with any problems that may  develop before the next visit here.   Rulon Eisenmenger, MD 04/25/17

## 2017-04-25 NOTE — Telephone Encounter (Signed)
Patient added to clinic schedule today for lab only per Dr. Rogue Bussing. Dr. B spoke with Dr. Lindi Adie. Pt needs 2 units of blood-symptomatic anemia tomorrow. Premeds per Dr. Jacinto Reap tylenol/benadryl. Blood bank Orders entered per md order. Due to acuity in cancer center on 04/26/17, patient will need to go to [facility] for infusion. I personally spoke with Jackelyn Poling in [place] and provided a hand off on patient care.  [facility] could accommodate pt at 7:30am tomorrow.  I spoke with patient she is agreeable to come tomorrow to [facility] for blood transfusion; however due to transportation concerns, she is not able to arrive until 9 or 930 am.  Jackelyn Poling made aware in [facility] of patient's requested arrival time. Pt voiced concerns to RN about being stuck "6 times in lab today to obtain a sample." She requested that RN access her port for today's blood bank labs.  Per pt's request, will have port accessed today due to poor peripheral vein access. Per pt's request  -  Port site will remain accessed in preparation for tomorrow. I also informed Jackelyn Poling that pt's port will not be de-accessed today.

## 2017-04-26 ENCOUNTER — Ambulatory Visit
Admission: RE | Admit: 2017-04-26 | Discharge: 2017-04-26 | Disposition: A | Discharge status: Home / Self Care | Payer: PPO | Source: Ambulatory Visit | Attending: Internal Medicine | Admitting: Internal Medicine

## 2017-04-26 DIAGNOSIS — D649 Anemia, unspecified: Secondary | ICD-10-CM | POA: Diagnosis not present

## 2017-04-26 MED ORDER — HEPARIN SOD (PORK) LOCK FLUSH 100 UNIT/ML IV SOLN
INTRAVENOUS | Status: AC
  Filled 2017-04-26: qty 5

## 2017-04-26 MED ORDER — SODIUM CHLORIDE FLUSH 0.9 % IV SOLN
INTRAVENOUS | Status: AC
  Filled 2017-04-26: qty 10

## 2017-04-26 MED ORDER — DIPHENHYDRAMINE HCL 25 MG PO CAPS
25.0000 mg | ORAL_CAPSULE | Freq: Once | ORAL | Status: AC
  Administered 2017-04-26: 25 mg via ORAL

## 2017-04-26 MED ORDER — SODIUM CHLORIDE 0.9 % IV SOLN: 250.0000 mL | Freq: Once | INTRAVENOUS | Status: DC

## 2017-04-26 MED ORDER — SODIUM CHLORIDE 0.9 % IV SOLN
INTRAVENOUS | Status: DC
  Administered 2017-04-26: 10:00:00 via INTRAVENOUS

## 2017-04-26 MED ORDER — ACETAMINOPHEN 325 MG PO TABS
650.0000 mg | ORAL_TABLET | Freq: Once | ORAL | Status: AC
  Administered 2017-04-26: 650 mg via ORAL

## 2017-04-26 MED ORDER — SODIUM CHLORIDE 0.9% FLUSH
10.0000 mL | INTRAVENOUS | Status: AC | PRN
  Administered 2017-04-26: 10 mL

## 2017-04-26 MED ORDER — ACETAMINOPHEN 325 MG PO TABS
ORAL_TABLET | ORAL | Status: AC
  Filled 2017-04-26: qty 2

## 2017-04-26 MED ORDER — HEPARIN SOD (PORK) LOCK FLUSH 100 UNIT/ML IV SOLN
500.0000 [IU] | Freq: Every day | INTRAVENOUS | Status: AC | PRN
  Administered 2017-04-26: 500 [IU]

## 2017-04-26 MED ORDER — DIPHENHYDRAMINE HCL 25 MG PO CAPS
ORAL_CAPSULE | ORAL | Status: AC
  Filled 2017-04-26: qty 1

## 2017-04-26 MED ORDER — SODIUM CHLORIDE FLUSH 0.9 % IV SOLN
INTRAVENOUS | Status: AC
  Administered 2017-04-26: 10 mL
  Filled 2017-04-26: qty 10

## 2017-04-26 NOTE — Progress Notes (Signed)
No signs of adverse reaction. Pt discharge by wheelchair condition stable. Accompanied by friend [name].

## 2017-04-26 NOTE — Discharge Instructions (Signed)
Blood Transfusion, Adult, Care After This sheet gives you information about how to care for yourself after your procedure. Your health care provider may also give you more specific instructions. If you have problems or questions, contact your health care provider. What can I expect after the procedure? After your procedure, it is common to have:  Bruising and soreness where the IV tube was inserted.  Headache.  Follow these instructions at home:  Take over-the-counter and prescription medicines only as told by your health care provider.  Return to your normal activities as told by your health care provider.  Follow instructions from your health care provider about how to take care of your IV insertion site. Make sure you: ? Wash your hands with soap and water before you change your bandage (dressing). If soap and water are not available, use hand sanitizer. ? Change your dressing as told by your health care provider.  Check your IV insertion site every day for signs of infection. Check for: ? More redness, swelling, or pain. ? More fluid or blood. ? Warmth. ? Pus or a bad smell. Contact a health care provider if:  You have more redness, swelling, or pain around the IV insertion site.  You have more fluid or blood coming from the IV insertion site.  Your IV insertion site feels warm to the touch.  You have pus or a bad smell coming from the IV insertion site.  Your urine turns pink, red, or brown.  You feel weak after doing your normal activities. Get help right away if:  You have signs of a serious allergic or immune system reaction, including: ? Itchiness. ? Hives. ? Trouble breathing. ? Anxiety. ? Chest or lower back pain. ? Fever, flushing, and chills. ? Rapid pulse. ? Rash. ? Diarrhea. ? Vomiting. ? Dark urine. ? Serious headache. ? Dizziness. ? Stiff neck. ? Yellow coloration of the face or the white parts of the eyes (jaundice). This information is not  intended to replace advice given to you by your health care provider. Make sure you discuss any questions you have with your health care provider. Document Released: 10/15/2014 Document Revised: 05/23/2016 Document Reviewed: 04/09/2016 Elsevier Interactive Patient Education  2018 [employer]  

## 2017-04-27 LAB — BPAM RBC
BLOOD PRODUCT EXPIRATION DATE: 201808022359
BLOOD PRODUCT EXPIRATION DATE: 201808072359
Blood Product Expiration Date: 201808072359
ISSUE DATE / TIME: 201807201012
ISSUE DATE / TIME: 201807201256
UNIT TYPE AND RH: 1700
UNIT TYPE AND RH: 6200
Unit Type and Rh: 6200

## 2017-04-27 LAB — TYPE AND SCREEN
ABO/RH(D): AB POS
ANTIBODY SCREEN: NEGATIVE
UNIT DIVISION: 0
Unit division: 0
Unit division: 0

## 2017-04-29 ENCOUNTER — Telehealth: Payer: Self-pay

## 2017-04-29 ENCOUNTER — Encounter: Payer: Self-pay | Admitting: Family Medicine

## 2017-04-29 LAB — PROTIME-INR: INR: 1.5 — AB (ref 0.9–1.1)

## 2017-04-29 NOTE — Telephone Encounter (Signed)
Patient states after she got off the phone with me she checked it and it was 1.5 around 12:00pm.

## 2017-04-29 NOTE — Telephone Encounter (Signed)
Pt would like to reschedule her appt this week. She states that she has an appt with a different dr on Thursday and wants to know if she can move to wed. Rescheduled pt request for wed 05/01/17. Pt stopped her coumadin per Dr.Gudena instructions, starting last Friday. Pt checked her blood work and her inr was 1.5 today. Pt may resume her 10mg  coumadin. No complaints of bleeding this time. Pt does report headaches, and abdominal cramping. Pt states that she received 2 unit of RBC at [facility] on Friday last week. Pt does not seem to feel any improvement at this time. Encouraged pt to drink fluids, ensure for protein supplement, and to monitor for bleeding. Pt states that she has been moving her bowels okay, but has had abdominal cramping for a few days, which is now resolving slowly. Told pt to try pepto bismol and take advil or tylenol for the headache. Pt encouraged to eat small meals throughout the day and to check her temperature to monitor signs of dehydration or infection. Pt verbalized understanding and also confirmed time/date of this week's appt. Pt has no further concerns at this time and Dr.Gudena aware of pt resuming coumadin 10mg .

## 2017-04-29 NOTE — Telephone Encounter (Signed)
Pt states she went to the cancer center Thursday 04/25/2017 her pt/inr was 7.5 and they told her to stay off the coumadin until today.  On Friday 04/26/2017 they gave her 2 units of blood and she still not feeling to well.

## 2017-04-29 NOTE — Telephone Encounter (Signed)
Please ask her to get an INR TODAY Thank you

## 2017-04-29 NOTE — Telephone Encounter (Signed)
Already responded to; see paper INR

## 2017-04-29 NOTE — Telephone Encounter (Signed)
na

## 2017-05-01 ENCOUNTER — Ambulatory Visit (HOSPITAL_BASED_OUTPATIENT_CLINIC_OR_DEPARTMENT_OTHER): Payer: PPO | Admitting: Hematology and Oncology

## 2017-05-01 ENCOUNTER — Ambulatory Visit (HOSPITAL_BASED_OUTPATIENT_CLINIC_OR_DEPARTMENT_OTHER): Payer: PPO

## 2017-05-01 ENCOUNTER — Other Ambulatory Visit (HOSPITAL_BASED_OUTPATIENT_CLINIC_OR_DEPARTMENT_OTHER): Payer: PPO

## 2017-05-01 ENCOUNTER — Other Ambulatory Visit: Payer: PPO

## 2017-05-01 ENCOUNTER — Encounter: Payer: Self-pay | Admitting: Hematology and Oncology

## 2017-05-01 VITALS — BP 160/82 | HR 74 | Temp 97.6°F | Resp 17 | Ht 64.0 in | Wt 229.2 lb

## 2017-05-01 DIAGNOSIS — D509 Iron deficiency anemia, unspecified: Secondary | ICD-10-CM | POA: Diagnosis not present

## 2017-05-01 DIAGNOSIS — C7951 Secondary malignant neoplasm of bone: Secondary | ICD-10-CM

## 2017-05-01 DIAGNOSIS — Z170 Estrogen receptor positive status [ER+]: Secondary | ICD-10-CM

## 2017-05-01 DIAGNOSIS — C50211 Malignant neoplasm of upper-inner quadrant of right female breast: Secondary | ICD-10-CM

## 2017-05-01 DIAGNOSIS — Z95828 Presence of other vascular implants and grafts: Secondary | ICD-10-CM

## 2017-05-01 DIAGNOSIS — Z7901 Long term (current) use of anticoagulants: Secondary | ICD-10-CM

## 2017-05-01 LAB — CBC WITH DIFFERENTIAL/PLATELET
BASO%: 3.2 % — ABNORMAL HIGH (ref 0.0–2.0)
BASOS ABS: 0.1 10*3/uL (ref 0.0–0.1)
EOS%: 0.3 % (ref 0.0–7.0)
Eosinophils Absolute: 0 10*3/uL (ref 0.0–0.5)
HEMATOCRIT: 26.6 % — AB (ref 34.8–46.6)
HEMOGLOBIN: 8.7 g/dL — AB (ref 11.6–15.9)
LYMPH#: 1.7 10*3/uL (ref 0.9–3.3)
LYMPH%: 45.8 % (ref 14.0–49.7)
MCH: 30 pg (ref 25.1–34.0)
MCHC: 32.7 g/dL (ref 31.5–36.0)
MCV: 91.7 fL (ref 79.5–101.0)
MONO#: 0.5 10*3/uL (ref 0.1–0.9)
MONO%: 13.1 % (ref 0.0–14.0)
NEUT#: 1.4 10*3/uL — ABNORMAL LOW (ref 1.5–6.5)
NEUT%: 37.6 % — AB (ref 38.4–76.8)
PLATELETS: 19 10*3/uL — AB (ref 145–400)
RBC: 2.9 10*6/uL — ABNORMAL LOW (ref 3.70–5.45)
RDW: 19.6 % — ABNORMAL HIGH (ref 11.2–14.5)
WBC: 3.7 10*3/uL — ABNORMAL LOW (ref 3.9–10.3)
nRBC: 26 % — ABNORMAL HIGH (ref 0–0)

## 2017-05-01 LAB — COMPREHENSIVE METABOLIC PANEL
ALBUMIN: 3.3 g/dL — AB (ref 3.5–5.0)
ALK PHOS: 189 U/L — AB (ref 40–150)
ALT: 24 U/L (ref 0–55)
ANION GAP: 10 meq/L (ref 3–11)
AST: 111 U/L — ABNORMAL HIGH (ref 5–34)
BUN: 23.5 mg/dL (ref 7.0–26.0)
CALCIUM: 10.7 mg/dL — AB (ref 8.4–10.4)
CO2: 22 mEq/L (ref 22–29)
Chloride: 107 mEq/L (ref 98–109)
Creatinine: 1.8 mg/dL — ABNORMAL HIGH (ref 0.6–1.1)
EGFR: 35 mL/min/{1.73_m2} — AB (ref >90)
Glucose: 77 mg/dl (ref 70–140)
POTASSIUM: 4.2 meq/L (ref 3.5–5.1)
SODIUM: 139 meq/L (ref 136–145)
Total Bilirubin: 0.5 mg/dL (ref 0.20–1.20)
Total Protein: 7.5 g/dL (ref 6.4–8.3)

## 2017-05-01 LAB — PROTIME-INR
INR: 1.5 — AB (ref 2.00–3.50)
PROTIME: 18 s — AB (ref 10.6–13.4)

## 2017-05-01 LAB — TECHNOLOGIST REVIEW

## 2017-05-01 MED ORDER — SODIUM CHLORIDE 0.9% FLUSH
10.0000 mL | INTRAVENOUS | Status: DC | PRN
  Administered 2017-05-01: 10 mL via INTRAVENOUS
  Filled 2017-05-01: qty 10

## 2017-05-01 MED ORDER — HEPARIN SOD (PORK) LOCK FLUSH 100 UNIT/ML IV SOLN
500.0000 [IU] | Freq: Once | INTRAVENOUS | Status: AC | PRN
  Administered 2017-05-01: 500 [IU] via INTRAVENOUS
  Filled 2017-05-01: qty 5

## 2017-05-01 MED ORDER — PREDNISONE 10 MG PO TABS: 10.0000 mg | ORAL_TABLET | Freq: Every day | ORAL | 0 refills | Status: DC

## 2017-05-01 NOTE — Assessment & Plan Note (Signed)
Right mastectomy 12/13/2016: Multifocal tumor largest 2.3 cm, remaining 6 foci 0.3-1.2 centimeters, IDC with papillary features and smaller foci of IDC with lobular features grade 2, 19/20 lymph nodes positive, ER 100%, PR 100%, HER-2 negative, Ki-67 15%; repeat testing on the final path EF 30%, PR 0%, HER-2 negative ratio 1.67, Ki-67 20% RCB class 3 T2N3a Stage 3C  07/16/2016: PET/CT scan: Right breast inflammatory breast cancer, T7 osseous metastases, indeterminate focus medial left breast corresponding to 7 mm calcified soft tissue nodule, right adrenal adenoma  Clinical stage: T4 N1M1stage IV  Treatment summary: 1. Neoadjuvant chemotherapy with TaxotereCytoxan 4 (because patient has history of cardiac problems and has a pacemaker); 07/19/2016 to 09/23/2016 2. Rt mastectomy with axillary node dissection 12/13/2016 3. Consideration foradjuvant radiation including radiation to T7 vertebral body: Patient refused  ----------------------------------------------------------------------------------------------------------------------------------------- Goal of treatment: Palliation and control of disease Treatment plan: 1. Patient was not keen on receiving adjuvant radiation. 2. PET CT scan: 3 bone metastases identified. T7, L5, right inferior pubic ramus 3. Current treatment: letrozole 2.5 mg daily with Palbociclib.Started 01/21/2017 NOW ON HOLD  Severe thrombocytopenia: Platelet count 19 unclear etiology. Differential diagnosis is between bone marrow infiltration by cancer versus autoimmune causes. In spite of stopping Palbociclib patient continues to have worsened from cytopenia. 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 I recommended starting the patient on prednisone. She will take 60 mg in the morning and drop daily  By 10 mg.  Return to clinic in one week to recheck on the CBC. If his blood counts do not improve then we may have to consider bone marrow biopsy.

## 2017-05-01 NOTE — Progress Notes (Signed)
[name], MD as PCP - General (Family Medicine) [name], MD as Consulting Physician (Cardiology)  DIAGNOSIS:  Encounter Diagnoses  Name Primary?  . Malignant neoplasm of upper-inner quadrant of right breast in female, estrogen receptor positive ([place]) Yes  . Iron deficiency anemia, unspecified iron deficiency anemia type     SUMMARY OF ONCOLOGIC HISTORY:   Breast cancer of upper-inner quadrant of right female breast ([place])   06/15/2016 Mammogram    Right breast irregular mass UIQ with diffuse skin thickening that is palpable, additional pleomorphic calcifications 2.8 x 2.1 x 3.5 cm extending posteriorly for another 2 cm; left breast 8 mm circumscribed nodule       06/21/2016 Initial Diagnosis    Right breast biopsy UIQ: IDC grade 2 with papillary features ER 100%, PR 100%, Ki-67 15%, HER-2 negative ratio 1.21; right axillary LN biopsy metastatic carcinoma in one lymph node with ECE, ER 45%, PR 0%, Ki-67 20%, HER-2 negative ratio 1.86      06/28/2016 Procedure    Left breast biopsy: Fibroadenoma      07/16/2016 PET scan    Right breast primary inflammatory breast cancer, right axillary lymph node metastases, T7 bone metastases, indeterminate focus in the left medial breast 7 mm, right adrenal nodule      07/19/2016 - 09/20/2016 Neo-Adjuvant Chemotherapy    Neoadjuvant Taxotere and Cytoxan every 3 weeks 4 cycles      12/13/2016 Surgery    Right mastectomy: Multifocal tumor largest 2.3 cm, remaining 6 foci 0.3-1.2 centimeters, IDC with papillary features and smaller foci of IDC with lobular features grade 2, 19/20 lymph nodes positive, ER 100%, PR 100%, HER-2 negative, Ki-67 15%; repeat testing on the final path EF 30%, PR 0%, HER-2 negative ratio 1.67, Ki-67 20% RCB class 3 T2N3a Stage 3C      01/14/2017 PET scan    Multifocal hypermetabolic bone metastases within the T7 vertebra SUV 10.7. New uptake within L5 SUV 11. Previously known uptake at right  inferior pubic ramus SUV 8.2.      01/21/2017 -  Anti-estrogen oral therapy    Palbociclib with Letrozole       Malignant neoplasm of upper-outer quadrant of right breast in female, estrogen receptor positive ([place])    CHIEF COMPLIANT: Patient received blood transfusion last week, felt very tired and weak for several days  INTERVAL HISTORY: [name] is a 66 year old with above-mentioned history metastatic breast cancer who previously received Palbociclib and letrozole from April 2018 on the wrap to June 2018. She has been off this therapy for the past 3 weeks. In spite of that her platelet count continues to dip. Today her platelet counts and 19. Patient denies any bruising or bleeding. He also takes anticoagulation with Coumadin. This is being managed by her primary care physician. She has not noticed any blood loss in the stool. No evidence of any blood clot. Patient is extremely emotional and sad today because she is going to have to take prednisone. She apparently took prednisone for 17 years and vowed not to take that ever again in her life.  REVIEW OF SYSTEMS:   Constitutional: Denies fevers, chills or abnormal weight loss Eyes: Denies blurriness of vision Ears, nose, mouth, throat, and face: Denies mucositis or sore throat Respiratory: Denies cough, dyspnea or wheezes Cardiovascular: Denies palpitation, chest discomfort Gastrointestinal:  Denies nausea, heartburn or change in bowel habits Skin: Denies abnormal skin rashes Lymphatics: Denies new lymphadenopathy or easy bruising Neurological:Denies  numbness, tingling or new weaknesses Behavioral/Psych: Mood is stable, no new changes  Extremities: No lower extremity edema All other systems were reviewed with the patient and are negative.  I have reviewed the past medical history, past surgical history, social history and family history with the patient and they are unchanged from previous note.  ALLERGIES:  is allergic to  lisinopril and eggs or egg-derived products.  MEDICATIONS:  Current Outpatient Prescriptions  Medication Sig Dispense Refill  . cholecalciferol (VITAMIN D) 1000 UNITS tablet Take 1,000 Units by mouth daily.    Marland Kitchen gabapentin (NEURONTIN) 300 MG capsule TAKE 1 CAPSULE (300 MG TOTAL) BY MOUTH 3 (THREE) TIMES DAILY. 90 capsule 2  . hydrochlorothiazide (HYDRODIURIL) 12.5 MG tablet TAKE 1 TABLET (12.5 MG TOTAL) BY MOUTH DAILY. 90 tablet 1  . latanoprost (XALATAN) 0.005 % ophthalmic solution Place 1 drop into both eyes at bedtime.     Marland Kitchen letrozole (FEMARA) 2.5 MG tablet Take 1 tablet (2.5 mg total) by mouth daily. 90 tablet 3  . lidocaine-prilocaine (EMLA) cream Apply to affected area once (Patient taking differently: Apply 1 application topically daily as needed (prior to port access). Apply to affected area once) 30 g 3  . losartan (COZAAR) 100 MG tablet Take 1 tablet (100 mg total) by mouth daily. (Patient taking differently: Take 100 mg by mouth at bedtime. ) 30 tablet 5  . ondansetron (ZOFRAN) 8 MG tablet Take 1 tablet (8 mg total) by mouth 2 (two) times daily as needed for refractory nausea / vomiting. Start on day 3 after chemo. 30 tablet 1  . oxyCODONE (OXY IR/ROXICODONE) 5 MG immediate release tablet Take 1-2 tablets (5-10 mg total) by mouth every 4 (four) hours as needed for moderate pain. 40 tablet 0  . palbociclib (IBRANCE) 125 MG capsule Take 1 capsule (125 mg total) by mouth daily with breakfast. Take whole with food. (Patient not taking: Reported on 04/26/2017) 21 capsule 1  . pantoprazole (PROTONIX) 40 MG tablet TAKE 1 TABLET BY MOUTH EVERY DAY 90 tablet 0  . potassium chloride (KLOR-CON M10) 10 MEQ tablet Take 1 tablet (10 mEq total) by mouth 3 (three) times daily. 270 tablet 1  . predniSONE (DELTASONE) 10 MG tablet Take 1 tablet (10 mg total) by mouth daily with breakfast. 6 tabs day 1, 5 day 2; 4 on day 3; 3 on day 4; 2 on day 5; 1 on day 6, 7 50 tablet 0  . prochlorperazine (COMPAZINE) 10  MG tablet Take 1 tablet (10 mg total) by mouth every 6 (six) hours as needed for nausea or vomiting. 30 tablet 3  . promethazine (PHENERGAN) 12.5 MG tablet Take 12.5 mg by mouth every 6 (six) hours as needed for nausea or vomiting.    . tizanidine (ZANAFLEX) 2 MG capsule Take 1-2 capsules (2-4 mg total) by mouth every 8 (eight) hours as needed for muscle spasms. 40 capsule 1  . triamcinolone cream (KENALOG) 0.1 % Apply 1 application topically 2 (two) times daily. If needed (Patient not taking: Reported on 04/26/2017) 45 g 1  . warfarin (COUMADIN) 4 MG tablet Take 3 tablets (12 mg total) by mouth daily. Or as otherwise directed by your doctor 270 tablet 1   No current facility-administered medications for this visit.     PHYSICAL EXAMINATION: ECOG PERFORMANCE STATUS: 1 - Symptomatic but completely ambulatory  Vitals:   05/01/17 1450  BP: (!) 160/82  Pulse: 74  Resp: 17  Temp: 97.6 F (36.4 C)   Filed Weights  05/01/17 1450  Weight: 229 lb 3.2 oz (104 kg)    GENERAL:alert, no distress and comfortable SKIN: skin color, texture, turgor are normal, no rashes or significant lesions EYES: normal, Conjunctiva are pink and non-injected, sclera clear OROPHARYNX:no exudate, no erythema and lips, buccal mucosa, and tongue normal  NECK: supple, thyroid normal size, non-tender, without nodularity LYMPH:  no palpable lymphadenopathy in the cervical, axillary or inguinal LUNGS: clear to auscultation and percussion with normal breathing effort HEART: regular rate & rhythm and no murmurs and no lower extremity edema ABDOMEN:abdomen soft, non-tender and normal bowel sounds MUSCULOSKELETAL:no cyanosis of digits and no clubbing  NEURO: alert & oriented x 3 with fluent speech, no focal motor/sensory deficits EXTREMITIES: No lower extremity edema  LABORATORY DATA:  I have reviewed the data as listed   Chemistry      Component Value Date/Time   NA 139 05/01/2017 1400   K 4.2 05/01/2017 1400   CL  107 12/17/2016 0414   CO2 22 05/01/2017 1400   BUN 23.5 05/01/2017 1400   CREATININE 1.8 (H) 05/01/2017 1400   GLU 98 11/26/2014      Component Value Date/Time   CALCIUM 10.7 (H) 05/01/2017 1400   ALKPHOS 189 (H) 05/01/2017 1400   AST 111 (H) 05/01/2017 1400   ALT 24 05/01/2017 1400   BILITOT 0.50 05/01/2017 1400       Lab Results  Component Value Date   WBC 3.7 (L) 05/01/2017   HGB 8.7 (L) 05/01/2017   HCT 26.6 (L) 05/01/2017   MCV 91.7 05/01/2017   PLT 19 (L) 05/01/2017   NEUTROABS 1.4 (L) 05/01/2017    ASSESSMENT & PLAN:  Breast cancer of upper-inner quadrant of right female breast ([place]) Right mastectomy 12/13/2016: Multifocal tumor largest 2.3 cm, remaining 6 foci 0.3-1.2 centimeters, IDC with papillary features and smaller foci of IDC with lobular features grade 2, 19/20 lymph nodes positive, ER 100%, PR 100%, HER-2 negative, Ki-67 15%; repeat testing on the final path EF 30%, PR 0%, HER-2 negative ratio 1.67, Ki-67 20% RCB class 3 T2N3a Stage 3C  07/16/2016: PET/CT scan: Right breast inflammatory breast cancer, T7 osseous metastases, indeterminate focus medial left breast corresponding to 7 mm calcified soft tissue nodule, right adrenal adenoma  Clinical stage: T4 N1M1stage IV  Treatment summary: 1. Neoadjuvant chemotherapy with TaxotereCytoxan 4 (because patient has history of cardiac problems and has a pacemaker); 07/19/2016 to 09/23/2016 2. Rt mastectomy with axillary node dissection 12/13/2016 3. Consideration foradjuvant radiation including radiation to T7 vertebral body: Patient refused  ----------------------------------------------------------------------------------------------------------------------------------------- Goal of treatment: Palliation and control of disease Treatment plan: 1. Patient was not keen on receiving adjuvant radiation. 2. PET CT scan: 3 bone metastases identified. T7, L5, right inferior pubic ramus 3. Current treatment:  letrozole 2.5 mg daily with Palbociclib.Started 01/21/2017 NOW ON HOLD  Severe thrombocytopenia: Platelet count 19 unclear etiology. Differential diagnosis is between bone marrow infiltration by cancer versus autoimmune causes. In spite of stopping Palbociclib patient continues to have worsened from cytopenia. 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 I recommended starting the patient on prednisone. She will take 60 mg in the morning and drop daily  By 10 mg.  Return to clinic in one week to recheck on the CBC. If his blood counts do not improve then we may have to consider bone marrow biopsy.   I spent 25 minutes talking to the patient of which more than half was spent in counseling and coordination of care.  Orders Placed This Encounter  Procedures  . CBC with Differential    Standing Status:   Future    Standing Expiration Date:   05/01/2018  . Ferritin    Standing Status:   Future    Standing Expiration Date:   05/01/2018  . Iron and TIBC    Standing Status:   Future    Standing Expiration Date:   05/01/2018   The patient has a good understanding of the overall plan. she agrees with it. she will call with any problems that may develop before the next visit here.   Rulon Eisenmenger, MD 05/01/17

## 2017-05-02 ENCOUNTER — Ambulatory Visit: Payer: PPO | Admitting: Hematology and Oncology

## 2017-05-02 ENCOUNTER — Other Ambulatory Visit: Payer: PPO

## 2017-05-02 LAB — PROTIME-INR: INR: 1.5 — AB (ref 0.9–1.1)

## 2017-05-06 ENCOUNTER — Telehealth: Payer: Self-pay

## 2017-05-06 ENCOUNTER — Other Ambulatory Visit: Payer: Self-pay

## 2017-05-06 DIAGNOSIS — C50211 Malignant neoplasm of upper-inner quadrant of right female breast: Secondary | ICD-10-CM

## 2017-05-06 DIAGNOSIS — Z170 Estrogen receptor positive status [ER+]: Principal | ICD-10-CM

## 2017-05-06 NOTE — Telephone Encounter (Signed)
Patient would like a call back regarding health

## 2017-05-06 NOTE — Progress Notes (Signed)
Spoke with pt regarding her symptoms. Pt states that she now has multiple non painful bruises around her upper extremities and back. Pt states that she is back on 10mg  coumadin. She also noticed traces of blood in her sputum when she coughs and some blood streaks in her stool for 2-3 days now. Pt very fatigue and decreased appetite, despite taking prednisone. Pt states that she also has a very mild feeling of light headedness. Asked if pt would come in today to have lab drawn and to see Dr.Gudena. Pt states that she is unable to because she has no one to drive her to [facility]. Told pt to carefully monitor her symptoms and to make sure to go to the ED if she starts to feel more faint, chest pain, sob, or bleeding gets worse. Told pt to make sure she does not take her coumadin until she sees Dr.Gudena. Pt verbalized understanding and confirmed that she will not take her coumadin tonight until further notice. Pt also confirmed her appt tomorrow lab/flush/md.

## 2017-05-06 NOTE — Telephone Encounter (Signed)
I will call from my cell phone on my way home since it is so late; document tomorrow I did talk to Dr. Landis Gandy and he will manage her coumadin / INR from now on

## 2017-05-07 ENCOUNTER — Ambulatory Visit (HOSPITAL_COMMUNITY)
Admission: RE | Admit: 2017-05-07 | Discharge: 2017-05-07 | Disposition: A | Discharge status: Home / Self Care | Payer: PPO | Source: Ambulatory Visit | Attending: Hematology and Oncology | Admitting: Hematology and Oncology

## 2017-05-07 ENCOUNTER — Ambulatory Visit (HOSPITAL_BASED_OUTPATIENT_CLINIC_OR_DEPARTMENT_OTHER): Payer: PPO

## 2017-05-07 ENCOUNTER — Ambulatory Visit: Payer: PPO

## 2017-05-07 ENCOUNTER — Encounter: Payer: Self-pay | Admitting: Hematology and Oncology

## 2017-05-07 ENCOUNTER — Ambulatory Visit (HOSPITAL_BASED_OUTPATIENT_CLINIC_OR_DEPARTMENT_OTHER): Payer: PPO | Admitting: Hematology and Oncology

## 2017-05-07 ENCOUNTER — Other Ambulatory Visit: Payer: Self-pay | Admitting: *Deleted

## 2017-05-07 VITALS — BP 160/94 | HR 75 | Temp 97.8°F | Resp 18

## 2017-05-07 DIAGNOSIS — D509 Iron deficiency anemia, unspecified: Secondary | ICD-10-CM | POA: Diagnosis not present

## 2017-05-07 DIAGNOSIS — C50411 Malignant neoplasm of upper-outer quadrant of right female breast: Secondary | ICD-10-CM

## 2017-05-07 DIAGNOSIS — D6959 Other secondary thrombocytopenia: Secondary | ICD-10-CM | POA: Diagnosis not present

## 2017-05-07 DIAGNOSIS — Z170 Estrogen receptor positive status [ER+]: Secondary | ICD-10-CM

## 2017-05-07 DIAGNOSIS — Z95828 Presence of other vascular implants and grafts: Secondary | ICD-10-CM

## 2017-05-07 DIAGNOSIS — C50211 Malignant neoplasm of upper-inner quadrant of right female breast: Secondary | ICD-10-CM

## 2017-05-07 DIAGNOSIS — C7951 Secondary malignant neoplasm of bone: Secondary | ICD-10-CM | POA: Diagnosis not present

## 2017-05-07 LAB — COMPREHENSIVE METABOLIC PANEL
ALBUMIN: 3.4 g/dL — AB (ref 3.5–5.0)
ALK PHOS: 216 U/L — AB (ref 40–150)
ALT: 25 U/L (ref 0–55)
ANION GAP: 9 meq/L (ref 3–11)
AST: 101 U/L — ABNORMAL HIGH (ref 5–34)
BILIRUBIN TOTAL: 0.66 mg/dL (ref 0.20–1.20)
BUN: 33.1 mg/dL — ABNORMAL HIGH (ref 7.0–26.0)
CALCIUM: 9.3 mg/dL (ref 8.4–10.4)
CO2: 22 mEq/L (ref 22–29)
Chloride: 107 mEq/L (ref 98–109)
Creatinine: 1.8 mg/dL — ABNORMAL HIGH (ref 0.6–1.1)
EGFR: 34 mL/min/{1.73_m2} — AB (ref >90)
Glucose: 112 mg/dl (ref 70–140)
POTASSIUM: 4 meq/L (ref 3.5–5.1)
SODIUM: 138 meq/L (ref 136–145)
TOTAL PROTEIN: 7.2 g/dL (ref 6.4–8.3)

## 2017-05-07 LAB — PROTIME-INR
INR: 2.6 (ref 2.00–3.50)
Protime: 31.2 Seconds — ABNORMAL HIGH (ref 10.6–13.4)

## 2017-05-07 LAB — ABO/RH: ABO/RH(D): AB POS

## 2017-05-07 LAB — CBC WITH DIFFERENTIAL/PLATELET
BASO%: 4.2 % — ABNORMAL HIGH (ref 0.0–2.0)
BASOS ABS: 0.4 10*3/uL — AB (ref 0.0–0.1)
EOS ABS: 0 10*3/uL (ref 0.0–0.5)
EOS%: 0.4 % (ref 0.0–7.0)
HEMATOCRIT: 25.6 % — AB (ref 34.8–46.6)
HEMOGLOBIN: 8.4 g/dL — AB (ref 11.6–15.9)
LYMPH#: 2.7 10*3/uL (ref 0.9–3.3)
LYMPH%: 32 % (ref 14.0–49.7)
MCH: 30.2 pg (ref 25.1–34.0)
MCHC: 32.8 g/dL (ref 31.5–36.0)
MCV: 92.1 fL (ref 79.5–101.0)
MONO#: 1.1 10*3/uL — ABNORMAL HIGH (ref 0.1–0.9)
MONO%: 13.6 % (ref 0.0–14.0)
NEUT#: 4.1 10*3/uL (ref 1.5–6.5)
NEUT%: 49.8 % (ref 38.4–76.8)
PLATELETS: 19 10*3/uL — AB (ref 145–400)
RBC: 2.78 10*6/uL — ABNORMAL LOW (ref 3.70–5.45)
RDW: 20.1 % — ABNORMAL HIGH (ref 11.2–14.5)
WBC: 8.3 10*3/uL (ref 3.9–10.3)
nRBC: 29 % — ABNORMAL HIGH (ref 0–0)

## 2017-05-07 LAB — TECHNOLOGIST REVIEW: Technologist Review: 1

## 2017-05-07 MED ORDER — SODIUM CHLORIDE 0.9 % IV SOLN
250.0000 mL | Freq: Once | INTRAVENOUS | Status: AC
  Administered 2017-05-07: 250 mL via INTRAVENOUS

## 2017-05-07 MED ORDER — SODIUM CHLORIDE 0.9% FLUSH
10.0000 mL | INTRAVENOUS | Status: DC | PRN
  Administered 2017-05-07: 10 mL via INTRAVENOUS
  Filled 2017-05-07: qty 10

## 2017-05-07 MED ORDER — ACETAMINOPHEN 325 MG PO TABS
650.0000 mg | ORAL_TABLET | Freq: Once | ORAL | Status: AC
  Administered 2017-05-07: 650 mg via ORAL

## 2017-05-07 MED ORDER — HEPARIN SOD (PORK) LOCK FLUSH 100 UNIT/ML IV SOLN
500.0000 [IU] | Freq: Once | INTRAVENOUS | Status: AC | PRN
  Administered 2017-05-07: 500 [IU] via INTRAVENOUS
  Filled 2017-05-07: qty 5

## 2017-05-07 MED ORDER — SODIUM CHLORIDE 0.9% FLUSH
10.0000 mL | INTRAVENOUS | Status: AC | PRN
  Administered 2017-05-07: 10 mL
  Filled 2017-05-07: qty 10

## 2017-05-07 MED ORDER — HEPARIN SOD (PORK) LOCK FLUSH 100 UNIT/ML IV SOLN
500.0000 [IU] | Freq: Every day | INTRAVENOUS | Status: AC | PRN
  Administered 2017-05-07: 500 [IU]
  Filled 2017-05-07: qty 5

## 2017-05-07 MED ORDER — DIPHENHYDRAMINE HCL 25 MG PO CAPS
25.0000 mg | ORAL_CAPSULE | Freq: Once | ORAL | Status: AC
  Administered 2017-05-07: 25 mg via ORAL

## 2017-05-07 MED ORDER — ACETAMINOPHEN 325 MG PO TABS
ORAL_TABLET | ORAL | Status: AC
  Filled 2017-05-07: qty 2

## 2017-05-07 MED ORDER — DIPHENHYDRAMINE HCL 25 MG PO CAPS
ORAL_CAPSULE | ORAL | Status: AC
  Filled 2017-05-07: qty 1

## 2017-05-07 NOTE — Patient Instructions (Signed)
Platelet Transfusion, Care After °Refer to this sheet in the next few weeks. These instructions provide you with information about caring for yourself after your procedure. Your health care provider may also give you more specific instructions. Your treatment has been planned according to current medical practices, but problems sometimes occur. Call your health care provider if you have any problems or questions after your procedure. °What can I expect after the procedure? °After the procedure, it is common to have: °· Bruising and soreness at the IV site. °· Fever or chills within the first 48 hours of your transfusion. ° °Follow these instructions at home: °· Take medicines only as directed by your health care provider. Ask your health care provider if you can take an over-the-counter pain reliever in case you have a fever or headache a day or two after your transfusion. °· Return to your normal activities as directed by your health care provider. °Contact a health care provider if: °· You have a fever. °· You have a headache. °· You have redness, swelling, or pain at your IV site. °· You have skin itching or a rash. °· You vomit. °· You feel unusually tired or weak. °Get help right away if: °· You have trouble breathing. °· You have a decreased amount of urine or you urinate less often than you normally do. °· Your urine is darker than normal. °· You have pain in your back, abdomen, or chest. °· You have cool, clammy skin. °· You have a rapid heartbeat. °This information is not intended to replace advice given to you by your health care provider. Make sure you discuss any questions you have with your health care provider. °Document Released: 10/15/2014 Document Revised: 03/01/2016 Document Reviewed: 08/04/2014 °[employer] Interactive Patient Education © 2018 [employer] ° °

## 2017-05-07 NOTE — Progress Notes (Signed)
Patient Care Team: Sara Courser, MD as PCP - General (Family Medicine) Sara Lance, MD as Consulting Physician (Cardiology) Sara Lose, MD as Consulting Physician (Hematology and Oncology)  DIAGNOSIS:  Encounter Diagnosis  Name Primary?  . Malignant neoplasm of upper-outer quadrant of right breast in female, estrogen receptor positive ([name])     SUMMARY OF ONCOLOGIC HISTORY:   Breast cancer of upper-inner quadrant of right female breast ([name])   06/15/2016 Mammogram    Right breast irregular mass UIQ with diffuse skin thickening that is palpable, additional pleomorphic calcifications 2.8 x 2.1 x 3.5 cm extending posteriorly for another 2 cm; left breast 8 mm circumscribed nodule       06/21/2016 Initial Diagnosis    Right breast biopsy UIQ: IDC grade 2 with papillary features ER 100%, PR 100%, Ki-67 15%, HER-2 negative ratio 1.21; right axillary LN biopsy metastatic carcinoma in one lymph node with ECE, ER 45%, PR 0%, Ki-67 20%, HER-2 negative ratio 1.86      06/28/2016 Procedure    Left breast biopsy: Fibroadenoma      07/16/2016 PET scan    Right breast primary inflammatory breast cancer, right axillary lymph node metastases, T7 bone metastases, indeterminate focus in the left medial breast 7 mm, right adrenal nodule      07/19/2016 - 09/20/2016 Neo-Adjuvant Chemotherapy    Neoadjuvant Taxotere and Cytoxan every 3 weeks 4 cycles      12/13/2016 Surgery    Right mastectomy: Multifocal tumor largest 2.3 cm, remaining 6 foci 0.3-1.2 centimeters, IDC with papillary features and smaller foci of IDC with lobular features grade 2, 19/20 lymph nodes positive, ER 100%, PR 100%, HER-2 negative, Ki-67 15%; repeat testing on the final path EF 30%, PR 0%, HER-2 negative ratio 1.67, Ki-67 20% RCB class 3 T2N3a Stage 3C      01/14/2017 PET scan    Multifocal hypermetabolic bone metastases within the T7 vertebra SUV 10.7. New uptake within L5 SUV 11. Previously known uptake at right  inferior pubic ramus SUV 8.2.      01/21/2017 -  Anti-estrogen oral therapy    Palbociclib with Letrozole       Malignant neoplasm of upper-outer quadrant of right breast in female, estrogen receptor positive ([name])    CHIEF COMPLIANT: Sudden onset of bleeding symptoms  INTERVAL HISTORY: [name] is a 66 year old with above-mentioned history of found metastatic breast cancer currently on Palbociclib and letrozole. This treatment was held because of pancytopenia. All her blood counts are improving except for platelet count. Because of this I thought her on prednisone 60 mg with a tapering schedule. Patient started noticing increasing bleeding and bruising symptoms. She is here today to discuss the lab work and treatment plan. We instructed her yesterday to stop anticoagulation. She continues to feel extremely tired and weak. She has noticed bleeding from the trach as well as her bowels. There is bruising on the left arm.  REVIEW OF SYSTEMS:   Constitutional: Denies fevers, chills or abnormal weight loss Eyes: Denies blurriness of vision Ears, nose, mouth, throat, and face: Denies mucositis or sore throat Respiratory: Denies cough, dyspnea or wheezes Cardiovascular: Denies palpitation, chest discomfort Gastrointestinal:  Denies nausea, heartburn or change in bowel habits Skin: Bruising on the left arm Lymphatics: Denies new lymphadenopathy or easy bruising Neurological:Denies numbness, tingling or new weaknesses Behavioral/Psych: Mood is stable, no new changes  Extremities: No lower extremity edema All other systems were reviewed with the patient and are negative.  I  have reviewed the past medical history, past surgical history, social history and family history with the patient and they are unchanged from previous note.  ALLERGIES:  is allergic to lisinopril and eggs or egg-derived products.  MEDICATIONS:  Current Outpatient Prescriptions  Medication Sig Dispense Refill  .  cholecalciferol (VITAMIN D) 1000 UNITS tablet Take 1,000 Units by mouth daily.    Marland Kitchen gabapentin (NEURONTIN) 300 MG capsule TAKE 1 CAPSULE (300 MG TOTAL) BY MOUTH 3 (THREE) TIMES DAILY. 90 capsule 2  . hydrochlorothiazide (HYDRODIURIL) 12.5 MG tablet TAKE 1 TABLET (12.5 MG TOTAL) BY MOUTH DAILY. 90 tablet 1  . latanoprost (XALATAN) 0.005 % ophthalmic solution Place 1 drop into both eyes at bedtime.     Marland Kitchen letrozole (FEMARA) 2.5 MG tablet Take 1 tablet (2.5 mg total) by mouth daily. 90 tablet 3  . lidocaine-prilocaine (EMLA) cream Apply to affected area once (Patient taking differently: Apply 1 application topically daily as needed (prior to port access). Apply to affected area once) 30 g 3  . losartan (COZAAR) 100 MG tablet Take 1 tablet (100 mg total) by mouth daily. (Patient taking differently: Take 100 mg by mouth at bedtime. ) 30 tablet 5  . ondansetron (ZOFRAN) 8 MG tablet Take 1 tablet (8 mg total) by mouth 2 (two) times daily as needed for refractory nausea / vomiting. Start on day 3 after chemo. 30 tablet 1  . oxyCODONE (OXY IR/ROXICODONE) 5 MG immediate release tablet Take 1-2 tablets (5-10 mg total) by mouth every 4 (four) hours as needed for moderate pain. 40 tablet 0  . palbociclib (IBRANCE) 125 MG capsule Take 1 capsule (125 mg total) by mouth daily with breakfast. Take whole with food. (Patient not taking: Reported on 04/26/2017) 21 capsule 1  . pantoprazole (PROTONIX) 40 MG tablet TAKE 1 TABLET BY MOUTH EVERY DAY 90 tablet 0  . potassium chloride (KLOR-CON M10) 10 MEQ tablet Take 1 tablet (10 mEq total) by mouth 3 (three) times daily. 270 tablet 1  . predniSONE (DELTASONE) 10 MG tablet Take 1 tablet (10 mg total) by mouth daily with breakfast. 6 tabs day 1, 5 day 2; 4 on day 3; 3 on day 4; 2 on day 5; 1 on day 6, 7 50 tablet 0  . prochlorperazine (COMPAZINE) 10 MG tablet Take 1 tablet (10 mg total) by mouth every 6 (six) hours as needed for nausea or vomiting. 30 tablet 3  . promethazine  (PHENERGAN) 12.5 MG tablet Take 12.5 mg by mouth every 6 (six) hours as needed for nausea or vomiting.    . tizanidine (ZANAFLEX) 2 MG capsule Take 1-2 capsules (2-4 mg total) by mouth every 8 (eight) hours as needed for muscle spasms. 40 capsule 1  . triamcinolone cream (KENALOG) 0.1 % Apply 1 application topically 2 (two) times daily. If needed (Patient not taking: Reported on 04/26/2017) 45 g 1  . warfarin (COUMADIN) 4 MG tablet Take 3 tablets (12 mg total) by mouth daily. Or as otherwise directed by your doctor 270 tablet 1   No current facility-administered medications for this visit.    Facility-Administered Medications Ordered in Other Visits  Medication Dose Route Frequency Provider Last Rate Last Dose  . 0.9 %  sodium chloride infusion  250 mL Intravenous Once Sara Lose, MD      . heparin lock flush 100 unit/mL  500 Units Intracatheter Daily PRN Sara Lose, MD      . sodium chloride flush (NS) 0.9 % injection 10 mL  10  mL Intracatheter PRN Sara Lose, MD        PHYSICAL EXAMINATION: ECOG PERFORMANCE STATUS: 2 - Symptomatic, <50% confined to bed  Vitals:   05/07/17 1444  BP: (!) 176/90  Pulse: 82  Resp: 18  Temp: 97.7 F (36.5 C)   Filed Weights   05/07/17 1444  Weight: 230 lb 1.6 oz (104.4 kg)    GENERAL:alert, no distress and comfortable SKIN: skin color, texture, turgor are normal, no rashes or significant lesions EYES: normal, Conjunctiva are pink and non-injected, sclera clear OROPHARYNX:no exudate, no erythema and lips, buccal mucosa, and tongue normal  NECK: supple, thyroid normal size, non-tender, without nodularity LYMPH:  no palpable lymphadenopathy in the cervical, axillary or inguinal LUNGS: clear to auscultation and percussion with normal breathing effort HEART: regular rate & rhythm and no murmurs and no lower extremity edema ABDOMEN:abdomen soft, non-tender and normal bowel sounds MUSCULOSKELETAL:no cyanosis of digits and no clubbing  NEURO:  alert & oriented x 3 with fluent speech, no focal motor/sensory deficits EXTREMITIES: No lower extremity edema  LABORATORY DATA:  I have reviewed the data as listed   Chemistry      Component Value Date/Time   NA 138 05/07/2017 1413   K 4.0 05/07/2017 1413   CL 107 12/17/2016 0414   CO2 22 05/07/2017 1413   BUN 33.1 (H) 05/07/2017 1413   CREATININE 1.8 (H) 05/07/2017 1413   GLU 98 11/26/2014      Component Value Date/Time   CALCIUM 9.3 05/07/2017 1413   ALKPHOS 216 (H) 05/07/2017 1413   AST 101 (H) 05/07/2017 1413   ALT 25 05/07/2017 1413   BILITOT 0.66 05/07/2017 1413       Lab Results  Component Value Date   WBC 8.3 05/07/2017   HGB 8.4 (L) 05/07/2017   HCT 25.6 (L) 05/07/2017   MCV 92.1 05/07/2017   PLT 19 (L) 05/07/2017   NEUTROABS 4.1 05/07/2017    ASSESSMENT & PLAN:  Malignant neoplasm of upper-outer quadrant of right breast in female, estrogen receptor positive ([place]) Right mastectomy 12/13/2016: Multifocal tumor largest 2.3 cm, remaining 6 foci 0.3-1.2 centimeters, IDC with papillary features and smaller foci of IDC with lobular features grade 2, 19/20 lymph nodes positive, ER 100%, PR 100%, HER-2 negative, Ki-67 15%; repeat testing on the final path EF 30%, PR 0%, HER-2 negative ratio 1.67, Ki-67 20% RCB class 3 T2N3a Stage 3C  07/16/2016: PET/CT scan: Right breast inflammatory breast cancer, T7 osseous metastases, indeterminate focus medial left breast corresponding to 7 mm calcified soft tissue nodule, right adrenal adenoma  Clinical stage: T4 N1M1stage IV  Treatment summary: 1. Neoadjuvant chemotherapy with TaxotereCytoxan 4 (because patient has history of cardiac problems and has a pacemaker); 07/19/2016 to 09/23/2016 2. Rt mastectomy with axillary node dissection 12/13/2016 3. Consideration foradjuvant radiation including radiation to T7 vertebral body: Patient  refused  ----------------------------------------------------------------------------------------------------------------------------------------- Goal of treatment: Palliation and control of disease Treatment plan: 1. Patient was not keen on receiving adjuvant radiation. 2. PET CT scan: 3 bone metastases identified. T7, L5, right inferior pubic ramus 3. Current treatment: letrozole 2.5 mg daily with Palbociclib.Started 01/21/2017 NOW ON HOLD ---------------------------------------------------------------------- Recent onset of severe bleeding: Due to thrombocytopenia  We started the patient on prednisone last week. It did not seem to make any difference. Patient was experiencing blood in the nose and urine She will need platelet transfusions Discontinue anticoagulation I will discuss with her cardiologist whether she needs to be on anticoagulation.  Patient will be given platelet transfusion  today. We will be seeing her weekly for blood count checks and follow-ups. I instructed her not to resume Coumadin until next week (until platelet counts goes above 50)  I spent 25 minutes talking to the patient of which more than half was spent in counseling and coordination of care.  No orders of the defined types were placed in this encounter.  The patient has a good understanding of the overall plan. she agrees with it. she will call with any problems that may develop before the next visit here.   Rulon Eisenmenger, MD 05/07/17

## 2017-05-07 NOTE — Assessment & Plan Note (Signed)
Right mastectomy 12/13/2016: Multifocal tumor largest 2.3 cm, remaining 6 foci 0.3-1.2 centimeters, IDC with papillary features and smaller foci of IDC with lobular features grade 2, 19/20 lymph nodes positive, ER 100%, PR 100%, HER-2 negative, Ki-67 15%; repeat testing on the final path EF 30%, PR 0%, HER-2 negative ratio 1.67, Ki-67 20% RCB class 3 T2N3a Stage 3C  07/16/2016: PET/CT scan: Right breast inflammatory breast cancer, T7 osseous metastases, indeterminate focus medial left breast corresponding to 7 mm calcified soft tissue nodule, right adrenal adenoma  Clinical stage: T4 N1M1stage IV  Treatment summary: 1. Neoadjuvant chemotherapy with TaxotereCytoxan 4 (because patient has history of cardiac problems and has a pacemaker); 07/19/2016 to 09/23/2016 2. Rt mastectomy with axillary node dissection 12/13/2016 3. Consideration foradjuvant radiation including radiation to T7 vertebral body: Patient refused  ----------------------------------------------------------------------------------------------------------------------------------------- Goal of treatment: Palliation and control of disease Treatment plan: 1. Patient was not keen on receiving adjuvant radiation. 2. PET CT scan: 3 bone metastases identified. T7, L5, right inferior pubic ramus 3. Current treatment: letrozole 2.5 mg daily with Palbociclib.Started 01/21/2017 NOW ON HOLD ---------------------------------------------------------------------- Recent onset of severe bleeding: Due to thrombocytopenia  We started the patient on prednisone last week. It did not seem to make any difference. Patient was experiencing blood in the nose and urine She will need platelet transfusions Discontinue anticoagulation  Patient will need platelet transfusions if the platelet count drops below 20. We will be seeing her weekly for blood count checks and follow-ups.

## 2017-05-08 ENCOUNTER — Other Ambulatory Visit: Payer: PPO

## 2017-05-08 ENCOUNTER — Ambulatory Visit: Payer: PPO | Admitting: Hematology and Oncology

## 2017-05-08 ENCOUNTER — Encounter: Payer: Self-pay | Admitting: Family Medicine

## 2017-05-08 LAB — IRON AND TIBC
%SAT: 42 % (ref 21–57)
Iron: 92 ug/dL (ref 41–142)
TIBC: 220 ug/dL — AB (ref 236–444)
UIBC: 128 ug/dL (ref 120–384)

## 2017-05-08 LAB — BPAM PLATELET PHERESIS
Blood Product Expiration Date: 201808011614
ISSUE DATE / TIME: 201807311620
UNIT TYPE AND RH: 6200

## 2017-05-08 LAB — PREPARE PLATELET PHERESIS: Unit division: 0

## 2017-05-08 LAB — FERRITIN: Ferritin: 2587 ng/ml — ABNORMAL HIGH (ref 9–269)

## 2017-05-08 NOTE — Telephone Encounter (Signed)
I have called but am unable to reach patient Home phone rings and asks for remote access code I have left two messages on her cell phone; since I can't reach her, I'll respectfully put the ball back on her side of the court and invite her to call and staff can pull me from a room, or she can let them know a better time and/or phone number to reach her at her convenience I have reviewed Dr. Geralyn Flash note from yesterday outlining her low platelet count -------------------------- Staff -- if/when patient calls, it is okay to pull me out of a room to take her call

## 2017-05-13 ENCOUNTER — Inpatient Hospital Stay (HOSPITAL_COMMUNITY)
Admission: EM | Admit: 2017-05-13 | Discharge: 2017-05-18 | DRG: 065 | Disposition: A | Discharge status: Home Health | Payer: PPO | Attending: Family Medicine | Admitting: Family Medicine

## 2017-05-13 ENCOUNTER — Emergency Department (HOSPITAL_COMMUNITY): Payer: PPO

## 2017-05-13 ENCOUNTER — Inpatient Hospital Stay (HOSPITAL_COMMUNITY): Payer: PPO

## 2017-05-13 ENCOUNTER — Encounter (HOSPITAL_COMMUNITY): Payer: Self-pay | Admitting: Emergency Medicine

## 2017-05-13 ENCOUNTER — Other Ambulatory Visit: Payer: Self-pay

## 2017-05-13 ENCOUNTER — Telehealth: Payer: Self-pay | Admitting: *Deleted

## 2017-05-13 DIAGNOSIS — R200 Anesthesia of skin: Secondary | ICD-10-CM

## 2017-05-13 DIAGNOSIS — E1122 Type 2 diabetes mellitus with diabetic chronic kidney disease: Secondary | ICD-10-CM | POA: Diagnosis present

## 2017-05-13 DIAGNOSIS — M352 Behcet's disease: Secondary | ICD-10-CM | POA: Diagnosis present

## 2017-05-13 DIAGNOSIS — F17210 Nicotine dependence, cigarettes, uncomplicated: Secondary | ICD-10-CM | POA: Diagnosis not present

## 2017-05-13 DIAGNOSIS — R079 Chest pain, unspecified: Secondary | ICD-10-CM | POA: Diagnosis not present

## 2017-05-13 DIAGNOSIS — N183 Chronic kidney disease, stage 3 unspecified: Secondary | ICD-10-CM | POA: Diagnosis present

## 2017-05-13 DIAGNOSIS — D696 Thrombocytopenia, unspecified: Secondary | ICD-10-CM

## 2017-05-13 DIAGNOSIS — E279 Disorder of adrenal gland, unspecified: Secondary | ICD-10-CM | POA: Diagnosis not present

## 2017-05-13 DIAGNOSIS — M25569 Pain in unspecified knee: Secondary | ICD-10-CM

## 2017-05-13 DIAGNOSIS — Z930 Tracheostomy status: Secondary | ICD-10-CM

## 2017-05-13 DIAGNOSIS — R29898 Other symptoms and signs involving the musculoskeletal system: Secondary | ICD-10-CM | POA: Diagnosis not present

## 2017-05-13 DIAGNOSIS — Z79811 Long term (current) use of aromatase inhibitors: Secondary | ICD-10-CM

## 2017-05-13 DIAGNOSIS — D6481 Anemia due to antineoplastic chemotherapy: Secondary | ICD-10-CM | POA: Diagnosis not present

## 2017-05-13 DIAGNOSIS — Z8673 Personal history of transient ischemic attack (TIA), and cerebral infarction without residual deficits: Secondary | ICD-10-CM

## 2017-05-13 DIAGNOSIS — I2510 Atherosclerotic heart disease of native coronary artery without angina pectoris: Secondary | ICD-10-CM | POA: Diagnosis present

## 2017-05-13 DIAGNOSIS — Z981 Arthrodesis status: Secondary | ICD-10-CM

## 2017-05-13 DIAGNOSIS — I6201 Nontraumatic acute subdural hemorrhage: Secondary | ICD-10-CM | POA: Diagnosis not present

## 2017-05-13 DIAGNOSIS — Z950 Presence of cardiac pacemaker: Secondary | ICD-10-CM | POA: Diagnosis present

## 2017-05-13 DIAGNOSIS — I6203 Nontraumatic chronic subdural hemorrhage: Secondary | ICD-10-CM | POA: Diagnosis not present

## 2017-05-13 DIAGNOSIS — N189 Chronic kidney disease, unspecified: Secondary | ICD-10-CM | POA: Diagnosis not present

## 2017-05-13 DIAGNOSIS — F329 Major depressive disorder, single episode, unspecified: Secondary | ICD-10-CM | POA: Diagnosis not present

## 2017-05-13 DIAGNOSIS — D649 Anemia, unspecified: Secondary | ICD-10-CM | POA: Diagnosis not present

## 2017-05-13 DIAGNOSIS — Z9012 Acquired absence of left breast and nipple: Secondary | ICD-10-CM

## 2017-05-13 DIAGNOSIS — Z95810 Presence of automatic (implantable) cardiac defibrillator: Secondary | ICD-10-CM

## 2017-05-13 DIAGNOSIS — Z170 Estrogen receptor positive status [ER+]: Secondary | ICD-10-CM | POA: Diagnosis not present

## 2017-05-13 DIAGNOSIS — G8194 Hemiplegia, unspecified affecting left nondominant side: Secondary | ICD-10-CM | POA: Diagnosis not present

## 2017-05-13 DIAGNOSIS — E1140 Type 2 diabetes mellitus with diabetic neuropathy, unspecified: Secondary | ICD-10-CM | POA: Diagnosis not present

## 2017-05-13 DIAGNOSIS — I6202 Nontraumatic subacute subdural hemorrhage: Secondary | ICD-10-CM | POA: Diagnosis not present

## 2017-05-13 DIAGNOSIS — I482 Chronic atrial fibrillation, unspecified: Secondary | ICD-10-CM | POA: Diagnosis present

## 2017-05-13 DIAGNOSIS — C50919 Malignant neoplasm of unspecified site of unspecified female breast: Secondary | ICD-10-CM

## 2017-05-13 DIAGNOSIS — J9610 Chronic respiratory failure, unspecified whether with hypoxia or hypercapnia: Secondary | ICD-10-CM | POA: Diagnosis not present

## 2017-05-13 DIAGNOSIS — I10 Essential (primary) hypertension: Secondary | ICD-10-CM | POA: Diagnosis not present

## 2017-05-13 DIAGNOSIS — G620 Drug-induced polyneuropathy: Secondary | ICD-10-CM | POA: Diagnosis not present

## 2017-05-13 DIAGNOSIS — C50211 Malignant neoplasm of upper-inner quadrant of right female breast: Secondary | ICD-10-CM | POA: Diagnosis present

## 2017-05-13 DIAGNOSIS — E1142 Type 2 diabetes mellitus with diabetic polyneuropathy: Secondary | ICD-10-CM | POA: Diagnosis present

## 2017-05-13 DIAGNOSIS — D72829 Elevated white blood cell count, unspecified: Secondary | ICD-10-CM | POA: Diagnosis not present

## 2017-05-13 DIAGNOSIS — Z9011 Acquired absence of right breast and nipple: Secondary | ICD-10-CM

## 2017-05-13 DIAGNOSIS — S065X0A Traumatic subdural hemorrhage without loss of consciousness, initial encounter: Secondary | ICD-10-CM | POA: Diagnosis not present

## 2017-05-13 DIAGNOSIS — I5032 Chronic diastolic (congestive) heart failure: Secondary | ICD-10-CM | POA: Diagnosis not present

## 2017-05-13 DIAGNOSIS — S065XAA Traumatic subdural hemorrhage with loss of consciousness status unknown, initial encounter: Secondary | ICD-10-CM | POA: Diagnosis present

## 2017-05-13 DIAGNOSIS — K219 Gastro-esophageal reflux disease without esophagitis: Secondary | ICD-10-CM | POA: Diagnosis present

## 2017-05-13 DIAGNOSIS — Z7901 Long term (current) use of anticoagulants: Secondary | ICD-10-CM

## 2017-05-13 DIAGNOSIS — G8921 Chronic pain due to trauma: Secondary | ICD-10-CM | POA: Diagnosis present

## 2017-05-13 DIAGNOSIS — I6200 Nontraumatic subdural hemorrhage, unspecified: Principal | ICD-10-CM

## 2017-05-13 DIAGNOSIS — Z7952 Long term (current) use of systemic steroids: Secondary | ICD-10-CM

## 2017-05-13 DIAGNOSIS — I252 Old myocardial infarction: Secondary | ICD-10-CM | POA: Diagnosis not present

## 2017-05-13 DIAGNOSIS — I130 Hypertensive heart and chronic kidney disease with heart failure and stage 1 through stage 4 chronic kidney disease, or unspecified chronic kidney disease: Secondary | ICD-10-CM | POA: Diagnosis not present

## 2017-05-13 DIAGNOSIS — N179 Acute kidney failure, unspecified: Secondary | ICD-10-CM | POA: Diagnosis present

## 2017-05-13 DIAGNOSIS — J189 Pneumonia, unspecified organism: Secondary | ICD-10-CM

## 2017-05-13 DIAGNOSIS — Z888 Allergy status to other drugs, medicaments and biological substances status: Secondary | ICD-10-CM

## 2017-05-13 DIAGNOSIS — D631 Anemia in chronic kidney disease: Secondary | ICD-10-CM | POA: Diagnosis not present

## 2017-05-13 DIAGNOSIS — C7951 Secondary malignant neoplasm of bone: Secondary | ICD-10-CM | POA: Diagnosis present

## 2017-05-13 DIAGNOSIS — R52 Pain, unspecified: Secondary | ICD-10-CM | POA: Diagnosis not present

## 2017-05-13 DIAGNOSIS — T451X5A Adverse effect of antineoplastic and immunosuppressive drugs, initial encounter: Secondary | ICD-10-CM | POA: Diagnosis present

## 2017-05-13 DIAGNOSIS — M48061 Spinal stenosis, lumbar region without neurogenic claudication: Secondary | ICD-10-CM | POA: Diagnosis present

## 2017-05-13 DIAGNOSIS — G894 Chronic pain syndrome: Secondary | ICD-10-CM | POA: Diagnosis present

## 2017-05-13 DIAGNOSIS — S065X9A Traumatic subdural hemorrhage with loss of consciousness of unspecified duration, initial encounter: Secondary | ICD-10-CM | POA: Diagnosis present

## 2017-05-13 DIAGNOSIS — Z91012 Allergy to eggs: Secondary | ICD-10-CM

## 2017-05-13 DIAGNOSIS — Z79899 Other long term (current) drug therapy: Secondary | ICD-10-CM

## 2017-05-13 LAB — CBC
HCT: 25.3 % — ABNORMAL LOW (ref 36.0–46.0)
HEMATOCRIT: 23.3 % — AB (ref 36.0–46.0)
HEMOGLOBIN: 8.3 g/dL — AB (ref 12.0–15.0)
HEMOGLOBIN: 7.5 g/dL — AB (ref 12.0–15.0)
MCH: 30.3 pg (ref 26.0–34.0)
MCH: 29.3 pg (ref 26.0–34.0)
MCHC: 32.8 g/dL (ref 30.0–36.0)
MCHC: 32.2 g/dL (ref 30.0–36.0)
MCV: 92.3 fL (ref 78.0–100.0)
MCV: 91 fL (ref 78.0–100.0)
Platelets: 6 10*3/uL — CL (ref 150–400)
Platelets: 12 10*3/uL — CL (ref 150–400)
RBC: 2.74 MIL/uL — AB (ref 3.87–5.11)
RBC: 2.56 MIL/uL — AB (ref 3.87–5.11)
RDW: 19.9 % — ABNORMAL HIGH (ref 11.5–15.5)
RDW: 19.5 % — ABNORMAL HIGH (ref 11.5–15.5)
WBC: 11.9 10*3/uL — AB (ref 4.0–10.5)
WBC: 7.5 10*3/uL (ref 4.0–10.5)

## 2017-05-13 LAB — TROPONIN I
TROPONIN I: 0.03 ng/mL — AB (ref <0.03)
Troponin I: <0.03 ng/mL (ref <0.03)

## 2017-05-13 LAB — BASIC METABOLIC PANEL
ANION GAP: 10 (ref 5–15)
BUN: 24 mg/dL — ABNORMAL HIGH (ref 6–20)
CALCIUM: 10 mg/dL (ref 8.9–10.3)
CO2: 20 mmol/L — AB (ref 22–32)
Chloride: 104 mmol/L (ref 101–111)
Creatinine, Ser: 2.02 mg/dL — ABNORMAL HIGH (ref 0.44–1.00)
GFR, EST AFRICAN AMERICAN: 29 mL/min — AB (ref >60)
GFR, EST NON AFRICAN AMERICAN: 25 mL/min — AB (ref >60)
Glucose, Bld: 122 mg/dL — ABNORMAL HIGH (ref 65–99)
Potassium: 4 mmol/L (ref 3.5–5.1)
Sodium: 134 mmol/L — ABNORMAL LOW (ref 135–145)

## 2017-05-13 LAB — I-STAT CHEM 8, ED
BUN: 26 mg/dL — AB (ref 6–20)
CREATININE: 2.1 mg/dL — AB (ref 0.44–1.00)
Calcium, Ion: 1.25 mmol/L (ref 1.15–1.40)
Chloride: 104 mmol/L (ref 101–111)
GLUCOSE: 120 mg/dL — AB (ref 65–99)
HEMATOCRIT: 22 % — AB (ref 36.0–46.0)
HEMOGLOBIN: 7.5 g/dL — AB (ref 12.0–15.0)
POTASSIUM: 4 mmol/L (ref 3.5–5.1)
Sodium: 136 mmol/L (ref 135–145)
TCO2: 23 mmol/L (ref 0–100)

## 2017-05-13 LAB — I-STAT TROPONIN, ED: TROPONIN I, POC: 0 ng/mL (ref 0.00–0.08)

## 2017-05-13 LAB — URINALYSIS, ROUTINE W REFLEX MICROSCOPIC
Bilirubin Urine: NEGATIVE
Glucose, UA: NEGATIVE mg/dL
KETONES UR: NEGATIVE mg/dL
Nitrite: NEGATIVE
PROTEIN: 100 mg/dL — AB
Specific Gravity, Urine: 1.014 (ref 1.005–1.030)
pH: 6 (ref 5.0–8.0)

## 2017-05-13 LAB — MRSA PCR SCREENING: MRSA BY PCR: NEGATIVE

## 2017-05-13 LAB — PREPARE RBC (CROSSMATCH)

## 2017-05-13 LAB — PATHOLOGIST SMEAR REVIEW

## 2017-05-13 LAB — PROTIME-INR
INR: 1.27
PROTHROMBIN TIME: 16 s — AB (ref 11.4–15.2)

## 2017-05-13 LAB — ABO/RH: ABO/RH(D): AB POS

## 2017-05-13 MED ORDER — MORPHINE SULFATE (PF) 4 MG/ML IV SOLN: 4.0000 mg | INTRAVENOUS | Status: DC | PRN

## 2017-05-13 MED ORDER — ACETAMINOPHEN 325 MG PO TABS
650.0000 mg | ORAL_TABLET | Freq: Once | ORAL | Status: AC
  Administered 2017-05-13: 650 mg via ORAL
  Filled 2017-05-13: qty 2

## 2017-05-13 MED ORDER — HYDRALAZINE HCL 20 MG/ML IJ SOLN: 5.0000 mg | INTRAMUSCULAR | Status: DC | PRN

## 2017-05-13 MED ORDER — SODIUM CHLORIDE 0.9 % IV SOLN: 10.0000 mL/h | Freq: Once | INTRAVENOUS | Status: DC

## 2017-05-13 MED ORDER — SODIUM CHLORIDE 0.45 % IV SOLN
INTRAVENOUS | Status: DC
  Administered 2017-05-13 – 2017-05-18 (×3): via INTRAVENOUS

## 2017-05-13 MED ORDER — VANCOMYCIN HCL IN DEXTROSE 1-5 GM/200ML-% IV SOLN
1000.0000 mg | INTRAVENOUS | Status: DC
Start: 2017-05-14 — End: 2017-05-13

## 2017-05-13 MED ORDER — SODIUM CHLORIDE 0.9 % IV SOLN
500.0000 mg | Freq: Two times a day (BID) | INTRAVENOUS | Status: DC
  Administered 2017-05-13 – 2017-05-18 (×11): 500 mg via INTRAVENOUS
  Filled 2017-05-13 (×13): qty 5

## 2017-05-13 MED ORDER — LETROZOLE 2.5 MG PO TABS
2.5000 mg | ORAL_TABLET | Freq: Every day | ORAL | Status: DC
  Administered 2017-05-13 – 2017-05-18 (×6): 2.5 mg via ORAL
  Filled 2017-05-13 (×6): qty 1

## 2017-05-13 MED ORDER — PANTOPRAZOLE SODIUM 40 MG PO TBEC
40.0000 mg | DELAYED_RELEASE_TABLET | Freq: Every day | ORAL | Status: DC
  Administered 2017-05-13 – 2017-05-18 (×6): 40 mg via ORAL
  Filled 2017-05-13 (×6): qty 1

## 2017-05-13 MED ORDER — PROCHLORPERAZINE MALEATE 10 MG PO TABS
10.0000 mg | ORAL_TABLET | Freq: Four times a day (QID) | ORAL | Status: DC | PRN
  Filled 2017-05-13: qty 1

## 2017-05-13 MED ORDER — DEXTROSE 5 % IV SOLN
2.0000 g | Freq: Once | INTRAVENOUS | Status: AC
  Administered 2017-05-13: 2 g via INTRAVENOUS

## 2017-05-13 MED ORDER — IPRATROPIUM-ALBUTEROL 0.5-2.5 (3) MG/3ML IN SOLN
3.0000 mL | Freq: Once | RESPIRATORY_TRACT | Status: AC
  Administered 2017-05-13: 3 mL via RESPIRATORY_TRACT
  Filled 2017-05-13: qty 3

## 2017-05-13 MED ORDER — SODIUM CHLORIDE 0.9 % IV SOLN
Freq: Once | INTRAVENOUS | Status: AC
  Administered 2017-05-13: 19:00:00 via INTRAVENOUS

## 2017-05-13 MED ORDER — ACETAMINOPHEN 325 MG PO TABS
650.0000 mg | ORAL_TABLET | Freq: Four times a day (QID) | ORAL | Status: DC | PRN
Start: 2017-05-13 — End: 2017-05-18

## 2017-05-13 MED ORDER — ONDANSETRON HCL 4 MG PO TABS
4.0000 mg | ORAL_TABLET | Freq: Four times a day (QID) | ORAL | Status: DC | PRN
Start: 2017-05-13 — End: 2017-05-18

## 2017-05-13 MED ORDER — VANCOMYCIN HCL IN DEXTROSE 1-5 GM/200ML-% IV SOLN
1000.0000 mg | Freq: Once | INTRAVENOUS | Status: AC
  Administered 2017-05-13: 1000 mg via INTRAVENOUS
  Filled 2017-05-13: qty 200

## 2017-05-13 MED ORDER — SODIUM CHLORIDE 0.9 % IV SOLN: Freq: Once | INTRAVENOUS | Status: DC

## 2017-05-13 MED ORDER — LATANOPROST 0.005 % OP SOLN
1.0000 [drp] | Freq: Every day | OPHTHALMIC | Status: DC
  Administered 2017-05-13 – 2017-05-17 (×4): 1 [drp] via OPHTHALMIC
  Filled 2017-05-13 (×2): qty 2.5

## 2017-05-13 MED ORDER — FENTANYL CITRATE (PF) 100 MCG/2ML IJ SOLN
25.0000 ug | Freq: Once | INTRAMUSCULAR | Status: AC
  Administered 2017-05-13: 25 ug via INTRAVENOUS
  Filled 2017-05-13: qty 2

## 2017-05-13 MED ORDER — ONDANSETRON HCL 4 MG/2ML IJ SOLN
4.0000 mg | Freq: Four times a day (QID) | INTRAMUSCULAR | Status: DC | PRN
  Administered 2017-05-15: 4 mg via INTRAVENOUS
  Filled 2017-05-13: qty 2

## 2017-05-13 MED ORDER — POTASSIUM CHLORIDE CRYS ER 10 MEQ PO TBCR
10.0000 meq | EXTENDED_RELEASE_TABLET | Freq: Three times a day (TID) | ORAL | Status: DC
  Administered 2017-05-13 – 2017-05-15 (×7): 10 meq via ORAL
  Filled 2017-05-13 (×7): qty 1

## 2017-05-13 MED ORDER — TIZANIDINE HCL 2 MG PO TABS
2.0000 mg | ORAL_TABLET | Freq: Three times a day (TID) | ORAL | Status: DC | PRN
  Filled 2017-05-13: qty 2

## 2017-05-13 MED ORDER — ACETAMINOPHEN 650 MG RE SUPP: 650.0000 mg | Freq: Four times a day (QID) | RECTAL | Status: DC | PRN

## 2017-05-13 MED ORDER — LIDOCAINE-PRILOCAINE 2.5-2.5 % EX CREA
TOPICAL_CREAM | Freq: Once | CUTANEOUS | Status: AC
  Administered 2017-05-13: 02:00:00 via TOPICAL
  Filled 2017-05-13: qty 5

## 2017-05-13 MED ORDER — PROCHLORPERAZINE MALEATE 10 MG PO TABS
10.0000 mg | ORAL_TABLET | Freq: Four times a day (QID) | ORAL | Status: DC | PRN
Start: 2017-05-13 — End: 2017-05-13
  Filled 2017-05-13: qty 1

## 2017-05-13 MED ORDER — GABAPENTIN 300 MG PO CAPS
300.0000 mg | ORAL_CAPSULE | Freq: Three times a day (TID) | ORAL | Status: DC
  Administered 2017-05-13 – 2017-05-17 (×11): 300 mg via ORAL
  Filled 2017-05-13 (×11): qty 1

## 2017-05-13 MED ORDER — OXYCODONE HCL 5 MG PO TABS
5.0000 mg | ORAL_TABLET | ORAL | Status: DC | PRN
  Administered 2017-05-13 – 2017-05-14 (×3): 10 mg via ORAL
  Filled 2017-05-13 (×3): qty 2

## 2017-05-13 NOTE — ED Notes (Signed)
EDP at bedside  

## 2017-05-13 NOTE — Progress Notes (Signed)
Patient crying with visitor at the bedside. Reports that she can't feel legs currently. Patient stated she can feel to mid thigh but not much lower. Reports that legs are numb and tingling. Patient shared that this has been coming and going and that she had to call for help at home because she was unable to move.

## 2017-05-13 NOTE — ED Notes (Signed)
Pt tearful, when ask if family could be called pt states she left her cell phone at home, # for family/friend found in chart, chaplain called for emotional support

## 2017-05-13 NOTE — H&P (Addendum)
History and Physical    [name]:[id number] DOB: 16-Nov-1950 DOA: 05/13/2017  PCP: Arnetha Courser, MD Patient coming from: home  Chief Complaint: Leg numbness and weakness and CP  HPI: [name] is a 66 y.o. female with medical history significant of CVA, permanent tracheostomy, hypertension, GERD, C KD, chronic hypokalemia, CHF, metastatic breast cancer, atrial fibrillation with pacemaker placement,, brain aneurysm.   Level V caveat applies to history obtained by patient due to patient being fatigued and wanting to sleep and not be bothered. Patient not very engaging and overall exam at time of admission and appears to be a poor historian with more generalities then specifics with regards to her current physical complaints.  Patient presenting with acute onset bilateral lower extremity numbness and weakness. Denies saddle anesthesia or loss of bowel or bladder function or lumbar back pain. Symptoms are constant but slowly improving. Patient states that she did not attempt to walk but simply hit her life alert button and came to the hospital for evaluation and workup. She's never had this problem before. Is not tried anything to alleviate her symptoms.  Patient states she's had headache off and on but appears to be unrelated to current symptoms as she has these chronically. At time of my exam pt denies any facial numbness which is different from what pt reported to EDP.   Patient also endorses a few intermittent episodes of chest pain. Short lived. These start to occur after realizing her legs were numb. Denies any associated palpitations, LE swelling, orthopnea, radiation of pain to her neck and jaw or left shoulder. No associated diaphoresis or nausea or shortness of breath.  ED Course: objective findings outlined below. Started on vanc and cef. plt transfusion begun and neurosurgery consulted.   Review of Systems: As per HPI otherwise all other systems reviewed and are  negative  Ambulatory Status:no restrictions  Past Medical History:  Diagnosis Date  . Aneurysm ([place]) 1998   Brain  . Anticoagulated on Coumadin   . Arthritis   . Atherosclerosis of coronary artery of native heart without angina pectoris   . Atrial fibrillation, chronic ([place])   . 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 Breast cancer ([place]) 06/21/2016   Right breast  . CHF (congestive heart failure) ([place])   . Chronic hypokalemia   . Chronic kidney disease    stage III  . Chronic pain due to trauma   . Difficult intubation    permanent tracheostomy;lungs filled with water when extubated  . Frequent headaches   . GERD (gastroesophageal reflux disease)   . History of stomach ulcers   . Hypertension   . Hypertension, goal below 140/90   . Myocardial infarction ([place])   . PONV (postoperative nausea and vomiting)   . 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 Spinal headache   . 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 Trachea, ring   . Tracheostomy in place [facility])   . Urinary tract infection, recurrent   . Urine incontinence     Past Surgical History:  Procedure Laterality Date  . ABDOMINAL HYSTERECTOMY    . BACK SURGERY    . BLADDER SURGERY     hitch  . CHOLECYSTECTOMY    . FRACTURE SURGERY     B/L feet and right ankle  . GANGLION CYST EXCISION  2011  . MASTECTOMY MODIFIED RADICAL Bilateral 12/13/2016  Procedure: RIGHT MODIFIED RADICAL MASTECTOMY, LEFT SIMPLE MASTECTOMY;  Surgeon: Coralie Keens, MD;  Location: [place];  Service: General;  Laterality: Bilateral;  . PACEMAKER INSERTION  10/29/2005  . PACEMAKER PLACEMENT  08/22/2012  . PORT A CATH INJECTION ([place] [facility])  07/18/2016  . PORTACATH PLACEMENT Right 07/18/2016   Procedure: INSERTION PORT-A-CATH;  Surgeon: Coralie Keens, MD;  Location: [place];  Service: General;  Laterality: Right;  . TRACHEAL SURGERY  2000  .  UVULOPALATOPHARYNGOPLASTY      Social History   Social History  . Marital status: Single    Spouse name: N/A  . Number of children: N/A  . Years of education: N/A   Occupational History  . disabled    Social History Main Topics  . Smoking status: Current Some Day Smoker    Years: 40.00    Types: Cigarettes  . Smokeless tobacco: Never Used     Comment: 3 a week.  pt can't remember when she had one last  . Alcohol use No  . Drug use: No  . Sexual activity: Yes   Other Topics Concern  . Not on file   Social History Narrative  . No narrative on file    Allergies  Allergen Reactions  . Lisinopril Cough  . Eggs Or Egg-Derived Products Rash    Raw eggs ONLY (boiled, scrambled, etc)  If baked into something it is NOT a issue    Family History  Problem Relation Age of Onset  . Heart disease Mother   . Hypertension Mother   . Anemia Mother   . Heart disease Father   . Hypertension Father   . Anemia Father   . Cancer Unknown   . Heart disease Sister   . Heart disease Brother   . Mental illness Brother       Prior to Admission medications   Medication Sig Start Date End Date Taking? Authorizing Provider  cholecalciferol (VITAMIN D) 1000 UNITS tablet Take 1,000 Units by mouth daily.   Yes [provider]  gabapentin (NEURONTIN) 300 MG capsule TAKE 1 CAPSULE (300 MG TOTAL) BY MOUTH 3 (THREE) TIMES DAILY. 01/22/17  Yes Lada, Satira Anis, MD  hydrochlorothiazide (HYDRODIURIL) 12.5 MG tablet TAKE 1 TABLET (12.5 MG TOTAL) BY MOUTH DAILY. 02/28/17  Yes Lada, Satira Anis, MD  latanoprost (XALATAN) 0.005 % ophthalmic solution Place 1 drop into both eyes at bedtime.  05/04/10  Yes [provider]  letrozole (FEMARA) 2.5 MG tablet Take 1 tablet (2.5 mg total) by mouth daily. 12/24/16  Yes Nicholas Lose, MD  lidocaine-prilocaine (EMLA) cream Apply to affected area once Patient taking differently: Apply 1 application topically daily as needed (prior to port access).  Apply to affected area once 07/05/16  Yes Nicholas Lose, MD  losartan (COZAAR) 100 MG tablet Take 1 tablet (100 mg total) by mouth daily. Patient taking differently: Take 100 mg by mouth at bedtime.  08/02/16  Yes Lada, Satira Anis, MD  ondansetron (ZOFRAN) 8 MG tablet Take 1 tablet (8 mg total) by mouth 2 (two) times daily as needed for refractory nausea / vomiting. Start on day 3 after chemo. 07/05/16  Yes Nicholas Lose, MD  oxyCODONE (OXY IR/ROXICODONE) 5 MG immediate release tablet Take 1-2 tablets (5-10 mg total) by mouth every 4 (four) hours as needed for moderate pain. 12/18/16  Yes Coralie Keens, MD  pantoprazole (PROTONIX) 40 MG tablet TAKE 1 TABLET BY MOUTH EVERY DAY 02/25/17  Yes Lada, Satira Anis, MD  potassium chloride (KLOR-CON M10)  10 MEQ tablet Take 1 tablet (10 mEq total) by mouth 3 (three) times daily. 07/06/16  Yes Lada, Satira Anis, MD  predniSONE (DELTASONE) 10 MG tablet Take 1 tablet (10 mg total) by mouth daily with breakfast. 6 tabs day 1, 5 day 2; 4 on day 3; 3 on day 4; 2 on day 5; 1 on day 6, 7 05/01/17  Yes Nicholas Lose, MD  prochlorperazine (COMPAZINE) 10 MG tablet Take 1 tablet (10 mg total) by mouth every 6 (six) hours as needed for nausea or vomiting. 07/17/16  Yes Nicholas Lose, MD  promethazine (PHENERGAN) 12.5 MG tablet Take 12.5 mg by mouth every 6 (six) hours as needed for nausea or vomiting.   Yes [provider]  tizanidine (ZANAFLEX) 2 MG capsule Take 1-2 capsules (2-4 mg total) by mouth every 8 (eight) hours as needed for muscle spasms. 01/04/17  Yes Lada, Satira Anis, MD  palbociclib (IBRANCE) 125 MG capsule Take 1 capsule (125 mg total) by mouth daily with breakfast. Take whole with food. Patient not taking: Reported on 04/26/2017 01/21/17   Nicholas Lose, MD  triamcinolone cream (KENALOG) 0.1 % Apply 1 application topically 2 (two) times daily. If needed Patient not taking: Reported on 04/26/2017 11/13/16   Arnetha Courser, MD  warfarin (COUMADIN) 4 MG tablet Take  3 tablets (12 mg total) by mouth daily. Or as otherwise directed by your doctor 12/24/16   Arnetha Courser, MD    Physical Exam: Vitals:   05/13/17 0618 05/13/17 0645 05/13/17 0700 05/13/17 0902  BP: 111/62 (!) 104/55  (!) 147/81  Pulse: 85  81   Resp: 18  16 13   Temp: 97.8 F (36.6 C) 98.1 F (36.7 C)    TempSrc:  Oral    SpO2: 90%  92%   Weight:      Height:         General: Pt appears anxious and despondent in general. Sleeping in bed but easily arousable Eyes:  PERRL, EOMI, normal lids, iris ENT:  grossly normal hearing, lips & tongue, mmm Neck: Trach in place.  no masses or thyromegaly Cardiovascular:  RRR, no m/r/g. No LE edema.  Respiratory:  CTA bilaterally, no w/r/r. Normal respiratory effort. Abdomen:  soft, ntnd, NABS Skin:  no rash or induration seen on limited exam Musculoskeletal: Global weakness of lower extremities but excellent tone. No bony upper body is appreciated. Upper extremities with full range of motion and strength. Psychiatric: Patient with little interest in exam taking the time of admission. Follows commands. Seems somewhat depressed. Neurologic:  CN 2-12 grossly intact, poor sensation of toes bilaterally and feet bilaterally. Patient able to move toes and right foot but has difficulty with moving left foot and toes.   Labs on Admission: I have personally reviewed following labs and imaging studies  CBC:  Recent Labs Lab 05/07/17 1412 05/13/17 0208 05/13/17 0227  WBC 8.3 11.9*  --   NEUTROABS 4.1  --   --   HGB 8.4* 8.3* 7.5*  HCT 25.6* 25.3* 22.0*  MCV 92.1 92.3  --   PLT 19* 6*  --    Basic Metabolic Panel:  Recent Labs Lab 05/07/17 1413 05/13/17 0208 05/13/17 0227  NA 138 134* 136  K 4.0 4.0 4.0  CL  --  104 104  CO2 22 20*  --   GLUCOSE 112 122* 120*  BUN 33.1* 24* 26*  CREATININE 1.8* 2.02* 2.10*  CALCIUM 9.3 10.0  --    GFR: Estimated Creatinine  Clearance: 31.8 mL/min (A) (by C-G formula based on SCr of 2.1 mg/dL  (H)). Liver Function Tests:  Recent Labs Lab 05/07/17 1413  AST 101*  ALT 25  ALKPHOS 216*  BILITOT 0.66  PROT 7.2  ALBUMIN 3.4*   No results for input(s): LIPASE, AMYLASE in the last 168 hours. No results for input(s): AMMONIA in the last 168 hours. Coagulation Profile:  Recent Labs Lab 05/07/17 1402 05/13/17 0211  INR 2.60 1.27  PROTIME 31.2*  --    Cardiac Enzymes: No results for input(s): CKTOTAL, CKMB, CKMBINDEX, TROPONINI in the last 168 hours. BNP (last 3 results) No results for input(s): PROBNP in the last 8760 hours. HbA1C: No results for input(s): HGBA1C in the last 72 hours. CBG: No results for input(s): GLUCAP in the last 168 hours. Lipid Profile: No results for input(s): CHOL, HDL, LDLCALC, TRIG, CHOLHDL, LDLDIRECT in the last 72 hours. Thyroid Function Tests: No results for input(s): TSH, T4TOTAL, FREET4, T3FREE, THYROIDAB in the last 72 hours. Anemia Panel: No results for input(s): VITAMINB12, FOLATE, FERRITIN, TIBC, IRON, RETICCTPCT in the last 72 hours. Urine analysis:    Component Value Date/Time   COLORURINE YELLOW 05/25/2016 1935   APPEARANCEUR CLOUDY (A) 05/25/2016 1935   LABSPEC 1.030 05/25/2016 1935   PHURINE 5.5 05/25/2016 1935   GLUCOSEU NEGATIVE 05/25/2016 1935   GLUCOSEU NEGATIVE 11/30/2013 1455   HGBUR MODERATE (A) 05/25/2016 1935   BILIRUBINUR NEGATIVE 05/25/2016 1935   BILIRUBINUR neg 11/25/2013 1528   KETONESUR NEGATIVE 05/25/2016 1935   PROTEINUR 100 (A) 05/25/2016 1935   UROBILINOGEN 0.2 11/30/2013 1455   NITRITE NEGATIVE 05/25/2016 1935   LEUKOCYTESUR MODERATE (A) 05/25/2016 1935    Creatinine Clearance: Estimated Creatinine Clearance: 31.8 mL/min (A) (by C-G formula based on SCr of 2.1 mg/dL (H)).  Sepsis Labs: @LABRCNTIP (procalcitonin:4,lacticidven:4) ) Recent Results (from the past 240 hour(s))  TECHNOLOGIST REVIEW     Status: None   Collection Time: 05/07/17  2:12 PM  Result Value Ref Range Status   Technologist  Review   Final    1% Blast, 7% metamyelocytes, 3% myelocytes, few shistocytes, helmet cells and teardrop RBCs, moderate polychromasia.     Radiological Exams on Admission: Ct Abdomen Pelvis Wo Contrast  Result Date: 05/13/2017 CLINICAL DATA:  Initial evaluation for acute right-sided chest pain, extending into right and left sites of face with numbness and tingling in right leg. EXAM: CT CHEST, ABDOMEN AND PELVIS WITHOUT CONTRAST CT OF THE THORACIC AND LUMBAR SPINE WITHOUT CONTRAST. TECHNIQUE: Multidetector CT imaging of the chest, abdomen and pelvis as well as the thoracic and lumbar spine was performed following the standard protocol without IV contrast. COMPARISON:  Prior radiograph from earlier same day. FINDINGS: CT CHEST FINDINGS Cardiovascular: Intrathoracic aorta of normal caliber. Scattered atheromatous plaque within the aortic arch. Partially visualized great vessels within normal limits. Heart size within normal limits. No pericardial effusion. Left-sided pacemaker/ AICD in place. Right-sided Port-A-Cath in place as well. Mediastinum/Nodes: Thyroid normal. No pathologically enlarged mediastinal, hilar, or axillary lymph nodes identified. Sequelae of prior right axillary dissection noted. Esophagus within normal limits. Lungs/Pleura: Tracheostomy tube in place within the upper airway. Tracheobronchial tree patent. Hazy atelectatic changes present dependently within the lower lobes bilaterally. No focal infiltrates. No pulmonary edema or pleural effusion. No pneumothorax. No worrisome pulmonary nodule or mass. Musculoskeletal: Sequelae of prior mastectomy noted. Scattered lytic and sclerotic changes within the visualized spine, likely related osseous metastases. No acute osseous abnormality. Cervical ACDF noted. CT ABDOMEN PELVIS FINDINGS Hepatobiliary: Limited noncontrast  evaluation liver unremarkable. Gallbladder within normal limits. No biliary dilatation. Pancreas: Pancreas within normal limits.  Spleen: Spleen within normal limits. Adrenals/Urinary Tract: 2.5 cm intermediate density right adrenal nodule, indeterminate. Adrenal glands otherwise unremarkable. Right kidneys somewhat atrophic as compared to the left. Punctate 2 mm nonobstructive stone present within the lower pole the right kidney. No other radiopaque calculi. No hydronephrosis or hydroureter. Bladder within normal limits. Stomach/Bowel: Stomach within normal limits. No evidence for bowel obstruction. Mild colonic diverticulosis without evidence for acute diverticulitis. No acute inflammatory changes seen about the bowels. No findings to suggest acute appendicitis. 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 No aneurysm. No adenopathy. Flattening of the IVC, suggesting dehydration/ hypovolemia. Reproductive: Uterus is absent. Left ovary within normal limits. Right ovary not discretely identified. Other: No free air or fluid. Diastasis of the rectus abdominis musculature with small fat containing paraumbilical hernia. Musculoskeletal: Diffuse sclerosis with lucent reasons throughout the visualized osseous structures, likely related to osseous metastases. No acute osseous abnormality. CT THORACIC SPINE: Mild dextroscoliosis. Vertebral bodies otherwise normally aligned with preservation of the normal thoracic kyphosis. No listhesis. Cervical ACDF noted. Diffuse abnormal appearance of the visualized osseous structures, likely related to diffuse osseous metastases. No associated pathologic fracture. No appreciable extraosseous tumor or significant epidural tumor. Paraspinous soft tissues within normal limits. Multilevel degenerative endplate spurring seen throughout the thoracic spine, greatest within the midthoracic spine. Scattered posterior endplate marginal osteophytic spurring noted as well. No significant canal stenosis. Mild to moderate bilateral foraminal narrowing noted within the lower thoracic spine at  T10-11, T11-12, and T12-L1. CT LUMBAR SPINE: Normal segmentation. Lowest well-formed disc labeled the L5-S1 level. 4 mm anterolisthesis of L3 on L4 and 2 mm anterolisthesis of L4 on L5. Vertebral bodies otherwise normally aligned with preservation of the normal lumbar lordosis. Widespread osseous metastatic disease seen throughout the visualized osseous structures. No pathologic fracture. No significant extra osseous extension of tumor identified. Paraspinous soft tissues demonstrate no acute abnormality. Visualized sacrum intact. L1-2: Mild disc bulge with facet hypertrophy. No canal stenosis. Mild bilateral L1 foraminal stenosis. L2-3: Diffuse disc bulge with moderate facet hypertrophy. Mild canal stenosis. Mild bilateral L2 foraminal narrowing. L3-4: 4 mm anterolisthesis. Diffuse disc bulge with disc desiccation. Advanced facet arthrosis. Moderate to severe canal and bilateral subarticular stenosis. Moderate to severe bilateral L3 foraminal narrowing, left worse than right. L4-5: 2 mm anterolisthesis of L4 on L5. Associated diffuse disc bulge. Advanced facet arthrosis. Patient status post prior decompression at this level. No residual canal stenosis. Foramina appear patent. L5-S1: L5-S1 interspace largely fused in ankylosed. Prior posterior decompression. Right greater than left facet arthrosis. No canal stenosis. Mild bilateral L5 foraminal narrowing. IMPRESSION: CT CHEST, ABDOMEN, AND PELVIS IMPRESSION: 1. No acute abnormality identified within the chest, abdomen, and pelvis. 2. Diffuse abnormal appearance of the visualized osseous structures, consistent with diffuse osseous metastases. 3. Chronic right renal atrophy with associated 2 mm nonobstructive nephrolithiasis. 4. Mild colonic diverticulosis without evidence for acute diverticulitis. 5. 2.5 cm right adrenal nodule, indeterminate. Follow-up exam with dedicated adrenal mass protocol CT and/or MRI suggested for full characterization. 6. Sequelae of prior  mastectomy with right axillary nodal dissection. 7. Tracheostomy tube in place in the upper airway. CT THORACIC AND LUMBAR SPINE IMPRESSION: 1. No acute abnormality within the thoracic and lumbar spine. 2. Diffuse osseous metastatic disease. No associated pathologic fracture. No appreciable extra osseous or epidural tumor identified on this noncontrast examination. 3. Multilevel degenerative spondylolysis as detailed above, greatest within the lower lumbar  spine at the L3-4 level. Please see above report for a full description of these findings. Electronically Signed   By: Jeannine Boga M.D.   On: 05/13/2017 05:00   Ct Head Wo Contrast  Result Date: 05/13/2017 CLINICAL DATA:  Initial evaluation for acute numbness, tingling, paresthesias. EXAM: CT HEAD WITHOUT CONTRAST TECHNIQUE: Contiguous axial images were obtained from the base of the skull through the vertex without intravenous contrast. COMPARISON:  None available. FINDINGS: Brain: Cerebral volume within normal limits. There is an acute subdural hematoma seen at the anterior inferior falx measuring up to 3 mm in maximal thickness. Additional patchy small volume extra-axial hemorrhage overlies the anterior parasagittal left frontal convexity (series 3, image 23). No significant mass effect. No made of an additional subacute to chronic appearing subdural collection overlying the left parietal convexity measuring up to 5 mm in maximal thickness. No midline shift. No hydrocephalus or ventricular trapping. Basilar cisterns remain patent. No other acute intracranial hemorrhage. No acute large vessel territory infarct. No mass lesion. Vascular: No hyperdense vessel. Scattered vascular calcifications noted within the carotid siphons. Skull: Scalp soft tissues within normal limits.  Calvarium intact. Sinuses/Orbits: Globes and orbital soft tissues within normal limits. Scattered mucosal thickening within the sphenoid sinuses. Paranasal sinuses are otherwise  clear. No mastoid effusion. IMPRESSION: 1. Acute small volume subdural hemorrhage positioned along the anterior inferior falx and overlying the left anterior cerebral convexity. No significant mass effect. 2. Additional subacute to chronic left subdural collection overlying the left parietal convexity without significant mass effect. Critical Value/emergent results were called by telephone at the time of interpretation on 05/13/2017 at 5:09 am to Dr. Ripley Fraise , who verbally acknowledged these results. Electronically Signed   By: Jeannine Boga M.D.   On: 05/13/2017 05:14   Ct Chest Wo Contrast  Result Date: 05/13/2017 CLINICAL DATA:  Initial evaluation for acute right-sided chest pain, extending into right and left sites of face with numbness and tingling in right leg. EXAM: CT CHEST, ABDOMEN AND PELVIS WITHOUT CONTRAST CT OF THE THORACIC AND LUMBAR SPINE WITHOUT CONTRAST. TECHNIQUE: Multidetector CT imaging of the chest, abdomen and pelvis as well as the thoracic and lumbar spine was performed following the standard protocol without IV contrast. COMPARISON:  Prior radiograph from earlier same day. FINDINGS: CT CHEST FINDINGS Cardiovascular: Intrathoracic aorta of normal caliber. Scattered atheromatous plaque within the aortic arch. Partially visualized great vessels within normal limits. Heart size within normal limits. No pericardial effusion. Left-sided pacemaker/ AICD in place. Right-sided Port-A-Cath in place as well. Mediastinum/Nodes: Thyroid normal. No pathologically enlarged mediastinal, hilar, or axillary lymph nodes identified. Sequelae of prior right axillary dissection noted. Esophagus within normal limits. Lungs/Pleura: Tracheostomy tube in place within the upper airway. Tracheobronchial tree patent. Hazy atelectatic changes present dependently within the lower lobes bilaterally. No focal infiltrates. No pulmonary edema or pleural effusion. No pneumothorax. No worrisome pulmonary nodule  or mass. Musculoskeletal: Sequelae of prior mastectomy noted. Scattered lytic and sclerotic changes within the visualized spine, likely related osseous metastases. No acute osseous abnormality. Cervical ACDF noted. CT ABDOMEN PELVIS FINDINGS Hepatobiliary: Limited noncontrast evaluation liver unremarkable. Gallbladder within normal limits. No biliary dilatation. Pancreas: Pancreas within normal limits. Spleen: Spleen within normal limits. Adrenals/Urinary Tract: 2.5 cm intermediate density right adrenal nodule, indeterminate. Adrenal glands otherwise unremarkable. Right kidneys somewhat atrophic as compared to the left. Punctate 2 mm nonobstructive stone present within the lower pole the right kidney. No other radiopaque calculi. No hydronephrosis or hydroureter. Bladder within normal limits. Stomach/Bowel:  Stomach within normal limits. No evidence for bowel obstruction. Mild colonic diverticulosis without evidence for acute diverticulitis. No acute inflammatory changes seen about the bowels. No findings to suggest acute appendicitis. 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 No aneurysm. No adenopathy. Flattening of the IVC, suggesting dehydration/ hypovolemia. Reproductive: Uterus is absent. Left ovary within normal limits. Right ovary not discretely identified. Other: No free air or fluid. Diastasis of the rectus abdominis musculature with small fat containing paraumbilical hernia. Musculoskeletal: Diffuse sclerosis with lucent reasons throughout the visualized osseous structures, likely related to osseous metastases. No acute osseous abnormality. CT THORACIC SPINE: Mild dextroscoliosis. Vertebral bodies otherwise normally aligned with preservation of the normal thoracic kyphosis. No listhesis. Cervical ACDF noted. Diffuse abnormal appearance of the visualized osseous structures, likely related to diffuse osseous metastases. No associated pathologic fracture. No appreciable  extraosseous tumor or significant epidural tumor. Paraspinous soft tissues within normal limits. Multilevel degenerative endplate spurring seen throughout the thoracic spine, greatest within the midthoracic spine. Scattered posterior endplate marginal osteophytic spurring noted as well. No significant canal stenosis. Mild to moderate bilateral foraminal narrowing noted within the lower thoracic spine at T10-11, T11-12, and T12-L1. CT LUMBAR SPINE: Normal segmentation. Lowest well-formed disc labeled the L5-S1 level. 4 mm anterolisthesis of L3 on L4 and 2 mm anterolisthesis of L4 on L5. Vertebral bodies otherwise normally aligned with preservation of the normal lumbar lordosis. Widespread osseous metastatic disease seen throughout the visualized osseous structures. No pathologic fracture. No significant extra osseous extension of tumor identified. Paraspinous soft tissues demonstrate no acute abnormality. Visualized sacrum intact. L1-2: Mild disc bulge with facet hypertrophy. No canal stenosis. Mild bilateral L1 foraminal stenosis. L2-3: Diffuse disc bulge with moderate facet hypertrophy. Mild canal stenosis. Mild bilateral L2 foraminal narrowing. L3-4: 4 mm anterolisthesis. Diffuse disc bulge with disc desiccation. Advanced facet arthrosis. Moderate to severe canal and bilateral subarticular stenosis. Moderate to severe bilateral L3 foraminal narrowing, left worse than right. L4-5: 2 mm anterolisthesis of L4 on L5. Associated diffuse disc bulge. Advanced facet arthrosis. Patient status post prior decompression at this level. No residual canal stenosis. Foramina appear patent. L5-S1: L5-S1 interspace largely fused in ankylosed. Prior posterior decompression. Right greater than left facet arthrosis. No canal stenosis. Mild bilateral L5 foraminal narrowing. IMPRESSION: CT CHEST, ABDOMEN, AND PELVIS IMPRESSION: 1. No acute abnormality identified within the chest, abdomen, and pelvis. 2. Diffuse abnormal appearance of  the visualized osseous structures, consistent with diffuse osseous metastases. 3. Chronic right renal atrophy with associated 2 mm nonobstructive nephrolithiasis. 4. Mild colonic diverticulosis without evidence for acute diverticulitis. 5. 2.5 cm right adrenal nodule, indeterminate. Follow-up exam with dedicated adrenal mass protocol CT and/or MRI suggested for full characterization. 6. Sequelae of prior mastectomy with right axillary nodal dissection. 7. Tracheostomy tube in place in the upper airway. CT THORACIC AND LUMBAR SPINE IMPRESSION: 1. No acute abnormality within the thoracic and lumbar spine. 2. Diffuse osseous metastatic disease. No associated pathologic fracture. No appreciable extra osseous or epidural tumor identified on this noncontrast examination. 3. Multilevel degenerative spondylolysis as detailed above, greatest within the lower lumbar spine at the L3-4 level. Please see above report for a full description of these findings. Electronically Signed   By: Jeannine Boga M.D.   On: 05/13/2017 05:00   Ct T-spine No Charge  Result Date: 05/13/2017 CLINICAL DATA:  Initial evaluation for acute right-sided chest pain, extending into right and left sites of face with numbness and tingling in right leg. EXAM: CT CHEST, ABDOMEN AND  PELVIS WITHOUT CONTRAST CT OF THE THORACIC AND LUMBAR SPINE WITHOUT CONTRAST. TECHNIQUE: Multidetector CT imaging of the chest, abdomen and pelvis as well as the thoracic and lumbar spine was performed following the standard protocol without IV contrast. COMPARISON:  Prior radiograph from earlier same day. FINDINGS: CT CHEST FINDINGS Cardiovascular: Intrathoracic aorta of normal caliber. Scattered atheromatous plaque within the aortic arch. Partially visualized great vessels within normal limits. Heart size within normal limits. No pericardial effusion. Left-sided pacemaker/ AICD in place. Right-sided Port-A-Cath in place as well. Mediastinum/Nodes: Thyroid normal. No  pathologically enlarged mediastinal, hilar, or axillary lymph nodes identified. Sequelae of prior right axillary dissection noted. Esophagus within normal limits. Lungs/Pleura: Tracheostomy tube in place within the upper airway. Tracheobronchial tree patent. Hazy atelectatic changes present dependently within the lower lobes bilaterally. No focal infiltrates. No pulmonary edema or pleural effusion. No pneumothorax. No worrisome pulmonary nodule or mass. Musculoskeletal: Sequelae of prior mastectomy noted. Scattered lytic and sclerotic changes within the visualized spine, likely related osseous metastases. No acute osseous abnormality. Cervical ACDF noted. CT ABDOMEN PELVIS FINDINGS Hepatobiliary: Limited noncontrast evaluation liver unremarkable. Gallbladder within normal limits. No biliary dilatation. Pancreas: Pancreas within normal limits. Spleen: Spleen within normal limits. Adrenals/Urinary Tract: 2.5 cm intermediate density right adrenal nodule, indeterminate. Adrenal glands otherwise unremarkable. Right kidneys somewhat atrophic as compared to the left. Punctate 2 mm nonobstructive stone present within the lower pole the right kidney. No other radiopaque calculi. No hydronephrosis or hydroureter. Bladder within normal limits. Stomach/Bowel: Stomach within normal limits. No evidence for bowel obstruction. Mild colonic diverticulosis without evidence for acute diverticulitis. No acute inflammatory changes seen about the bowels. No findings to suggest acute appendicitis. 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 No aneurysm. No adenopathy. Flattening of the IVC, suggesting dehydration/ hypovolemia. Reproductive: Uterus is absent. Left ovary within normal limits. Right ovary not discretely identified. Other: No free air or fluid. Diastasis of the rectus abdominis musculature with small fat containing paraumbilical hernia. Musculoskeletal: Diffuse sclerosis with lucent reasons  throughout the visualized osseous structures, likely related to osseous metastases. No acute osseous abnormality. CT THORACIC SPINE: Mild dextroscoliosis. Vertebral bodies otherwise normally aligned with preservation of the normal thoracic kyphosis. No listhesis. Cervical ACDF noted. Diffuse abnormal appearance of the visualized osseous structures, likely related to diffuse osseous metastases. No associated pathologic fracture. No appreciable extraosseous tumor or significant epidural tumor. Paraspinous soft tissues within normal limits. Multilevel degenerative endplate spurring seen throughout the thoracic spine, greatest within the midthoracic spine. Scattered posterior endplate marginal osteophytic spurring noted as well. No significant canal stenosis. Mild to moderate bilateral foraminal narrowing noted within the lower thoracic spine at T10-11, T11-12, and T12-L1. CT LUMBAR SPINE: Normal segmentation. Lowest well-formed disc labeled the L5-S1 level. 4 mm anterolisthesis of L3 on L4 and 2 mm anterolisthesis of L4 on L5. Vertebral bodies otherwise normally aligned with preservation of the normal lumbar lordosis. Widespread osseous metastatic disease seen throughout the visualized osseous structures. No pathologic fracture. No significant extra osseous extension of tumor identified. Paraspinous soft tissues demonstrate no acute abnormality. Visualized sacrum intact. L1-2: Mild disc bulge with facet hypertrophy. No canal stenosis. Mild bilateral L1 foraminal stenosis. L2-3: Diffuse disc bulge with moderate facet hypertrophy. Mild canal stenosis. Mild bilateral L2 foraminal narrowing. L3-4: 4 mm anterolisthesis. Diffuse disc bulge with disc desiccation. Advanced facet arthrosis. Moderate to severe canal and bilateral subarticular stenosis. Moderate to severe bilateral L3 foraminal narrowing, left worse than right. L4-5: 2 mm anterolisthesis of L4 on L5. Associated diffuse  disc bulge. Advanced facet arthrosis.  Patient status post prior decompression at this level. No residual canal stenosis. Foramina appear patent. L5-S1: L5-S1 interspace largely fused in ankylosed. Prior posterior decompression. Right greater than left facet arthrosis. No canal stenosis. Mild bilateral L5 foraminal narrowing. IMPRESSION: CT CHEST, ABDOMEN, AND PELVIS IMPRESSION: 1. No acute abnormality identified within the chest, abdomen, and pelvis. 2. Diffuse abnormal appearance of the visualized osseous structures, consistent with diffuse osseous metastases. 3. Chronic right renal atrophy with associated 2 mm nonobstructive nephrolithiasis. 4. Mild colonic diverticulosis without evidence for acute diverticulitis. 5. 2.5 cm right adrenal nodule, indeterminate. Follow-up exam with dedicated adrenal mass protocol CT and/or MRI suggested for full characterization. 6. Sequelae of prior mastectomy with right axillary nodal dissection. 7. Tracheostomy tube in place in the upper airway. CT THORACIC AND LUMBAR SPINE IMPRESSION: 1. No acute abnormality within the thoracic and lumbar spine. 2. Diffuse osseous metastatic disease. No associated pathologic fracture. No appreciable extra osseous or epidural tumor identified on this noncontrast examination. 3. Multilevel degenerative spondylolysis as detailed above, greatest within the lower lumbar spine at the L3-4 level. Please see above report for a full description of these findings. Electronically Signed   By: Jeannine Boga M.D.   On: 05/13/2017 05:00   Ct L-spine No Charge  Result Date: 05/13/2017 CLINICAL DATA:  Initial evaluation for acute right-sided chest pain, extending into right and left sites of face with numbness and tingling in right leg. EXAM: CT CHEST, ABDOMEN AND PELVIS WITHOUT CONTRAST CT OF THE THORACIC AND LUMBAR SPINE WITHOUT CONTRAST. TECHNIQUE: Multidetector CT imaging of the chest, abdomen and pelvis as well as the thoracic and lumbar spine was performed following the standard  protocol without IV contrast. COMPARISON:  Prior radiograph from earlier same day. FINDINGS: CT CHEST FINDINGS Cardiovascular: Intrathoracic aorta of normal caliber. Scattered atheromatous plaque within the aortic arch. Partially visualized great vessels within normal limits. Heart size within normal limits. No pericardial effusion. Left-sided pacemaker/ AICD in place. Right-sided Port-A-Cath in place as well. Mediastinum/Nodes: Thyroid normal. No pathologically enlarged mediastinal, hilar, or axillary lymph nodes identified. Sequelae of prior right axillary dissection noted. Esophagus within normal limits. Lungs/Pleura: Tracheostomy tube in place within the upper airway. Tracheobronchial tree patent. Hazy atelectatic changes present dependently within the lower lobes bilaterally. No focal infiltrates. No pulmonary edema or pleural effusion. No pneumothorax. No worrisome pulmonary nodule or mass. Musculoskeletal: Sequelae of prior mastectomy noted. Scattered lytic and sclerotic changes within the visualized spine, likely related osseous metastases. No acute osseous abnormality. Cervical ACDF noted. CT ABDOMEN PELVIS FINDINGS Hepatobiliary: Limited noncontrast evaluation liver unremarkable. Gallbladder within normal limits. No biliary dilatation. Pancreas: Pancreas within normal limits. Spleen: Spleen within normal limits. Adrenals/Urinary Tract: 2.5 cm intermediate density right adrenal nodule, indeterminate. Adrenal glands otherwise unremarkable. Right kidneys somewhat atrophic as compared to the left. Punctate 2 mm nonobstructive stone present within the lower pole the right kidney. No other radiopaque calculi. No hydronephrosis or hydroureter. Bladder within normal limits. Stomach/Bowel: Stomach within normal limits. No evidence for bowel obstruction. Mild colonic diverticulosis without evidence for acute diverticulitis. No acute inflammatory changes seen about the bowels. No findings to suggest acute  appendicitis. 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 No aneurysm. No adenopathy. Flattening of the IVC, suggesting dehydration/ hypovolemia. Reproductive: Uterus is absent. Left ovary within normal limits. Right ovary not discretely identified. Other: No free air or fluid. Diastasis of the rectus abdominis musculature with small fat containing paraumbilical hernia. Musculoskeletal: Diffuse sclerosis with lucent  reasons throughout the visualized osseous structures, likely related to osseous metastases. No acute osseous abnormality. CT THORACIC SPINE: Mild dextroscoliosis. Vertebral bodies otherwise normally aligned with preservation of the normal thoracic kyphosis. No listhesis. Cervical ACDF noted. Diffuse abnormal appearance of the visualized osseous structures, likely related to diffuse osseous metastases. No associated pathologic fracture. No appreciable extraosseous tumor or significant epidural tumor. Paraspinous soft tissues within normal limits. Multilevel degenerative endplate spurring seen throughout the thoracic spine, greatest within the midthoracic spine. Scattered posterior endplate marginal osteophytic spurring noted as well. No significant canal stenosis. Mild to moderate bilateral foraminal narrowing noted within the lower thoracic spine at T10-11, T11-12, and T12-L1. CT LUMBAR SPINE: Normal segmentation. Lowest well-formed disc labeled the L5-S1 level. 4 mm anterolisthesis of L3 on L4 and 2 mm anterolisthesis of L4 on L5. Vertebral bodies otherwise normally aligned with preservation of the normal lumbar lordosis. Widespread osseous metastatic disease seen throughout the visualized osseous structures. No pathologic fracture. No significant extra osseous extension of tumor identified. Paraspinous soft tissues demonstrate no acute abnormality. Visualized sacrum intact. L1-2: Mild disc bulge with facet hypertrophy. No canal stenosis. Mild bilateral L1 foraminal  stenosis. L2-3: Diffuse disc bulge with moderate facet hypertrophy. Mild canal stenosis. Mild bilateral L2 foraminal narrowing. L3-4: 4 mm anterolisthesis. Diffuse disc bulge with disc desiccation. Advanced facet arthrosis. Moderate to severe canal and bilateral subarticular stenosis. Moderate to severe bilateral L3 foraminal narrowing, left worse than right. L4-5: 2 mm anterolisthesis of L4 on L5. Associated diffuse disc bulge. Advanced facet arthrosis. Patient status post prior decompression at this level. No residual canal stenosis. Foramina appear patent. L5-S1: L5-S1 interspace largely fused in ankylosed. Prior posterior decompression. Right greater than left facet arthrosis. No canal stenosis. Mild bilateral L5 foraminal narrowing. IMPRESSION: CT CHEST, ABDOMEN, AND PELVIS IMPRESSION: 1. No acute abnormality identified within the chest, abdomen, and pelvis. 2. Diffuse abnormal appearance of the visualized osseous structures, consistent with diffuse osseous metastases. 3. Chronic right renal atrophy with associated 2 mm nonobstructive nephrolithiasis. 4. Mild colonic diverticulosis without evidence for acute diverticulitis. 5. 2.5 cm right adrenal nodule, indeterminate. Follow-up exam with dedicated adrenal mass protocol CT and/or MRI suggested for full characterization. 6. Sequelae of prior mastectomy with right axillary nodal dissection. 7. Tracheostomy tube in place in the upper airway. CT THORACIC AND LUMBAR SPINE IMPRESSION: 1. No acute abnormality within the thoracic and lumbar spine. 2. Diffuse osseous metastatic disease. No associated pathologic fracture. No appreciable extra osseous or epidural tumor identified on this noncontrast examination. 3. Multilevel degenerative spondylolysis as detailed above, greatest within the lower lumbar spine at the L3-4 level. Please see above report for a full description of these findings. Electronically Signed   By: Jeannine Boga M.D.   On: 05/13/2017 05:00    Dg Chest Portable 1 View  Result Date: 05/13/2017 CLINICAL DATA:  Acute onset of generalized chest pain. Initial encounter. EXAM: PORTABLE CHEST 1 VIEW COMPARISON:  PET/CT performed 01/14/2017, and chest radiograph performed 07/18/2016 FINDINGS: The lungs are well-aerated. Mild bibasilar airspace opacities may reflect atelectasis or possibly mild pneumonia. There is no evidence of pleural effusion or pneumothorax. The cardiomediastinal silhouette is borderline normal in size. A pacemaker is noted overlying the left chest wall, with leads ending at the right atrium and over the right ventricle. No acute osseous abnormalities are seen. The patient's tracheostomy tube is seen ending 7 cm above the carina. Cervical spinal fusion hardware is noted. IMPRESSION: Mild bibasilar airspace opacities may reflect atelectasis or possibly mild pneumonia.  Electronically Signed   By: Garald Balding M.D.   On: 05/13/2017 02:13    EKG: Independently reviewed. Sinus. No ACS  Assessment/Plan Active Problems:   Artificial cardiac pacemaker   Tracheostomy status ([name])   Chronic pain due to injury   Atrial fibrillation, chronic ([name])   Chronic kidney disease (CKD), stage III (moderate)   Breast cancer of upper-inner quadrant of right female breast ([name])   Anemia due to antineoplastic chemotherapy   Acute subdural hematoma ([name])   Thrombocytopenia ([name])   Bilateral leg numbness   Intracranial hemorrhage: Acute SDH at the anterior inferior falx measuring 52mm and additional small volume extra-axial hemorrhage overlying the anterior parasagittal L frontal convexity. Unclear if these are causing pts current sx of LE numbness/weakness. Neurosurgery following and appreciate their input. Pt is not a candidate for MRI. Profound thrombocytopenia likely contributing. No h/o Head trauma. H/o CVA.  - Neuro checks Q1-2 - repeat CT at 20:00 - Plt transfusion as below.  - Keppra 500 BID x7 days for szr prophylaxis - Coumadin on  hold  LE numbness and weakness: Unclear etiology ICH outlined above not likely directly correlated. Unable to obtain MRI to r/o no spinal metastatic lesions. No Szr activity, significant metabolic etiology. Doubt CNS infectious process. Doubt this is related to chemo since last treatment was nearly 4 mo ago. Discussed case w/ Neurohospitalist who recommends C-spine and T-spine CT myelogram when platelet count gets above 50. This would better show metastatic disease since MRI is not available.  - Neuro checks as above - C&T spine CT Myelogram if sx persist and when Plt count gets above 50.  - PT/OT  Thrombocytopenia: Plt count 6 w/ ICH. Given plt transfusion in ED. Pt underwent additional plt transfusion on 05/07/17 for thrombocytopenia (plt count 19) per oncology notes. Completed her steroid taper course on the day prior to admission. Dr. Lindi Adie consulted and will see at [facility].  - H/O consult due to continued thrombocytopenia despite recent transfusion, anemia, and metastatic breast cancer - f/u post transfusion labs.   Tracheostomy: permanent.  - Trach care per floor protocol  Afib: s/p pacemaker. INR 1.27. Off coumadin for ~1wk.  - Tele - continue off coumadin until plts return to nml.   R breast cancer: T4 N1M1 stage IV. Diffuse bony mets again noted on imaging in the ED. Followed by oncology. R mastectomy 12/13/16. Last Chemo 01/21/17.  - Continue treatment per oncology - continue Femara  CP: sounds more anxiety induced than true ACS/CAD. EKG and Trop reassuring.  - tele - Cycle trop x2 more  Chronic pain: - continue neurontin, oxycodone  HTN: currently normal to low bp.  - Hold home HCTZ, Losartan - Hydralazine prn for SBP >160  Hyperglycemia: 122.  - A1c,   Infectious: unclear if any infectious etiology. Afebrile w/ soft BP. H/o not concerning for infectious process. WBC 11.6. Given Vanc and Cefepime in ED.  - UA for completeness.  - Hold further ABX.   GERD: - continue  ppi  Peripheral neuropathy: - continue neurontin  Chronic pain: - continue home Oxy w/ PRN Morphine  CKD: Cr 2.1. At baseline.  - BMP in am  Anemia: Hgb 8.3. Appears at baseline given recent trend.  - CBC in am - H/O consult as above   DVT prophylaxis: SCD  Code Status: FUll  Family Communication: none  Disposition Plan: pending improvement from a neuro standpoint and nml plt count  Consults called: H/O, Neurosurgery  Admission status: inpt  Anael Rosch J MD [facility]  If 7PM-7AM, please contact night-coverage [email or web address] Password [email or web address]  05/13/2017, 9:40 AM

## 2017-05-13 NOTE — Progress Notes (Signed)
CRITICAL VALUE ALERT  Critical Value:  Positive Troponin 0.03  Date & Time Notied:  05/13/2017 1137  Provider Notified: Dr. Marily Memos   Orders Received/Actions taken: none taken

## 2017-05-13 NOTE — Care Management Note (Signed)
Case Management Note  Patient Details  Name: [name] MRN: [id number] Date of Birth: 10/10/1950  Subjective/Objective:   Pt admitted with chest tightness and numbness  - non operative subdural               Action/Plan:   PTA from home independent - per pt she has had trach for approximately 10 years.  Pt informed CM that she is self sufficient with trach and receives trach supplies from [facility].     Expected Discharge Date:                  Expected Discharge Plan:     In-House Referral:     Discharge planning Services  CM Consult  Post Acute Care Choice:    Choice offered to:     DME Arranged:    DME Agency:     HH Arranged:    HH Agency:     Status of Service:     If discussed at [employer], dates discussed:    Additional Comments:  Maryclare Labrador, RN 05/13/2017, 4:34 PM

## 2017-05-13 NOTE — Progress Notes (Signed)
Pt, on coumadin, with multiple small SDH. Reportedly neuro intact These small SDH are nonsurgical and should heal with time Has multiple other medical issues including platelet count of 6. Will need platelet transfusion Rec admission under hospitalist service - step down unit - Keppra 500mg  BID x7days for seizure prophylaxis - Monitor neuro exam q 1-2 hours - Repeat head CT in 12-24 hours Full consult note to follow.

## 2017-05-13 NOTE — ED Notes (Signed)
Delay in lab draw,  Xray in room. 

## 2017-05-13 NOTE — Telephone Encounter (Signed)
Received call from [facility] pt friend that pt was currently admitted with "bleeding on the brain" and that the pt wants to see Dr. Lindi Adie. Informed Lenor that he is aware that she is admitted and will go see her after he finishes seeing his pt in clinic. Received verbal understanding.

## 2017-05-13 NOTE — ED Notes (Signed)
Pt to CT via stretcher, chaplain accompanying patient.

## 2017-05-13 NOTE — Progress Notes (Signed)
   05/13/17 0500  Clinical Encounter Type  Visited With Patient  Visit Type Psychological support;Spiritual support;ED  Referral From Nurse  Consult/Referral To Chaplain  Spiritual Encounters  Spiritual Needs Literature;Prayer;Emotional  Stress Factors  Patient Stress Factors Exhausted;Health changes;Loss of control   Chaplain was paged to offer emotional and spiritual support. Chaplain read biblical text per request of Pt and provided prayer. Chaplain reached out to Pt's emergency contact.Rigoberto Noel [phone])

## 2017-05-13 NOTE — Consult Note (Signed)
Chief Complaint   Chief Complaint  Patient presents with  . Chest Pain    HPI   HPI: [name] is a 66 y.o. female who presented to the ER earlier this am for multitude of problems including:  Numbness in her bilateral lower extremities, chest tightness,  Abnormalities with her pacemaker as well as headaches. Consult requested by: Dr Christy Gentles Reason for consult: SDH  [name] has a medical history significant for  CVA, permanent tracheostomy, hypertension, CKD, CHF, metastatic breast cancer, atrial fibrillation with pacemaker and brain aneurysm.   I am unable to obtain a full history as patient is not cooperative with questioning and is only concerned about the fact that her platelet count is low.   She does tell me that she has been having bilateral lower extremity numbness and tingling over the past 2 weeks.  She reports the symptoms are slowly improving.   She is unsure if she has that having any back pain.  She is having some chest pain which started after issue relates her legs are numb.  She refuses to answer any more questions with the exception of asking why her platelet count is low.  Patient Active Problem List   Diagnosis Date Noted  . Acute subdural hematoma ([place]) 05/13/2017  . Thrombocytopenia ([place]) 05/13/2017  . Bilateral leg numbness 05/13/2017  . Malignant neoplasm of upper-outer quadrant of right breast in female, estrogen receptor positive ([place]) 01/21/2017  . Breast cancer ([place]) 12/13/2016  . Decreased breath sounds at left lung base 11/13/2016  . Anemia due to antineoplastic chemotherapy 09/25/2016  . Anemia 09/25/2016  . Port catheter in place 08/31/2016  . Breast cancer of upper-inner quadrant of right female breast ([place]) 07/05/2016  . Hypokalemia 05/30/2016  . Mass of breast, right 05/30/2016  . Proptosis 05/30/2016  . Chronic kidney disease (CKD), stage III (moderate) 08/11/2015  . Cramp in muscle 07/11/2015  . H/O injury, presenting hazards to health  07/11/2015  . Esophagitis, reflux 07/11/2015  . Chronic pain due to injury 07/11/2015  . Chronic hypokalemia 07/11/2015  . Atrial fibrillation, chronic ([place]) 07/11/2015  . CAD in native artery 07/11/2015  . Erythema nodosum, acute form 07/11/2015  . Pain in joint of left wrist 07/11/2015  . Hypertension goal BP (blood pressure) < 140/90 01/04/2014  . Glaucoma 01/04/2014  . 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 Inflammation of uveal tract 01/04/2014  . 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 Tracheostomy status ([place]) 09/23/2012  . Aneurysm, cerebral 08/13/2012  . Cerebral vascular accident ([place]) 08/13/2012  . 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 Encounter for monitoring coumadin therapy 06/11/2012    PMH: Past Medical History:  Diagnosis Date  . Aneurysm ([place]) 1998   Brain  . Anticoagulated on Coumadin   . Arthritis   . Atherosclerosis of coronary artery of native heart without angina pectoris   . Atrial fibrillation, chronic ([place])   . 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 Breast cancer ([place]) 06/21/2016   Right breast  . CHF (congestive heart failure) ([place])   . Chronic hypokalemia   . Chronic kidney disease    stage III  . Chronic pain due to trauma   . Difficult intubation    permanent tracheostomy;lungs filled with water when extubated  . Frequent headaches   . GERD (gastroesophageal reflux disease)   . History of stomach ulcers   . Hypertension   . Hypertension, goal below 140/90   . Myocardial infarction ([place])   .  PONV (postoperative nausea and vomiting)   . 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 Spinal headache   . 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 Trachea, ring   . Tracheostomy in place [facility])   . Urinary tract infection, recurrent   . Urine incontinence     PSH: Past Surgical History:  Procedure  Laterality Date  . ABDOMINAL HYSTERECTOMY    . BACK SURGERY    . BLADDER SURGERY     hitch  . CHOLECYSTECTOMY    . FRACTURE SURGERY     B/L feet and right ankle  . GANGLION CYST EXCISION  2011  . MASTECTOMY MODIFIED RADICAL Bilateral 12/13/2016   Procedure: RIGHT MODIFIED RADICAL MASTECTOMY, LEFT SIMPLE MASTECTOMY;  Surgeon: Coralie Keens, MD;  Location: [place];  Service: General;  Laterality: Bilateral;  . PACEMAKER INSERTION  10/29/2005  . PACEMAKER PLACEMENT  08/22/2012  . PORT A CATH INJECTION ([place] [facility])  07/18/2016  . PORTACATH PLACEMENT Right 07/18/2016   Procedure: INSERTION PORT-A-CATH;  Surgeon: Coralie Keens, MD;  Location: [place];  Service: General;  Laterality: Right;  . TRACHEAL SURGERY  2000  . UVULOPALATOPHARYNGOPLASTY      Prescriptions Prior to Admission  Medication Sig Dispense Refill Last Dose  . cholecalciferol (VITAMIN D) 1000 UNITS tablet Take 1,000 Units by mouth daily.   05/12/2017 at Unknown time  . gabapentin (NEURONTIN) 300 MG capsule TAKE 1 CAPSULE (300 MG TOTAL) BY MOUTH 3 (THREE) TIMES DAILY. 90 capsule 2 05/12/2017 at Unknown time  . hydrochlorothiazide (HYDRODIURIL) 12.5 MG tablet TAKE 1 TABLET (12.5 MG TOTAL) BY MOUTH DAILY. 90 tablet 1 05/12/2017 at Unknown time  . latanoprost (XALATAN) 0.005 % ophthalmic solution Place 1 drop into both eyes at bedtime.    Past Week at Unknown time  . letrozole (FEMARA) 2.5 MG tablet Take 1 tablet (2.5 mg total) by mouth daily. 90 tablet 3 05/12/2017 at Unknown time  . lidocaine-prilocaine (EMLA) cream Apply to affected area once (Patient taking differently: Apply 1 application topically daily as needed (prior to port access). Apply to affected area once) 30 g 3 unk  . losartan (COZAAR) 100 MG tablet Take 1 tablet (100 mg total) by mouth daily. (Patient taking differently: Take 100 mg by mouth at bedtime. ) 30 tablet 5 05/12/2017 at Unknown time  . ondansetron (ZOFRAN) 8 MG tablet Take 1 tablet (8 mg total) by mouth 2 (two) times  daily as needed for refractory nausea / vomiting. Start on day 3 after chemo. 30 tablet 1 unk  . oxyCODONE (OXY IR/ROXICODONE) 5 MG immediate release tablet Take 1-2 tablets (5-10 mg total) by mouth every 4 (four) hours as needed for moderate pain. 40 tablet 0 unk  . pantoprazole (PROTONIX) 40 MG tablet TAKE 1 TABLET BY MOUTH EVERY DAY 90 tablet 0 05/12/2017 at Unknown time  . potassium chloride (KLOR-CON M10) 10 MEQ tablet Take 1 tablet (10 mEq total) by mouth 3 (three) times daily. 270 tablet 1 05/12/2017 at Unknown time  . predniSONE (DELTASONE) 10 MG tablet Take 1 tablet (10 mg total) by mouth daily with breakfast. 6 tabs day 1, 5 day 2; 4 on day 3; 3 on day 4; 2 on day 5; 1 on day 6, 7 50 tablet 0 05/12/2017 at Unknown time  . prochlorperazine (COMPAZINE) 10 MG tablet Take 1 tablet (10 mg total) by mouth every 6 (six) hours as  needed for nausea or vomiting. 30 tablet 3 unk  . promethazine (PHENERGAN) 12.5 MG tablet Take 12.5 mg by mouth every 6 (six) hours as needed for nausea or vomiting.   unk  . tizanidine (ZANAFLEX) 2 MG capsule Take 1-2 capsules (2-4 mg total) by mouth every 8 (eight) hours as needed for muscle spasms. 40 capsule 1 unk  . palbociclib (IBRANCE) 125 MG capsule Take 1 capsule (125 mg total) by mouth daily with breakfast. Take whole with food. (Patient not taking: Reported on 04/26/2017) 21 capsule 1 Not Taking at Unknown time  . triamcinolone cream (KENALOG) 0.1 % Apply 1 application topically 2 (two) times daily. If needed (Patient not taking: Reported on 04/26/2017) 45 g 1 Not Taking at Unknown time  . warfarin (COUMADIN) 4 MG tablet Take 3 tablets (12 mg total) by mouth daily. Or as otherwise directed by your doctor 270 tablet 1 05/07/2017    SH: Social History  Substance Use Topics  . Smoking status: Current Some Day Smoker    Years: 40.00    Types: Cigarettes  . Smokeless tobacco: Never Used     Comment: 3 a week.  pt can't remember when she had one last  . Alcohol use No     MEDS: Prior to Admission medications   Medication Sig Start Date End Date Taking? Authorizing Provider  cholecalciferol (VITAMIN D) 1000 UNITS tablet Take 1,000 Units by mouth daily.   Yes [provider]  gabapentin (NEURONTIN) 300 MG capsule TAKE 1 CAPSULE (300 MG TOTAL) BY MOUTH 3 (THREE) TIMES DAILY. 01/22/17  Yes Lada, Satira Anis, MD  hydrochlorothiazide (HYDRODIURIL) 12.5 MG tablet TAKE 1 TABLET (12.5 MG TOTAL) BY MOUTH DAILY. 02/28/17  Yes Lada, Satira Anis, MD  latanoprost (XALATAN) 0.005 % ophthalmic solution Place 1 drop into both eyes at bedtime.  05/04/10  Yes [provider]  letrozole (FEMARA) 2.5 MG tablet Take 1 tablet (2.5 mg total) by mouth daily. 12/24/16  Yes Nicholas Lose, MD  lidocaine-prilocaine (EMLA) cream Apply to affected area once Patient taking differently: Apply 1 application topically daily as needed (prior to port access). Apply to affected area once 07/05/16  Yes Nicholas Lose, MD  losartan (COZAAR) 100 MG tablet Take 1 tablet (100 mg total) by mouth daily. Patient taking differently: Take 100 mg by mouth at bedtime.  08/02/16  Yes Lada, Satira Anis, MD  ondansetron (ZOFRAN) 8 MG tablet Take 1 tablet (8 mg total) by mouth 2 (two) times daily as needed for refractory nausea / vomiting. Start on day 3 after chemo. 07/05/16  Yes Nicholas Lose, MD  oxyCODONE (OXY IR/ROXICODONE) 5 MG immediate release tablet Take 1-2 tablets (5-10 mg total) by mouth every 4 (four) hours as needed for moderate pain. 12/18/16  Yes Coralie Keens, MD  pantoprazole (PROTONIX) 40 MG tablet TAKE 1 TABLET BY MOUTH EVERY DAY 02/25/17  Yes Lada, Satira Anis, MD  potassium chloride (KLOR-CON M10) 10 MEQ tablet Take 1 tablet (10 mEq total) by mouth 3 (three) times daily. 07/06/16  Yes Lada, Satira Anis, MD  predniSONE (DELTASONE) 10 MG tablet Take 1 tablet (10 mg total) by mouth daily with breakfast. 6 tabs day 1, 5 day 2; 4 on day 3; 3 on day 4; 2 on day 5; 1 on day 6, 7 05/01/17  Yes  Nicholas Lose, MD  prochlorperazine (COMPAZINE) 10 MG tablet Take 1 tablet (10 mg total) by mouth every 6 (six) hours as needed for nausea or vomiting. 07/17/16  Yes Gudena,  Loleta Dicker, MD  promethazine (PHENERGAN) 12.5 MG tablet Take 12.5 mg by mouth every 6 (six) hours as needed for nausea or vomiting.   Yes [provider]  tizanidine (ZANAFLEX) 2 MG capsule Take 1-2 capsules (2-4 mg total) by mouth every 8 (eight) hours as needed for muscle spasms. 01/04/17  Yes Lada, Satira Anis, MD  palbociclib (IBRANCE) 125 MG capsule Take 1 capsule (125 mg total) by mouth daily with breakfast. Take whole with food. Patient not taking: Reported on 04/26/2017 01/21/17   Nicholas Lose, MD  triamcinolone cream (KENALOG) 0.1 % Apply 1 application topically 2 (two) times daily. If needed Patient not taking: Reported on 04/26/2017 11/13/16   Arnetha Courser, MD  warfarin (COUMADIN) 4 MG tablet Take 3 tablets (12 mg total) by mouth daily. Or as otherwise directed by your doctor 12/24/16   Arnetha Courser, MD    ALLERGY: Allergies  Allergen Reactions  . Lisinopril Cough  . Eggs Or Egg-Derived Products Rash    Raw eggs ONLY (boiled, scrambled, etc)  If baked into something it is NOT a issue    Social History  Substance Use Topics  . Smoking status: Current Some Day Smoker    Years: 40.00    Types: Cigarettes  . Smokeless tobacco: Never Used     Comment: 3 a week.  pt can't remember when she had one last  . Alcohol use No     Family History  Problem Relation Age of Onset  . Heart disease Mother   . Hypertension Mother   . Anemia Mother   . Heart disease Father   . Hypertension Father   . Anemia Father   . Cancer Unknown   . Heart disease Sister   . Heart disease Brother   . Mental illness Brother      ROS   ROS :   Unable to obtain due to lack of cooperation from patient  Exam   Vitals:   05/13/17 0939 05/13/17 1128  BP:    Pulse: 81 83  Resp: 15 13  Temp:     General appearance: NAD,  trach in place Eyes: PERRL, Fundoscopic: normal Musculoskeletal:  I am unable to perform a thorough musculoskeletal exam due to patient's cooperation.  She refused to move her legs for strength testing but was able to move them antigravity when pressured to move.  Neurological Awake, alert, oriented Cranial nerves grossly intact although limited due to patient cooperation  Results - Imaging/Labs   Results for orders placed or performed during the hospital encounter of 05/13/17 (from the past 48 hour(s))  Basic metabolic panel     Status: Abnormal   Collection Time: 05/13/17  2:08 AM  Result Value Ref Range   Sodium 134 (L) 135 - 145 mmol/L   Potassium 4.0 3.5 - 5.1 mmol/L   Chloride 104 101 - 111 mmol/L   CO2 20 (L) 22 - 32 mmol/L   Glucose, Bld 122 (H) 65 - 99 mg/dL   BUN 24 (H) 6 - 20 mg/dL   Creatinine, Ser 2.02 (H) 0.44 - 1.00 mg/dL   Calcium 10.0 8.9 - 10.3 mg/dL   GFR calc non Af Amer 25 (L) >60 mL/min   GFR calc Af Amer 29 (L) >60 mL/min    Comment: (NOTE) The eGFR has been calculated using the CKD EPI equation. This calculation has not been validated in all clinical situations. eGFR's persistently <60 mL/min signify possible Chronic Kidney Disease.    Anion gap 10  5 - 15  CBC     Status: Abnormal   Collection Time: 05/13/17  2:08 AM  Result Value Ref Range   WBC 11.9 (H) 4.0 - 10.5 K/uL   RBC 2.74 (L) 3.87 - 5.11 MIL/uL   Hemoglobin 8.3 (L) 12.0 - 15.0 g/dL   HCT 25.3 (L) 36.0 - 46.0 %   MCV 92.3 78.0 - 100.0 fL   MCH 30.3 26.0 - 34.0 pg   MCHC 32.8 30.0 - 36.0 g/dL   RDW 19.9 (H) 11.5 - 15.5 %   Platelets 6 (LL) 150 - 400 K/uL    Comment: REPEATED TO VERIFY SPECIMEN CHECKED FOR CLOTS PLATELET COUNT CONFIRMED BY SMEAR CRITICAL RESULT CALLED TO, READ BACK BY AND VERIFIED WITH: A.DENNIS,RN 0309 05/13/17 M.CAMPBELL   Protime-INR     Status: Abnormal   Collection Time: 05/13/17  2:11 AM  Result Value Ref Range   Prothrombin Time 16.0 (H) 11.4 - 15.2 seconds    INR 1.27   Type and screen     Status: None   Collection Time: 05/13/17  2:19 AM  Result Value Ref Range   ABO/RH(D) AB POS    Antibody Screen NEG    Sample Expiration 05/16/2017   ABO/Rh     Status: None   Collection Time: 05/13/17  2:19 AM  Result Value Ref Range   ABO/RH(D) AB POS   I-stat troponin, ED     Status: None   Collection Time: 05/13/17  2:26 AM  Result Value Ref Range   Troponin i, poc 0.00 0.00 - 0.08 ng/mL   Comment 3            Comment: Due to the release kinetics of cTnI, a negative result within the first hours of the onset of symptoms does not rule out myocardial infarction with certainty. If myocardial infarction is still suspected, repeat the test at appropriate intervals.   I-stat chem 8, ed     Status: Abnormal   Collection Time: 05/13/17  2:27 AM  Result Value Ref Range   Sodium 136 135 - 145 mmol/L   Potassium 4.0 3.5 - 5.1 mmol/L   Chloride 104 101 - 111 mmol/L   BUN 26 (H) 6 - 20 mg/dL   Creatinine, Ser 2.10 (H) 0.44 - 1.00 mg/dL   Glucose, Bld 120 (H) 65 - 99 mg/dL   Calcium, Ion 1.25 1.15 - 1.40 mmol/L   TCO2 23 0 - 100 mmol/L   Hemoglobin 7.5 (L) 12.0 - 15.0 g/dL   HCT 22.0 (L) 36.0 - 46.0 %  Prepare Pheresed Platelets     Status: None (Preliminary result)   Collection Time: 05/13/17  5:30 AM  Result Value Ref Range   Unit Number [id number]    Blood Component Type PLTP LR1 PAS    Unit division 00    Status of Unit ISSUED    Transfusion Status OK TO TRANSFUSE   CBC     Status: Abnormal   Collection Time: 05/13/17 10:41 AM  Result Value Ref Range   WBC 7.5 4.0 - 10.5 K/uL   RBC 2.56 (L) 3.87 - 5.11 MIL/uL   Hemoglobin 7.5 (L) 12.0 - 15.0 g/dL    Comment: CONSISTENT WITH PREVIOUS RESULT   HCT 23.3 (L) 36.0 - 46.0 %   MCV 91.0 78.0 - 100.0 fL   MCH 29.3 26.0 - 34.0 pg   MCHC 32.2 30.0 - 36.0 g/dL   RDW 19.5 (H) 11.5 - 15.5 %   Platelets 12 (  LL) 150 - 400 K/uL    Comment: REPEATED TO VERIFY CRITICAL VALUE NOTED.  VALUE IS  CONSISTENT WITH PREVIOUSLY REPORTED AND CALLED VALUE.   Troponin I (q 6hr x 3)     Status: Abnormal   Collection Time: 05/13/17 10:41 AM  Result Value Ref Range   Troponin I 0.03 (HH) <0.03 ng/mL    Comment: CRITICAL RESULT CALLED TO, READ BACK BY AND VERIFIED WITH: B.TILLMAN,RN 05/13/17 1137 BY BSLADE     Ct Abdomen Pelvis Wo Contrast  Result Date: 05/13/2017 CLINICAL DATA:  Initial evaluation for acute right-sided chest pain, extending into right and left sites of face with numbness and tingling in right leg. EXAM: CT CHEST, ABDOMEN AND PELVIS WITHOUT CONTRAST CT OF THE THORACIC AND LUMBAR SPINE WITHOUT CONTRAST. TECHNIQUE: Multidetector CT imaging of the chest, abdomen and pelvis as well as the thoracic and lumbar spine was performed following the standard protocol without IV contrast. COMPARISON:  Prior radiograph from earlier same day. FINDINGS: CT CHEST FINDINGS Cardiovascular: Intrathoracic aorta of normal caliber. Scattered atheromatous plaque within the aortic arch. Partially visualized great vessels within normal limits. Heart size within normal limits. No pericardial effusion. Left-sided pacemaker/ AICD in place. Right-sided Port-A-Cath in place as well. Mediastinum/Nodes: Thyroid normal. No pathologically enlarged mediastinal, hilar, or axillary lymph nodes identified. Sequelae of prior right axillary dissection noted. Esophagus within normal limits. Lungs/Pleura: Tracheostomy tube in place within the upper airway. Tracheobronchial tree patent. Hazy atelectatic changes present dependently within the lower lobes bilaterally. No focal infiltrates. No pulmonary edema or pleural effusion. No pneumothorax. No worrisome pulmonary nodule or mass. Musculoskeletal: Sequelae of prior mastectomy noted. Scattered lytic and sclerotic changes within the visualized spine, likely related osseous metastases. No acute osseous abnormality. Cervical ACDF noted. CT ABDOMEN PELVIS FINDINGS Hepatobiliary: Limited  noncontrast evaluation liver unremarkable. Gallbladder within normal limits. No biliary dilatation. Pancreas: Pancreas within normal limits. Spleen: Spleen within normal limits. Adrenals/Urinary Tract: 2.5 cm intermediate density right adrenal nodule, indeterminate. Adrenal glands otherwise unremarkable. Right kidneys somewhat atrophic as compared to the left. Punctate 2 mm nonobstructive stone present within the lower pole the right kidney. No other radiopaque calculi. No hydronephrosis or hydroureter. Bladder within normal limits. Stomach/Bowel: Stomach within normal limits. No evidence for bowel obstruction. Mild colonic diverticulosis without evidence for acute diverticulitis. No acute inflammatory changes seen about the bowels. No findings to suggest acute appendicitis. 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 No aneurysm. No adenopathy. Flattening of the IVC, suggesting dehydration/ hypovolemia. Reproductive: Uterus is absent. Left ovary within normal limits. Right ovary not discretely identified. Other: No free air or fluid. Diastasis of the rectus abdominis musculature with small fat containing paraumbilical hernia. Musculoskeletal: Diffuse sclerosis with lucent reasons throughout the visualized osseous structures, likely related to osseous metastases. No acute osseous abnormality. CT THORACIC SPINE: Mild dextroscoliosis. Vertebral bodies otherwise normally aligned with preservation of the normal thoracic kyphosis. No listhesis. Cervical ACDF noted. Diffuse abnormal appearance of the visualized osseous structures, likely related to diffuse osseous metastases. No associated pathologic fracture. No appreciable extraosseous tumor or significant epidural tumor. Paraspinous soft tissues within normal limits. Multilevel degenerative endplate spurring seen throughout the thoracic spine, greatest within the midthoracic spine. Scattered posterior endplate marginal osteophytic  spurring noted as well. No significant canal stenosis. Mild to moderate bilateral foraminal narrowing noted within the lower thoracic spine at T10-11, T11-12, and T12-L1. CT LUMBAR SPINE: Normal segmentation. Lowest well-formed disc labeled the L5-S1 level. 4 mm anterolisthesis of L3 on L4 and 2  mm anterolisthesis of L4 on L5. Vertebral bodies otherwise normally aligned with preservation of the normal lumbar lordosis. Widespread osseous metastatic disease seen throughout the visualized osseous structures. No pathologic fracture. No significant extra osseous extension of tumor identified. Paraspinous soft tissues demonstrate no acute abnormality. Visualized sacrum intact. L1-2: Mild disc bulge with facet hypertrophy. No canal stenosis. Mild bilateral L1 foraminal stenosis. L2-3: Diffuse disc bulge with moderate facet hypertrophy. Mild canal stenosis. Mild bilateral L2 foraminal narrowing. L3-4: 4 mm anterolisthesis. Diffuse disc bulge with disc desiccation. Advanced facet arthrosis. Moderate to severe canal and bilateral subarticular stenosis. Moderate to severe bilateral L3 foraminal narrowing, left worse than right. L4-5: 2 mm anterolisthesis of L4 on L5. Associated diffuse disc bulge. Advanced facet arthrosis. Patient status post prior decompression at this level. No residual canal stenosis. Foramina appear patent. L5-S1: L5-S1 interspace largely fused in ankylosed. Prior posterior decompression. Right greater than left facet arthrosis. No canal stenosis. Mild bilateral L5 foraminal narrowing. IMPRESSION: CT CHEST, ABDOMEN, AND PELVIS IMPRESSION: 1. No acute abnormality identified within the chest, abdomen, and pelvis. 2. Diffuse abnormal appearance of the visualized osseous structures, consistent with diffuse osseous metastases. 3. Chronic right renal atrophy with associated 2 mm nonobstructive nephrolithiasis. 4. Mild colonic diverticulosis without evidence for acute diverticulitis. 5. 2.5 cm right adrenal  nodule, indeterminate. Follow-up exam with dedicated adrenal mass protocol CT and/or MRI suggested for full characterization. 6. Sequelae of prior mastectomy with right axillary nodal dissection. 7. Tracheostomy tube in place in the upper airway. CT THORACIC AND LUMBAR SPINE IMPRESSION: 1. No acute abnormality within the thoracic and lumbar spine. 2. Diffuse osseous metastatic disease. No associated pathologic fracture. No appreciable extra osseous or epidural tumor identified on this noncontrast examination. 3. Multilevel degenerative spondylolysis as detailed above, greatest within the lower lumbar spine at the L3-4 level. Please see above report for a full description of these findings. Electronically Signed   By: Jeannine Boga M.D.   On: 05/13/2017 05:00   Ct Head Wo Contrast  Result Date: 05/13/2017 CLINICAL DATA:  Initial evaluation for acute numbness, tingling, paresthesias. EXAM: CT HEAD WITHOUT CONTRAST TECHNIQUE: Contiguous axial images were obtained from the base of the skull through the vertex without intravenous contrast. COMPARISON:  None available. FINDINGS: Brain: Cerebral volume within normal limits. There is an acute subdural hematoma seen at the anterior inferior falx measuring up to 3 mm in maximal thickness. Additional patchy small volume extra-axial hemorrhage overlies the anterior parasagittal left frontal convexity (series 3, image 23). No significant mass effect. No made of an additional subacute to chronic appearing subdural collection overlying the left parietal convexity measuring up to 5 mm in maximal thickness. No midline shift. No hydrocephalus or ventricular trapping. Basilar cisterns remain patent. No other acute intracranial hemorrhage. No acute large vessel territory infarct. No mass lesion. Vascular: No hyperdense vessel. Scattered vascular calcifications noted within the carotid siphons. Skull: Scalp soft tissues within normal limits.  Calvarium intact. Sinuses/Orbits:  Globes and orbital soft tissues within normal limits. Scattered mucosal thickening within the sphenoid sinuses. Paranasal sinuses are otherwise clear. No mastoid effusion. IMPRESSION: 1. Acute small volume subdural hemorrhage positioned along the anterior inferior falx and overlying the left anterior cerebral convexity. No significant mass effect. 2. Additional subacute to chronic left subdural collection overlying the left parietal convexity without significant mass effect. Critical Value/emergent results were called by telephone at the time of interpretation on 05/13/2017 at 5:09 am to Dr. Ripley Fraise , who verbally acknowledged these results. Electronically Signed  By: Jeannine Boga M.D.   On: 05/13/2017 05:14   Ct Chest Wo Contrast  Result Date: 05/13/2017 CLINICAL DATA:  Initial evaluation for acute right-sided chest pain, extending into right and left sites of face with numbness and tingling in right leg. EXAM: CT CHEST, ABDOMEN AND PELVIS WITHOUT CONTRAST CT OF THE THORACIC AND LUMBAR SPINE WITHOUT CONTRAST. TECHNIQUE: Multidetector CT imaging of the chest, abdomen and pelvis as well as the thoracic and lumbar spine was performed following the standard protocol without IV contrast. COMPARISON:  Prior radiograph from earlier same day. FINDINGS: CT CHEST FINDINGS Cardiovascular: Intrathoracic aorta of normal caliber. Scattered atheromatous plaque within the aortic arch. Partially visualized great vessels within normal limits. Heart size within normal limits. No pericardial effusion. Left-sided pacemaker/ AICD in place. Right-sided Port-A-Cath in place as well. Mediastinum/Nodes: Thyroid normal. No pathologically enlarged mediastinal, hilar, or axillary lymph nodes identified. Sequelae of prior right axillary dissection noted. Esophagus within normal limits. Lungs/Pleura: Tracheostomy tube in place within the upper airway. Tracheobronchial tree patent. Hazy atelectatic changes present dependently  within the lower lobes bilaterally. No focal infiltrates. No pulmonary edema or pleural effusion. No pneumothorax. No worrisome pulmonary nodule or mass. Musculoskeletal: Sequelae of prior mastectomy noted. Scattered lytic and sclerotic changes within the visualized spine, likely related osseous metastases. No acute osseous abnormality. Cervical ACDF noted. CT ABDOMEN PELVIS FINDINGS Hepatobiliary: Limited noncontrast evaluation liver unremarkable. Gallbladder within normal limits. No biliary dilatation. Pancreas: Pancreas within normal limits. Spleen: Spleen within normal limits. Adrenals/Urinary Tract: 2.5 cm intermediate density right adrenal nodule, indeterminate. Adrenal glands otherwise unremarkable. Right kidneys somewhat atrophic as compared to the left. Punctate 2 mm nonobstructive stone present within the lower pole the right kidney. No other radiopaque calculi. No hydronephrosis or hydroureter. Bladder within normal limits. Stomach/Bowel: Stomach within normal limits. No evidence for bowel obstruction. Mild colonic diverticulosis without evidence for acute diverticulitis. No acute inflammatory changes seen about the bowels. No findings to suggest acute appendicitis. 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 No aneurysm. No adenopathy. Flattening of the IVC, suggesting dehydration/ hypovolemia. Reproductive: Uterus is absent. Left ovary within normal limits. Right ovary not discretely identified. Other: No free air or fluid. Diastasis of the rectus abdominis musculature with small fat containing paraumbilical hernia. Musculoskeletal: Diffuse sclerosis with lucent reasons throughout the visualized osseous structures, likely related to osseous metastases. No acute osseous abnormality. CT THORACIC SPINE: Mild dextroscoliosis. Vertebral bodies otherwise normally aligned with preservation of the normal thoracic kyphosis. No listhesis. Cervical ACDF noted. Diffuse abnormal  appearance of the visualized osseous structures, likely related to diffuse osseous metastases. No associated pathologic fracture. No appreciable extraosseous tumor or significant epidural tumor. Paraspinous soft tissues within normal limits. Multilevel degenerative endplate spurring seen throughout the thoracic spine, greatest within the midthoracic spine. Scattered posterior endplate marginal osteophytic spurring noted as well. No significant canal stenosis. Mild to moderate bilateral foraminal narrowing noted within the lower thoracic spine at T10-11, T11-12, and T12-L1. CT LUMBAR SPINE: Normal segmentation. Lowest well-formed disc labeled the L5-S1 level. 4 mm anterolisthesis of L3 on L4 and 2 mm anterolisthesis of L4 on L5. Vertebral bodies otherwise normally aligned with preservation of the normal lumbar lordosis. Widespread osseous metastatic disease seen throughout the visualized osseous structures. No pathologic fracture. No significant extra osseous extension of tumor identified. Paraspinous soft tissues demonstrate no acute abnormality. Visualized sacrum intact. L1-2: Mild disc bulge with facet hypertrophy. No canal stenosis. Mild bilateral L1 foraminal stenosis. L2-3: Diffuse disc bulge with moderate facet hypertrophy.  Mild canal stenosis. Mild bilateral L2 foraminal narrowing. L3-4: 4 mm anterolisthesis. Diffuse disc bulge with disc desiccation. Advanced facet arthrosis. Moderate to severe canal and bilateral subarticular stenosis. Moderate to severe bilateral L3 foraminal narrowing, left worse than right. L4-5: 2 mm anterolisthesis of L4 on L5. Associated diffuse disc bulge. Advanced facet arthrosis. Patient status post prior decompression at this level. No residual canal stenosis. Foramina appear patent. L5-S1: L5-S1 interspace largely fused in ankylosed. Prior posterior decompression. Right greater than left facet arthrosis. No canal stenosis. Mild bilateral L5 foraminal narrowing. IMPRESSION: CT  CHEST, ABDOMEN, AND PELVIS IMPRESSION: 1. No acute abnormality identified within the chest, abdomen, and pelvis. 2. Diffuse abnormal appearance of the visualized osseous structures, consistent with diffuse osseous metastases. 3. Chronic right renal atrophy with associated 2 mm nonobstructive nephrolithiasis. 4. Mild colonic diverticulosis without evidence for acute diverticulitis. 5. 2.5 cm right adrenal nodule, indeterminate. Follow-up exam with dedicated adrenal mass protocol CT and/or MRI suggested for full characterization. 6. Sequelae of prior mastectomy with right axillary nodal dissection. 7. Tracheostomy tube in place in the upper airway. CT THORACIC AND LUMBAR SPINE IMPRESSION: 1. No acute abnormality within the thoracic and lumbar spine. 2. Diffuse osseous metastatic disease. No associated pathologic fracture. No appreciable extra osseous or epidural tumor identified on this noncontrast examination. 3. Multilevel degenerative spondylolysis as detailed above, greatest within the lower lumbar spine at the L3-4 level. Please see above report for a full description of these findings. Electronically Signed   By: Jeannine Boga M.D.   On: 05/13/2017 05:00   Ct T-spine No Charge  Result Date: 05/13/2017 CLINICAL DATA:  Initial evaluation for acute right-sided chest pain, extending into right and left sites of face with numbness and tingling in right leg. EXAM: CT CHEST, ABDOMEN AND PELVIS WITHOUT CONTRAST CT OF THE THORACIC AND LUMBAR SPINE WITHOUT CONTRAST. TECHNIQUE: Multidetector CT imaging of the chest, abdomen and pelvis as well as the thoracic and lumbar spine was performed following the standard protocol without IV contrast. COMPARISON:  Prior radiograph from earlier same day. FINDINGS: CT CHEST FINDINGS Cardiovascular: Intrathoracic aorta of normal caliber. Scattered atheromatous plaque within the aortic arch. Partially visualized great vessels within normal limits. Heart size within normal  limits. No pericardial effusion. Left-sided pacemaker/ AICD in place. Right-sided Port-A-Cath in place as well. Mediastinum/Nodes: Thyroid normal. No pathologically enlarged mediastinal, hilar, or axillary lymph nodes identified. Sequelae of prior right axillary dissection noted. Esophagus within normal limits. Lungs/Pleura: Tracheostomy tube in place within the upper airway. Tracheobronchial tree patent. Hazy atelectatic changes present dependently within the lower lobes bilaterally. No focal infiltrates. No pulmonary edema or pleural effusion. No pneumothorax. No worrisome pulmonary nodule or mass. Musculoskeletal: Sequelae of prior mastectomy noted. Scattered lytic and sclerotic changes within the visualized spine, likely related osseous metastases. No acute osseous abnormality. Cervical ACDF noted. CT ABDOMEN PELVIS FINDINGS Hepatobiliary: Limited noncontrast evaluation liver unremarkable. Gallbladder within normal limits. No biliary dilatation. Pancreas: Pancreas within normal limits. Spleen: Spleen within normal limits. Adrenals/Urinary Tract: 2.5 cm intermediate density right adrenal nodule, indeterminate. Adrenal glands otherwise unremarkable. Right kidneys somewhat atrophic as compared to the left. Punctate 2 mm nonobstructive stone present within the lower pole the right kidney. No other radiopaque calculi. No hydronephrosis or hydroureter. Bladder within normal limits. Stomach/Bowel: Stomach within normal limits. No evidence for bowel obstruction. Mild colonic diverticulosis without evidence for acute diverticulitis. No acute inflammatory changes seen about the bowels. No findings to suggest acute appendicitis. Vascular/Lymphatic: Mild to moderate aorto bi-iliac atherosclerotic  disease. No aneurysm. No adenopathy. Flattening of the IVC, suggesting dehydration/ hypovolemia. Reproductive: Uterus is absent. Left ovary within normal limits. Right ovary not discretely identified. Other: No free air or fluid.  Diastasis of the rectus abdominis musculature with small fat containing paraumbilical hernia. Musculoskeletal: Diffuse sclerosis with lucent reasons throughout the visualized osseous structures, likely related to osseous metastases. No acute osseous abnormality. CT THORACIC SPINE: Mild dextroscoliosis. Vertebral bodies otherwise normally aligned with preservation of the normal thoracic kyphosis. No listhesis. Cervical ACDF noted. Diffuse abnormal appearance of the visualized osseous structures, likely related to diffuse osseous metastases. No associated pathologic fracture. No appreciable extraosseous tumor or significant epidural tumor. Paraspinous soft tissues within normal limits. Multilevel degenerative endplate spurring seen throughout the thoracic spine, greatest within the midthoracic spine. Scattered posterior endplate marginal osteophytic spurring noted as well. No significant canal stenosis. Mild to moderate bilateral foraminal narrowing noted within the lower thoracic spine at T10-11, T11-12, and T12-L1. CT LUMBAR SPINE: Normal segmentation. Lowest well-formed disc labeled the L5-S1 level. 4 mm anterolisthesis of L3 on L4 and 2 mm anterolisthesis of L4 on L5. Vertebral bodies otherwise normally aligned with preservation of the normal lumbar lordosis. Widespread osseous metastatic disease seen throughout the visualized osseous structures. No pathologic fracture. No significant extra osseous extension of tumor identified. Paraspinous soft tissues demonstrate no acute abnormality. Visualized sacrum intact. L1-2: Mild disc bulge with facet hypertrophy. No canal stenosis. Mild bilateral L1 foraminal stenosis. L2-3: Diffuse disc bulge with moderate facet hypertrophy. Mild canal stenosis. Mild bilateral L2 foraminal narrowing. L3-4: 4 mm anterolisthesis. Diffuse disc bulge with disc desiccation. Advanced facet arthrosis. Moderate to severe canal and bilateral subarticular stenosis. Moderate to severe bilateral  L3 foraminal narrowing, left worse than right. L4-5: 2 mm anterolisthesis of L4 on L5. Associated diffuse disc bulge. Advanced facet arthrosis. Patient status post prior decompression at this level. No residual canal stenosis. Foramina appear patent. L5-S1: L5-S1 interspace largely fused in ankylosed. Prior posterior decompression. Right greater than left facet arthrosis. No canal stenosis. Mild bilateral L5 foraminal narrowing. IMPRESSION: CT CHEST, ABDOMEN, AND PELVIS IMPRESSION: 1. No acute abnormality identified within the chest, abdomen, and pelvis. 2. Diffuse abnormal appearance of the visualized osseous structures, consistent with diffuse osseous metastases. 3. Chronic right renal atrophy with associated 2 mm nonobstructive nephrolithiasis. 4. Mild colonic diverticulosis without evidence for acute diverticulitis. 5. 2.5 cm right adrenal nodule, indeterminate. Follow-up exam with dedicated adrenal mass protocol CT and/or MRI suggested for full characterization. 6. Sequelae of prior mastectomy with right axillary nodal dissection. 7. Tracheostomy tube in place in the upper airway. CT THORACIC AND LUMBAR SPINE IMPRESSION: 1. No acute abnormality within the thoracic and lumbar spine. 2. Diffuse osseous metastatic disease. No associated pathologic fracture. No appreciable extra osseous or epidural tumor identified on this noncontrast examination. 3. Multilevel degenerative spondylolysis as detailed above, greatest within the lower lumbar spine at the L3-4 level. Please see above report for a full description of these findings. Electronically Signed   By: Jeannine Boga M.D.   On: 05/13/2017 05:00   Ct L-spine No Charge  Result Date: 05/13/2017 CLINICAL DATA:  Initial evaluation for acute right-sided chest pain, extending into right and left sites of face with numbness and tingling in right leg. EXAM: CT CHEST, ABDOMEN AND PELVIS WITHOUT CONTRAST CT OF THE THORACIC AND LUMBAR SPINE WITHOUT CONTRAST.  TECHNIQUE: Multidetector CT imaging of the chest, abdomen and pelvis as well as the thoracic and lumbar spine was performed following the standard protocol without  IV contrast. COMPARISON:  Prior radiograph from earlier same day. FINDINGS: CT CHEST FINDINGS Cardiovascular: Intrathoracic aorta of normal caliber. Scattered atheromatous plaque within the aortic arch. Partially visualized great vessels within normal limits. Heart size within normal limits. No pericardial effusion. Left-sided pacemaker/ AICD in place. Right-sided Port-A-Cath in place as well. Mediastinum/Nodes: Thyroid normal. No pathologically enlarged mediastinal, hilar, or axillary lymph nodes identified. Sequelae of prior right axillary dissection noted. Esophagus within normal limits. Lungs/Pleura: Tracheostomy tube in place within the upper airway. Tracheobronchial tree patent. Hazy atelectatic changes present dependently within the lower lobes bilaterally. No focal infiltrates. No pulmonary edema or pleural effusion. No pneumothorax. No worrisome pulmonary nodule or mass. Musculoskeletal: Sequelae of prior mastectomy noted. Scattered lytic and sclerotic changes within the visualized spine, likely related osseous metastases. No acute osseous abnormality. Cervical ACDF noted. CT ABDOMEN PELVIS FINDINGS Hepatobiliary: Limited noncontrast evaluation liver unremarkable. Gallbladder within normal limits. No biliary dilatation. Pancreas: Pancreas within normal limits. Spleen: Spleen within normal limits. Adrenals/Urinary Tract: 2.5 cm intermediate density right adrenal nodule, indeterminate. Adrenal glands otherwise unremarkable. Right kidneys somewhat atrophic as compared to the left. Punctate 2 mm nonobstructive stone present within the lower pole the right kidney. No other radiopaque calculi. No hydronephrosis or hydroureter. Bladder within normal limits. Stomach/Bowel: Stomach within normal limits. No evidence for bowel obstruction. Mild colonic  diverticulosis without evidence for acute diverticulitis. No acute inflammatory changes seen about the bowels. No findings to suggest acute appendicitis. 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 No aneurysm. No adenopathy. Flattening of the IVC, suggesting dehydration/ hypovolemia. Reproductive: Uterus is absent. Left ovary within normal limits. Right ovary not discretely identified. Other: No free air or fluid. Diastasis of the rectus abdominis musculature with small fat containing paraumbilical hernia. Musculoskeletal: Diffuse sclerosis with lucent reasons throughout the visualized osseous structures, likely related to osseous metastases. No acute osseous abnormality. CT THORACIC SPINE: Mild dextroscoliosis. Vertebral bodies otherwise normally aligned with preservation of the normal thoracic kyphosis. No listhesis. Cervical ACDF noted. Diffuse abnormal appearance of the visualized osseous structures, likely related to diffuse osseous metastases. No associated pathologic fracture. No appreciable extraosseous tumor or significant epidural tumor. Paraspinous soft tissues within normal limits. Multilevel degenerative endplate spurring seen throughout the thoracic spine, greatest within the midthoracic spine. Scattered posterior endplate marginal osteophytic spurring noted as well. No significant canal stenosis. Mild to moderate bilateral foraminal narrowing noted within the lower thoracic spine at T10-11, T11-12, and T12-L1. CT LUMBAR SPINE: Normal segmentation. Lowest well-formed disc labeled the L5-S1 level. 4 mm anterolisthesis of L3 on L4 and 2 mm anterolisthesis of L4 on L5. Vertebral bodies otherwise normally aligned with preservation of the normal lumbar lordosis. Widespread osseous metastatic disease seen throughout the visualized osseous structures. No pathologic fracture. No significant extra osseous extension of tumor identified. Paraspinous soft tissues demonstrate no  acute abnormality. Visualized sacrum intact. L1-2: Mild disc bulge with facet hypertrophy. No canal stenosis. Mild bilateral L1 foraminal stenosis. L2-3: Diffuse disc bulge with moderate facet hypertrophy. Mild canal stenosis. Mild bilateral L2 foraminal narrowing. L3-4: 4 mm anterolisthesis. Diffuse disc bulge with disc desiccation. Advanced facet arthrosis. Moderate to severe canal and bilateral subarticular stenosis. Moderate to severe bilateral L3 foraminal narrowing, left worse than right. L4-5: 2 mm anterolisthesis of L4 on L5. Associated diffuse disc bulge. Advanced facet arthrosis. Patient status post prior decompression at this level. No residual canal stenosis. Foramina appear patent. L5-S1: L5-S1 interspace largely fused in ankylosed. Prior posterior decompression. Right greater than left facet arthrosis. No  canal stenosis. Mild bilateral L5 foraminal narrowing. IMPRESSION: CT CHEST, ABDOMEN, AND PELVIS IMPRESSION: 1. No acute abnormality identified within the chest, abdomen, and pelvis. 2. Diffuse abnormal appearance of the visualized osseous structures, consistent with diffuse osseous metastases. 3. Chronic right renal atrophy with associated 2 mm nonobstructive nephrolithiasis. 4. Mild colonic diverticulosis without evidence for acute diverticulitis. 5. 2.5 cm right adrenal nodule, indeterminate. Follow-up exam with dedicated adrenal mass protocol CT and/or MRI suggested for full characterization. 6. Sequelae of prior mastectomy with right axillary nodal dissection. 7. Tracheostomy tube in place in the upper airway. CT THORACIC AND LUMBAR SPINE IMPRESSION: 1. No acute abnormality within the thoracic and lumbar spine. 2. Diffuse osseous metastatic disease. No associated pathologic fracture. No appreciable extra osseous or epidural tumor identified on this noncontrast examination. 3. Multilevel degenerative spondylolysis as detailed above, greatest within the lower lumbar spine at the L3-4 level. Please  see above report for a full description of these findings. Electronically Signed   By: Jeannine Boga M.D.   On: 05/13/2017 05:00   Dg Chest Portable 1 View  Result Date: 05/13/2017 CLINICAL DATA:  Acute onset of generalized chest pain. Initial encounter. EXAM: PORTABLE CHEST 1 VIEW COMPARISON:  PET/CT performed 01/14/2017, and chest radiograph performed 07/18/2016 FINDINGS: The lungs are well-aerated. Mild bibasilar airspace opacities may reflect atelectasis or possibly mild pneumonia. There is no evidence of pleural effusion or pneumothorax. The cardiomediastinal silhouette is borderline normal in size. A pacemaker is noted overlying the left chest wall, with leads ending at the right atrium and over the right ventricle. No acute osseous abnormalities are seen. The patient's tracheostomy tube is seen ending 7 cm above the carina. Cervical spinal fusion hardware is noted. IMPRESSION: Mild bibasilar airspace opacities may reflect atelectasis or possibly mild pneumonia. Electronically Signed   By: Garald Balding M.D.   On: 05/13/2017 02:13    Impression/Plan   66 y.o. female  Who presented to the ER for lower extremity numbness and weakness, chest pain and headache.  Unfortunately, Sara. Mirabella is neither cooperative with the history nor exam complicating her evaluation. 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  These are non operative.  Even if they were to be operative, she is a poor surgical candidate due to her low platelet count as well as her other medical conditions.  As far as her CT scans of the L-spine and T-spine are concerned, although this is not the best imaging she is unable to have an MRI or CT myelogram at this time due to  Pacemaker and platelet count respectively,  Based on CT scans, there is no neurosurgical  intervention that would be indicated.  Again, even if there was something that potentially could be operated on, she remains a poor surgical candidate.  I have discussed case with Dr. Kathyrn Sheriff was also reviewed the imaging.  There is nothing really that Neurosurgery can offer her at this time.  She can proceed with a repeat head CT just to monitor the subdural.  Should the subdurals worsen & cause any neuro deficits, there may need to be a palliative care consult.  Otherwise, she should just complete the 7 day Keppra course for seizure prophylaxis.   Neurosurgery will sign off.  Call for any concerns.

## 2017-05-13 NOTE — ED Notes (Addendum)
Pt called out due to bed being wet. When entering the room this NT introduced herself and told asked the pt if she was able to roll. Pt sts "of course I can roll, that's just common sense." This NT then began changing the pt and when asking the pt to roll back to complete the bed change the pt sts "where do you expect me to roll too? You must be a damn aid because no nurse would half make the bed."  -- unable to complete task of changing pts bed due to pt incompliance. Abigail Butts, RN made aware.

## 2017-05-13 NOTE — ED Notes (Signed)
Chaplain at bedside. Pt requesting ice chips

## 2017-05-13 NOTE — ED Provider Notes (Signed)
[facility] DEPT Provider Note   CSN: [id number] Arrival date & time: 05/13/17  0117     History   Chief Complaint Chief Complaint  Patient presents with  . Chest Pain    HPI [name] is a 66 y.o. female.  The history is provided by the patient.  Neurologic Problem  This is a new problem. Episode onset: just prior to arrival. The problem occurs constantly. The problem has been gradually worsening. Associated symptoms include chest pain, abdominal pain, headaches and shortness of breath. Nothing aggravates the symptoms. Nothing relieves the symptoms.  Patient with h/o metastatic Breast CA, h/o trach in place presents with multiple issues:  1.  She reports waking up tonight with numbness in BOTH legs and "legs wouldn't work"  She reports this is new as she is ambulatory No incontinence reported She reports intermittent Back pain recently  2. She also reports increased chest tightness and shortness of breath tonight  3. She also reports HA and diffuse numbness to head/face  4. She also reports pacemaker was "kicking" tonight but no LOC and no shocks reported  Past Medical History:  Diagnosis Date  . 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 Anticoagulated on Coumadin   . Arthritis   . Atherosclerosis of coronary artery of native heart without angina pectoris   . Atrial fibrillation, chronic ([place])   . 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 Breast cancer ([place]) 06/21/2016   Right breast  . CHF (congestive heart failure) ([place])   . Chronic hypokalemia   . Chronic kidney disease    stage III  . Chronic pain due to trauma   . Difficult intubation    permanent tracheostomy;lungs filled with water when extubated  . Frequent headaches   . GERD (gastroesophageal reflux disease)   . History of stomach ulcers   . Hypertension   . Hypertension, goal below 140/90   . Myocardial infarction ([place])   . PONV (postoperative nausea and vomiting)   . Presence of permanent cardiac pacemaker    [place]  . Spinal headache   . 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 Trachea, ring   . Tracheostomy in place [facility])   . Urinary tract infection, recurrent   . Urine incontinence     Patient Active Problem List   Diagnosis Date Noted  . Malignant neoplasm of upper-outer quadrant of right breast in female, estrogen receptor positive ([place]) 01/21/2017  . Breast cancer ([place]) 12/13/2016  . Decreased breath sounds at left lung base 11/13/2016  . Anemia due to antineoplastic chemotherapy 09/25/2016  . Anemia 09/25/2016  . Port catheter in place 08/31/2016  . Breast cancer of upper-inner quadrant of right female breast ([place]) 07/05/2016  . Hypokalemia 05/30/2016  . Mass of breast, right 05/30/2016  . Proptosis 05/30/2016  . Chronic kidney disease (CKD), stage III (moderate) 08/11/2015  . Cramp in muscle 07/11/2015  . H/O injury, presenting hazards to health 07/11/2015  . Esophagitis, reflux 07/11/2015  . Chronic pain due to injury 07/11/2015  . Chronic hypokalemia 07/11/2015  . Atrial fibrillation, chronic ([place]) 07/11/2015  . CAD in native artery 07/11/2015  . Erythema nodosum, acute form 07/11/2015  . Pain in joint of left wrist 07/11/2015  . Hypertension goal BP (blood pressure) < 140/90 01/04/2014  . Glaucoma 01/04/2014  . 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 Inflammation of uveal tract 01/04/2014  . 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 Tracheostomy status ([place]) 09/23/2012  . Aneurysm, cerebral 08/13/2012  . Cerebral vascular accident ([place]) 08/13/2012  . 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 Encounter for monitoring coumadin therapy 06/11/2012    Past Surgical History:  Procedure Laterality Date  . ABDOMINAL HYSTERECTOMY    . BACK SURGERY    . BLADDER SURGERY     hitch  . CHOLECYSTECTOMY    . FRACTURE SURGERY     B/L feet and right  ankle  . GANGLION CYST EXCISION  2011  . MASTECTOMY MODIFIED RADICAL Bilateral 12/13/2016   Procedure: RIGHT MODIFIED RADICAL MASTECTOMY, LEFT SIMPLE MASTECTOMY;  Surgeon: Coralie Keens, MD;  Location: [facility];  Service: General;  Laterality: Bilateral;  . PACEMAKER INSERTION  10/29/2005  . PACEMAKER PLACEMENT  08/22/2012  . PORT A CATH INJECTION ([place] HX)  07/18/2016  . PORTACATH PLACEMENT Right 07/18/2016   Procedure: INSERTION PORT-A-CATH;  Surgeon: Coralie Keens, MD;  Location: [facility];  Service: General;  Laterality: Right;  . TRACHEAL SURGERY  2000  . UVULOPALATOPHARYNGOPLASTY      OB History    No data available       Home Medications    Prior to Admission medications   Medication Sig Start Date End Date Taking? Authorizing Provider  cholecalciferol (VITAMIN D) 1000 UNITS tablet Take 1,000 Units by mouth daily.    [provider]  gabapentin (NEURONTIN) 300 MG capsule TAKE 1 CAPSULE (300 MG TOTAL) BY MOUTH 3 (THREE) TIMES DAILY. 01/22/17   Lada, Satira Anis, MD  hydrochlorothiazide (HYDRODIURIL) 12.5 MG tablet TAKE 1 TABLET (12.5 MG TOTAL) BY MOUTH DAILY. 02/28/17   Lada, Satira Anis, MD  latanoprost (XALATAN) 0.005 % ophthalmic solution Place 1 drop into both eyes at bedtime.  05/04/10   [provider]  letrozole (FEMARA) 2.5 MG tablet Take 1 tablet (2.5 mg total) by mouth daily. 12/24/16   Nicholas Lose, MD  lidocaine-prilocaine (EMLA) cream Apply to affected area once Patient taking differently: Apply 1 application topically daily as needed (prior to port access). Apply to affected area once 07/05/16   Nicholas Lose, MD  losartan (COZAAR) 100 MG tablet Take 1 tablet (100 mg total) by mouth daily. Patient taking differently: Take 100 mg by mouth at bedtime.  08/02/16   Lada, Satira Anis, MD  ondansetron (ZOFRAN) 8 MG tablet Take 1 tablet (8 mg total) by mouth 2 (two) times daily as needed for refractory nausea / vomiting. Start on day 3 after chemo. 07/05/16   Nicholas Lose, MD  oxyCODONE (OXY IR/ROXICODONE) 5 MG immediate release tablet Take 1-2 tablets (5-10 mg total) by mouth every 4 (four) hours as needed for moderate pain. 12/18/16   Coralie Keens, MD  palbociclib [facility]) 125 MG capsule Take 1 capsule (125 mg total) by mouth daily with breakfast. Take whole with food. Patient not taking: Reported on 04/26/2017 01/21/17   Nicholas Lose, MD  pantoprazole (PROTONIX) 40 MG tablet TAKE 1 TABLET BY MOUTH EVERY DAY 02/25/17   Lada, Satira Anis, MD  potassium chloride (KLOR-CON M10) 10 MEQ tablet Take 1 tablet (10 mEq total) by mouth 3 (three) times daily. 07/06/16   Arnetha Courser, MD  predniSONE (DELTASONE) 10 MG tablet Take 1 tablet (10 mg total) by mouth daily with breakfast. 6 tabs day 1, 5 day 2; 4 on day 3; 3 on day 4; 2 on day 5; 1 on day 6, 7 05/01/17   Nicholas Lose, MD  prochlorperazine (COMPAZINE)  10 MG tablet Take 1 tablet (10 mg total) by mouth every 6 (six) hours as needed for nausea or vomiting. 07/17/16   Nicholas Lose, MD  promethazine (PHENERGAN) 12.5 MG tablet Take 12.5 mg by mouth every 6 (six) hours as needed for nausea or vomiting.    [provider]  tizanidine (ZANAFLEX) 2 MG capsule Take 1-2 capsules (2-4 mg total) by mouth every 8 (eight) hours as needed for muscle spasms. 01/04/17   Arnetha Courser, MD  triamcinolone cream (KENALOG) 0.1 % Apply 1 application topically 2 (two) times daily. If needed Patient not taking: Reported on 04/26/2017 11/13/16   Arnetha Courser, MD  warfarin (COUMADIN) 4 MG tablet Take 3 tablets (12 mg total) by mouth daily. Or as otherwise directed by your doctor 12/24/16   Arnetha Courser, MD    Family History Family History  Problem Relation Age of Onset  . Heart disease Mother   . Hypertension Mother   . Anemia Mother   . Heart disease Father   . Hypertension Father   . Anemia Father   . Cancer Unknown   . Heart disease Sister   . Heart disease Brother   . Mental illness Brother     Social  History Social History  Substance Use Topics  . Smoking status: Current Some Day Smoker    Years: 40.00    Types: Cigarettes  . Smokeless tobacco: Never Used     Comment: 3 a week.  pt can't remember when she had one last  . Alcohol use No     Allergies   Lisinopril and Eggs or egg-derived products   Review of Systems Review of Systems  Constitutional: Negative for fever.  Respiratory: Positive for shortness of breath.   Cardiovascular: Positive for chest pain.  Gastrointestinal: Positive for abdominal pain.  Musculoskeletal: Positive for back pain.  Neurological: Positive for weakness, numbness and headaches.  Psychiatric/Behavioral: The patient is nervous/anxious.   All other systems reviewed and are negative.    Physical Exam Updated Vital Signs BP 133/73 (BP Location: Left Arm)   Pulse 90   Temp 98.6 F (37 C) (Oral)   Resp (!) 22   Ht 1.626 m (5\' 4" )   Wt 104.3 kg (230 lb)   SpO2 94%   BMI 39.48 kg/m   Physical Exam CONSTITUTIONAL: Anxious, tearful HEAD: Normocephalic/atraumatic EYES: EOMI/PERRL ENMT: Mucous membranes moist NECK: supple no meningeal signs, trach in place, no bleeding noted, no discharge noted SPINE/BACK:diffuse lumbar tenderness, No bruising/crepitance/stepoffs noted to spine CV: S1/S2 noted, no murmurs/rubs/gallops noted LUNGS: wheezing bilaterally, mild tachypnea ABDOMEN: soft, nontender GU:no cva tenderness NEURO: Awake/alert,no facial droop. No arm drift. She has equal weakness with bilateral hip flex/extension She also has weakness with bilateral plantar/dorsiflexion and with great toe extension.  Patellar/achilles reflex appear hypo-reflexic.  She reports she can not feel her legs EXTREMITIES: pulses normal/equal, full ROM SKIN: warm, color normal PSYCH: anxious and tearful  ED Treatments / Results  Labs (all labs ordered are listed, but only abnormal results are displayed) Labs Reviewed  BASIC METABOLIC PANEL - Abnormal;  Notable for the following:       Result Value   Sodium 134 (*)    CO2 20 (*)    Glucose, Bld 122 (*)    BUN 24 (*)    Creatinine, Ser 2.02 (*)    GFR calc non Af Amer 25 (*)    GFR calc Af Amer 29 (*)    All other components  within normal limits  CBC - Abnormal; Notable for the following:    WBC 11.9 (*)    RBC 2.74 (*)    Hemoglobin 8.3 (*)    HCT 25.3 (*)    RDW 19.9 (*)    Platelets 6 (*)    All other components within normal limits  PROTIME-INR - Abnormal; Notable for the following:    Prothrombin Time 16.0 (*)    All other components within normal limits  I-STAT CHEM 8, ED - Abnormal; Notable for the following:    BUN 26 (*)    Creatinine, Ser 2.10 (*)    Glucose, Bld 120 (*)    Hemoglobin 7.5 (*)    HCT 22.0 (*)    All other components within normal limits  PATHOLOGIST SMEAR REVIEW  I-STAT TROPONIN, ED  TYPE AND SCREEN  ABO/RH  PREPARE PLATELET PHERESIS    EKG  EKG Interpretation  Date/Time:  Monday May 13 2017 01:25:43 EDT Ventricular Rate:  90 PR Interval:    QRS Duration: 89 QT Interval:  329 QTC Calculation: 403 R Axis:   21 Text Interpretation:  Sinus rhythm Nonspecific T abnrm, anterolateral leads Abnormal ekg No significant change since last tracing Confirmed by Ripley Fraise [phone]) on 05/13/2017 1:51:24 AM       Radiology Ct Abdomen Pelvis Wo Contrast  Result Date: 05/13/2017 CLINICAL DATA:  Initial evaluation for acute right-sided chest pain, extending into right and left sites of face with numbness and tingling in right leg. EXAM: CT CHEST, ABDOMEN AND PELVIS WITHOUT CONTRAST CT OF THE THORACIC AND LUMBAR SPINE WITHOUT CONTRAST. TECHNIQUE: Multidetector CT imaging of the chest, abdomen and pelvis as well as the thoracic and lumbar spine was performed following the standard protocol without IV contrast. COMPARISON:  Prior radiograph from earlier same day. FINDINGS: CT CHEST FINDINGS Cardiovascular: Intrathoracic aorta of normal caliber.  Scattered atheromatous plaque within the aortic arch. Partially visualized great vessels within normal limits. Heart size within normal limits. No pericardial effusion. Left-sided pacemaker/ AICD in place. Right-sided Port-A-Cath in place as well. Mediastinum/Nodes: Thyroid normal. No pathologically enlarged mediastinal, hilar, or axillary lymph nodes identified. Sequelae of prior right axillary dissection noted. Esophagus within normal limits. Lungs/Pleura: Tracheostomy tube in place within the upper airway. Tracheobronchial tree patent. Hazy atelectatic changes present dependently within the lower lobes bilaterally. No focal infiltrates. No pulmonary edema or pleural effusion. No pneumothorax. No worrisome pulmonary nodule or mass. Musculoskeletal: Sequelae of prior mastectomy noted. Scattered lytic and sclerotic changes within the visualized spine, likely related osseous metastases. No acute osseous abnormality. Cervical ACDF noted. CT ABDOMEN PELVIS FINDINGS Hepatobiliary: Limited noncontrast evaluation liver unremarkable. Gallbladder within normal limits. No biliary dilatation. Pancreas: Pancreas within normal limits. Spleen: Spleen within normal limits. Adrenals/Urinary Tract: 2.5 cm intermediate density right adrenal nodule, indeterminate. Adrenal glands otherwise unremarkable. Right kidneys somewhat atrophic as compared to the left. Punctate 2 mm nonobstructive stone present within the lower pole the right kidney. No other radiopaque calculi. No hydronephrosis or hydroureter. Bladder within normal limits. Stomach/Bowel: Stomach within normal limits. No evidence for bowel obstruction. Mild colonic diverticulosis without evidence for acute diverticulitis. No acute inflammatory changes seen about the bowels. No findings to suggest acute appendicitis. 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 No aneurysm. No adenopathy. Flattening of the IVC, suggesting dehydration/  hypovolemia. Reproductive: Uterus is absent. Left ovary within normal limits. Right ovary not discretely identified. Other: No free air or fluid. Diastasis of the rectus abdominis musculature with small fat containing  paraumbilical hernia. Musculoskeletal: Diffuse sclerosis with lucent reasons throughout the visualized osseous structures, likely related to osseous metastases. No acute osseous abnormality. CT THORACIC SPINE: Mild dextroscoliosis. Vertebral bodies otherwise normally aligned with preservation of the normal thoracic kyphosis. No listhesis. Cervical ACDF noted. Diffuse abnormal appearance of the visualized osseous structures, likely related to diffuse osseous metastases. No associated pathologic fracture. No appreciable extraosseous tumor or significant epidural tumor. Paraspinous soft tissues within normal limits. Multilevel degenerative endplate spurring seen throughout the thoracic spine, greatest within the midthoracic spine. Scattered posterior endplate marginal osteophytic spurring noted as well. No significant canal stenosis. Mild to moderate bilateral foraminal narrowing noted within the lower thoracic spine at T10-11, T11-12, and T12-L1. CT LUMBAR SPINE: Normal segmentation. Lowest well-formed disc labeled the L5-S1 level. 4 mm anterolisthesis of L3 on L4 and 2 mm anterolisthesis of L4 on L5. Vertebral bodies otherwise normally aligned with preservation of the normal lumbar lordosis. Widespread osseous metastatic disease seen throughout the visualized osseous structures. No pathologic fracture. No significant extra osseous extension of tumor identified. Paraspinous soft tissues demonstrate no acute abnormality. Visualized sacrum intact. L1-2: Mild disc bulge with facet hypertrophy. No canal stenosis. Mild bilateral L1 foraminal stenosis. L2-3: Diffuse disc bulge with moderate facet hypertrophy. Mild canal stenosis. Mild bilateral L2 foraminal narrowing. L3-4: 4 mm anterolisthesis. Diffuse disc  bulge with disc desiccation. Advanced facet arthrosis. Moderate to severe canal and bilateral subarticular stenosis. Moderate to severe bilateral L3 foraminal narrowing, left worse than right. L4-5: 2 mm anterolisthesis of L4 on L5. Associated diffuse disc bulge. Advanced facet arthrosis. Patient status post prior decompression at this level. No residual canal stenosis. Foramina appear patent. L5-S1: L5-S1 interspace largely fused in ankylosed. Prior posterior decompression. Right greater than left facet arthrosis. No canal stenosis. Mild bilateral L5 foraminal narrowing. IMPRESSION: CT CHEST, ABDOMEN, AND PELVIS IMPRESSION: 1. No acute abnormality identified within the chest, abdomen, and pelvis. 2. Diffuse abnormal appearance of the visualized osseous structures, consistent with diffuse osseous metastases. 3. Chronic right renal atrophy with associated 2 mm nonobstructive nephrolithiasis. 4. Mild colonic diverticulosis without evidence for acute diverticulitis. 5. 2.5 cm right adrenal nodule, indeterminate. Follow-up exam with dedicated adrenal mass protocol CT and/or MRI suggested for full characterization. 6. Sequelae of prior mastectomy with right axillary nodal dissection. 7. Tracheostomy tube in place in the upper airway. CT THORACIC AND LUMBAR SPINE IMPRESSION: 1. No acute abnormality within the thoracic and lumbar spine. 2. Diffuse osseous metastatic disease. No associated pathologic fracture. No appreciable extra osseous or epidural tumor identified on this noncontrast examination. 3. Multilevel degenerative spondylolysis as detailed above, greatest within the lower lumbar spine at the L3-4 level. Please see above report for a full description of these findings. Electronically Signed   By: Jeannine Boga M.D.   On: 05/13/2017 05:00   Ct Head Wo Contrast  Result Date: 05/13/2017 CLINICAL DATA:  Initial evaluation for acute numbness, tingling, paresthesias. EXAM: CT HEAD WITHOUT CONTRAST TECHNIQUE:  Contiguous axial images were obtained from the base of the skull through the vertex without intravenous contrast. COMPARISON:  None available. FINDINGS: Brain: Cerebral volume within normal limits. There is an acute subdural hematoma seen at the anterior inferior falx measuring up to 3 mm in maximal thickness. Additional patchy small volume extra-axial hemorrhage overlies the anterior parasagittal left frontal convexity (series 3, image 23). No significant mass effect. No made of an additional subacute to chronic appearing subdural collection overlying the left parietal convexity measuring up to 5 mm in maximal thickness. No  midline shift. No hydrocephalus or ventricular trapping. Basilar cisterns remain patent. No other acute intracranial hemorrhage. No acute large vessel territory infarct. No mass lesion. Vascular: No hyperdense vessel. Scattered vascular calcifications noted within the carotid siphons. Skull: Scalp soft tissues within normal limits.  Calvarium intact. Sinuses/Orbits: Globes and orbital soft tissues within normal limits. Scattered mucosal thickening within the sphenoid sinuses. Paranasal sinuses are otherwise clear. No mastoid effusion. IMPRESSION: 1. Acute small volume subdural hemorrhage positioned along the anterior inferior falx and overlying the left anterior cerebral convexity. No significant mass effect. 2. Additional subacute to chronic left subdural collection overlying the left parietal convexity without significant mass effect. Critical Value/emergent results were called by telephone at the time of interpretation on 05/13/2017 at 5:09 am to Dr. Ripley Fraise , who verbally acknowledged these results. Electronically Signed   By: Jeannine Boga M.D.   On: 05/13/2017 05:14   Ct Chest Wo Contrast  Result Date: 05/13/2017 CLINICAL DATA:  Initial evaluation for acute right-sided chest pain, extending into right and left sites of face with numbness and tingling in right leg. EXAM: CT  CHEST, ABDOMEN AND PELVIS WITHOUT CONTRAST CT OF THE THORACIC AND LUMBAR SPINE WITHOUT CONTRAST. TECHNIQUE: Multidetector CT imaging of the chest, abdomen and pelvis as well as the thoracic and lumbar spine was performed following the standard protocol without IV contrast. COMPARISON:  Prior radiograph from earlier same day. FINDINGS: CT CHEST FINDINGS Cardiovascular: Intrathoracic aorta of normal caliber. Scattered atheromatous plaque within the aortic arch. Partially visualized great vessels within normal limits. Heart size within normal limits. No pericardial effusion. Left-sided pacemaker/ AICD in place. Right-sided Port-A-Cath in place as well. Mediastinum/Nodes: Thyroid normal. No pathologically enlarged mediastinal, hilar, or axillary lymph nodes identified. Sequelae of prior right axillary dissection noted. Esophagus within normal limits. Lungs/Pleura: Tracheostomy tube in place within the upper airway. Tracheobronchial tree patent. Hazy atelectatic changes present dependently within the lower lobes bilaterally. No focal infiltrates. No pulmonary edema or pleural effusion. No pneumothorax. No worrisome pulmonary nodule or mass. Musculoskeletal: Sequelae of prior mastectomy noted. Scattered lytic and sclerotic changes within the visualized spine, likely related osseous metastases. No acute osseous abnormality. Cervical ACDF noted. CT ABDOMEN PELVIS FINDINGS Hepatobiliary: Limited noncontrast evaluation liver unremarkable. Gallbladder within normal limits. No biliary dilatation. Pancreas: Pancreas within normal limits. Spleen: Spleen within normal limits. Adrenals/Urinary Tract: 2.5 cm intermediate density right adrenal nodule, indeterminate. Adrenal glands otherwise unremarkable. Right kidneys somewhat atrophic as compared to the left. Punctate 2 mm nonobstructive stone present within the lower pole the right kidney. No other radiopaque calculi. No hydronephrosis or hydroureter. Bladder within normal limits.  Stomach/Bowel: Stomach within normal limits. No evidence for bowel obstruction. Mild colonic diverticulosis without evidence for acute diverticulitis. No acute inflammatory changes seen about the bowels. No findings to suggest acute appendicitis. 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 No aneurysm. No adenopathy. Flattening of the IVC, suggesting dehydration/ hypovolemia. Reproductive: Uterus is absent. Left ovary within normal limits. Right ovary not discretely identified. Other: No free air or fluid. Diastasis of the rectus abdominis musculature with small fat containing paraumbilical hernia. Musculoskeletal: Diffuse sclerosis with lucent reasons throughout the visualized osseous structures, likely related to osseous metastases. No acute osseous abnormality. CT THORACIC SPINE: Mild dextroscoliosis. Vertebral bodies otherwise normally aligned with preservation of the normal thoracic kyphosis. No listhesis. Cervical ACDF noted. Diffuse abnormal appearance of the visualized osseous structures, likely related to diffuse osseous metastases. No associated pathologic fracture. No appreciable extraosseous tumor or significant epidural tumor.  Paraspinous soft tissues within normal limits. Multilevel degenerative endplate spurring seen throughout the thoracic spine, greatest within the midthoracic spine. Scattered posterior endplate marginal osteophytic spurring noted as well. No significant canal stenosis. Mild to moderate bilateral foraminal narrowing noted within the lower thoracic spine at T10-11, T11-12, and T12-L1. CT LUMBAR SPINE: Normal segmentation. Lowest well-formed disc labeled the L5-S1 level. 4 mm anterolisthesis of L3 on L4 and 2 mm anterolisthesis of L4 on L5. Vertebral bodies otherwise normally aligned with preservation of the normal lumbar lordosis. Widespread osseous metastatic disease seen throughout the visualized osseous structures. No pathologic fracture. No  significant extra osseous extension of tumor identified. Paraspinous soft tissues demonstrate no acute abnormality. Visualized sacrum intact. L1-2: Mild disc bulge with facet hypertrophy. No canal stenosis. Mild bilateral L1 foraminal stenosis. L2-3: Diffuse disc bulge with moderate facet hypertrophy. Mild canal stenosis. Mild bilateral L2 foraminal narrowing. L3-4: 4 mm anterolisthesis. Diffuse disc bulge with disc desiccation. Advanced facet arthrosis. Moderate to severe canal and bilateral subarticular stenosis. Moderate to severe bilateral L3 foraminal narrowing, left worse than right. L4-5: 2 mm anterolisthesis of L4 on L5. Associated diffuse disc bulge. Advanced facet arthrosis. Patient status post prior decompression at this level. No residual canal stenosis. Foramina appear patent. L5-S1: L5-S1 interspace largely fused in ankylosed. Prior posterior decompression. Right greater than left facet arthrosis. No canal stenosis. Mild bilateral L5 foraminal narrowing. IMPRESSION: CT CHEST, ABDOMEN, AND PELVIS IMPRESSION: 1. No acute abnormality identified within the chest, abdomen, and pelvis. 2. Diffuse abnormal appearance of the visualized osseous structures, consistent with diffuse osseous metastases. 3. Chronic right renal atrophy with associated 2 mm nonobstructive nephrolithiasis. 4. Mild colonic diverticulosis without evidence for acute diverticulitis. 5. 2.5 cm right adrenal nodule, indeterminate. Follow-up exam with dedicated adrenal mass protocol CT and/or MRI suggested for full characterization. 6. Sequelae of prior mastectomy with right axillary nodal dissection. 7. Tracheostomy tube in place in the upper airway. CT THORACIC AND LUMBAR SPINE IMPRESSION: 1. No acute abnormality within the thoracic and lumbar spine. 2. Diffuse osseous metastatic disease. No associated pathologic fracture. No appreciable extra osseous or epidural tumor identified on this noncontrast examination. 3. Multilevel degenerative  spondylolysis as detailed above, greatest within the lower lumbar spine at the L3-4 level. Please see above report for a full description of these findings. Electronically Signed   By: Jeannine Boga M.D.   On: 05/13/2017 05:00   Ct T-spine No Charge  Result Date: 05/13/2017 CLINICAL DATA:  Initial evaluation for acute right-sided chest pain, extending into right and left sites of face with numbness and tingling in right leg. EXAM: CT CHEST, ABDOMEN AND PELVIS WITHOUT CONTRAST CT OF THE THORACIC AND LUMBAR SPINE WITHOUT CONTRAST. TECHNIQUE: Multidetector CT imaging of the chest, abdomen and pelvis as well as the thoracic and lumbar spine was performed following the standard protocol without IV contrast. COMPARISON:  Prior radiograph from earlier same day. FINDINGS: CT CHEST FINDINGS Cardiovascular: Intrathoracic aorta of normal caliber. Scattered atheromatous plaque within the aortic arch. Partially visualized great vessels within normal limits. Heart size within normal limits. No pericardial effusion. Left-sided pacemaker/ AICD in place. Right-sided Port-A-Cath in place as well. Mediastinum/Nodes: Thyroid normal. No pathologically enlarged mediastinal, hilar, or axillary lymph nodes identified. Sequelae of prior right axillary dissection noted. Esophagus within normal limits. Lungs/Pleura: Tracheostomy tube in place within the upper airway. Tracheobronchial tree patent. Hazy atelectatic changes present dependently within the lower lobes bilaterally. No focal infiltrates. No pulmonary edema or pleural effusion. No pneumothorax. No worrisome pulmonary  nodule or mass. Musculoskeletal: Sequelae of prior mastectomy noted. Scattered lytic and sclerotic changes within the visualized spine, likely related osseous metastases. No acute osseous abnormality. Cervical ACDF noted. CT ABDOMEN PELVIS FINDINGS Hepatobiliary: Limited noncontrast evaluation liver unremarkable. Gallbladder within normal limits. No biliary  dilatation. Pancreas: Pancreas within normal limits. Spleen: Spleen within normal limits. Adrenals/Urinary Tract: 2.5 cm intermediate density right adrenal nodule, indeterminate. Adrenal glands otherwise unremarkable. Right kidneys somewhat atrophic as compared to the left. Punctate 2 mm nonobstructive stone present within the lower pole the right kidney. No other radiopaque calculi. No hydronephrosis or hydroureter. Bladder within normal limits. Stomach/Bowel: Stomach within normal limits. No evidence for bowel obstruction. Mild colonic diverticulosis without evidence for acute diverticulitis. No acute inflammatory changes seen about the bowels. No findings to suggest acute appendicitis. 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 No aneurysm. No adenopathy. Flattening of the IVC, suggesting dehydration/ hypovolemia. Reproductive: Uterus is absent. Left ovary within normal limits. Right ovary not discretely identified. Other: No free air or fluid. Diastasis of the rectus abdominis musculature with small fat containing paraumbilical hernia. Musculoskeletal: Diffuse sclerosis with lucent reasons throughout the visualized osseous structures, likely related to osseous metastases. No acute osseous abnormality. CT THORACIC SPINE: Mild dextroscoliosis. Vertebral bodies otherwise normally aligned with preservation of the normal thoracic kyphosis. No listhesis. Cervical ACDF noted. Diffuse abnormal appearance of the visualized osseous structures, likely related to diffuse osseous metastases. No associated pathologic fracture. No appreciable extraosseous tumor or significant epidural tumor. Paraspinous soft tissues within normal limits. Multilevel degenerative endplate spurring seen throughout the thoracic spine, greatest within the midthoracic spine. Scattered posterior endplate marginal osteophytic spurring noted as well. No significant canal stenosis. Mild to moderate bilateral foraminal  narrowing noted within the lower thoracic spine at T10-11, T11-12, and T12-L1. CT LUMBAR SPINE: Normal segmentation. Lowest well-formed disc labeled the L5-S1 level. 4 mm anterolisthesis of L3 on L4 and 2 mm anterolisthesis of L4 on L5. Vertebral bodies otherwise normally aligned with preservation of the normal lumbar lordosis. Widespread osseous metastatic disease seen throughout the visualized osseous structures. No pathologic fracture. No significant extra osseous extension of tumor identified. Paraspinous soft tissues demonstrate no acute abnormality. Visualized sacrum intact. L1-2: Mild disc bulge with facet hypertrophy. No canal stenosis. Mild bilateral L1 foraminal stenosis. L2-3: Diffuse disc bulge with moderate facet hypertrophy. Mild canal stenosis. Mild bilateral L2 foraminal narrowing. L3-4: 4 mm anterolisthesis. Diffuse disc bulge with disc desiccation. Advanced facet arthrosis. Moderate to severe canal and bilateral subarticular stenosis. Moderate to severe bilateral L3 foraminal narrowing, left worse than right. L4-5: 2 mm anterolisthesis of L4 on L5. Associated diffuse disc bulge. Advanced facet arthrosis. Patient status post prior decompression at this level. No residual canal stenosis. Foramina appear patent. L5-S1: L5-S1 interspace largely fused in ankylosed. Prior posterior decompression. Right greater than left facet arthrosis. No canal stenosis. Mild bilateral L5 foraminal narrowing. IMPRESSION: CT CHEST, ABDOMEN, AND PELVIS IMPRESSION: 1. No acute abnormality identified within the chest, abdomen, and pelvis. 2. Diffuse abnormal appearance of the visualized osseous structures, consistent with diffuse osseous metastases. 3. Chronic right renal atrophy with associated 2 mm nonobstructive nephrolithiasis. 4. Mild colonic diverticulosis without evidence for acute diverticulitis. 5. 2.5 cm right adrenal nodule, indeterminate. Follow-up exam with dedicated adrenal mass protocol CT and/or MRI  suggested for full characterization. 6. Sequelae of prior mastectomy with right axillary nodal dissection. 7. Tracheostomy tube in place in the upper airway. CT THORACIC AND LUMBAR SPINE IMPRESSION: 1. No acute abnormality within the thoracic  and lumbar spine. 2. Diffuse osseous metastatic disease. No associated pathologic fracture. No appreciable extra osseous or epidural tumor identified on this noncontrast examination. 3. Multilevel degenerative spondylolysis as detailed above, greatest within the lower lumbar spine at the L3-4 level. Please see above report for a full description of these findings. Electronically Signed   By: Jeannine Boga M.D.   On: 05/13/2017 05:00   Ct L-spine No Charge  Result Date: 05/13/2017 CLINICAL DATA:  Initial evaluation for acute right-sided chest pain, extending into right and left sites of face with numbness and tingling in right leg. EXAM: CT CHEST, ABDOMEN AND PELVIS WITHOUT CONTRAST CT OF THE THORACIC AND LUMBAR SPINE WITHOUT CONTRAST. TECHNIQUE: Multidetector CT imaging of the chest, abdomen and pelvis as well as the thoracic and lumbar spine was performed following the standard protocol without IV contrast. COMPARISON:  Prior radiograph from earlier same day. FINDINGS: CT CHEST FINDINGS Cardiovascular: Intrathoracic aorta of normal caliber. Scattered atheromatous plaque within the aortic arch. Partially visualized great vessels within normal limits. Heart size within normal limits. No pericardial effusion. Left-sided pacemaker/ AICD in place. Right-sided Port-A-Cath in place as well. Mediastinum/Nodes: Thyroid normal. No pathologically enlarged mediastinal, hilar, or axillary lymph nodes identified. Sequelae of prior right axillary dissection noted. Esophagus within normal limits. Lungs/Pleura: Tracheostomy tube in place within the upper airway. Tracheobronchial tree patent. Hazy atelectatic changes present dependently within the lower lobes bilaterally. No focal  infiltrates. No pulmonary edema or pleural effusion. No pneumothorax. No worrisome pulmonary nodule or mass. Musculoskeletal: Sequelae of prior mastectomy noted. Scattered lytic and sclerotic changes within the visualized spine, likely related osseous metastases. No acute osseous abnormality. Cervical ACDF noted. CT ABDOMEN PELVIS FINDINGS Hepatobiliary: Limited noncontrast evaluation liver unremarkable. Gallbladder within normal limits. No biliary dilatation. Pancreas: Pancreas within normal limits. Spleen: Spleen within normal limits. Adrenals/Urinary Tract: 2.5 cm intermediate density right adrenal nodule, indeterminate. Adrenal glands otherwise unremarkable. Right kidneys somewhat atrophic as compared to the left. Punctate 2 mm nonobstructive stone present within the lower pole the right kidney. No other radiopaque calculi. No hydronephrosis or hydroureter. Bladder within normal limits. Stomach/Bowel: Stomach within normal limits. No evidence for bowel obstruction. Mild colonic diverticulosis without evidence for acute diverticulitis. No acute inflammatory changes seen about the bowels. No findings to suggest acute appendicitis. 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 No aneurysm. No adenopathy. Flattening of the IVC, suggesting dehydration/ hypovolemia. Reproductive: Uterus is absent. Left ovary within normal limits. Right ovary not discretely identified. Other: No free air or fluid. Diastasis of the rectus abdominis musculature with small fat containing paraumbilical hernia. Musculoskeletal: Diffuse sclerosis with lucent reasons throughout the visualized osseous structures, likely related to osseous metastases. No acute osseous abnormality. CT THORACIC SPINE: Mild dextroscoliosis. Vertebral bodies otherwise normally aligned with preservation of the normal thoracic kyphosis. No listhesis. Cervical ACDF noted. Diffuse abnormal appearance of the visualized osseous structures,  likely related to diffuse osseous metastases. No associated pathologic fracture. No appreciable extraosseous tumor or significant epidural tumor. Paraspinous soft tissues within normal limits. Multilevel degenerative endplate spurring seen throughout the thoracic spine, greatest within the midthoracic spine. Scattered posterior endplate marginal osteophytic spurring noted as well. No significant canal stenosis. Mild to moderate bilateral foraminal narrowing noted within the lower thoracic spine at T10-11, T11-12, and T12-L1. CT LUMBAR SPINE: Normal segmentation. Lowest well-formed disc labeled the L5-S1 level. 4 mm anterolisthesis of L3 on L4 and 2 mm anterolisthesis of L4 on L5. Vertebral bodies otherwise normally aligned with preservation of the normal  lumbar lordosis. Widespread osseous metastatic disease seen throughout the visualized osseous structures. No pathologic fracture. No significant extra osseous extension of tumor identified. Paraspinous soft tissues demonstrate no acute abnormality. Visualized sacrum intact. L1-2: Mild disc bulge with facet hypertrophy. No canal stenosis. Mild bilateral L1 foraminal stenosis. L2-3: Diffuse disc bulge with moderate facet hypertrophy. Mild canal stenosis. Mild bilateral L2 foraminal narrowing. L3-4: 4 mm anterolisthesis. Diffuse disc bulge with disc desiccation. Advanced facet arthrosis. Moderate to severe canal and bilateral subarticular stenosis. Moderate to severe bilateral L3 foraminal narrowing, left worse than right. L4-5: 2 mm anterolisthesis of L4 on L5. Associated diffuse disc bulge. Advanced facet arthrosis. Patient status post prior decompression at this level. No residual canal stenosis. Foramina appear patent. L5-S1: L5-S1 interspace largely fused in ankylosed. Prior posterior decompression. Right greater than left facet arthrosis. No canal stenosis. Mild bilateral L5 foraminal narrowing. IMPRESSION: CT CHEST, ABDOMEN, AND PELVIS IMPRESSION: 1. No acute  abnormality identified within the chest, abdomen, and pelvis. 2. Diffuse abnormal appearance of the visualized osseous structures, consistent with diffuse osseous metastases. 3. Chronic right renal atrophy with associated 2 mm nonobstructive nephrolithiasis. 4. Mild colonic diverticulosis without evidence for acute diverticulitis. 5. 2.5 cm right adrenal nodule, indeterminate. Follow-up exam with dedicated adrenal mass protocol CT and/or MRI suggested for full characterization. 6. Sequelae of prior mastectomy with right axillary nodal dissection. 7. Tracheostomy tube in place in the upper airway. CT THORACIC AND LUMBAR SPINE IMPRESSION: 1. No acute abnormality within the thoracic and lumbar spine. 2. Diffuse osseous metastatic disease. No associated pathologic fracture. No appreciable extra osseous or epidural tumor identified on this noncontrast examination. 3. Multilevel degenerative spondylolysis as detailed above, greatest within the lower lumbar spine at the L3-4 level. Please see above report for a full description of these findings. Electronically Signed   By: Jeannine Boga M.D.   On: 05/13/2017 05:00   Dg Chest Portable 1 View  Result Date: 05/13/2017 CLINICAL DATA:  Acute onset of generalized chest pain. Initial encounter. EXAM: PORTABLE CHEST 1 VIEW COMPARISON:  PET/CT performed 01/14/2017, and chest radiograph performed 07/18/2016 FINDINGS: The lungs are well-aerated. Mild bibasilar airspace opacities may reflect atelectasis or possibly mild pneumonia. There is no evidence of pleural effusion or pneumothorax. The cardiomediastinal silhouette is borderline normal in size. A pacemaker is noted overlying the left chest wall, with leads ending at the right atrium and over the right ventricle. No acute osseous abnormalities are seen. The patient's tracheostomy tube is seen ending 7 cm above the carina. Cervical spinal fusion hardware is noted. IMPRESSION: Mild bibasilar airspace opacities may reflect  atelectasis or possibly mild pneumonia. Electronically Signed   By: Garald Balding M.D.   On: 05/13/2017 02:13    Procedures Procedures   CRITICAL CARE Performed by: Sharyon Cable Total critical care time: 45 minutes Critical care time was exclusive of separately billable procedures and treating other patients. Critical care was necessary to treat or prevent imminent or life-threatening deterioration. Critical care was time spent personally by me on the following activities: development of treatment plan with patient and/or surrogate as well as nursing, discussions with consultants, evaluation of patient's response to treatment, examination of patient, obtaining history from patient or surrogate, ordering and performing treatments and interventions, ordering and review of laboratory studies, ordering and review of radiographic studies, pulse oximetry and re-evaluation of patient's condition. PATIENT WITH SUBDURAL HEMATOMA ALSO HAS LOW PLATELETS REQUIRING TRANSFUSION  Medications Ordered in ED Medications  0.9 %  sodium chloride infusion (not administered)  vancomycin (VANCOCIN) IVPB 1000 mg/200 mL premix (not administered)  lidocaine-prilocaine (EMLA) cream ( Topical Given 05/13/17 0144)  fentaNYL (SUBLIMAZE) injection 25 mcg (25 mcg Intravenous Given 05/13/17 0321)  ipratropium-albuterol (DUONEB) 0.5-2.5 (3) MG/3ML nebulizer solution 3 mL (3 mLs Nebulization Given 05/13/17 0259)  ceFEPIme (MAXIPIME) 2 g in dextrose 5 % 50 mL IVPB (0 g Intravenous Stopped 05/13/17 0420)  vancomycin (VANCOCIN) IVPB 1000 mg/200 mL premix (0 mg Intravenous Stopped 05/13/17 0445)  fentaNYL (SUBLIMAZE) injection 25 mcg (25 mcg Intravenous Given 05/13/17 0522)     Initial Impression / Assessment and Plan / ED Course  I have reviewed the triage vital signs and the nursing notes.  Pertinent labs & imaging results that were available during my care of the patient were reviewed by me and considered in my medical decision  making (see chart for details).     2:26 AM Very difficult history as pt is tearful/anxious and has multiple complaints Her main issue appears to be abrupt onset of LE weakness/numbness Per records, pt has metastatic breast CA and has lesion on thoracic/lumbar spine.  I am concerned for acute myelopathy Due to her pacemaker, I am unable to obtain emergent MRI tonight after speaking with MRI tech and radiologist Next best option is CT chest/abd/pelvis with IV contrast to evaluate Thoracic/lumbar spine She also has h/o aneurysm, on coumadin will get CT head Will follow closely 3:17 AM WILL BE UNABLE TO GET MRI DUE TO PACEMAKER WILL BE UNABLE TO PERFORM CT WITH CONTRAST DUE TO RENAL FUNCTION WILL PROCEED WITH CT IMAGING WITHOUT CONTRAST PT UPDATED ON PLAN PT AWAKE/ALERT, NO NEW COMPLAINTS   6:35 AM D/W RADIOLOGY CONCERNING ABNORMAL CT HEAD REVEALS SUBDURAL NO OBVIOUS ACUTE FINDINGS ON CT FOR THORACIC/LUMBAR SPINE PT RESTING COMFORTABLY, BUT WILL RESPOND TO VOICE APPROPRIATELY ADVISED OF CT FINDINGS I SPOKE TO ON CALL NEUROSURGERY (PA ON CALL) THEY RECOMMEND STEPDOWN ADMIT, NEURO CHECKS AND CORRECT PLATELETS ON MEDICINE SERVICE D/W DR [place] - PLAN TO CORRECT PLATELETS AND MONITOR I ALSO SPOKE TO PATIENT MPOA VIA PHONE ([place]) [facility] WILL BE COMING TO HOSPITAL SOON PT AGREEABLE TO RECEIVE PLATELET TRANSFUSION, SHE JUST HAD ONE EARLIER LAST WEEK  Final Clinical Impressions(s) / ED Diagnoses   Final diagnoses:  AKI (acute kidney injury) ([place])  Thrombocytopenia ([place])  HCAP (healthcare-associated pneumonia)  Acute subdural hematoma ([place])    New Prescriptions New Prescriptions   No medications on file     Ripley Fraise, MD 05/13/17 [phone]

## 2017-05-13 NOTE — ED Notes (Signed)
SDU continues to be unable to take report, RN to return call to ED

## 2017-05-13 NOTE — ED Triage Notes (Signed)
Pt transported from home by EMS with c/o R sided CP onset while sleeping @ 2300-0000, pt also c/o tingling to forehead and down R and L sides of face as well as numbness and tingling to R leg. Pt moving all extremities.  Pt is a CA patient, oral chemo, R side restricted, brain, bone, breast.  Pt is A & O, pt reports extreme fatigue last 3 days. Trach intact

## 2017-05-13 NOTE — Progress Notes (Signed)
Pharmacy Antibiotic Note  [name] is a 66 y.o. female admitted on 05/13/2017 with pneumonia.  Pharmacy has been consulted for Vancomycin dosing. WBC 11.9. Oral chemo on hold>>PLTS are 6.   Plan: Vancomycin 1000 mg IV q24h F/U additional gram negative coverage Trend WBC, temp, renal function  F/U infectious work-up Drug levels as indicated   Height: 5\' 4"  (162.6 cm) Weight: 230 lb (104.3 kg) IBW/kg (Calculated) : 54.7  Temp (24hrs), Avg:98.6 F (37 C), Min:98.6 F (37 C), Max:98.6 F (37 C)   Recent Labs Lab 05/07/17 1412 05/07/17 1413 05/13/17 0208 05/13/17 0227  WBC 8.3  --  11.9*  --   CREATININE  --  1.8* 2.02* 2.10*    Estimated Creatinine Clearance: 31.8 mL/min (A) (by C-G formula based on SCr of 2.1 mg/dL (H)).    Allergies  Allergen Reactions  . Lisinopril Cough  . Eggs Or Egg-Derived Products Rash    Raw eggs ONLY (boiled, scrambled, etc)  If baked into something it is NOT a issue    Narda Bonds 05/13/2017 5:38 AM

## 2017-05-13 NOTE — ED Notes (Signed)
Pt returned from [place], chaplain remains at bedside for patient support

## 2017-05-13 NOTE — ED Notes (Signed)
RT at bedside.

## 2017-05-13 NOTE — Progress Notes (Signed)
Dr. Lindi Adie wants crossmatched platelets for the patient. Spoke with blood bank and our blood bank doesn't have AB platelets however they can get A platelets to be infused in the morning from [place]. Blood bank wanted to clarify if A platelets would be okay with oncology. Attempted to contact Dr. Lindi Adie who is no longer on for the night so unable to clarify with him. Spoke with Dr. Earlie Server who is the oncologist on call and he stated, "A platelets should be fine." In regards to if he wanted to order any other blood products in the meantime Dr. Earlie Server stated, "Let Dr. Lindi Adie address this in the morning unless the patient is symptomatic."  Dr. Marily Memos has ordered blood products since patient's levels remain low and the platelets won't be available until tomorrow. Will continue to monitor and support patient.

## 2017-05-13 NOTE — Progress Notes (Signed)
RT called to suction pt.  Rt entered and pt asked Rt to clean inner cannula.  RT used saline and peroxide to remove secretions and pt replaced inner cannula.  Pt resting with RN and chaplain at bedside.  Rt will continue to monitor.

## 2017-05-13 NOTE — ED Notes (Signed)
No complications noted with platelet transfusion. Pt appears to be resting well a this time, RR even and unlabored. Will continue to monitor

## 2017-05-13 NOTE — ED Notes (Signed)
SDU unable to take report at this time

## 2017-05-13 NOTE — Progress Notes (Signed)
Prior to starting blood transfusion patient was having increased confusion including some paranoia where patient thought someone was outside of her door talking about her for 30 minutes. When this writer asked for more information patient stated, "Don't worry about it if you weren't one of the ones."  Patient is oriented to self, place, and reports situation but not date. Patient does seem drowsy and is reporting that her left leg below the knee loss of sensation. Pt stated, "I know my leg is there but I don't feel it." Will continue to monitor and support.

## 2017-05-13 NOTE — ED Notes (Signed)
Consent signed for blood products, pt states pain had decreased slightly.

## 2017-05-13 NOTE — Progress Notes (Signed)
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 Clinically patient complains of left leg numbness from mid thigh down to her feet. It appears that the patient woke up with inability to walk and called [facility]. She was brought to the emergency room and had a CT head which showed acute subdural hematoma. Neurosurgery was consulted and recommended supportive therapy and watchful monitoring. 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 Unfortunately her blood counts dropped dramatically with Palbociclib and the treatment has been on hold. The neutrophil count has recovered however the platelet count continued to remain low and in fact had had gotten much lower. Because of severe thrombocytopenia platelets of 19 she was given platelet transfusion. On arrival to the emergency room her platelet count was 6000 with a hemoglobin of 8.3 and a normal white blood cell count. In spite of platelet transfusion, her platelet count only improved to 12,000.    Breast cancer of upper-inner quadrant of right female breast ([place])   06/15/2016 Mammogram    Right breast irregular mass UIQ with diffuse skin thickening that is palpable, additional pleomorphic calcifications 2.8 x 2.1 x 3.5 cm extending posteriorly for another 2 cm; left breast 8 mm circumscribed nodule       06/21/2016 Initial Diagnosis    Right breast biopsy UIQ: IDC grade 2 with papillary features ER 100%, PR 100%, Ki-67 15%, HER-2 negative ratio 1.21; right axillary LN biopsy metastatic carcinoma in one lymph node with ECE, ER 45%, PR 0%, Ki-67 20%, HER-2 negative ratio 1.86      06/28/2016 Procedure    Left breast biopsy:  Fibroadenoma      07/16/2016 PET scan    Right breast primary inflammatory breast cancer, right axillary lymph node metastases, T7 bone metastases, indeterminate focus in the left medial breast 7 mm, right adrenal nodule      07/19/2016 - 09/20/2016 Neo-Adjuvant Chemotherapy    Neoadjuvant Taxotere and Cytoxan every 3 weeks 4 cycles      12/13/2016 Surgery    Right mastectomy: Multifocal tumor largest 2.3 cm, remaining 6 foci 0.3-1.2 centimeters, IDC with papillary features and smaller foci of IDC with lobular features grade 2, 19/20 lymph nodes positive, ER 100%, PR 100%, HER-2 negative, Ki-67 15%; repeat testing on the final path EF 30%, PR 0%, HER-2 negative ratio 1.67, Ki-67 20% RCB class 3 T2N3a Stage 3C      01/14/2017 PET scan    Multifocal hypermetabolic bone metastases within the T7 vertebra SUV 10.7. New uptake within L5 SUV 11. Previously known uptake at right inferior pubic ramus SUV 8.2.      01/21/2017 -  Anti-estrogen oral therapy    Palbociclib with Letrozole       Malignant neoplasm of upper-outer quadrant of right breast in female, estrogen receptor positive ([place])     OBJECTIVE: REVIEW OF SYSTEMS:   Constitutional: Denies fevers, chills or abnormal weight loss Eyes: Denies blurriness of vision Ears, nose, mouth, throat, and face: Trach Respiratory: Denies cough, dyspnea or wheezes Cardiovascular: Denies palpitation, chest discomfort Gastrointestinal:  Denies nausea, heartburn or change in bowel habits Skin: Denies abnormal skin rashes Lymphatics: Denies new lymphadenopathy or easy bruising Neurological:Denies numbness, tingling or new weaknesses Behavioral/Psych:  Extremely depressed and crying. Tells me that she has no more energy left to fight  Extremities: No lower extremity edema All other systems were reviewed with the patient and are negative.  I have reviewed the past medical history, past surgical history, social history and family history with the  patient and they are unchanged from previous note.   PHYSICAL EXAMINATION: ECOG PERFORMANCE STATUS: 4 - Bedbound  Vitals:   05/13/17 1300 05/13/17 1517  BP: (!) 151/72 130/75  Pulse: 85 84  Resp: 20 12  Temp: 98.4 F (36.9 C) 98.5 F (36.9 C)   Filed Weights   05/13/17 0130  Weight: 230 lb (104.3 kg)    GENERAL:alert, no distress and comfortable SKIN: skin color, texture, turgor are normal, no rashes or significant lesions EYES: normal, Conjunctiva are pink and non-injected, sclera clear OROPHARYNX:no exudate, no erythema and lips, buccal mucosa, and tongue normal  NECK: supple, thyroid normal size, non-tender, without nodularity LYMPH:  no palpable lymphadenopathy in the cervical, axillary or inguinal LUNGS: clear to auscultation and percussion with normal breathing effort HEART: regular rate & rhythm and no murmurs and no lower extremity edema ABDOMEN:abdomen soft, non-tender and normal bowel sounds Musculoskeletal:no cyanosis of digits and no clubbing  NEURO: alert & oriented x 3 with fluent speech, Left leg motor and sensory deficit  LABORATORY DATA:  I have reviewed the data as listed CMP Latest Ref Rng & Units 05/13/2017 05/13/2017 05/07/2017  Glucose 65 - 99 mg/dL 120(H) 122(H) 112  BUN 6 - 20 mg/dL 26(H) 24(H) 33.1(H)  Creatinine 0.44 - 1.00 mg/dL 2.10(H) 2.02(H) 1.8(H)  Sodium 135 - 145 mmol/L 136 134(L) 138  Potassium 3.5 - 5.1 mmol/L 4.0 4.0 4.0  Chloride 101 - 111 mmol/L 104 104 -  CO2 22 - 32 mmol/L - 20(L) 22  Calcium 8.9 - 10.3 mg/dL - 10.0 9.3  Total Protein 6.4 - 8.3 g/dL - - 7.2  Total Bilirubin 0.20 - 1.20 mg/dL - - 0.66  Alkaline Phos 40 - 150 U/L - - 216(H)  AST 5 - 34 U/L - - 101(H)  ALT 0 - 55 U/L - - 25    Lab Results  Component Value Date   WBC 7.5 05/13/2017   HGB 7.5 (L) 05/13/2017   HCT 23.3 (L) 05/13/2017   MCV 91.0 05/13/2017   PLT 12 (LL) 05/13/2017   NEUTROABS 4.1 05/07/2017    ASSESSMENT AND PLAN: 1. Severe thrombocytopenia:  Related to prior treatment with Palbociclib. However, we have not given Palbociclib for the past 2 months. Her white count has normalized. However the platelet count kept getting worse. Differential diagnosis: 1. Medication induced 2. autoimmune: A trial of week of steroids did not make any difference 3. 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 I do not believe it is associated with thrombocytopenia. However it is associated with vascular inflammation. Strokes also been reported with Behcets Further treatment of thrombocytopenia, I will order crossmatched platelets since regular platelet transfusions have not been successful.  2. 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 Patient is very depressed and tearful about her current situation. If her platelet count does not get better no further treatments are possible. She may need to undergo bone marrow biopsy if she has persistent thrombocytopenia.

## 2017-05-14 ENCOUNTER — Other Ambulatory Visit: Payer: PPO

## 2017-05-14 ENCOUNTER — Ambulatory Visit: Payer: PPO | Admitting: Hematology and Oncology

## 2017-05-14 DIAGNOSIS — R29898 Other symptoms and signs involving the musculoskeletal system: Secondary | ICD-10-CM

## 2017-05-14 DIAGNOSIS — I10 Essential (primary) hypertension: Secondary | ICD-10-CM

## 2017-05-14 DIAGNOSIS — G894 Chronic pain syndrome: Secondary | ICD-10-CM

## 2017-05-14 DIAGNOSIS — D631 Anemia in chronic kidney disease: Secondary | ICD-10-CM

## 2017-05-14 DIAGNOSIS — G620 Drug-induced polyneuropathy: Secondary | ICD-10-CM

## 2017-05-14 LAB — BASIC METABOLIC PANEL
ANION GAP: 7 (ref 5–15)
BUN: 24 mg/dL — AB (ref 6–20)
CALCIUM: 9.6 mg/dL (ref 8.9–10.3)
CO2: 23 mmol/L (ref 22–32)
CREATININE: 1.93 mg/dL — AB (ref 0.44–1.00)
Chloride: 104 mmol/L (ref 101–111)
GFR calc Af Amer: 30 mL/min — ABNORMAL LOW (ref >60)
GFR, EST NON AFRICAN AMERICAN: 26 mL/min — AB (ref >60)
GLUCOSE: 130 mg/dL — AB (ref 65–99)
Potassium: 4.2 mmol/L (ref 3.5–5.1)
Sodium: 134 mmol/L — ABNORMAL LOW (ref 135–145)

## 2017-05-14 LAB — CBC
HCT: 25.7 % — ABNORMAL LOW (ref 36.0–46.0)
HCT: 25.1 % — ABNORMAL LOW (ref 36.0–46.0)
HEMOGLOBIN: 8.1 g/dL — AB (ref 12.0–15.0)
Hemoglobin: 8.3 g/dL — ABNORMAL LOW (ref 12.0–15.0)
MCH: 29 pg (ref 26.0–34.0)
MCH: 29.3 pg (ref 26.0–34.0)
MCHC: 32.3 g/dL (ref 30.0–36.0)
MCHC: 32.3 g/dL (ref 30.0–36.0)
MCV: 89.9 fL (ref 78.0–100.0)
MCV: 90.9 fL (ref 78.0–100.0)
PLATELETS: 5 10*3/uL — AB (ref 150–400)
Platelets: 70 10*3/uL — ABNORMAL LOW (ref 150–400)
RBC: 2.86 MIL/uL — ABNORMAL LOW (ref 3.87–5.11)
RBC: 2.76 MIL/uL — ABNORMAL LOW (ref 3.87–5.11)
RDW: 19.8 % — AB (ref 11.5–15.5)
RDW: 20.2 % — ABNORMAL HIGH (ref 11.5–15.5)
WBC: 6.6 10*3/uL (ref 4.0–10.5)
WBC: 7.3 10*3/uL (ref 4.0–10.5)

## 2017-05-14 LAB — BPAM PLATELET PHERESIS
Blood Product Expiration Date: 201808062359
ISSUE DATE / TIME: 201808060611
Unit Type and Rh: 8400

## 2017-05-14 LAB — TYPE AND SCREEN
ABO/RH(D): AB POS
Antibody Screen: NEGATIVE
Unit division: 0

## 2017-05-14 LAB — BPAM RBC
BLOOD PRODUCT EXPIRATION DATE: 201808232359
ISSUE DATE / TIME: 201808061843
UNIT TYPE AND RH: 8400

## 2017-05-14 LAB — PREPARE PLATELET PHERESIS: Unit division: 0

## 2017-05-14 LAB — GLUCOSE, CAPILLARY: GLUCOSE-CAPILLARY: 113 mg/dL — AB (ref 65–99)

## 2017-05-14 MED ORDER — HYDRALAZINE HCL 20 MG/ML IJ SOLN
5.0000 mg | INTRAMUSCULAR | Status: DC
  Administered 2017-05-14 – 2017-05-18 (×22): 5 mg via INTRAVENOUS
  Filled 2017-05-14 (×21): qty 1

## 2017-05-14 MED ORDER — TIZANIDINE HCL 4 MG PO TABS
2.0000 mg | ORAL_TABLET | Freq: Three times a day (TID) | ORAL | Status: DC | PRN
  Administered 2017-05-15 – 2017-05-16 (×2): 2 mg via ORAL
  Filled 2017-05-14 (×3): qty 1

## 2017-05-14 MED ORDER — INSULIN ASPART 100 UNIT/ML ~~LOC~~ SOLN: 0.0000 [IU] | SUBCUTANEOUS | Status: DC

## 2017-05-14 NOTE — Progress Notes (Signed)
PROGRESS NOTE    [name]  VOJ:[id number] DOB: [birth year] DOA: 05/13/2017 PCP: Arnetha Courser, MD   Brief Narrative:  66 y.o. BF PMHx CVA, Chronic Respiratory Failure with Permanent Tracheostomy, HTN, Chronic Diastolic CHF?, A. fib S/P Permanent Pacemaker, MI, HTN, CKD stage III, Behcet's syndrome, Right Metastatic Breast cancer  Level V caveat applies to history obtained by patient due to patient being fatigued and wanting to sleep and not be bothered. Patient not very engaging and overall exam at time of admission and appears to be a poor historian with more generalities then specifics with regards to her current physical complaints.  Patient presenting with acute onset bilateral lower extremity numbness and weakness. Denies saddle anesthesia or loss of bowel or bladder function or lumbar back pain. Symptoms are constant but slowly improving. Patient states that she did not attempt to walk but simply hit her life alert button and came to the hospital for evaluation and workup. She's never had this problem before. Is not tried anything to alleviate her symptoms.  Patient states she's had headache off and on but appears to be unrelated to current symptoms as she has these chronically. At time of my exam pt denies any facial numbness which is different from what pt reported to EDP.   Patient also endorses a few intermittent episodes of chest pain. Short lived. These start to occur after realizing her legs were numb. Denies any associated palpitations, LE swelling, orthopnea, radiation of pain to her neck and jaw or left shoulder. No associated diaphoresis or nausea or shortness of breath.    Subjective: 8/7  A/O 4, states does not feel like herself, states unable to ambulate. Negative CP, negative SOB. Negative N/V, negative abdominal pain      Assessment & Plan:   Active Problems:   Artificial cardiac pacemaker   Tracheostomy status ([name])   Chronic pain due to injury  Atrial fibrillation, chronic ([name])   Chronic kidney disease (CKD), stage III (moderate)   Breast cancer of upper-inner quadrant of right female breast ([name])   Anemia due to antineoplastic chemotherapy   Acute subdural hematoma ([name])   Thrombocytopenia ([name])   Bilateral leg numbness  Acute Subdural Hematoma -Patient's neurologic exam does not match her position of SDH. -Neurosurgery following -Patient not candidate for MRI secondary to pacemaker -No history head trauma/CVA however severe thrombocytopenia. -Continue bedrest. Counseled patient on extreme risk of bleed if she was to fall. -Neuro checks q 4 hr  -8/6 transfused 1 unit platelets -8/6 transfused 1 unit PRBC -8/7 transfused 2 units crossmatched platelets  -Following platelet transfusion will obtain CBC -Keppra 500 mg BID for seizure prophylaxis -Hold Coumadin  Lower Extremity Numbness/Weakness -C-spine/T-spine CT nondiagnostic for cause -Per admission note neuro hospitalist also recommend C/T-spine myelogram when platelet count > 50 (metastatic disease?) -As stated in exam patient able to move lower extremities when she believed people are not watching her (psychiatric component?)   Thrombocytopenia Severe -Platelet count 5 -See SDH -Repeat CBC after 2 units crossmatched platelets today 70. -If platelets remain overnight stable obtain C/T-spine myelogram  Anemia (baseline HgB ~8.3) -Monitor closely  Recent Labs Lab 05/13/17 0208 05/13/17 0227 05/13/17 1041 05/14/17 0245 05/14/17 1430  HGB 8.3* 7.5* 7.5* 8.3* 8.1*  -At baseline  Tracheostomy permanent -Trach care per floor protocol   Chronic A. fib -Rate controlled -S/P pacemaker -Coumadin on hold secondary to SDH -Hydralazine 5 mg q 4 hr SBP goal 140-160 -Transfuse for hemoglobin<8  Essential HTN -See A.  Fib  Rt Breast Cancer metastatic -T4 N1 M1 stage IV diffuse bony metastases again noted on imaging in ED -S/P Rt mastectomy 12/13/16 -Last  chemotherapy 01/21/17 -Continue Femara -Treatment per oncology Dr. Nicholas Lose  Chronic pain syndrome -Given patient's claim of feeling confused will decrease the following: -DC morphine -Decrease Zanaflex to 2 mg TID PRN  Diabetes? -Hemoglobin A1c pending - Sensitive SSI  Peripheral neuropathy -Secondary to diabetes? -Neurontin 300 mg TID  CKD stage III(baseline Cr 1.3-2.0) -Monitor closely -Hold all nephrotoxic medication Lab Results  Component Value Date   CREATININE 1.93 (H) 05/14/2017   CREATININE 2.10 (H) 05/13/2017   CREATININE 2.02 (H) 05/13/2017  -at baseline         DVT prophylaxis: SCD Code Status: Full Family Communication: None Disposition Plan: TBD   Consultants:  Oncology Neurosurgery    Procedures/Significant Events:  8/6 PCXR:, Pneumonia vs atelectasis especially right middle lobe 8/6 CT head WO contrast;-acute SDH along anterior inferior falx and overlying Lt anterior cerebral convexity- -Subacute/chronic Lt subdural collection overlying left parietal convexity. 8/6 CT chest/abdomen and pelvis WO contrast:-Negative abnormality chest/abdomen/pelvis  -Chronic nonobstructive nephrolithiasis right kidney  -2.5 cm right adrenal nodule  -Tracheostomy in place 8/6 CT L-spine: Multilevel DJD, DDD. L3-L4 Anterolisthesis     VENTILATOR SETTINGS: None   Cultures   Antimicrobials: Anti-infectives    Start     Stop   05/14/17 0600  vancomycin (VANCOCIN) IVPB 1000 mg/200 mL premix  Status:  Discontinued     05/13/17 0943   05/13/17 0330  ceFEPIme (MAXIPIME) 2 g in dextrose 5 % 50 mL IVPB     05/13/17 0420   05/13/17 0330  vancomycin (VANCOCIN) IVPB 1000 mg/200 mL premix     05/13/17 0445       Devices    LINES / TUBES:      Continuous Infusions: . sodium chloride 50 mL/hr at 05/14/17 0500  . sodium chloride Stopped (05/14/17 0500)  . sodium chloride    . levETIRAcetam Stopped (05/13/17 2241)      Objective: Vitals:   05/14/17 0331 05/14/17 0538 05/14/17 0553 05/14/17 0607  BP:   118/62 129/77  Pulse: 82  79 91  Resp: 18  (!) 22 18  Temp:  98.3 F (36.8 C) 98.3 F (36.8 C) 98 F (36.7 C)  TempSrc:  Oral Oral Oral  SpO2: 96%  94% 91%  Weight:      Height:        Intake/Output Summary (Last 24 hours) at 05/14/17 6967 Last data filed at 05/14/17 0500  Gross per 24 hour  Intake          1903.33 ml  Output              800 ml  Net          1103.33 ml   Filed Weights   05/13/17 0130  Weight: 230 lb (104.3 kg)    Examination:  General: A/O 4, No acute respiratory distress Neck:  Negative scars, masses, torticollis, lymphadenopathy, JVD, chronic tracheostomy place negative sign of infection Lungs: Clear to auscultation bilaterally without wheezes or crackles Cardiovascular: Regular rate and rhythm without murmur gallop or rub normal S1 and S2. Patient has chemotherapy port upper right chest wall negative sign of infection Abdomen: Morbidly obese, negative abdominal pain, nondistended, positive soft, bowel sounds, no rebound, no ascites, no appreciable mass Extremities: No significant cyanosis, clubbing, or edema bilateral lower extremities Skin: Negative rashes, lesions, ulcers Psychiatric:  Negative depression, negative  anxiety, negative fatigue, negative mania  Central nervous system:  Cranial nerves II through XII intact, tongue/uvula midline, all upper extremities muscle strength 5/5, from knees up sensation intact throughout, finger nose finger bilateral within normal limits, quick finger touch bilateral within normal limits,  negative dysarthria, negative expressive aphasia, negative receptive aphasia. NOTE: Although patient states unable to move her eye lateral lower extremities nor feel soft touch over sharp pinpricks, when she thinks people are not looking patient has been able to flex and extend at knees and hips bilateral.   .     Data Reviewed: Care  during the described time interval was provided by me .  I have reviewed this patient's available data, including medical history, events of note, physical examination, and all test results as part of my evaluation. I have personally reviewed and interpreted all radiology studies.  CBC:  Recent Labs Lab 05/07/17 1412 05/13/17 0208 05/13/17 0227 05/13/17 1041 05/14/17 0245  WBC 8.3 11.9*  --  7.5 6.6  NEUTROABS 4.1  --   --   --   --   HGB 8.4* 8.3* 7.5* 7.5* 8.3*  HCT 25.6* 25.3* 22.0* 23.3* 25.7*  MCV 92.1 92.3  --  91.0 89.9  PLT 19* 6*  --  12* 5*   Basic Metabolic Panel:  Recent Labs Lab 05/07/17 1413 05/13/17 0208 05/13/17 0227 05/14/17 0245  NA 138 134* 136 134*  K 4.0 4.0 4.0 4.2  CL  --  104 104 104  CO2 22 20*  --  23  GLUCOSE 112 122* 120* 130*  BUN 33.1* 24* 26* 24*  CREATININE 1.8* 2.02* 2.10* 1.93*  CALCIUM 9.3 10.0  --  9.6   GFR: Estimated Creatinine Clearance: 34.6 mL/min (A) (by C-G formula based on SCr of 1.93 mg/dL (H)). Liver Function Tests:  Recent Labs Lab 05/07/17 1413  AST 101*  ALT 25  ALKPHOS 216*  BILITOT 0.66  PROT 7.2  ALBUMIN 3.4*   No results for input(s): LIPASE, AMYLASE in the last 168 hours. No results for input(s): AMMONIA in the last 168 hours. Coagulation Profile:  Recent Labs Lab 05/07/17 1402 05/13/17 0211  INR 2.60 1.27  PROTIME 31.2*  --    Cardiac Enzymes:  Recent Labs Lab 05/13/17 1041 05/13/17 1643  TROPONINI 0.03* <0.03   BNP (last 3 results) No results for input(s): PROBNP in the last 8760 hours. HbA1C: No results for input(s): HGBA1C in the last 72 hours. CBG: No results for input(s): GLUCAP in the last 168 hours. Lipid Profile: No results for input(s): CHOL, HDL, LDLCALC, TRIG, CHOLHDL, LDLDIRECT in the last 72 hours. Thyroid Function Tests: No results for input(s): TSH, T4TOTAL, FREET4, T3FREE, THYROIDAB in the last 72 hours. Anemia Panel: No results for input(s): VITAMINB12, FOLATE,  FERRITIN, TIBC, IRON, RETICCTPCT in the last 72 hours. Urine analysis:    Component Value Date/Time   COLORURINE YELLOW 05/13/2017 1230   APPEARANCEUR HAZY (A) 05/13/2017 1230   LABSPEC 1.014 05/13/2017 1230   PHURINE 6.0 05/13/2017 1230   GLUCOSEU NEGATIVE 05/13/2017 1230   GLUCOSEU NEGATIVE 11/30/2013 1455   HGBUR LARGE (A) 05/13/2017 1230   BILIRUBINUR NEGATIVE 05/13/2017 1230   BILIRUBINUR neg 11/25/2013 1528   KETONESUR NEGATIVE 05/13/2017 1230   PROTEINUR 100 (A) 05/13/2017 1230   UROBILINOGEN 0.2 11/30/2013 1455   NITRITE NEGATIVE 05/13/2017 1230   LEUKOCYTESUR LARGE (A) 05/13/2017 1230   Sepsis Labs: @LABRCNTIP (procalcitonin:4,lacticidven:4)  ) Recent Results (from the past 240 hour(s))  TECHNOLOGIST REVIEW  Status: None   Collection Time: 05/07/17  2:12 PM  Result Value Ref Range Status   Technologist Review   Final    1% Blast, 7% metamyelocytes, 3% myelocytes, few shistocytes, helmet cells and teardrop RBCs, moderate polychromasia.  MRSA PCR Screening     Status: None   Collection Time: 05/13/17  9:25 AM  Result Value Ref Range Status   MRSA by PCR NEGATIVE NEGATIVE Final    Comment:        The GeneXpert MRSA Assay (FDA approved for NASAL specimens only), is one component of a comprehensive MRSA colonization surveillance program. It is not intended to diagnose MRSA infection nor to guide or monitor treatment for MRSA infections.          Radiology Studies: Ct Abdomen Pelvis Wo Contrast  Result Date: 05/13/2017 CLINICAL DATA:  Initial evaluation for acute right-sided chest pain, extending into right and left sites of face with numbness and tingling in right leg. EXAM: CT CHEST, ABDOMEN AND PELVIS WITHOUT CONTRAST CT OF THE THORACIC AND LUMBAR SPINE WITHOUT CONTRAST. TECHNIQUE: Multidetector CT imaging of the chest, abdomen and pelvis as well as the thoracic and lumbar spine was performed following the standard protocol without IV contrast. COMPARISON:   Prior radiograph from earlier same day. FINDINGS: CT CHEST FINDINGS Cardiovascular: Intrathoracic aorta of normal caliber. Scattered atheromatous plaque within the aortic arch. Partially visualized great vessels within normal limits. Heart size within normal limits. No pericardial effusion. Left-sided pacemaker/ AICD in place. Right-sided Port-A-Cath in place as well. Mediastinum/Nodes: Thyroid normal. No pathologically enlarged mediastinal, hilar, or axillary lymph nodes identified. Sequelae of prior right axillary dissection noted. Esophagus within normal limits. Lungs/Pleura: Tracheostomy tube in place within the upper airway. Tracheobronchial tree patent. Hazy atelectatic changes present dependently within the lower lobes bilaterally. No focal infiltrates. No pulmonary edema or pleural effusion. No pneumothorax. No worrisome pulmonary nodule or mass. Musculoskeletal: Sequelae of prior mastectomy noted. Scattered lytic and sclerotic changes within the visualized spine, likely related osseous metastases. No acute osseous abnormality. Cervical ACDF noted. CT ABDOMEN PELVIS FINDINGS Hepatobiliary: Limited noncontrast evaluation liver unremarkable. Gallbladder within normal limits. No biliary dilatation. Pancreas: Pancreas within normal limits. Spleen: Spleen within normal limits. Adrenals/Urinary Tract: 2.5 cm intermediate density right adrenal nodule, indeterminate. Adrenal glands otherwise unremarkable. Right kidneys somewhat atrophic as compared to the left. Punctate 2 mm nonobstructive stone present within the lower pole the right kidney. No other radiopaque calculi. No hydronephrosis or hydroureter. Bladder within normal limits. Stomach/Bowel: Stomach within normal limits. No evidence for bowel obstruction. Mild colonic diverticulosis without evidence for acute diverticulitis. No acute inflammatory changes seen about the bowels. No findings to suggest acute appendicitis. Vascular/Lymphatic: Mild to moderate  aorto bi-iliac atherosclerotic disease. No aneurysm. No adenopathy. Flattening of the IVC, suggesting dehydration/ hypovolemia. Reproductive: Uterus is absent. Left ovary within normal limits. Right ovary not discretely identified. Other: No free air or fluid. Diastasis of the rectus abdominis musculature with small fat containing paraumbilical hernia. Musculoskeletal: Diffuse sclerosis with lucent reasons throughout the visualized osseous structures, likely related to osseous metastases. No acute osseous abnormality. CT THORACIC SPINE: Mild dextroscoliosis. Vertebral bodies otherwise normally aligned with preservation of the normal thoracic kyphosis. No listhesis. Cervical ACDF noted. Diffuse abnormal appearance of the visualized osseous structures, likely related to diffuse osseous metastases. No associated pathologic fracture. No appreciable extraosseous tumor or significant epidural tumor. Paraspinous soft tissues within normal limits. Multilevel degenerative endplate spurring seen throughout the thoracic spine, greatest within the midthoracic spine. Scattered posterior  endplate marginal osteophytic spurring noted as well. No significant canal stenosis. Mild to moderate bilateral foraminal narrowing noted within the lower thoracic spine at T10-11, T11-12, and T12-L1. CT LUMBAR SPINE: Normal segmentation. Lowest well-formed disc labeled the L5-S1 level. 4 mm anterolisthesis of L3 on L4 and 2 mm anterolisthesis of L4 on L5. Vertebral bodies otherwise normally aligned with preservation of the normal lumbar lordosis. Widespread osseous metastatic disease seen throughout the visualized osseous structures. No pathologic fracture. No significant extra osseous extension of tumor identified. Paraspinous soft tissues demonstrate no acute abnormality. Visualized sacrum intact. L1-2: Mild disc bulge with facet hypertrophy. No canal stenosis. Mild bilateral L1 foraminal stenosis. L2-3: Diffuse disc bulge with moderate facet  hypertrophy. Mild canal stenosis. Mild bilateral L2 foraminal narrowing. L3-4: 4 mm anterolisthesis. Diffuse disc bulge with disc desiccation. Advanced facet arthrosis. Moderate to severe canal and bilateral subarticular stenosis. Moderate to severe bilateral L3 foraminal narrowing, left worse than right. L4-5: 2 mm anterolisthesis of L4 on L5. Associated diffuse disc bulge. Advanced facet arthrosis. Patient status post prior decompression at this level. No residual canal stenosis. Foramina appear patent. L5-S1: L5-S1 interspace largely fused in ankylosed. Prior posterior decompression. Right greater than left facet arthrosis. No canal stenosis. Mild bilateral L5 foraminal narrowing. IMPRESSION: CT CHEST, ABDOMEN, AND PELVIS IMPRESSION: 1. No acute abnormality identified within the chest, abdomen, and pelvis. 2. Diffuse abnormal appearance of the visualized osseous structures, consistent with diffuse osseous metastases. 3. Chronic right renal atrophy with associated 2 mm nonobstructive nephrolithiasis. 4. Mild colonic diverticulosis without evidence for acute diverticulitis. 5. 2.5 cm right adrenal nodule, indeterminate. Follow-up exam with dedicated adrenal mass protocol CT and/or MRI suggested for full characterization. 6. Sequelae of prior mastectomy with right axillary nodal dissection. 7. Tracheostomy tube in place in the upper airway. CT THORACIC AND LUMBAR SPINE IMPRESSION: 1. No acute abnormality within the thoracic and lumbar spine. 2. Diffuse osseous metastatic disease. No associated pathologic fracture. No appreciable extra osseous or epidural tumor identified on this noncontrast examination. 3. Multilevel degenerative spondylolysis as detailed above, greatest within the lower lumbar spine at the L3-4 level. Please see above report for a full description of these findings. Electronically Signed   By: Jeannine Boga M.D.   On: 05/13/2017 05:00   Ct Head Wo Contrast  Result Date:  05/13/2017 CLINICAL DATA:  66 y/o  F; subdural hematoma. EXAM: CT HEAD WITHOUT CONTRAST TECHNIQUE: Contiguous axial images were obtained from the base of the skull through the vertex without intravenous contrast. COMPARISON:  05/13/2017 CT head FINDINGS: Brain: Stable thin subdural hematoma along the anterior falx. Stable tiny acute subdural hematoma over the left parasagittal frontal convexity. Stable thin chronic subdural hematoma over the left parietal convexity. No significant mass effect on the brain. No evidence for new acute intracranial hemorrhage, acute infarct, mass effect, herniation, or hydrocephalus. Cavum septum pellucidum et vergae. Vascular: No hyperdense vessel. Mild calcific atherosclerosis of carotid siphons. Skull: Normal. Negative for fracture or focal lesion. Sinuses/Orbits: No acute finding. Other: Right parietal scalp 14 mm dermal lesion, direct visualization is recommended. Torus palatini. IMPRESSION: 1. Stable thin acute subdural hematoma along the anterior falx and over superior left paramedian frontal lobe. 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 No significant mass effect. 2. No new acute intracranial abnormality. 3. Right high parietal scalp 14 mm dermal lesion. Direct visualization recommended. Electronically Signed   By: Kristine Garbe M.D.   On: 05/13/2017 22:18   Ct Head Wo Contrast  Result Date: 05/13/2017  CLINICAL DATA:  Initial evaluation for acute numbness, tingling, paresthesias. EXAM: CT HEAD WITHOUT CONTRAST TECHNIQUE: Contiguous axial images were obtained from the base of the skull through the vertex without intravenous contrast. COMPARISON:  None available. FINDINGS: Brain: Cerebral volume within normal limits. There is an acute subdural hematoma seen at the anterior inferior falx measuring up to 3 mm in maximal thickness. Additional patchy small volume extra-axial hemorrhage overlies the anterior parasagittal left frontal convexity  (series 3, image 23). No significant mass effect. No made of an additional subacute to chronic appearing subdural collection overlying the left parietal convexity measuring up to 5 mm in maximal thickness. No midline shift. No hydrocephalus or ventricular trapping. Basilar cisterns remain patent. No other acute intracranial hemorrhage. No acute large vessel territory infarct. No mass lesion. Vascular: No hyperdense vessel. Scattered vascular calcifications noted within the carotid siphons. Skull: Scalp soft tissues within normal limits.  Calvarium intact. Sinuses/Orbits: Globes and orbital soft tissues within normal limits. Scattered mucosal thickening within the sphenoid sinuses. Paranasal sinuses are otherwise clear. No mastoid effusion. IMPRESSION: 1. Acute small volume subdural hemorrhage positioned along the anterior inferior falx and overlying the left anterior cerebral convexity. No significant mass effect. 2. Additional subacute to chronic left subdural collection overlying the left parietal convexity without significant mass effect. Critical Value/emergent results were called by telephone at the time of interpretation on 05/13/2017 at 5:09 am to Dr. Ripley Fraise , who verbally acknowledged these results. Electronically Signed   By: Jeannine Boga M.D.   On: 05/13/2017 05:14   Ct Chest Wo Contrast  Result Date: 05/13/2017 CLINICAL DATA:  Initial evaluation for acute right-sided chest pain, extending into right and left sites of face with numbness and tingling in right leg. EXAM: CT CHEST, ABDOMEN AND PELVIS WITHOUT CONTRAST CT OF THE THORACIC AND LUMBAR SPINE WITHOUT CONTRAST. TECHNIQUE: Multidetector CT imaging of the chest, abdomen and pelvis as well as the thoracic and lumbar spine was performed following the standard protocol without IV contrast. COMPARISON:  Prior radiograph from earlier same day. FINDINGS: CT CHEST FINDINGS Cardiovascular: Intrathoracic aorta of normal caliber. Scattered  atheromatous plaque within the aortic arch. Partially visualized great vessels within normal limits. Heart size within normal limits. No pericardial effusion. Left-sided pacemaker/ AICD in place. Right-sided Port-A-Cath in place as well. Mediastinum/Nodes: Thyroid normal. No pathologically enlarged mediastinal, hilar, or axillary lymph nodes identified. Sequelae of prior right axillary dissection noted. Esophagus within normal limits. Lungs/Pleura: Tracheostomy tube in place within the upper airway. Tracheobronchial tree patent. Hazy atelectatic changes present dependently within the lower lobes bilaterally. No focal infiltrates. No pulmonary edema or pleural effusion. No pneumothorax. No worrisome pulmonary nodule or mass. Musculoskeletal: Sequelae of prior mastectomy noted. Scattered lytic and sclerotic changes within the visualized spine, likely related osseous metastases. No acute osseous abnormality. Cervical ACDF noted. CT ABDOMEN PELVIS FINDINGS Hepatobiliary: Limited noncontrast evaluation liver unremarkable. Gallbladder within normal limits. No biliary dilatation. Pancreas: Pancreas within normal limits. Spleen: Spleen within normal limits. Adrenals/Urinary Tract: 2.5 cm intermediate density right adrenal nodule, indeterminate. Adrenal glands otherwise unremarkable. Right kidneys somewhat atrophic as compared to the left. Punctate 2 mm nonobstructive stone present within the lower pole the right kidney. No other radiopaque calculi. No hydronephrosis or hydroureter. Bladder within normal limits. Stomach/Bowel: Stomach within normal limits. No evidence for bowel obstruction. Mild colonic diverticulosis without evidence for acute diverticulitis. No acute inflammatory changes seen about the bowels. No findings to suggest acute appendicitis. 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 No  aneurysm. No adenopathy. Flattening of the IVC, suggesting dehydration/ hypovolemia.  Reproductive: Uterus is absent. Left ovary within normal limits. Right ovary not discretely identified. Other: No free air or fluid. Diastasis of the rectus abdominis musculature with small fat containing paraumbilical hernia. Musculoskeletal: Diffuse sclerosis with lucent reasons throughout the visualized osseous structures, likely related to osseous metastases. No acute osseous abnormality. CT THORACIC SPINE: Mild dextroscoliosis. Vertebral bodies otherwise normally aligned with preservation of the normal thoracic kyphosis. No listhesis. Cervical ACDF noted. Diffuse abnormal appearance of the visualized osseous structures, likely related to diffuse osseous metastases. No associated pathologic fracture. No appreciable extraosseous tumor or significant epidural tumor. Paraspinous soft tissues within normal limits. Multilevel degenerative endplate spurring seen throughout the thoracic spine, greatest within the midthoracic spine. Scattered posterior endplate marginal osteophytic spurring noted as well. No significant canal stenosis. Mild to moderate bilateral foraminal narrowing noted within the lower thoracic spine at T10-11, T11-12, and T12-L1. CT LUMBAR SPINE: Normal segmentation. Lowest well-formed disc labeled the L5-S1 level. 4 mm anterolisthesis of L3 on L4 and 2 mm anterolisthesis of L4 on L5. Vertebral bodies otherwise normally aligned with preservation of the normal lumbar lordosis. Widespread osseous metastatic disease seen throughout the visualized osseous structures. No pathologic fracture. No significant extra osseous extension of tumor identified. Paraspinous soft tissues demonstrate no acute abnormality. Visualized sacrum intact. L1-2: Mild disc bulge with facet hypertrophy. No canal stenosis. Mild bilateral L1 foraminal stenosis. L2-3: Diffuse disc bulge with moderate facet hypertrophy. Mild canal stenosis. Mild bilateral L2 foraminal narrowing. L3-4: 4 mm anterolisthesis. Diffuse disc bulge with  disc desiccation. Advanced facet arthrosis. Moderate to severe canal and bilateral subarticular stenosis. Moderate to severe bilateral L3 foraminal narrowing, left worse than right. L4-5: 2 mm anterolisthesis of L4 on L5. Associated diffuse disc bulge. Advanced facet arthrosis. Patient status post prior decompression at this level. No residual canal stenosis. Foramina appear patent. L5-S1: L5-S1 interspace largely fused in ankylosed. Prior posterior decompression. Right greater than left facet arthrosis. No canal stenosis. Mild bilateral L5 foraminal narrowing. IMPRESSION: CT CHEST, ABDOMEN, AND PELVIS IMPRESSION: 1. No acute abnormality identified within the chest, abdomen, and pelvis. 2. Diffuse abnormal appearance of the visualized osseous structures, consistent with diffuse osseous metastases. 3. Chronic right renal atrophy with associated 2 mm nonobstructive nephrolithiasis. 4. Mild colonic diverticulosis without evidence for acute diverticulitis. 5. 2.5 cm right adrenal nodule, indeterminate. Follow-up exam with dedicated adrenal mass protocol CT and/or MRI suggested for full characterization. 6. Sequelae of prior mastectomy with right axillary nodal dissection. 7. Tracheostomy tube in place in the upper airway. CT THORACIC AND LUMBAR SPINE IMPRESSION: 1. No acute abnormality within the thoracic and lumbar spine. 2. Diffuse osseous metastatic disease. No associated pathologic fracture. No appreciable extra osseous or epidural tumor identified on this noncontrast examination. 3. Multilevel degenerative spondylolysis as detailed above, greatest within the lower lumbar spine at the L3-4 level. Please see above report for a full description of these findings. Electronically Signed   By: Jeannine Boga M.D.   On: 05/13/2017 05:00   Ct T-spine No Charge  Result Date: 05/13/2017 CLINICAL DATA:  Initial evaluation for acute right-sided chest pain, extending into right and left sites of face with numbness and  tingling in right leg. EXAM: CT CHEST, ABDOMEN AND PELVIS WITHOUT CONTRAST CT OF THE THORACIC AND LUMBAR SPINE WITHOUT CONTRAST. TECHNIQUE: Multidetector CT imaging of the chest, abdomen and pelvis as well as the thoracic and lumbar spine was performed following the standard protocol without IV contrast.  COMPARISON:  Prior radiograph from earlier same day. FINDINGS: CT CHEST FINDINGS Cardiovascular: Intrathoracic aorta of normal caliber. Scattered atheromatous plaque within the aortic arch. Partially visualized great vessels within normal limits. Heart size within normal limits. No pericardial effusion. Left-sided pacemaker/ AICD in place. Right-sided Port-A-Cath in place as well. Mediastinum/Nodes: Thyroid normal. No pathologically enlarged mediastinal, hilar, or axillary lymph nodes identified. Sequelae of prior right axillary dissection noted. Esophagus within normal limits. Lungs/Pleura: Tracheostomy tube in place within the upper airway. Tracheobronchial tree patent. Hazy atelectatic changes present dependently within the lower lobes bilaterally. No focal infiltrates. No pulmonary edema or pleural effusion. No pneumothorax. No worrisome pulmonary nodule or mass. Musculoskeletal: Sequelae of prior mastectomy noted. Scattered lytic and sclerotic changes within the visualized spine, likely related osseous metastases. No acute osseous abnormality. Cervical ACDF noted. CT ABDOMEN PELVIS FINDINGS Hepatobiliary: Limited noncontrast evaluation liver unremarkable. Gallbladder within normal limits. No biliary dilatation. Pancreas: Pancreas within normal limits. Spleen: Spleen within normal limits. Adrenals/Urinary Tract: 2.5 cm intermediate density right adrenal nodule, indeterminate. Adrenal glands otherwise unremarkable. Right kidneys somewhat atrophic as compared to the left. Punctate 2 mm nonobstructive stone present within the lower pole the right kidney. No other radiopaque calculi. No hydronephrosis or  hydroureter. Bladder within normal limits. Stomach/Bowel: Stomach within normal limits. No evidence for bowel obstruction. Mild colonic diverticulosis without evidence for acute diverticulitis. No acute inflammatory changes seen about the bowels. No findings to suggest acute appendicitis. 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 No aneurysm. No adenopathy. Flattening of the IVC, suggesting dehydration/ hypovolemia. Reproductive: Uterus is absent. Left ovary within normal limits. Right ovary not discretely identified. Other: No free air or fluid. Diastasis of the rectus abdominis musculature with small fat containing paraumbilical hernia. Musculoskeletal: Diffuse sclerosis with lucent reasons throughout the visualized osseous structures, likely related to osseous metastases. No acute osseous abnormality. CT THORACIC SPINE: Mild dextroscoliosis. Vertebral bodies otherwise normally aligned with preservation of the normal thoracic kyphosis. No listhesis. Cervical ACDF noted. Diffuse abnormal appearance of the visualized osseous structures, likely related to diffuse osseous metastases. No associated pathologic fracture. No appreciable extraosseous tumor or significant epidural tumor. Paraspinous soft tissues within normal limits. Multilevel degenerative endplate spurring seen throughout the thoracic spine, greatest within the midthoracic spine. Scattered posterior endplate marginal osteophytic spurring noted as well. No significant canal stenosis. Mild to moderate bilateral foraminal narrowing noted within the lower thoracic spine at T10-11, T11-12, and T12-L1. CT LUMBAR SPINE: Normal segmentation. Lowest well-formed disc labeled the L5-S1 level. 4 mm anterolisthesis of L3 on L4 and 2 mm anterolisthesis of L4 on L5. Vertebral bodies otherwise normally aligned with preservation of the normal lumbar lordosis. Widespread osseous metastatic disease seen throughout the visualized osseous  structures. No pathologic fracture. No significant extra osseous extension of tumor identified. Paraspinous soft tissues demonstrate no acute abnormality. Visualized sacrum intact. L1-2: Mild disc bulge with facet hypertrophy. No canal stenosis. Mild bilateral L1 foraminal stenosis. L2-3: Diffuse disc bulge with moderate facet hypertrophy. Mild canal stenosis. Mild bilateral L2 foraminal narrowing. L3-4: 4 mm anterolisthesis. Diffuse disc bulge with disc desiccation. Advanced facet arthrosis. Moderate to severe canal and bilateral subarticular stenosis. Moderate to severe bilateral L3 foraminal narrowing, left worse than right. L4-5: 2 mm anterolisthesis of L4 on L5. Associated diffuse disc bulge. Advanced facet arthrosis. Patient status post prior decompression at this level. No residual canal stenosis. Foramina appear patent. L5-S1: L5-S1 interspace largely fused in ankylosed. Prior posterior decompression. Right greater than left facet arthrosis. No canal stenosis.  Mild bilateral L5 foraminal narrowing. IMPRESSION: CT CHEST, ABDOMEN, AND PELVIS IMPRESSION: 1. No acute abnormality identified within the chest, abdomen, and pelvis. 2. Diffuse abnormal appearance of the visualized osseous structures, consistent with diffuse osseous metastases. 3. Chronic right renal atrophy with associated 2 mm nonobstructive nephrolithiasis. 4. Mild colonic diverticulosis without evidence for acute diverticulitis. 5. 2.5 cm right adrenal nodule, indeterminate. Follow-up exam with dedicated adrenal mass protocol CT and/or MRI suggested for full characterization. 6. Sequelae of prior mastectomy with right axillary nodal dissection. 7. Tracheostomy tube in place in the upper airway. CT THORACIC AND LUMBAR SPINE IMPRESSION: 1. No acute abnormality within the thoracic and lumbar spine. 2. Diffuse osseous metastatic disease. No associated pathologic fracture. No appreciable extra osseous or epidural tumor identified on this noncontrast  examination. 3. Multilevel degenerative spondylolysis as detailed above, greatest within the lower lumbar spine at the L3-4 level. Please see above report for a full description of these findings. Electronically Signed   By: Jeannine Boga M.D.   On: 05/13/2017 05:00   Ct L-spine No Charge  Result Date: 05/13/2017 CLINICAL DATA:  Initial evaluation for acute right-sided chest pain, extending into right and left sites of face with numbness and tingling in right leg. EXAM: CT CHEST, ABDOMEN AND PELVIS WITHOUT CONTRAST CT OF THE THORACIC AND LUMBAR SPINE WITHOUT CONTRAST. TECHNIQUE: Multidetector CT imaging of the chest, abdomen and pelvis as well as the thoracic and lumbar spine was performed following the standard protocol without IV contrast. COMPARISON:  Prior radiograph from earlier same day. FINDINGS: CT CHEST FINDINGS Cardiovascular: Intrathoracic aorta of normal caliber. Scattered atheromatous plaque within the aortic arch. Partially visualized great vessels within normal limits. Heart size within normal limits. No pericardial effusion. Left-sided pacemaker/ AICD in place. Right-sided Port-A-Cath in place as well. Mediastinum/Nodes: Thyroid normal. No pathologically enlarged mediastinal, hilar, or axillary lymph nodes identified. Sequelae of prior right axillary dissection noted. Esophagus within normal limits. Lungs/Pleura: Tracheostomy tube in place within the upper airway. Tracheobronchial tree patent. Hazy atelectatic changes present dependently within the lower lobes bilaterally. No focal infiltrates. No pulmonary edema or pleural effusion. No pneumothorax. No worrisome pulmonary nodule or mass. Musculoskeletal: Sequelae of prior mastectomy noted. Scattered lytic and sclerotic changes within the visualized spine, likely related osseous metastases. No acute osseous abnormality. Cervical ACDF noted. CT ABDOMEN PELVIS FINDINGS Hepatobiliary: Limited noncontrast evaluation liver unremarkable.  Gallbladder within normal limits. No biliary dilatation. Pancreas: Pancreas within normal limits. Spleen: Spleen within normal limits. Adrenals/Urinary Tract: 2.5 cm intermediate density right adrenal nodule, indeterminate. Adrenal glands otherwise unremarkable. Right kidneys somewhat atrophic as compared to the left. Punctate 2 mm nonobstructive stone present within the lower pole the right kidney. No other radiopaque calculi. No hydronephrosis or hydroureter. Bladder within normal limits. Stomach/Bowel: Stomach within normal limits. No evidence for bowel obstruction. Mild colonic diverticulosis without evidence for acute diverticulitis. No acute inflammatory changes seen about the bowels. No findings to suggest acute appendicitis. 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 No aneurysm. No adenopathy. Flattening of the IVC, suggesting dehydration/ hypovolemia. Reproductive: Uterus is absent. Left ovary within normal limits. Right ovary not discretely identified. Other: No free air or fluid. Diastasis of the rectus abdominis musculature with small fat containing paraumbilical hernia. Musculoskeletal: Diffuse sclerosis with lucent reasons throughout the visualized osseous structures, likely related to osseous metastases. No acute osseous abnormality. CT THORACIC SPINE: Mild dextroscoliosis. Vertebral bodies otherwise normally aligned with preservation of the normal thoracic kyphosis. No listhesis. Cervical ACDF noted. Diffuse abnormal appearance  of the visualized osseous structures, likely related to diffuse osseous metastases. No associated pathologic fracture. No appreciable extraosseous tumor or significant epidural tumor. Paraspinous soft tissues within normal limits. Multilevel degenerative endplate spurring seen throughout the thoracic spine, greatest within the midthoracic spine. Scattered posterior endplate marginal osteophytic spurring noted as well. No significant canal  stenosis. Mild to moderate bilateral foraminal narrowing noted within the lower thoracic spine at T10-11, T11-12, and T12-L1. CT LUMBAR SPINE: Normal segmentation. Lowest well-formed disc labeled the L5-S1 level. 4 mm anterolisthesis of L3 on L4 and 2 mm anterolisthesis of L4 on L5. Vertebral bodies otherwise normally aligned with preservation of the normal lumbar lordosis. Widespread osseous metastatic disease seen throughout the visualized osseous structures. No pathologic fracture. No significant extra osseous extension of tumor identified. Paraspinous soft tissues demonstrate no acute abnormality. Visualized sacrum intact. L1-2: Mild disc bulge with facet hypertrophy. No canal stenosis. Mild bilateral L1 foraminal stenosis. L2-3: Diffuse disc bulge with moderate facet hypertrophy. Mild canal stenosis. Mild bilateral L2 foraminal narrowing. L3-4: 4 mm anterolisthesis. Diffuse disc bulge with disc desiccation. Advanced facet arthrosis. Moderate to severe canal and bilateral subarticular stenosis. Moderate to severe bilateral L3 foraminal narrowing, left worse than right. L4-5: 2 mm anterolisthesis of L4 on L5. Associated diffuse disc bulge. Advanced facet arthrosis. Patient status post prior decompression at this level. No residual canal stenosis. Foramina appear patent. L5-S1: L5-S1 interspace largely fused in ankylosed. Prior posterior decompression. Right greater than left facet arthrosis. No canal stenosis. Mild bilateral L5 foraminal narrowing. IMPRESSION: CT CHEST, ABDOMEN, AND PELVIS IMPRESSION: 1. No acute abnormality identified within the chest, abdomen, and pelvis. 2. Diffuse abnormal appearance of the visualized osseous structures, consistent with diffuse osseous metastases. 3. Chronic right renal atrophy with associated 2 mm nonobstructive nephrolithiasis. 4. Mild colonic diverticulosis without evidence for acute diverticulitis. 5. 2.5 cm right adrenal nodule, indeterminate. Follow-up exam with  dedicated adrenal mass protocol CT and/or MRI suggested for full characterization. 6. Sequelae of prior mastectomy with right axillary nodal dissection. 7. Tracheostomy tube in place in the upper airway. CT THORACIC AND LUMBAR SPINE IMPRESSION: 1. No acute abnormality within the thoracic and lumbar spine. 2. Diffuse osseous metastatic disease. No associated pathologic fracture. No appreciable extra osseous or epidural tumor identified on this noncontrast examination. 3. Multilevel degenerative spondylolysis as detailed above, greatest within the lower lumbar spine at the L3-4 level. Please see above report for a full description of these findings. Electronically Signed   By: Jeannine Boga M.D.   On: 05/13/2017 05:00   Dg Chest Portable 1 View  Result Date: 05/13/2017 CLINICAL DATA:  Acute onset of generalized chest pain. Initial encounter. EXAM: PORTABLE CHEST 1 VIEW COMPARISON:  PET/CT performed 01/14/2017, and chest radiograph performed 07/18/2016 FINDINGS: The lungs are well-aerated. Mild bibasilar airspace opacities may reflect atelectasis or possibly mild pneumonia. There is no evidence of pleural effusion or pneumothorax. The cardiomediastinal silhouette is borderline normal in size. A pacemaker is noted overlying the left chest wall, with leads ending at the right atrium and over the right ventricle. No acute osseous abnormalities are seen. The patient's tracheostomy tube is seen ending 7 cm above the carina. Cervical spinal fusion hardware is noted. IMPRESSION: Mild bibasilar airspace opacities may reflect atelectasis or possibly mild pneumonia. Electronically Signed   By: Garald Balding M.D.   On: 05/13/2017 02:13        Scheduled Meds: . gabapentin  300 mg Oral TID  . latanoprost  1 drop Both Eyes QHS  .  letrozole  2.5 mg Oral Daily  . pantoprazole  40 mg Oral Daily  . potassium chloride  10 mEq Oral TID   Continuous Infusions: . sodium chloride 50 mL/hr at 05/14/17 0500  . sodium  chloride Stopped (05/14/17 0500)  . sodium chloride    . levETIRAcetam Stopped (05/13/17 2241)     LOS: 1 day    Time spent: 40 minutes    Quantay Zaremba, Geraldo Docker, MD [facility] Pager [phone]   If 7PM-7AM, please contact night-coverage [email or web address] Password [email or web address] 05/14/2017, 7:14 AM

## 2017-05-14 NOTE — Progress Notes (Signed)
Went to pick up platelets for transfusion per order. When I returned to the unit with the platelets the blood bank called for me to return them and instructed me to wait for the crossmatched platelets to arrive from [facility].

## 2017-05-14 NOTE — Progress Notes (Signed)
HEMATOLOGY-ONCOLOGY PROGRESS NOTE  SUBJECTIVE: Patient is tearful. Able to move legs, No bleeding symptoms S/P cross matched platelet transfusion with improvement of platelet counts  OBJECTIVE: REVIEW OF SYSTEMS:   Constitutional: Denies fevers, chills or abnormal weight loss Eyes: Denies blurriness of vision Ears, nose, mouth, throat, and face: Trach secretions need cleared Respiratory: Denies cough, dyspnea or wheezes Cardiovascular: Denies palpitation, chest discomfort Gastrointestinal:  Denies nausea, heartburn or change in bowel habits Skin: Denies abnormal skin rashes Lymphatics: Denies new lymphadenopathy or easy bruising Neurological:Neuropathy Behavioral/Psych: Depressed  Extremities: No lower extremity edema All other systems were reviewed with the patient and are negative.  PHYSICAL EXAMINATION: ECOG PERFORMANCE STATUS: 3 - Symptomatic, >50% confined to bed  Vitals:   05/14/17 1245 05/14/17 1532  BP: (!) 162/89 (!) 158/78  Pulse:    Resp: 18 (!) 24  Temp: 98.3 F (36.8 C) 98 F (36.7 C)   Filed Weights   05/13/17 0130  Weight: 230 lb (104.3 kg)    GENERAL:alert, no distress and comfortable SKIN: skin color, texture, turgor are normal, no rashes or significant lesions EYES: normal, Conjunctiva are pink and non-injected, sclera clear OROPHARYNX:no exudate, no erythema and lips, buccal mucosa, and tongue normal  NECK: Trach LYMPH:  no palpable lymphadenopathy in the cervical, axillary or inguinal LUNGS: clear to auscultation and percussion with normal breathing effort HEART: regular rate & rhythm and no murmurs and no lower extremity edema ABDOMEN:abdomen soft, non-tender and normal bowel sounds Musculoskeletal:no cyanosis of digits and no clubbing  NEURO: alert & oriented x 3 with fluent speech, very mild confusion  LABORATORY DATA:  I have reviewed the data as listed CMP Latest Ref Rng & Units 05/14/2017 05/13/2017 05/13/2017  Glucose 65 - 99 mg/dL 130(H)  120(H) 122(H)  BUN 6 - 20 mg/dL 24(H) 26(H) 24(H)  Creatinine 0.44 - 1.00 mg/dL 1.93(H) 2.10(H) 2.02(H)  Sodium 135 - 145 mmol/L 134(L) 136 134(L)  Potassium 3.5 - 5.1 mmol/L 4.2 4.0 4.0  Chloride 101 - 111 mmol/L 104 104 104  CO2 22 - 32 mmol/L 23 - 20(L)  Calcium 8.9 - 10.3 mg/dL 9.6 - 10.0  Total Protein 6.4 - 8.3 g/dL - - -  Total Bilirubin 0.20 - 1.20 mg/dL - - -  Alkaline Phos 40 - 150 U/L - - -  AST 5 - 34 U/L - - -  ALT 0 - 55 U/L - - -    Lab Results  Component Value Date   WBC 7.3 05/14/2017   HGB 8.1 (L) 05/14/2017   HCT 25.1 (L) 05/14/2017   MCV 90.9 05/14/2017   PLT 70 (L) 05/14/2017   NEUTROABS 4.1 05/07/2017    ASSESSMENT AND PLAN: 1. Severe thrombocytopenia: After crossmatch platelets platelet count is up to 70. Patient is extremely relieved to hear that her platelet count has improved. I anticipate that this should help with the subdural hematoma. The platelet count is likely to come down if the patient does not produce on platelets. She may need more platelet transfusions at the platelet count drops below 50.  2. metastatic breast cancer: Currently only on antiestrogen therapy. Palbociclib is discontinued. 3. We will permanently discontinue anticoagulation with Coumadin. 4. Behcets disease 5. CKD 6. Patient needs to start ambulating. I discussed with her that if she can maintain her platelets above 50 and move around then she could be discharged home. She will try to get up and move tomorrow. We will watch her closely.

## 2017-05-14 NOTE — Evaluation (Signed)
Physical Therapy Evaluation Patient Details Name: [name] MRN: [id number] DOB: 09-01-1951 Today's Date: 05/14/2017   History of Present Illness  [name] is a 66 y.o. female with medical history significant of CVA, permanent tracheostomy, hypertension, GERD, C KD, chronic hypokalemia, CHF, metastatic breast cancer, atrial fibrillation with pacemaker placement,, brain aneurysm, admitted with acute onset of bilateral LE weakness and numbness.  Clinical Impression  Pt admitted with/for bil LE weakness and numbness.  Evaluation was limited to strength testing and limited bed mobility.  Pt currently limited functionally due to the problems listed. ( See problems list.)   Pt will benefit from PT to maximize function and safety in order to get ready for next venue listed below.     Follow Up Recommendations SNF    Equipment Recommendations  Other (comment) (TBA)    Recommendations for Other Services       Precautions / Restrictions Precautions Precautions: Fall      Mobility  Bed Mobility Overal bed mobility: Needs Assistance Bed Mobility: Rolling Rolling: Supervision (rail)            Transfers                 General transfer comment: NT pt aware that platelets are low and defers all mobility.  Ambulation/Gait                Stairs            Wheelchair Mobility    Modified Rankin (Stroke Patients Only)       Balance                                             Pertinent Vitals/Pain Pain Assessment: Faces Faces Pain Scale: Hurts even more Pain Location: R>L LE Pain Descriptors / Indicators: Burning (tingling) Pain Intervention(s): Limited activity within patient's tolerance    Home Living Family/patient expects to be discharged to:: Private residence Living Arrangements: Alone Available Help at Discharge: Other (Comment) (Pt states she "won't" ask for help unless the need is dire.) Type of Home:  House Home Access: Ramped entrance     Home Layout: One level Home Equipment: Shower seat;Bedside commode;Hand held Tourist information centre manager - 2 wheels      Prior Function Level of Independence: Independent         Comments: friends help with cleaning and errands     Hand Dominance   Dominant Hand: Right    Extremity/Trunk Assessment   Upper Extremity Assessment Upper Extremity Assessment: Generalized weakness    Lower Extremity Assessment Lower Extremity Assessment: Generalized weakness;RLE deficits/detail;LLE deficits/detail RLE Deficits / Details: can lift leg against gravity, pt won't let therapist flex knee, quad firing and unable/unwilling to relax. RLE Sensation:  (reports numbness/tingling.) RLE Coordination: decreased fine motor;decreased gross motor LLE Deficits / Details: moves against gravity, flexion at hip/knee functional LLE Sensation:  (reports numbness/tigling.) LLE Coordination: decreased fine motor       Communication   Communication: Tracheostomy  Cognition Arousal/Alertness: Awake/alert;Lethargic Behavior During Therapy: Flat affect Overall Cognitive Status: No family/caregiver present to determine baseline cognitive functioning                                        General Comments General comments (skin integrity, edema, etc.):  vitals functional  HR 85    Exercises     Assessment/Plan    PT Assessment Patient needs continued PT services  PT Problem List Decreased strength;Decreased range of motion;Decreased activity tolerance;Impaired sensation;Pain       PT Treatment Interventions Gait training;Functional mobility training;Therapeutic activities;Therapeutic exercise;Balance training;Patient/family education    PT Goals (Current goals can be found in the Care Plan section)  Acute Rehab PT Goals Patient Stated Goal: pt no amenable to discussing goals today PT Goal Formulation: Patient unable to participate in goal  setting Time For Goal Achievement: 05/28/17 Potential to Achieve Goals: Fair    Frequency Min 3X/week   Barriers to discharge Other (comment) (pt won't relay what assist she has available.)      Co-evaluation               AM-PAC PT "6 Clicks" Daily Activity  Outcome Measure Difficulty turning over in bed (including adjusting bedclothes, sheets and blankets)?: A Little Difficulty moving from lying on back to sitting on the side of the bed? : A Little Difficulty sitting down on and standing up from a chair with arms (e.g., wheelchair, bedside commode, etc,.)?: A Lot Help needed moving to and from a bed to chair (including a wheelchair)?: A Lot Help needed walking in hospital room?: A Lot Help needed climbing 3-5 steps with a railing? : A Lot 6 Click Score: 14    End of Session   Activity Tolerance: Other (comment) (pt deferring sitting EOB) Patient left: in bed;with call bell/phone within reach Nurse Communication: Other (comment) (pt deferred mobility) PT Visit Diagnosis: Other abnormalities of gait and mobility (R26.89);Muscle weakness (generalized) (M62.81);Difficulty in walking, not elsewhere classified (R26.2);Pain Pain - Right/Left:  (bil) Pain - part of body: Leg    Time: 4503-8882 PT Time Calculation (min) (ACUTE ONLY): 16 min   Charges:   PT Evaluation $PT Eval Moderate Complexity: 1 Mod     PT G Codes:        May 16, 2017  Donnella Sham, PT [phone]  (pager)  Tessie Fass Keeana Pieratt 05-16-17, 5:35 PM

## 2017-05-15 DIAGNOSIS — D649 Anemia, unspecified: Secondary | ICD-10-CM

## 2017-05-15 LAB — PREPARE PLATELET PHERESIS
UNIT DIVISION: 0
Unit division: 0
Unit division: 0

## 2017-05-15 LAB — BPAM PLATELET PHERESIS
BLOOD PRODUCT EXPIRATION DATE: 201808092359
Blood Product Expiration Date: 201808082359
Blood Product Expiration Date: 201808072359
ISSUE DATE / TIME: 201808071124
ISSUE DATE / TIME: 201808070005
ISSUE DATE / TIME: 201808070539
UNIT TYPE AND RH: 6200
UNIT TYPE AND RH: 6200
Unit Type and Rh: 6200

## 2017-05-15 LAB — CBC
HEMATOCRIT: 25.1 % — AB (ref 36.0–46.0)
HEMATOCRIT: 25.9 % — AB (ref 36.0–46.0)
HEMOGLOBIN: 8.1 g/dL — AB (ref 12.0–15.0)
HEMOGLOBIN: 8.5 g/dL — AB (ref 12.0–15.0)
MCH: 29.3 pg (ref 26.0–34.0)
MCH: 29.4 pg (ref 26.0–34.0)
MCHC: 32.3 g/dL (ref 30.0–36.0)
MCHC: 32.8 g/dL (ref 30.0–36.0)
MCV: 90.9 fL (ref 78.0–100.0)
MCV: 89.6 fL (ref 78.0–100.0)
Platelets: 32 10*3/uL — ABNORMAL LOW (ref 150–400)
Platelets: 23 10*3/uL — CL (ref 150–400)
RBC: 2.76 MIL/uL — ABNORMAL LOW (ref 3.87–5.11)
RBC: 2.89 MIL/uL — ABNORMAL LOW (ref 3.87–5.11)
RDW: 19.6 % — AB (ref 11.5–15.5)
RDW: 19.3 % — ABNORMAL HIGH (ref 11.5–15.5)
WBC: 7.6 10*3/uL (ref 4.0–10.5)
WBC: 7.1 10*3/uL (ref 4.0–10.5)

## 2017-05-15 LAB — HEMOGLOBIN A1C
Hgb A1c MFr Bld: 6.8 % — ABNORMAL HIGH (ref 4.8–5.6)
MEAN PLASMA GLUCOSE: 148.46 mg/dL

## 2017-05-15 LAB — GLUCOSE, CAPILLARY
GLUCOSE-CAPILLARY: 117 mg/dL — AB (ref 65–99)
Glucose-Capillary: 115 mg/dL — ABNORMAL HIGH (ref 65–99)
Glucose-Capillary: 108 mg/dL — ABNORMAL HIGH (ref 65–99)
Glucose-Capillary: 96 mg/dL (ref 65–99)
Glucose-Capillary: 76 mg/dL (ref 65–99)

## 2017-05-15 MED ORDER — OXYCODONE HCL 5 MG PO TABS
5.0000 mg | ORAL_TABLET | ORAL | Status: DC | PRN
Start: 2017-05-15 — End: 2017-05-18

## 2017-05-15 MED ORDER — SODIUM CHLORIDE 0.9 % IV SOLN
Freq: Once | INTRAVENOUS | Status: AC
  Administered 2017-05-15: 21:00:00 via INTRAVENOUS

## 2017-05-15 MED ORDER — INSULIN ASPART 100 UNIT/ML ~~LOC~~ SOLN
0.0000 [IU] | Freq: Three times a day (TID) | SUBCUTANEOUS | Status: DC
  Administered 2017-05-18: 1 [IU] via SUBCUTANEOUS

## 2017-05-15 MED ORDER — SODIUM CHLORIDE 0.9% FLUSH
10.0000 mL | INTRAVENOUS | Status: DC | PRN
  Administered 2017-05-17 – 2017-05-18 (×3): 10 mL
  Filled 2017-05-15 (×3): qty 40

## 2017-05-15 MED ORDER — VITAMIN D 1000 UNITS PO TABS
1000.0000 [IU] | ORAL_TABLET | Freq: Every day | ORAL | Status: DC
  Administered 2017-05-16 – 2017-05-18 (×3): 1000 [IU] via ORAL
  Filled 2017-05-15 (×3): qty 1

## 2017-05-15 MED ORDER — INSULIN ASPART 100 UNIT/ML ~~LOC~~ SOLN: 0.0000 [IU] | Freq: Every day | SUBCUTANEOUS | Status: DC

## 2017-05-15 NOTE — Progress Notes (Signed)
Patient desating to the low 80s, tach care done but patient refuses to use any supplemental oxygen. Patient educated on need for oxygen but she declined any additional interventions at this time. Will continue to monitor.

## 2017-05-15 NOTE — Progress Notes (Signed)
HEMATOLOGY-ONCOLOGY PROGRESS NOTE  SUBJECTIVE: Slight bleeding from trach. Platelets decreased  OBJECTIVE: REVIEW OF SYSTEMS:   Constitutional: Denies fevers, chills or abnormal weight loss Eyes: face puffiness Ears, nose, mouth, throat, and face: Denies mucositis or sore throat Respiratory: Denies cough, dyspnea or wheezes Cardiovascular: Denies palpitation, chest discomfort Gastrointestinal:  Denies nausea, heartburn or change in bowel habits Skin: Denies abnormal skin rashes Lymphatics: Denies new lymphadenopathy or easy bruising Neurological:Denies numbness, tingling or new weaknesses Behavioral/Psych: Mood is stable, no new changes  Extremities: No lower extremity edema All other systems were reviewed with the patient and are negative.  I have reviewed the past medical history, past surgical history, social history and family history with the patient and they are unchanged from previous note.   PHYSICAL EXAMINATION: ECOG PERFORMANCE STATUS: 2  Vitals:   05/15/17 2115 05/15/17 2141  BP: (!) 145/64   Pulse: 99 97  Resp: 13 16  Temp: 98.7 F (37.1 C)    Filed Weights   05/13/17 0130  Weight: 230 lb (104.3 kg)    GENERAL:alert, no distress and comfortable SKIN: skin color, texture, turgor are normal, no rashes or significant lesions EYES: normal, Conjunctiva are pink and non-injected, sclera clear OROPHARYNX:no exudate, no erythema and lips, buccal mucosa, and tongue normal  NECK: supple, thyroid normal size, non-tender, without nodularity LYMPH:  no palpable lymphadenopathy in the cervical, axillary or inguinal LUNGS: clear to auscultation and percussion with normal breathing effort HEART: regular rate & rhythm and no murmurs and no lower extremity edema ABDOMEN:abdomen soft, non-tender and normal bowel sounds Musculoskeletal:no cyanosis of digits and no clubbing  NEURO: alert & oriented x 3 with fluent speech, no focal motor/sensory deficits  LABORATORY DATA:  I  have reviewed the data as listed CMP Latest Ref Rng & Units 05/14/2017 05/13/2017 05/13/2017  Glucose 65 - 99 mg/dL 130(H) 120(H) 122(H)  BUN 6 - 20 mg/dL 24(H) 26(H) 24(H)  Creatinine 0.44 - 1.00 mg/dL 1.93(H) 2.10(H) 2.02(H)  Sodium 135 - 145 mmol/L 134(L) 136 134(L)  Potassium 3.5 - 5.1 mmol/L 4.2 4.0 4.0  Chloride 101 - 111 mmol/L 104 104 104  CO2 22 - 32 mmol/L 23 - 20(L)  Calcium 8.9 - 10.3 mg/dL 9.6 - 10.0  Total Protein 6.4 - 8.3 g/dL - - -  Total Bilirubin 0.20 - 1.20 mg/dL - - -  Alkaline Phos 40 - 150 U/L - - -  AST 5 - 34 U/L - - -  ALT 0 - 55 U/L - - -    Lab Results  Component Value Date   WBC 7.1 05/15/2017   HGB 8.5 (L) 05/15/2017   HCT 25.9 (L) 05/15/2017   MCV 89.6 05/15/2017   PLT 23 (LL) 05/15/2017   NEUTROABS 4.1 05/07/2017    ASSESSMENT AND PLAN: 1. Severe Thrombocytopenia: S/P cross matched platelets Patient will need regular platelet transfusions until she stabilizes. Plan to keep Platelets >50 2. Oral anticoagulation has ben discontinued. 3. Severe Anemia: Stable. If patient is able to move adequately, She can be discharged home with out patient follow up

## 2017-05-15 NOTE — Progress Notes (Signed)
[facility] TEAM 1 - Stepdown/ICU TEAM  JESSICALYNN DESHONG  IDP:[id number] DOB: March 16, 1951 DOA: 05/13/2017 PCP: Arnetha Courser, MD    Brief Narrative:  66yo F w/ a Hx of CVA, Chronic Respiratory Failure with Permanent Tracheostomy, HTN, Chronic Diastolic CHF, A. fib S/P Permanent Pacemaker, MI, HTN, CKD stage III, Behcet's syndrome, and Right Metastatic Breast cancer who presented with acute onset bilateral lower extremity numbness and weakness w/o loss of bowel or bladder function.   Her ED evaluation revealed a left anterior falcine SDH and ?left anterior parasaggital contusion.  Subjective: Pt is lethargic at the time of my exam, but she is able to converse with me for a short period.  She denies cp, sob, n/v, or abdom pain. She is unable to provide a detailed ROS.    Assessment & Plan:  L anterior falcine SDH and ?left anterior parasaggital contusion No intervention indicated or appropriate per NS - no known head trauma - no MRI due to pacer - follow clinically   LE weakness / numbness CT cervical and thoracic spine w/o findings to explain LE weakness - consider CT meylogram if plt count stabilizes > 50K  R Breast CA w/ mets to bone / bone marrow infiltration  Oncology has seen - s/p R matectomy 12/13/16 - currently only on antiestrogen tx   Panyctopenia due to Chemotherapy - Thrombocytopenia predominance presently  Care as per Heme/Onc - plt count peakedat 70K post transfusion, but has rapidly declined again to 32k  Permanent tracheostomy No resp distress at this time   Chronic Afib Previously on coumadin - will NOT resume anticoag of any kind - NSR at this time   HTN  BP currently well controlled   DM CBG well controlled   Peripheral neuropathy   Behchet's disease  CKD Stage 3 crt has been stable since admit - baseline last month ~1.8  Recent Labs Lab 05/13/17 0208 05/13/17 0227 05/14/17 0245  CREATININE 2.02* 2.10* 1.93*     DVT prophylaxis: SCDs only  Code  Status: FULL CODE Family Communication: no family present at time of exam  Disposition Plan: SDU   Consultants:  Neurosurgery  Heme/Onc  Procedures: none  Antimicrobials:  none  Objective: Blood pressure (!) 141/79, pulse 81, temperature 98.1 F (36.7 C), temperature source Oral, resp. rate 15, height 5\' 4"  (1.626 m), weight 104.3 kg (230 lb), SpO2 93 %.  Intake/Output Summary (Last 24 hours) at 05/15/17 1109 Last data filed at 05/15/17 0824  Gross per 24 hour  Intake             1851 ml  Output             1250 ml  Net              601 ml   Filed Weights   05/13/17 0130  Weight: 104.3 kg (230 lb)    Examination: General: No acute respiratory distress at rest - somnolent - lethargic, but appears stable  Lungs: Clear to auscultation bilaterally without wheezes or crackles Cardiovascular: Regular rate and rhythm without murmur gallop or rub normal S1 and S2 Abdomen: Nontender, nondistended, soft, bowel sounds positive, no rebound, no ascites, no appreciable mass Extremities: No significant cyanosis, clubbing, or edema bilateral lower extremities  CBC:  Recent Labs Lab 05/13/17 0208 05/13/17 0227 05/13/17 1041 05/14/17 0245 05/14/17 1430 05/15/17 0335  WBC 11.9*  --  7.5 6.6 7.3 7.6  HGB 8.3* 7.5* 7.5* 8.3* 8.1* 8.1*  HCT 25.3* 22.0* 23.3*  25.7* 25.1* 25.1*  MCV 92.3  --  91.0 89.9 90.9 90.9  PLT 6*  --  12* 5* 70* 32*   Basic Metabolic Panel:  Recent Labs Lab 05/13/17 0208 05/13/17 0227 05/14/17 0245  NA 134* 136 134*  K 4.0 4.0 4.2  CL 104 104 104  CO2 20*  --  23  GLUCOSE 122* 120* 130*  BUN 24* 26* 24*  CREATININE 2.02* 2.10* 1.93*  CALCIUM 10.0  --  9.6   GFR: Estimated Creatinine Clearance: 34.6 mL/min (A) (by C-G formula based on SCr of 1.93 mg/dL (H)).  Liver Function Tests: No results for input(s): AST, ALT, ALKPHOS, BILITOT, PROT, ALBUMIN in the last 168 hours. No results for input(s): LIPASE, AMYLASE in the last 168 hours. No results  for input(s): AMMONIA in the last 168 hours.  Coagulation Profile:  Recent Labs Lab 05/13/17 0211  INR 1.27    Cardiac Enzymes:  Recent Labs Lab 05/13/17 1041 05/13/17 1643  TROPONINI 0.03* <0.03    HbA1C: Hgb A1c MFr Bld  Date/Time Value Ref Range Status  05/15/2017 03:35 AM 6.8 (H) 4.8 - 5.6 % Final    Comment:    (NOTE) Pre diabetes:          5.7%-6.4% Diabetes:              >6.4% Glycemic control for   <7.0% adults with diabetes   11/30/2013 02:55 PM 5.9 4.6 - 6.5 % Final    Comment:    Glycemic Control Guidelines for People with Diabetes:Non Diabetic:  <6%Goal of Therapy: <7%Additional Action Suggested:  >8%     CBG:  Recent Labs Lab 05/14/17 2219 05/15/17 0420 05/15/17 0733  GLUCAP 113* 115* 108*    Recent Results (from the past 240 hour(s))  TECHNOLOGIST REVIEW     Status: None   Collection Time: 05/07/17  2:12 PM  Result Value Ref Range Status   Technologist Review   Final    1% Blast, 7% metamyelocytes, 3% myelocytes, few shistocytes, helmet cells and teardrop RBCs, moderate polychromasia.  MRSA PCR Screening     Status: None   Collection Time: 05/13/17  9:25 AM  Result Value Ref Range Status   MRSA by PCR NEGATIVE NEGATIVE Final    Comment:        The GeneXpert MRSA Assay (FDA approved for NASAL specimens only), is one component of a comprehensive MRSA colonization surveillance program. It is not intended to diagnose MRSA infection nor to guide or monitor treatment for MRSA infections.      Scheduled Meds: . gabapentin  300 mg Oral TID  . hydrALAZINE  5 mg Intravenous Q4H  . insulin aspart  0-9 Units Subcutaneous Q4H  . latanoprost  1 drop Both Eyes QHS  . letrozole  2.5 mg Oral Daily  . pantoprazole  40 mg Oral Daily  . potassium chloride  10 mEq Oral TID    LOS: 2 days   Cherene Altes, MD [facility] Office  [phone] Pager - Text Page per Amion as per below:  On-Call/Text Page:      [email or web address]       password [email or web address]  If 7PM-7AM, please contact night-coverage [email or web address] Password [email or web address] 05/15/2017, 11:09 AM

## 2017-05-15 NOTE — Evaluation (Signed)
Occupational Therapy Evaluation Patient Details Name: [name] MRN: [id number] DOB: March 19, 1951 Today's Date: 05/15/2017    History of Present Illness CAYLIE SANDQUIST is a 66 y.o. female with medical history significant of CVA, permanent tracheostomy, hypertension, GERD, C KD, chronic hypokalemia, CHF, metastatic breast cancer, atrial fibrillation with pacemaker placement,, brain aneurysm, admitted with acute onset of bilateral LE weakness and numbness.   Clinical Impression   Pt was living alone prior to admission. Pt with lethargy and fatigue, keeping eyes close much of session. Pt is a poor historian with conflicting information about home set up,equipment and assistance from PT evaluation. Pt with poor activity tolerance, sat EOB with increased encouragement, but only remained briefly before returning to sidelying stating, "I just want to lay here and keep my legs warm." Will follow acutely. At this point, recommending SNF for continued rehab.    Follow Up Recommendations  SNF    Equipment Recommendations  Other (comment) (defer to next venue)    Recommendations for Other Services       Precautions / Restrictions Precautions Precautions: Fall Restrictions Weight Bearing Restrictions: No      Mobility Bed Mobility Overal bed mobility: Needs Assistance Bed Mobility: Rolling;Sidelying to Sit;Sit to Sidelying Rolling: Modified independent (Device/Increase time) (used rail) Sidelying to sit: Mod assist     Sit to sidelying: Mod assist General bed mobility comments: assist to raise trunk and for LEs back into bed  Transfers                 General transfer comment: pt refused     Balance Overall balance assessment: Needs assistance Sitting-balance support: Bilateral upper extremity supported;Single extremity supported Sitting balance-Leahy Scale: Fair                                     ADL either performed or assessed with clinical judgement    ADL Overall ADL's : Needs assistance/impaired Eating/Feeding: Set up;Sitting Eating/Feeding Details (indicate cue type and reason): ate ice chips Grooming: Wash/dry hands;Wash/dry face;Sitting;Supervision/safety   Upper Body Bathing: Minimal assistance;Sitting   Lower Body Bathing: Maximal assistance;Bed level   Upper Body Dressing : Minimal assistance;Sitting   Lower Body Dressing: Total assistance;Bed level                       Vision Patient Visual Report: No change from baseline Additional Comments: difficult to assess due to lethargy     Perception     Praxis      Pertinent Vitals/Pain Pain Assessment: Faces Faces Pain Scale: No hurt Pain Intervention(s): Monitored during session     Hand Dominance Right   Extremity/Trunk Assessment Upper Extremity Assessment Upper Extremity Assessment: Overall WFL for tasks assessed (full ROM)   Lower Extremity Assessment Lower Extremity Assessment: Defer to PT evaluation       Communication Communication Communication: Tracheostomy;No difficulties   Cognition Arousal/Alertness: Awake/alert;Lethargic Behavior During Therapy: Flat affect Overall Cognitive Status: No family/caregiver present to determine baseline cognitive functioning                                 General Comments: poor historian, guarded, falling asleep unless stimulated   General Comments       Exercises     Shoulder Instructions      Home Living Family/patient expects to be discharged to::  Private residence Living Arrangements: Alone Available Help at Discharge: Other (Comment) (none) Type of Home: House Home Access: Ramped entrance     Home Layout: One level     Bathroom Shower/Tub: Teacher, early years/pre: Handicapped height         Additional Comments: pt with inconsistencies in report of home set up vs what she reported to PT      Prior Functioning/Environment Level of Independence:  Independent        Comments: did not offer information about assistance at home, reports she has a high boys toilet and does not sit to shower        OT Problem List: Decreased strength;Decreased activity tolerance;Impaired balance (sitting and/or standing);Decreased knowledge of use of DME or AE;Decreased cognition      OT Treatment/Interventions: Self-care/ADL training;DME and/or AE instruction;Therapeutic activities;Cognitive remediation/compensation;Patient/family education;Balance training    OT Goals(Current goals can be found in the care plan section) Acute Rehab OT Goals Patient Stated Goal: to lay here and keep my legs warm OT Goal Formulation: Patient unable to participate in goal setting Time For Goal Achievement: 05/29/17 Potential to Achieve Goals: Good ADL Goals Pt Will Perform Grooming: with min assist;standing Pt Will Perform Upper Body Dressing: with set-up;sitting Pt Will Perform Lower Body Dressing: with min assist;sit to/from stand Pt Will Transfer to Toilet: with min assist;ambulating;bedside commode Pt Will Perform Toileting - Clothing Manipulation and hygiene: with min assist;sit to/from stand  OT Frequency: Min 2X/week   Barriers to D/C: Decreased caregiver support          Co-evaluation              AM-PAC PT "6 Clicks" Daily Activity     Outcome Measure Help from another person eating meals?: None Help from another person taking care of personal grooming?: A Little Help from another person toileting, which includes using toliet, bedpan, or urinal?: A Lot Help from another person bathing (including washing, rinsing, drying)?: A Lot Help from another person to put on and taking off regular upper body clothing?: A Little Help from another person to put on and taking off regular lower body clothing?: Total 6 Click Score: 15   End of Session    Activity Tolerance: Patient limited by fatigue;Patient limited by lethargy Patient left: in bed;with  call bell/phone within reach  OT Visit Diagnosis: Muscle weakness (generalized) (M62.81);Other symptoms and signs involving cognitive function                Time: 5027-7412 OT Time Calculation (min): 15 min Charges:  OT General Charges $OT Visit: 1 Procedure OT Evaluation $OT Eval Moderate Complexity: 1 Procedure G-Codes:     Malka So 05/15/2017, 9:18 AM  [phone]

## 2017-05-16 DIAGNOSIS — E1140 Type 2 diabetes mellitus with diabetic neuropathy, unspecified: Secondary | ICD-10-CM

## 2017-05-16 LAB — CBC
HEMATOCRIT: 24.8 % — AB (ref 36.0–46.0)
Hemoglobin: 8 g/dL — ABNORMAL LOW (ref 12.0–15.0)
MCH: 29.4 pg (ref 26.0–34.0)
MCHC: 32.3 g/dL (ref 30.0–36.0)
MCV: 91.2 fL (ref 78.0–100.0)
PLATELETS: 50 10*3/uL — AB (ref 150–400)
RBC: 2.72 MIL/uL — ABNORMAL LOW (ref 3.87–5.11)
RDW: 19.5 % — AB (ref 11.5–15.5)
WBC: 6.3 10*3/uL (ref 4.0–10.5)

## 2017-05-16 LAB — PREPARE PLATELET PHERESIS: Unit division: 0

## 2017-05-16 LAB — COMPREHENSIVE METABOLIC PANEL
ALBUMIN: 3 g/dL — AB (ref 3.5–5.0)
ALT: 47 U/L (ref 14–54)
AST: 227 U/L — AB (ref 15–41)
Alkaline Phosphatase: 125 U/L (ref 38–126)
Anion gap: 10 (ref 5–15)
BUN: 20 mg/dL (ref 6–20)
CHLORIDE: 103 mmol/L (ref 101–111)
CO2: 23 mmol/L (ref 22–32)
CREATININE: 1.7 mg/dL — AB (ref 0.44–1.00)
Calcium: 10.6 mg/dL — ABNORMAL HIGH (ref 8.9–10.3)
GFR calc Af Amer: 36 mL/min — ABNORMAL LOW (ref >60)
GFR, EST NON AFRICAN AMERICAN: 31 mL/min — AB (ref >60)
GLUCOSE: 99 mg/dL (ref 65–99)
Potassium: 3.9 mmol/L (ref 3.5–5.1)
Sodium: 136 mmol/L (ref 135–145)
Total Bilirubin: 1.2 mg/dL (ref 0.3–1.2)
Total Protein: 6.4 g/dL — ABNORMAL LOW (ref 6.5–8.1)

## 2017-05-16 LAB — CBC WITH DIFFERENTIAL/PLATELET
Band Neutrophils: 0 %
Basophils Absolute: 0 10*3/uL (ref 0.0–0.1)
Basophils Relative: 0 %
Blasts: 0 %
EOS PCT: 1 %
Eosinophils Absolute: 0.1 10*3/uL (ref 0.0–0.7)
HCT: 25.2 % — ABNORMAL LOW (ref 36.0–46.0)
Hemoglobin: 8.1 g/dL — ABNORMAL LOW (ref 12.0–15.0)
Lymphocytes Relative: 34 %
Lymphs Abs: 2.2 10*3/uL (ref 0.7–4.0)
MCH: 28.9 pg (ref 26.0–34.0)
MCHC: 32.1 g/dL (ref 30.0–36.0)
MCV: 90 fL (ref 78.0–100.0)
METAMYELOCYTES PCT: 0 %
MONO ABS: 0.4 10*3/uL (ref 0.1–1.0)
MYELOCYTES: 0 %
Monocytes Relative: 7 %
Neutro Abs: 3.7 10*3/uL (ref 1.7–7.7)
Neutrophils Relative %: 58 %
PLATELETS: 65 10*3/uL — AB (ref 150–400)
Promyelocytes Absolute: 0 %
RBC: 2.8 MIL/uL — AB (ref 3.87–5.11)
RDW: 19 % — ABNORMAL HIGH (ref 11.5–15.5)
WBC: 6.4 10*3/uL (ref 4.0–10.5)
nRBC: 0 /100 WBC

## 2017-05-16 LAB — BPAM PLATELET PHERESIS
BLOOD PRODUCT EXPIRATION DATE: 201808102359
ISSUE DATE / TIME: 201808082049
UNIT TYPE AND RH: 2800

## 2017-05-16 LAB — GLUCOSE, CAPILLARY
GLUCOSE-CAPILLARY: 101 mg/dL — AB (ref 65–99)
GLUCOSE-CAPILLARY: 102 mg/dL — AB (ref 65–99)
Glucose-Capillary: 100 mg/dL — ABNORMAL HIGH (ref 65–99)
Glucose-Capillary: 82 mg/dL (ref 65–99)

## 2017-05-16 LAB — TYPE AND SCREEN
ABO/RH(D): AB POS
ANTIBODY SCREEN: NEGATIVE

## 2017-05-16 MED ORDER — SODIUM CHLORIDE 0.9 % IV SOLN: Freq: Once | INTRAVENOUS | Status: DC

## 2017-05-16 NOTE — Progress Notes (Signed)
Physical Therapy Treatment Patient Details Name: [name] MRN: [id number] DOB: 02-Oct-1951 Today's Date: 05/16/2017    History of Present Illness [name] is a 66 y.o. female with medical history significant of CVA, permanent tracheostomy, hypertension, GERD, C KD, chronic hypokalemia, CHF, metastatic breast cancer, atrial fibrillation with pacemaker placement,, brain aneurysm, admitted with acute onset of bilateral LE weakness and numbness.    PT Comments    Patient is making progress toward mobility goals. Pt demonstrated balance and cognitive deficits as well as generalized weakness increasing risk for falls. Current plan remains appropriate.    Follow Up Recommendations  SNF     Equipment Recommendations  Other (comment) (TBA)    Recommendations for Other Services       Precautions / Restrictions Precautions Precautions: Fall Restrictions Weight Bearing Restrictions: No    Mobility  Bed Mobility Overal bed mobility: Modified Independent             General bed mobility comments: use of rail; increased time and effort  Transfers Overall transfer level: Needs assistance Equipment used: Rolling walker (2 wheeled) Transfers: Sit to/from Stand Sit to Stand: Min assist;Min guard         General transfer comment: min guard from EOB and from [facility]; min A from recliner with X2 attempts; cues for safe hand placement  Ambulation/Gait Ambulation/Gait assistance: Min assist Ambulation Distance (Feet): 40 Feet Assistive device: Rolling walker (2 wheeled) Gait Pattern/deviations: Step-through pattern;Decreased step length - right;Decreased step length - left     General Gait Details: cues for posture; assist to steady   Stairs            Wheelchair Mobility    Modified Rankin (Stroke Patients Only)       Balance Overall balance assessment: Needs assistance Sitting-balance support: Feet supported;No upper extremity supported Sitting  balance-Leahy Scale: Fair     Standing balance support: Bilateral upper extremity supported;During functional activity                                Cognition Arousal/Alertness: Awake/alert Behavior During Therapy: Flat affect Overall Cognitive Status: No family/caregiver present to determine baseline cognitive functioning Area of Impairment: Safety/judgement;Awareness;Problem solving                         Safety/Judgement: Decreased awareness of safety;Decreased awareness of deficits Awareness: Intellectual Problem Solving: Decreased initiation;Difficulty sequencing;Requires verbal cues General Comments: pt required step by step cues for tasks and believed she does not need O2 although pt desats on RA      Exercises      General Comments General comments (skin integrity, edema, etc.): VSS      Pertinent Vitals/Pain Pain Assessment: No/denies pain    Home Living                      Prior Function            PT Goals (current goals can now be found in the care plan section) Progress towards PT goals: Progressing toward goals    Frequency    Min 3X/week      PT Plan Current plan remains appropriate    Co-evaluation              AM-PAC PT "6 Clicks" Daily Activity  Outcome Measure  Difficulty turning over in bed (including adjusting bedclothes, sheets and blankets)?: A  Little Difficulty moving from lying on back to sitting on the side of the bed? : A Little Difficulty sitting down on and standing up from a chair with arms (e.g., wheelchair, bedside commode, etc,.)?: Total Help needed moving to and from a bed to chair (including a wheelchair)?: A Lot Help needed walking in hospital room?: A Little Help needed climbing 3-5 steps with a railing? : A Lot 6 Click Score: 14    End of Session Equipment Utilized During Treatment: Gait belt Activity Tolerance: Patient tolerated treatment well Patient left: with call  bell/phone within reach;in chair Nurse Communication: Mobility status PT Visit Diagnosis: Other abnormalities of gait and mobility (R26.89);Muscle weakness (generalized) (M62.81);Difficulty in walking, not elsewhere classified (R26.2);Pain Pain - Right/Left:  (bil) Pain - part of body: Leg     Time: 1430-1501 PT Time Calculation (min) (ACUTE ONLY): 31 min  Charges:  $Gait Training: 8-22 mins $Therapeutic Activity: 8-22 mins                    G Codes:       Earney Navy, PTA Pager: [phone]     Darliss Cheney 05/16/2017, 5:08 PM

## 2017-05-16 NOTE — NC FL2 (Signed)
[place] LEVEL OF CARE SCREENING TOOL     IDENTIFICATION  Patient Name: [name] Birthdate: 1951-04-25 Sex: female Admission Date (Current Location): 05/13/2017  [facility] and [place] Number:  Herbalist and Address:  [facility], [place] [address] [place], [place] [address]      Provider Number: [id number]  Attending Physician Name and Address:  Allie Bossier, MD  Relative Name and Phone Number:       Current Level of Care: Hospital Recommended Level of Care: [place] Prior Approval Number:    Date Approved/Denied:   PASRR Number: [id number]  Discharge Plan: SNF    Current Diagnoses: Patient Active Problem List   Diagnosis Date Noted  . Acute subdural hematoma ([place]) 05/13/2017  . Thrombocytopenia ([place]) 05/13/2017  . Bilateral leg numbness 05/13/2017  . Malignant neoplasm of upper-outer quadrant of right breast in female, estrogen receptor positive ([place]) 01/21/2017  . Breast cancer ([place]) 12/13/2016  . Decreased breath sounds at left lung base 11/13/2016  . Anemia due to antineoplastic chemotherapy 09/25/2016  . Anemia 09/25/2016  . Port catheter in place 08/31/2016  . Breast cancer of upper-inner quadrant of right female breast ([place]) 07/05/2016  . Hypokalemia 05/30/2016  . Mass of breast, right 05/30/2016  . Proptosis 05/30/2016  . Chronic kidney disease (CKD), stage III (moderate) 08/11/2015  . Cramp in muscle 07/11/2015  . H/O injury, presenting hazards to health 07/11/2015  . Esophagitis, reflux 07/11/2015  . Chronic pain due to injury 07/11/2015  . Chronic hypokalemia 07/11/2015  . Atrial fibrillation, chronic ([place]) 07/11/2015  . CAD in native artery 07/11/2015  . Erythema nodosum, acute form 07/11/2015  . Pain in joint of left wrist 07/11/2015  . Hypertension goal BP (blood pressure) < 140/90 01/04/2014  . Glaucoma 01/04/2014  . 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 Inflammation  of uveal tract 01/04/2014  . 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 Tracheostomy status ([place]) 09/23/2012  . Aneurysm, cerebral 08/13/2012  . Cerebral vascular accident ([place]) 08/13/2012  . 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 Encounter for monitoring coumadin therapy 06/11/2012    Orientation RESPIRATION BLADDER Height & Weight     Situation, Place, Time, Self  Tracheostomy (28%) Incontinent Weight: 230 lb (104.3 kg) Height:  5\' 4"  (162.6 cm)  BEHAVIORAL SYMPTOMS/MOOD NEUROLOGICAL BOWEL NUTRITION STATUS      Continent  (Please see d/c summary)  AMBULATORY STATUS COMMUNICATION OF NEEDS Skin   Extensive Assist Verbally                         Personal Care Assistance Level of Assistance  Bathing, Feeding, Dressing Bathing Assistance: Maximum assistance Feeding assistance: Limited assistance Dressing Assistance: Maximum assistance     Functional Limitations Info  Sight, Hearing, Speech Sight Info: Adequate Hearing Info: Adequate Speech Info: Adequate    SPECIAL CARE FACTORS FREQUENCY  PT (By licensed PT), OT (By licensed OT)     PT Frequency: 3x week OT Frequency: 3x week            Contractures Contractures Info: Not present    Additional Factors Info  Code Status, Allergies Code Status Info: Full Code Allergies Info: Lisinopril, Eggs Or Egg-derived Products           Current Medications (05/16/2017):  This is the current hospital active medication list Current Facility-Administered Medications  Medication Dose Route Frequency Provider Last Rate Last  Dose  . 0.45 % sodium chloride infusion   Intravenous Continuous Waldemar Dickens, MD 50 mL/hr at 05/15/17 1100    . 0.9 %  sodium chloride infusion   Intravenous Once Allie Bossier, MD      . acetaminophen (TYLENOL) tablet 650 mg  650 mg Oral Q6H PRN Waldemar Dickens, MD       Or  . acetaminophen (TYLENOL) suppository 650 mg  650 mg Rectal Q6H PRN Waldemar Dickens, MD       . cholecalciferol (VITAMIN D) tablet 1,000 Units  1,000 Units Oral Daily Cherene Altes, MD   1,000 Units at 05/16/17 [phone]  . gabapentin (NEURONTIN) capsule 300 mg  300 mg Oral TID Waldemar Dickens, MD   300 mg at 05/16/17 0944  . hydrALAZINE (APRESOLINE) injection 5 mg  5 mg Intravenous Q4H Allie Bossier, MD   5 mg at 05/16/17 1431  . insulin aspart (novoLOG) injection 0-5 Units  0-5 Units Subcutaneous QHS Joette Catching T, MD      . insulin aspart (novoLOG) injection 0-9 Units  0-9 Units Subcutaneous TID WC Cherene Altes, MD   Stopped at 05/16/17 0700  . latanoprost (XALATAN) 0.005 % ophthalmic solution 1 drop  1 drop Both Eyes QHS Waldemar Dickens, MD   1 drop at 05/15/17 2306  . letrozole [facility]) tablet 2.5 mg  2.5 mg Oral Daily Waldemar Dickens, MD   2.5 mg at 05/16/17 0944  . levETIRAcetam (KEPPRA) 500 mg in sodium chloride 0.9 % 100 mL IVPB  500 mg Intravenous Q12H Waldemar Dickens, MD   Stopped at 05/16/17 1046  . ondansetron (ZOFRAN) tablet 4 mg  4 mg Oral Q6H PRN Waldemar Dickens, MD       Or  . ondansetron [facility]) injection 4 mg  4 mg Intravenous Q6H PRN Waldemar Dickens, MD   4 mg at 05/15/17 1505  . oxyCODONE (Oxy IR/ROXICODONE) immediate release tablet 5-10 mg  5-10 mg Oral Q4H PRN Cherene Altes, MD      . pantoprazole (PROTONIX) EC tablet 40 mg  40 mg Oral Daily Waldemar Dickens, MD   40 mg at 05/16/17 0944  . prochlorperazine (COMPAZINE) tablet 10 mg  10 mg Oral Q6H PRN Reubin Milan, MD      . sodium chloride flush (NS) 0.9 % injection 10-40 mL  10-40 mL Intracatheter PRN Nicholas Lose, MD      . tiZANidine (ZANAFLEX) tablet 2 mg  2 mg Oral TID PRN Allie Bossier, MD   2 mg at 05/16/17 6979     Discharge Medications: Please see discharge summary for a list of discharge medications.  Relevant Imaging Results:  Relevant Lab Results:   Additional Information SSN: [id number]  Eileen Stanford, LCSW

## 2017-05-16 NOTE — Progress Notes (Signed)
Patient arrived to [facility]. Patient is alert, oriented x3, skin is intact, patient states she does her own tracheostomy care, asking for sterile flushes, flushes given, pt set up on tele box#11, CCMD aware.

## 2017-05-16 NOTE — Progress Notes (Signed)
HEMATOLOGY-ONCOLOGY PROGRESS NOTE  SUBJECTIVE:Patient was sleeping when I arrived. I did not wake her up.  OBJECTIVE: REVIEW OF SYSTEMS:   Not obtained because she was sleeping.  PHYSICAL EXAMINATION:  Vitals:   05/16/17 1140 05/16/17 1147  BP:  123/72  Pulse: 82 75  Resp: 18 (!) 27  Temp:  98.4 F (36.9 C)  SpO2: (!) 89% (!) 85%   Filed Weights   05/13/17 0130  Weight: 230 lb (104.3 kg)   Physical examination was not performed LABORATORY DATA:  I have reviewed the data as listed CMP Latest Ref Rng & Units 05/16/2017 05/14/2017 05/13/2017  Glucose 65 - 99 mg/dL 99 130(H) 120(H)  BUN 6 - 20 mg/dL 20 24(H) 26(H)  Creatinine 0.44 - 1.00 mg/dL 1.70(H) 1.93(H) 2.10(H)  Sodium 135 - 145 mmol/L 136 134(L) 136  Potassium 3.5 - 5.1 mmol/L 3.9 4.2 4.0  Chloride 101 - 111 mmol/L 103 104 104  CO2 22 - 32 mmol/L 23 23 -  Calcium 8.9 - 10.3 mg/dL 10.6(H) 9.6 -  Total Protein 6.5 - 8.1 g/dL 6.4(L) - -  Total Bilirubin 0.3 - 1.2 mg/dL 1.2 - -  Alkaline Phos 38 - 126 U/L 125 - -  AST 15 - 41 U/L 227(H) - -  ALT 14 - 54 U/L 47 - -    Lab Results  Component Value Date   WBC 6.3 05/16/2017   HGB 8.0 (L) 05/16/2017   HCT 24.8 (L) 05/16/2017   MCV 91.2 05/16/2017   PLT 50 (L) 05/16/2017   NEUTROABS 4.1 05/07/2017    ASSESSMENT AND PLAN: 1. Severe anemia and thrombocytopenia: Hemoglobin 8, platelet count 50 Patient is getting 1 unit of crossmatched platelets again today. Patient has a home health agency that can check her blood work 2-3 times a week. Depending on her blood work remained consider giving her additional platelets in the outpatient setting. 2. Metastatic breast cancer: We cannot use palbociclib any more

## 2017-05-16 NOTE — Progress Notes (Signed)
PROGRESS NOTE    [name]  YPP:[id number] DOB: [birth year] DOA: 05/13/2017 PCP: Arnetha Courser, MD   Brief Narrative:  66 y.o. BF PMHx CVA, Chronic Respiratory Failure with Permanent Tracheostomy, HTN, Chronic Diastolic CHF?, A. fib S/P Permanent Pacemaker, MI, HTN, CKD stage III, Behcet's syndrome, Right Metastatic Breast cancer  Level V caveat applies to history obtained by patient due to patient being fatigued and wanting to sleep and not be bothered. Patient not very engaging and overall exam at time of admission and appears to be a poor historian with more generalities then specifics with regards to her current physical complaints.  Patient presenting with acute onset bilateral lower extremity numbness and weakness. Denies saddle anesthesia or loss of bowel or bladder function or lumbar back pain. Symptoms are constant but slowly improving. Patient states that she did not attempt to walk but simply hit her life alert button and came to the hospital for evaluation and workup. She's never had this problem before. Is not tried anything to alleviate her symptoms.  Patient states she's had headache off and on but appears to be unrelated to current symptoms as she has these chronically. At time of my exam pt denies any facial numbness which is different from what pt reported to EDP.   Patient also endorses a few intermittent episodes of chest pain. Short lived. These start to occur after realizing her legs were numb. Denies any associated palpitations, LE swelling, orthopnea, radiation of pain to her neck and jaw or left shoulder. No associated diaphoresis or nausea or shortness of breath.    Subjective: 8/9 A/O 4, negative CP, negative increased SOB, negative N/V, negative abdominal pain. Patient's basic concern is that she would like to get out of the bed.        Assessment & Plan:   Active Problems:   Artificial cardiac pacemaker   Tracheostomy status ([name])   Chronic pain  due to injury   Atrial fibrillation, chronic ([name])   Chronic kidney disease (CKD), stage III (moderate)   Breast cancer of upper-inner quadrant of right female breast ([name])   Anemia due to antineoplastic chemotherapy   Acute subdural hematoma ([name])   Thrombocytopenia ([name])   Bilateral leg numbness  Acute subdural hematoma  -Patient's neurologic deficits improved -Not candidate for MRI secondary to pacemaker -Negative history head trauma/CVA -Patient mental status improved will allow out of bed with help to chair only. -Decreased neuro checks to QID -8/6 transfused 1 unit platelets -8/6 transfuse 1 unit PRBC -8/7 transfused 2 units crossmatched platelets -8/8 transfused 1 unit crossmatched platelets -8/9 transfuse 1 unit crossmatched platelets -Keppra 500 mg BID seizure protocol  -Continue to hold all anticoagulants  Lower extremity Numbness/Weakness -Transfuse 1 unit crossmatched platelets today then will obtain C-spine/T-spine myelogram per neuro hospitalist recommendation   Thrombocytopenia Severe -Platelet count 50 -See SDH  -If platelets > 50 and remained stable overnight or and obtain C/T spine myelogram per neurology recommendation  Anemia (baseline HgB ~8.3) -Monitor closely  Recent Labs Lab 05/13/17 1041 05/14/17 0245 05/14/17 1430 05/15/17 0335 05/15/17 1843 05/16/17 0620  HGB 7.5* 8.3* 8.1* 8.1* 8.5* 8.0*  -At baseline  Tracheostomy permanent -Trach care per floor protocol   Chronic atrial fibrillation  --Rate controlled -A/P pacemaker -Coumadin on hold secondary to SDH -Hydralazine 5 mg  q 4hr SBP goal 140-160 -Transfuse for hemoglobin <8   Essential HTN -See A. Fib  Rt Breast Cancer metastatic -T4 N1 M1 stage IV diffuse bony metastases again  noted on imaging in ED -S/P Rt mastectomy 12/13/16 -Last chemotherapy 01/21/17 -Continue Femara -Treatment per oncology Dr. Nicholas Lose  Chronic pain syndrome -Continue current pain regimen. 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 -Neurontin 300 mg TID  CKD stage III (baseline Cr 1.3-2.0) -Monitor closely Lab Results  Component Value Date   CREATININE 1.70 (H) 05/16/2017   CREATININE 1.93 (H) 05/14/2017   CREATININE 2.10 (H) 05/13/2017  -Hold all nephrotoxic medication -At baseline    Goals of care -PT/OT consulted: Recommend SNF         DVT prophylaxis: SCD Code Status: Full Family Communication: None Disposition Plan: SNF   Consultants:  Oncology Neurosurgery    Procedures/Significant Events:  8/6 PCXR:, Pneumonia vs atelectasis especially right middle lobe 8/6 CT head WO contrast;-acute SDH along anterior inferior falx and overlying Lt anterior cerebral convexity- -Subacute/chronic Lt subdural collection overlying left parietal convexity. 8/6 CT chest/abdomen and pelvis WO contrast:-Negative abnormality chest/abdomen/pelvis  -Chronic nonobstructive nephrolithiasis right kidney  -2.5 cm right adrenal nodule  -Tracheostomy in place 8/6 CT L-spine: Multilevel DJD, DDD. L3-L4 Anterolisthesis     VENTILATOR SETTINGS: None   Cultures   Antimicrobials: Anti-infectives    Start     Stop   05/14/17 0600  vancomycin (VANCOCIN) IVPB 1000 mg/200 mL premix  Status:  Discontinued     05/13/17 0943   05/13/17 0330  ceFEPIme (MAXIPIME) 2 g in dextrose 5 % 50 mL IVPB     05/13/17 0420   05/13/17 0330  vancomycin (VANCOCIN) IVPB 1000 mg/200 mL premix     05/13/17 0445       Devices    LINES / TUBES:      Continuous Infusions: . sodium chloride 50 mL/hr at 05/15/17 1100  . levETIRAcetam Stopped (05/15/17 2348)     Objective: Vitals:   05/15/17 2310 05/16/17 0300 05/16/17 0319 05/16/17 0330  BP: (!) 161/71 132/67 132/67 (!) 139/57  Pulse: 93  92 80    Resp: 19  16 19   Temp: 98.8 F (37.1 C)  98.9 F (37.2 C)   TempSrc: Oral  Oral   SpO2: 96%  97% 93%  Weight:      Height:        Intake/Output Summary (Last 24 hours) at 05/16/17 0709 Last data filed at 05/16/17 0400  Gross per 24 hour  Intake             1406 ml  Output                0 ml  Net             1406 ml   Filed Weights   05/13/17 0130  Weight: 230 lb (104.3 kg)   Physical Exam:  General: A/O 4, No acute respiratory distress Neck:  Negative scars, masses, torticollis, lymphadenopathy, JVD Lungs: Clear to auscultation bilaterally without wheezes or crackles Cardiovascular: Regular rate and rhythm without murmur gallop or rub normal S1 and S2 Abdomen: Morbidly obese, negative abdominal pain, nondistended, positive soft, bowel sounds, no rebound, no ascites, no appreciable mass Extremities: No significant cyanosis, clubbing, or edema bilateral lower extremities Skin: Negative rashes, lesions, ulcers Psychiatric:  Positive depression, negative anxiety, negative fatigue, negative mania  Central nervous system:  Cranial nerves II through XII intact, tongue/uvula midline, all extremities muscle strength 5/5,  sensation intact throughout, finger nose finger bilateral within normal limits, quick finger touch bilateral within normal limits, negative dysarthria, negative expressive aphasia, negative receptive aphasia.     .     Data Reviewed: Care during the described time interval was provided by me .  I have reviewed this patient's available data, including medical history, events of note, physical examination, and all test results as part of my evaluation. I have personally reviewed and interpreted all radiology studies.  CBC:  Recent Labs Lab 05/13/17 1041 05/14/17 0245 05/14/17 1430 05/15/17 0335 05/15/17 1843  WBC 7.5 6.6 7.3 7.6 7.1  HGB 7.5* 8.3* 8.1* 8.1* 8.5*  HCT 23.3* 25.7* 25.1* 25.1* 25.9*  MCV 91.0 89.9 90.9 90.9 89.6  PLT 12* 5* 70* 32* 23*    Basic Metabolic Panel:  Recent Labs Lab 05/13/17 0208 05/13/17 0227 05/14/17 0245  NA 134* 136 134*  K 4.0 4.0 4.2  CL 104 104 104  CO2 20*  --  23  GLUCOSE 122* 120* 130*  BUN 24* 26* 24*  CREATININE 2.02* 2.10* 1.93*  CALCIUM 10.0  --  9.6   GFR: Estimated Creatinine Clearance: 34.6 mL/min (A) (by C-G formula based on SCr of 1.93 mg/dL (H)). Liver Function Tests: No results for input(s): AST, ALT, ALKPHOS, BILITOT, PROT, ALBUMIN in the last 168 hours. No results for input(s): LIPASE, AMYLASE in the last 168 hours. No results for input(s): AMMONIA in the last 168 hours. Coagulation Profile:  Recent Labs Lab 05/13/17 0211  INR 1.27   Cardiac Enzymes:  Recent Labs Lab 05/13/17 1041 05/13/17 1643  TROPONINI 0.03* <0.03   BNP (last 3 results) No results for input(s): PROBNP in the last 8760 hours. HbA1C:  Recent Labs  05/15/17 0335  HGBA1C 6.8*   CBG:  Recent Labs Lab 05/15/17 0420 05/15/17 0733 05/15/17 1121 05/15/17 1624 05/15/17 2047  GLUCAP 115* 108* 96 76 117*   Lipid Profile: No results for input(s): CHOL, HDL, LDLCALC, TRIG, CHOLHDL, LDLDIRECT in the last 72 hours. Thyroid Function Tests: No results for input(s): TSH, T4TOTAL, FREET4, T3FREE, THYROIDAB in the last 72 hours. Anemia Panel: No results for input(s): VITAMINB12, FOLATE, FERRITIN, TIBC, IRON, RETICCTPCT in the last 72 hours. Urine analysis:    Component Value Date/Time   COLORURINE YELLOW 05/13/2017 1230   APPEARANCEUR HAZY (A) 05/13/2017 1230   LABSPEC 1.014 05/13/2017 1230   PHURINE 6.0 05/13/2017 1230   GLUCOSEU NEGATIVE 05/13/2017 1230   GLUCOSEU NEGATIVE 11/30/2013 1455   HGBUR LARGE (A) 05/13/2017 1230   BILIRUBINUR NEGATIVE 05/13/2017 1230   BILIRUBINUR neg 11/25/2013 1528   KETONESUR NEGATIVE 05/13/2017 1230   PROTEINUR 100 (A) 05/13/2017 1230   UROBILINOGEN 0.2 11/30/2013 1455   NITRITE NEGATIVE 05/13/2017 1230   LEUKOCYTESUR LARGE (A) 05/13/2017 1230    Sepsis Labs: @LABRCNTIP (procalcitonin:4,lacticidven:4)  ) Recent Results (from the past 240 hour(s))  TECHNOLOGIST REVIEW     Status: None   Collection Time: 05/07/17  2:12 PM  Result Value Ref Range Status   Technologist Review   Final    1% Blast, 7% metamyelocytes, 3% myelocytes, few shistocytes, helmet cells and teardrop RBCs, moderate polychromasia.  MRSA PCR Screening     Status: None   Collection Time: 05/13/17  9:25 AM  Result Value Ref Range Status   MRSA by PCR NEGATIVE NEGATIVE Final    Comment:        The GeneXpert MRSA Assay (FDA approved for NASAL specimens only), is one component of a comprehensive MRSA colonization  surveillance program. It is not intended to diagnose MRSA infection nor to guide or monitor treatment for MRSA infections.          Radiology Studies: No results found.      Scheduled Meds: . cholecalciferol  1,000 Units Oral Daily  . gabapentin  300 mg Oral TID  . hydrALAZINE  5 mg Intravenous Q4H  . insulin aspart  0-5 Units Subcutaneous QHS  . insulin aspart  0-9 Units Subcutaneous TID WC  . latanoprost  1 drop Both Eyes QHS  . letrozole  2.5 mg Oral Daily  . pantoprazole  40 mg Oral Daily   Continuous Infusions: . sodium chloride 50 mL/hr at 05/15/17 1100  . levETIRAcetam Stopped (05/15/17 2348)     LOS: 3 days    Time spent: 40 minutes    Maricruz Lucero, Geraldo Docker, MD [facility] Pager [phone]   If 7PM-7AM, please contact night-coverage [email or web address] Password [email or web address] 05/16/2017, 7:09 AM

## 2017-05-16 NOTE — Care Management Note (Signed)
Case Management Note  Patient Details  Name: DIERRA RIESGO MRN: [id number] Date of Birth: Dec 19, 1950  Subjective/Objective:   Pt admitted with chest tightness and numbness  - non operative subdural               Action/Plan:   PTA from home independent - per pt she has had trach for approximately 10 years.  Pt informed CM that she is self sufficient with trach and receives trach supplies [facility].     Expected Discharge Date:                  Expected Discharge Plan:     In-House Referral:     Discharge planning Services  CM Consult  Post Acute Care Choice:    Choice offered to:     DME Arranged:    DME Agency:     HH Arranged:    HH Agency:     Status of Service:     If discussed at [employer] of Stay Meetings, dates discussed:    additional Comments: Snf recommended - CSW consulted  Maryclare Labrador, RN 05/16/2017, 8:36 AM

## 2017-05-17 ENCOUNTER — Encounter (HOSPITAL_COMMUNITY): Payer: Self-pay | Admitting: General Practice

## 2017-05-17 ENCOUNTER — Other Ambulatory Visit: Payer: Self-pay

## 2017-05-17 ENCOUNTER — Telehealth: Payer: Self-pay

## 2017-05-17 DIAGNOSIS — C50211 Malignant neoplasm of upper-inner quadrant of right female breast: Secondary | ICD-10-CM

## 2017-05-17 DIAGNOSIS — G8921 Chronic pain due to trauma: Secondary | ICD-10-CM

## 2017-05-17 DIAGNOSIS — I482 Chronic atrial fibrillation: Secondary | ICD-10-CM

## 2017-05-17 DIAGNOSIS — Z930 Tracheostomy status: Secondary | ICD-10-CM

## 2017-05-17 DIAGNOSIS — D6481 Anemia due to antineoplastic chemotherapy: Secondary | ICD-10-CM

## 2017-05-17 DIAGNOSIS — R52 Pain, unspecified: Secondary | ICD-10-CM

## 2017-05-17 DIAGNOSIS — R200 Anesthesia of skin: Secondary | ICD-10-CM

## 2017-05-17 DIAGNOSIS — N183 Chronic kidney disease, stage 3 (moderate): Secondary | ICD-10-CM

## 2017-05-17 DIAGNOSIS — J189 Pneumonia, unspecified organism: Secondary | ICD-10-CM

## 2017-05-17 DIAGNOSIS — N179 Acute kidney failure, unspecified: Secondary | ICD-10-CM

## 2017-05-17 DIAGNOSIS — I6201 Nontraumatic acute subdural hemorrhage: Secondary | ICD-10-CM

## 2017-05-17 DIAGNOSIS — T451X5A Adverse effect of antineoplastic and immunosuppressive drugs, initial encounter: Secondary | ICD-10-CM

## 2017-05-17 DIAGNOSIS — Z950 Presence of cardiac pacemaker: Secondary | ICD-10-CM

## 2017-05-17 LAB — PATHOLOGIST SMEAR REVIEW

## 2017-05-17 LAB — BPAM PLATELET PHERESIS
Blood Product Expiration Date: 201808112359
ISSUE DATE / TIME: 201808091122
Unit Type and Rh: 8400

## 2017-05-17 LAB — GLUCOSE, CAPILLARY
GLUCOSE-CAPILLARY: 97 mg/dL (ref 65–99)
GLUCOSE-CAPILLARY: 97 mg/dL (ref 65–99)
GLUCOSE-CAPILLARY: 90 mg/dL (ref 65–99)
Glucose-Capillary: 88 mg/dL (ref 65–99)

## 2017-05-17 LAB — BASIC METABOLIC PANEL
Anion gap: 9 (ref 5–15)
BUN: 22 mg/dL — AB (ref 6–20)
CHLORIDE: 103 mmol/L (ref 101–111)
CO2: 24 mmol/L (ref 22–32)
Calcium: 10.7 mg/dL — ABNORMAL HIGH (ref 8.9–10.3)
Creatinine, Ser: 1.69 mg/dL — ABNORMAL HIGH (ref 0.44–1.00)
GFR calc Af Amer: 36 mL/min — ABNORMAL LOW (ref >60)
GFR, EST NON AFRICAN AMERICAN: 31 mL/min — AB (ref >60)
GLUCOSE: 97 mg/dL (ref 65–99)
POTASSIUM: 3.9 mmol/L (ref 3.5–5.1)
Sodium: 136 mmol/L (ref 135–145)

## 2017-05-17 LAB — CBC
HCT: 24.7 % — ABNORMAL LOW (ref 36.0–46.0)
Hemoglobin: 8.1 g/dL — ABNORMAL LOW (ref 12.0–15.0)
MCH: 29.8 pg (ref 26.0–34.0)
MCHC: 32.8 g/dL (ref 30.0–36.0)
MCV: 90.8 fL (ref 78.0–100.0)
PLATELETS: 59 10*3/uL — AB (ref 150–400)
RBC: 2.72 MIL/uL — ABNORMAL LOW (ref 3.87–5.11)
RDW: 19.2 % — AB (ref 11.5–15.5)
WBC: 6.4 10*3/uL (ref 4.0–10.5)

## 2017-05-17 LAB — PREPARE PLATELET PHERESIS: Unit division: 0

## 2017-05-17 LAB — MAGNESIUM: Magnesium: 2.1 mg/dL (ref 1.7–2.4)

## 2017-05-17 MED ORDER — GABAPENTIN 300 MG PO CAPS
300.0000 mg | ORAL_CAPSULE | Freq: Two times a day (BID) | ORAL | Status: DC
  Administered 2017-05-17 – 2017-05-18 (×2): 300 mg via ORAL
  Filled 2017-05-17 (×2): qty 1

## 2017-05-17 MED ORDER — ENALAPRIL MALEATE 5 MG PO TABS: 5.0000 mg | ORAL_TABLET | Freq: Every day | ORAL | Status: DC

## 2017-05-17 MED ORDER — LOSARTAN POTASSIUM 50 MG PO TABS
25.0000 mg | ORAL_TABLET | Freq: Every day | ORAL | Status: DC
  Administered 2017-05-17 – 2017-05-18 (×2): 25 mg via ORAL
  Filled 2017-05-17 (×2): qty 1

## 2017-05-17 NOTE — Progress Notes (Signed)
Pt agitated at this time because this nurse had to come in to do assessment.  Pt stated, " You have one more time to come up in my damn room and wake me up, you dont know me." I am trying to sleep and you keep coming in here to wake me up all like you do." This nurse informed pt that assessment was necessary to make sure that she was ok.  Pt stated," Well I dont need no damn assessment, I just want to go home." This nurse left out of pt room. Reported to charge nurse.

## 2017-05-17 NOTE — Care Management Note (Signed)
Case Management Note  Patient Details  Name: CHERISH RUNDE MRN: [id number] Date of Birth: [birth year]  Subjective/Objective:                    Action/Plan: Plan is for SNF when medically stable. CM following.  Expected Discharge Date:                  Expected Discharge Plan:     In-House Referral:     Discharge planning Services  CM Consult  Post Acute Care Choice:    Choice offered to:     DME Arranged:    DME Agency:     HH Arranged:    HH Agency:     Status of Service:     If discussed at [employer], dates discussed:    Additional Comments:  Pollie Friar, RN 05/17/2017, 12:04 PM

## 2017-05-17 NOTE — Clinical Social Work Note (Signed)
Clinical Social Work Assessment  Patient Details  Name: [name] MRN: [id number] Date of Birth: 1951/04/19  Date of referral:  05/17/17               Reason for consult:  Facility Placement, Discharge Planning                Permission sought to share information with:  Family Supports Permission granted to share information::  Yes, Verbal Permission Granted  Name::     Public house manager::  SNF  Relationship::  Clinical research associate Information:     Housing/Transportation Living arrangements for the past 2 months:  Single Family Home Source of Information:  Patient, Power of Attorney Patient Interpreter Needed:  None Criminal Activity/Legal Involvement Pertinent to Current Situation/Hospitalization:  No - Comment as needed Significant Relationships:  Friend Lives with:  Self Do you feel safe going back to the place where you live?  Yes Need for family participation in patient care:  Yes (Comment) (patient has HCPOA for when she is incapacitated; but she is able to make her own decisions at this time)  Care giving concerns:  Patient currently lives at home alone with intermittent support from friends and caregivers. Patient would benefit from short term rehab stay in order to improve independence.   Social Worker assessment / plan:  CSW spoke with patient's HCPOA, Lenore, over the phone to discuss recommendation for SNF. Patient's HCPOA indicated agreement with recommendation for SNF, but that she is only able to make decisions for the patient when she's incapacitated; and right now, the patient is still able to make her own healthcare decisions. Patient's HCPOA warned CSW that patient will not be agreeable to go to SNF. CSW met with patient to discuss recommendation for SNF. Patient indicated that she was not interested in rehab whatsoever. CSW encouraged patient to reconsider because the health care team was concerned about her safety when she goes home, and wanting to help her get  stronger before going back home so that she can be more independent. Patient indicated she is able to take care of herself. CSW questioned if patient would at least allow home health services to come and work with her, and she was agreeable to that. Patient confirmed she uses [facility] for her trach supplies, and would prefer to use them to set up the rest of the services as well. CSW contacted patient's HCPOA again to discuss the patient's feedback. Patient's HCPOA indicated understanding, and that she would continue to work on trying to convince the patient to reconsider. CSW indicated to patient's HCPOA that a Education officer, museum can also be added to the home health team, should the patient's HCPOA be able to convince the patient to go after she gets home from the hospital. [place] alerted [facility] of home health needs. CSW signing off.  Employment status:  Retired Nurse, adult PT Recommendations:  [place] / Referral to community resources:  [place]  Patient/Family's Response to care:  Patient's HCPOA agreeable to SNF placement, but patient is not agreeable at this time. Patient's HCPOA indicated that she can't go over the patient's decision making while she's still alert and oriented and able to make her decisions.  Patient/Family's Understanding of and Emotional Response to Diagnosis, Current Treatment, and Prognosis:  Patient was uncooperative during discussion, and appeared agitated. Patient refused to discuss SNF options and asked about when she can go home. Patient's HCPOA was frustrated with the patient's  decision, and discussed how she is exhausted with the patient and how she only thinks of herself. Patient's HCPOA discussed how she keeps falling and having health issues and expects everyone else to take care of her, instead of taking care of herself. Patient's HCPOA appreciated CSW assistance in trying to encourage the patient to go to  SNF.   Emotional Assessment Appearance:  Appears stated age Attitude/Demeanor/Rapport:  Angry, Avoidant, Uncooperative Affect (typically observed):  Agitated, Frustrated, Irritable Orientation:  Oriented to Self, Oriented to Place, Oriented to  Time, Oriented to Situation Alcohol / Substance use:  Not Applicable Psych involvement (Current and /or in the community):  No (Comment)  Discharge Needs  Concerns to be addressed:  Patient refuses services, Care Coordination Readmission within the last 30 days:  No Current discharge risk:  Lives alone, Physical Impairment, Chronically ill Barriers to Discharge:  Continued Medical Work up   [employer], [place] 05/17/2017, 5:08 PM

## 2017-05-17 NOTE — Telephone Encounter (Signed)
Per Dr.Gudena, pt to have home health set up for future lab draws (M,W,F) and type and screen for possible blood and platelet transfusion of Hg less than 8 and plt less than 50. CBC w/diff and CMET orders. Sent referral to [facility] phone [phone] and faxed referral order to [phone]. Labs to be drawn via port access. Spoke with Loma Sousa at [facility] and notified them that pt is still currently admitted at [facility] will try to reach out to pt this weekend or next week.

## 2017-05-17 NOTE — Progress Notes (Signed)
Occupational Therapy Treatment Patient Details Name: [name] MRN: [id number] DOB: Nov 27, 1950 Today's Date: 05/17/2017    History of present illness [name] is a 66 y.o. female with medical history significant of CVA, permanent tracheostomy, hypertension, GERD, C KD, chronic hypokalemia, CHF, metastatic breast cancer, atrial fibrillation with pacemaker placement,, brain aneurysm, admitted with acute onset of bilateral LE weakness and numbness.Pt dx with acute SDH. Pt has had multiple blood transfusions and platelet transfusions this admission, severe thrombocytopenia, LE weakness (neuro hospitalist recommending a c-spine/t-spine myelogram (pending)).    OT comments  Pt progressing towards goals, completes short room level functional mobility using RW with MinGuard assist, BSC transfer and toileting with MinA. Pt with overall flat affect but willing to participate with therapy. Pt will continue to benefit from acute OT needs and post acute OT needs in SNF setting to maximize Pt's safety and independence with ADLs and functional mobility. Will continue to follow.    Follow Up Recommendations  SNF    Equipment Recommendations  Other (comment) (defer to next venue )          Precautions / Restrictions Precautions Precautions: Fall Restrictions Weight Bearing Restrictions: No       Mobility Bed Mobility Overal bed mobility: Modified Independent Bed Mobility: Rolling;Sidelying to Sit;Sit to Sidelying Rolling: Modified independent (Device/Increase time) Sidelying to sit: Modified independent (Device/Increase time);Supervision   Sit to supine: Supervision   General bed mobility comments: use of rail; increased time and effort; supervision for safety   Transfers Overall transfer level: Needs assistance Equipment used: Rolling walker (2 wheeled) Transfers: Sit to/from Stand Sit to Stand: Min guard         General transfer comment: close guard for safety      Balance Overall balance assessment: Needs assistance Sitting-balance support: Feet supported;Bilateral upper extremity supported;No upper extremity supported Sitting balance-Leahy Scale: Good     Standing balance support: Single extremity supported;Bilateral upper extremity supported Standing balance-Leahy Scale: Fair                             ADL either performed or assessed with clinical judgement   ADL Overall ADL's : Needs assistance/impaired     Grooming: Wash/dry hands;Min Dispensing optician: Minimal assistance;Ambulation;BSC   Toileting- Clothing Manipulation and Hygiene: Minimal assistance;Sit to/from stand       Functional mobility during ADLs: Min guard;Rolling walker General ADL Comments: Pt O2 dropped to low 80's during session on RA, increased to above 90% with seated rest break, deep breathing, and supplemental oxygen within approx 30 sec     Cognition Arousal/Alertness: Awake/alert Behavior During Therapy: Flat affect Overall Cognitive Status: No family/caregiver present to determine baseline cognitive functioning Area of Impairment: Safety/judgement;Awareness;Problem solving                         Safety/Judgement: Decreased awareness of safety;Decreased awareness of deficits Awareness: Intellectual Problem Solving: Decreased initiation;Difficulty sequencing;Requires verbal cues                           Pertinent Vitals/ Pain       Pain Assessment: No/denies pain  Frequency  Min 2X/week        Progress Toward Goals  OT Goals(current goals can now be found in the care plan section)  Progress towards OT goals: Progressing toward goals  Acute Rehab OT Goals Patient Stated Goal: to go home OT Goal Formulation: With patient Time For Goal Achievement: 05/29/17 Potential to Achieve Goals: Good   Plan Discharge plan remains appropriate                    AM-PAC PT "6 Clicks" Daily Activity     Outcome Measure   Help from another person eating meals?: None Help from another person taking care of personal grooming?: A Little Help from another person toileting, which includes using toliet, bedpan, or urinal?: A Lot Help from another person bathing (including washing, rinsing, drying)?: A Lot Help from another person to put on and taking off regular upper body clothing?: A Little Help from another person to put on and taking off regular lower body clothing?: A Lot 6 Click Score: 16    End of Session Equipment Utilized During Treatment: Gait belt;Rolling walker  OT Visit Diagnosis: Muscle weakness (generalized) (M62.81);Other symptoms and signs involving cognitive function   Activity Tolerance Patient tolerated treatment well   Patient Left in bed;with call bell/phone within reach;with bed alarm set;with nursing/sitter in room (NT)             Time: 5956-3875 OT Time Calculation (min): 19 min  Charges: OT General Charges $OT Visit: 1 Procedure OT Treatments $Self Care/Home Management : 8-22 mins  Lou Cal, OT Pager [phone] 05/17/2017    Raymondo Band 05/17/2017, 5:11 PM

## 2017-05-17 NOTE — Progress Notes (Signed)
RT note- called to room for decreased sp02 while working with PT, ATC set up and patient was placed on 28%. Patient does not complain of shortness of breath, however does request to be independent with trach care. All supplies and equipment is in room.

## 2017-05-17 NOTE — Progress Notes (Signed)
Hematology-Oncology Patient was once again sleeping when I arrived. Exam was not performed CBC    Component Value Date/Time   WBC 6.4 05/17/2017 0536   RBC 2.72 (L) 05/17/2017 0536   HGB 8.1 (L) 05/17/2017 0536   HGB 8.4 (L) 05/07/2017 1412   HCT 24.7 (L) 05/17/2017 0536   HCT 25.6 (L) 05/07/2017 1412   PLT 59 (L) 05/17/2017 0536   PLT 19 (L) 05/07/2017 1412   PLT 259 07/22/2015 1437   MCV 90.8 05/17/2017 0536   MCV 92.1 05/07/2017 1412   MCH 29.8 05/17/2017 0536   MCHC 32.8 05/17/2017 0536   RDW 19.2 (H) 05/17/2017 0536   RDW 20.1 (H) 05/07/2017 1412   LYMPHSABS 2.2 05/16/2017 1730   LYMPHSABS 2.7 05/07/2017 1412   MONOABS 0.4 05/16/2017 1730   MONOABS 1.1 (H) 05/07/2017 1412   EOSABS 0.1 05/16/2017 1730   EOSABS 0.0 05/07/2017 1412   EOSABS 0.1 07/22/2015 1437   BASOSABS 0.0 05/16/2017 1730   BASOSABS 0.4 (H) 05/07/2017 1412    Thrombocytopenia: Platelets are at 59 (only slightly down from 65 yday) Patient needs platelets only if shes below 50. I arranged for outpatient lab checks through her home health Mon-Wed and Friday with plans for transfusions of cross matched platelets if necessary to keep platelets over 50K. She will not receive Palbociclib anymore She will remain on oral anti estrogen therapy. No more anti-coagulation either. I am away for the next week. Please contact Dr.Magrinat if there are any questions from next Monday onwards.

## 2017-05-17 NOTE — Progress Notes (Signed)
PROGRESS NOTE    [name]  ZHG:[id number] DOB: August 18, 1951 DOA: 05/13/2017 PCP: Arnetha Courser, MD   Brief Narrative: [name] is a 66 y.o. BF PMHx CVA, Chronic Respiratory Failure with Permanent Tracheostomy, HTN, Chronic Diastolic CHF?, A. fib S/P Permanent Pacemaker, MI, HTN, CKD stage III, Behcet's syndrome, Right Metastatic Breast cancer  Level V caveat applies to history obtained by patient due to patient being fatigued and wanting to sleep and not be bothered. Patient not very engaging and overall exam at time of admission and appears to be a poor historian with more generalities then specifics with regards to her current physical complaints.  Patient presenting with acute onset bilateral lower extremity numbness and weakness. Denies saddle anesthesia or loss of bowel or bladder function or lumbar back pain. Symptoms are constant but slowly improving. Patient states that she did not attempt to walk but simply hit her life alert button and came to the hospital for evaluation and workup. She's never had this problem before. Is not tried anything to alleviate her symptoms.  Patient states she's had headache off and on but appears to be unrelated to current symptoms as she has these chronically. At time of my exam pt denies any facial numbness which is different from what pt reported to EDP.   Patient also endorses a few intermittent episodes of chest pain. Short lived. These start to occur after realizing her legs were numb. Denies any associated palpitations, LEswelling, orthopnea, radiation of pain to her neck and jaw or left shoulder. No associated diaphoresis or nausea or shortness of breath.   Assessment & Plan:   Active Problems:   Artificial cardiac pacemaker   Tracheostomy status ([name])   Chronic pain due to injury   Atrial fibrillation, chronic ([name])   Chronic kidney disease (CKD), stage III (moderate)   Breast cancer of upper-inner quadrant of right female  breast ([name])   Anemia due to antineoplastic chemotherapy   Acute subdural hematoma ([name])   Thrombocytopenia ([name])   Bilateral leg numbness   Subdural hematoma Mental status improved and neurological deficits also improved. Patient not a candidate for MRI secondary to pacemaker. Repeat CT head significant for stable hematoma. Likely secondary to thrombocytopenia. Patient has required multiple platelet transfusions. -Continue neuro checks -continue Keppra (Day #5/7)   Lower extremity numbness/weakness CT scan significant for multiple levels of arthritis in addition to moderate-to-severe canal and bilateral subarticular stenosis and L3-4 region. Patient also has widespread osseous metastatic disease seen throughout the osseous structures. Neurosurgery were evaluated him injury and states that MRI or CT myelogram could be performed but would not change patient course as they would not recommend surgery.  Thrombocytopenia Patient has received 5 units of platelets to date. Platelets slightly down from 65-59 today -Oncology recommendations: Transfuse for platelets less than 50 -Repeat CBC in a.m.  Anemia Stable. Transfused 1 unit.  Permanent tracheostomy -Continue trach care  Chronic atrial fibrillation Rate controlled. Status post pacemaker. Patient on Coumadin as an outpatient but held secondary to subdural hematoma.  Essential hypertension Slightly uncontrolled. -start losartan -continue hydralazine for now; likely discontinue tomorrow  Right breast cancer, metastatic Patient follows up outpatient with Dr. Lindi Adie. Patient with diffuse bony metastasis. -Continue Femara -Oncology recommendations  Chronic pain syndrome -Continue oxycodone when necessary  Diabetes mellitus type 2 -Continue sliding scale insulin  Peripheral neuropathy -Decreased to Neurontin 300 mg twice daily secondary to renal function  CKD stage III Baseline   DVT prophylaxis: SCDs Code Status: Full  code Family Communication: None at bedside Disposition Plan: Discharge to SNF when medically stable   Consultants:   Neurosurgery  Oncology  Procedures:   None  Antimicrobials:  None    Subjective: No complaints today  Objective: Vitals:   05/17/17 0735 05/17/17 0900 05/17/17 1148 05/17/17 1405  BP:  (!) 139/57  (!) 145/62  Pulse: 80 88 91 84  Resp: 18 20 20 20   Temp:  99 F (37.2 C)  98.4 F (36.9 C)  TempSrc:  Oral  Oral  SpO2:  94% 96% 95%  Weight:      Height:        Intake/Output Summary (Last 24 hours) at 05/17/17 1508 Last data filed at 05/17/17 1404  Gross per 24 hour  Intake              250 ml  Output              150 ml  Net              100 ml   Filed Weights   05/13/17 0130  Weight: 104.3 kg (230 lb)    Examination:  General exam: Appears calm and comfortable Respiratory system: transmitted upper airway sounds. Respiratory effort normal. Cardiovascular system: S1 & S2 heard, RRR. No murmurs. Gastrointestinal system: Abdomen is nondistended, soft and nontender. Normal bowel sounds heard. Central nervous system: Alert. Extremities: No edema. No calf tenderness Skin: No cyanosis. No rashes Psychiatry: Judgement and insight appear normal. Mood & affect appropriate.    Data Reviewed: I have personally reviewed following labs and imaging studies  CBC:  Recent Labs Lab 05/15/17 0335 05/15/17 1843 05/16/17 0620 05/16/17 1730 05/17/17 0536  WBC 7.6 7.1 6.3 6.4 6.4  NEUTROABS  --   --   --  3.7  --   HGB 8.1* 8.5* 8.0* 8.1* 8.1*  HCT 25.1* 25.9* 24.8* 25.2* 24.7*  MCV 90.9 89.6 91.2 90.0 90.8  PLT 32* 23* 50* 65* 59*   Basic Metabolic Panel:  Recent Labs Lab 05/13/17 0208 05/13/17 0227 05/14/17 0245 05/16/17 0620 05/17/17 0536  NA 134* 136 134* 136 136  K 4.0 4.0 4.2 3.9 3.9  CL 104 104 104 103 103  CO2 20*  --  23 23 24   GLUCOSE 122* 120* 130* 99 97  BUN 24* 26* 24* 20 22*  CREATININE 2.02* 2.10* 1.93* 1.70* 1.69*    CALCIUM 10.0  --  9.6 10.6* 10.7*  MG  --   --   --   --  2.1   GFR: Estimated Creatinine Clearance: 39.6 mL/min (A) (by C-G formula based on SCr of 1.69 mg/dL (H)). Liver Function Tests:  Recent Labs Lab 05/16/17 0620  AST 227*  ALT 47  ALKPHOS 125  BILITOT 1.2  PROT 6.4*  ALBUMIN 3.0*   No results for input(s): LIPASE, AMYLASE in the last 168 hours. No results for input(s): AMMONIA in the last 168 hours. Coagulation Profile:  Recent Labs Lab 05/13/17 0211  INR 1.27   Cardiac Enzymes:  Recent Labs Lab 05/13/17 1041 05/13/17 1643  TROPONINI 0.03* <0.03   BNP (last 3 results) No results for input(s): PROBNP in the last 8760 hours. HbA1C:  Recent Labs  05/15/17 0335  HGBA1C 6.8*   CBG:  Recent Labs Lab 05/16/17 1257 05/16/17 1654 05/16/17 2104 05/17/17 0627 05/17/17 1150  GLUCAP 100* 82 102* 97 88   Lipid Profile: No results for input(s): CHOL, HDL, LDLCALC, TRIG, CHOLHDL, LDLDIRECT in the last 72  hours. Thyroid Function Tests: No results for input(s): TSH, T4TOTAL, FREET4, T3FREE, THYROIDAB in the last 72 hours. Anemia Panel: No results for input(s): VITAMINB12, FOLATE, FERRITIN, TIBC, IRON, RETICCTPCT in the last 72 hours. Sepsis Labs: No results for input(s): PROCALCITON, LATICACIDVEN in the last 168 hours.  Recent Results (from the past 240 hour(s))  MRSA PCR Screening     Status: None   Collection Time: 05/13/17  9:25 AM  Result Value Ref Range Status   MRSA by PCR NEGATIVE NEGATIVE Final    Comment:        The GeneXpert MRSA Assay (FDA approved for NASAL specimens only), is one component of a comprehensive MRSA colonization surveillance program. It is not intended to diagnose MRSA infection nor to guide or monitor treatment for MRSA infections.          Radiology Studies: No results found.      Scheduled Meds: . cholecalciferol  1,000 Units Oral Daily  . gabapentin  300 mg Oral TID  . hydrALAZINE  5 mg Intravenous  Q4H  . insulin aspart  0-5 Units Subcutaneous QHS  . insulin aspart  0-9 Units Subcutaneous TID WC  . latanoprost  1 drop Both Eyes QHS  . letrozole  2.5 mg Oral Daily  . pantoprazole  40 mg Oral Daily   Continuous Infusions: . sodium chloride 50 mL/hr at 05/17/17 0139  . sodium chloride    . levETIRAcetam Stopped (05/17/17 1029)     LOS: 4 days     Cordelia Poche, MD [facility] 05/17/2017, 3:08 PM Pager: [phone]  If 7PM-7AM, please contact night-coverage [email or web address] Password [email or web address] 05/17/2017, 3:08 PM

## 2017-05-17 NOTE — Progress Notes (Signed)
Physical Therapy Treatment Patient Details Name: [name] MRN: [id number] DOB: September 03, 1951 Today's Date: 05/17/2017    History of Present Illness [name] is a 66 y.o. female with medical history significant of CVA, permanent tracheostomy, hypertension, GERD, C KD, chronic hypokalemia, CHF, metastatic breast cancer, atrial fibrillation with pacemaker placement,, brain aneurysm, admitted with acute onset of bilateral LE weakness and numbness.Pt dx with acute SDH. Pt has had multiple blood transfusions and platelet transfusions this admission, severe thrombocytopenia, LE weakness (neuro hospitalist recommending a c-spine/t-spine myelogram (pending)).     PT Comments    Pt on RA at rest 88%, with gait drops to 81% on RA, HR stable throughout gait 80s-90s.  Pt needs min to min guard assist overall and does fatigue quickly requiring one seated rest break during gait.  She is fiercely independent and feels that the pain in her left knee may be "Cancer" worried about metastatic disease.  Pt is progressing well and RT in to see pt at the end of my session.    Follow Up Recommendations  SNF     Equipment Recommendations  None recommended by PT    Recommendations for Other Services   NA     Precautions / Restrictions Precautions Precautions: Fall    Mobility  Bed Mobility Overal bed mobility: Modified Independent                Transfers Overall transfer level: Needs assistance Equipment used: Rolling walker (2 wheeled) Transfers: Sit to/from Stand Sit to Stand: Min guard;Min assist         General transfer comment: Min guard assist from higher bed, min assist from lower recliner and very low toilet.    Ambulation/Gait Ambulation/Gait assistance: Min assist;+2 safety/equipment Ambulation Distance (Feet): 65 Feet Assistive device: Rolling walker (2 wheeled) Gait Pattern/deviations: Step-through pattern;Shuffle;Trunk flexed Gait velocity: decreased Gait velocity  interpretation: Below normal speed for age/gender General Gait Details: Pt followed with chair to help encourage increased gait distance.  O2 sats at rest on RA 88%, during gait 81%, at times with rest, pt would come up to 90-91%, but generally staying in the upper 80s.  HR 80s-90s.  RN called RT to assess.           Balance Overall balance assessment: Needs assistance Sitting-balance support: Feet supported;Bilateral upper extremity supported;No upper extremity supported Sitting balance-Leahy Scale: Good     Standing balance support: Single extremity supported;Bilateral upper extremity supported Standing balance-Leahy Scale: Fair                              Cognition Arousal/Alertness: Awake/alert Behavior During Therapy: Flat affect                                   General Comments: Pt was very flat today, but was able to clean her inner cannula once all the supplies given to her.  She does not feel SOB when walking despite drop in O2 sats, so she doesn't feel the need for O2.  She voiced furstration with slow staff response to her requests last night and this AM.  I did not drill down, but overall cognition was ok.              Pertinent Vitals/Pain Pain Assessment: No/denies pain           PT Goals (current goals can  now be found in the care plan section) Acute Rehab PT Goals Patient Stated Goal: to go home Progress towards PT goals: Progressing toward goals    Frequency    Min 3X/week      PT Plan Current plan remains appropriate       AM-PAC PT "6 Clicks" Daily Activity  Outcome Measure  Difficulty turning over in bed (including adjusting bedclothes, sheets and blankets)?: None Difficulty moving from lying on back to sitting on the side of the bed? : None Difficulty sitting down on and standing up from a chair with arms (e.g., wheelchair, bedside commode, etc,.)?: Total Help needed moving to and from a bed to chair  (including a wheelchair)?: A Little Help needed walking in hospital room?: A Little Help needed climbing 3-5 steps with a railing? : A Little 6 Click Score: 18    End of Session Equipment Utilized During Treatment: Gait belt Activity Tolerance: Patient limited by fatigue Patient left: in chair;with call bell/phone within reach;with chair alarm set Nurse Communication: Mobility status PT Visit Diagnosis: Other abnormalities of gait and mobility (R26.89);Muscle weakness (generalized) (M62.81);Difficulty in walking, not elsewhere classified (R26.2);Pain Pain - Right/Left: Left Pain - part of body: Knee     Time: 6803-2122 PT Time Calculation (min) (ACUTE ONLY): 55 min  Charges:  $Gait Training: 8-22 mins $Therapeutic Activity: 38-52 mins          Chaise Mahabir B. Ramsey, [place], DPT [phone]            05/17/2017, 2:27 PM

## 2017-05-17 NOTE — Progress Notes (Signed)
Pt o2 sat between 83-91% per PT and nurse assessment. Pt refused suctioning and tracheostomy care by nursing stated that she does her own care. Pt performed care with nursing supervision effective. Lungs clear.  Respiratory therapist notified informed this nurse that she is aware and follows up with pt t.i.d. instructed this nurse to notify Md. Md paged. Pt denies shortness of breath. Noted ambulating with PT in the hallway after breathing exercises o2 sat 91%. Will monitor.

## 2017-05-18 LAB — CBC
HCT: 24.4 % — ABNORMAL LOW (ref 36.0–46.0)
Hemoglobin: 7.8 g/dL — ABNORMAL LOW (ref 12.0–15.0)
MCH: 29.1 pg (ref 26.0–34.0)
MCHC: 32 g/dL (ref 30.0–36.0)
MCV: 91 fL (ref 78.0–100.0)
PLATELETS: 36 10*3/uL — AB (ref 150–400)
RBC: 2.68 MIL/uL — AB (ref 3.87–5.11)
RDW: 19.1 % — ABNORMAL HIGH (ref 11.5–15.5)
WBC: 6.3 10*3/uL (ref 4.0–10.5)

## 2017-05-18 LAB — BASIC METABOLIC PANEL
ANION GAP: 12 (ref 5–15)
BUN: 24 mg/dL — ABNORMAL HIGH (ref 6–20)
CO2: 21 mmol/L — AB (ref 22–32)
Calcium: 10.6 mg/dL — ABNORMAL HIGH (ref 8.9–10.3)
Chloride: 103 mmol/L (ref 101–111)
Creatinine, Ser: 1.61 mg/dL — ABNORMAL HIGH (ref 0.44–1.00)
GFR calc Af Amer: 38 mL/min — ABNORMAL LOW (ref >60)
GFR, EST NON AFRICAN AMERICAN: 33 mL/min — AB (ref >60)
GLUCOSE: 122 mg/dL — AB (ref 65–99)
POTASSIUM: 3.7 mmol/L (ref 3.5–5.1)
Sodium: 136 mmol/L (ref 135–145)

## 2017-05-18 LAB — GLUCOSE, CAPILLARY
GLUCOSE-CAPILLARY: 123 mg/dL — AB (ref 65–99)
Glucose-Capillary: 109 mg/dL — ABNORMAL HIGH (ref 65–99)
Glucose-Capillary: 106 mg/dL — ABNORMAL HIGH (ref 65–99)

## 2017-05-18 LAB — MAGNESIUM: Magnesium: 2.2 mg/dL (ref 1.7–2.4)

## 2017-05-18 MED ORDER — LEVETIRACETAM 500 MG PO TABS
500.0000 mg | ORAL_TABLET | Freq: Two times a day (BID) | ORAL | Status: DC
  Filled 2017-05-18: qty 1

## 2017-05-18 MED ORDER — HEPARIN SOD (PORK) LOCK FLUSH 100 UNIT/ML IV SOLN
500.0000 [IU] | INTRAVENOUS | Status: DC
  Filled 2017-05-18: qty 5

## 2017-05-18 MED ORDER — HEPARIN SOD (PORK) LOCK FLUSH 100 UNIT/ML IV SOLN
500.0000 [IU] | INTRAVENOUS | Status: DC | PRN
  Administered 2017-05-18: 500 [IU]
  Filled 2017-05-18 (×2): qty 5

## 2017-05-18 MED ORDER — GABAPENTIN 300 MG PO CAPS: 300.0000 mg | ORAL_CAPSULE | Freq: Two times a day (BID) | ORAL | Status: DC

## 2017-05-18 MED ORDER — SODIUM CHLORIDE 0.9 % IV SOLN
Freq: Once | INTRAVENOUS | Status: AC
  Administered 2017-05-18: 12:00:00 via INTRAVENOUS

## 2017-05-18 MED ORDER — LEVETIRACETAM 500 MG PO TABS: 500.0000 mg | ORAL_TABLET | Freq: Two times a day (BID) | ORAL | 0 refills | Status: AC

## 2017-05-18 NOTE — Discharge Summary (Signed)
Physician Discharge Summary  [name]:[id number] DOB: 05-05-1951 DOA: 05/13/2017  PCP: Arnetha Courser, MD  Admit date: 05/13/2017 Discharge date: 05/18/2017  Admitted From: Home Disposition: Home  Recommendations for Outpatient Follow-up:  1. Follow up with PCP in 1 week 2. Please obtain CBC in 2 days 3. Follow-up with neurosurgery in 4 weeks 4. Follow-up with hematology/oncology in 1 week 5. Platelets goal is greater than 50,000 6. Please follow up on the following pending results: None  Home Health: Physical therapy, occupational therapy, nursing. Patient is declining SNF placement which was recommended by physical therapy and occupational therapy Equipment/Devices: Rolling walker  Discharge Condition: Guarded CODE STATUS: Full code Diet recommendation: Heart healthy   Brief/Interim Summary:  Admission HPI written by Reubin Milan, MD   Chief Complaint: Leg numbness and weakness and CP  HPI: [name] is a 66 y.o. female with medical history significant of CVA, permanent tracheostomy, hypertension, GERD, C KD, chronic hypokalemia, CHF, metastatic breast cancer, atrial fibrillation with pacemaker placement,, brain aneurysm.   Level V caveat applies to history obtained by patient due to patient being fatigued and wanting to sleep and not be bothered. Patient not very engaging and overall exam at time of admission and appears to be a poor historian with more generalities then specifics with regards to her current physical complaints.  Patient presenting with acute onset bilateral lower extremity numbness and weakness. Denies saddle anesthesia or loss of bowel or bladder function or lumbar back pain. Symptoms are constant but slowly improving. Patient states that she did not attempt to walk but simply hit her life alert button and came to the hospital for evaluation and workup. She's never had this problem before. Is not tried anything to alleviate her  symptoms.  Patient states she's had headache off and on but appears to be unrelated to current symptoms as she has these chronically. At time of my exam pt denies any facial numbness which is different from what pt reported to EDP.   Patient also endorses a few intermittent episodes of chest pain. Short lived. These start to occur after realizing her legs were numb. Denies any associated palpitations, LE swelling, orthopnea, radiation of pain to her neck and jaw or left shoulder. No associated diaphoresis or nausea or shortness of breath.  ED Course: objective findings outlined below. Started on vanc and cef. plt transfusion begun and neurosurgery consulted.    Hospital course:  Subdural hematoma Mental status improved and neurological deficits also improvedFrom presentation. Patient not a candidate for MRI secondary to pacemaker. Repeat CT head significant for stable hematoma. Likely secondary to thrombocytopenia. Patient has required multiple platelet transfusions. Recommendation for 7 days of Keppra. Patient completed 5-1/2 days prior to discharge. She'll be discharged with Keppra to continue her course. Outpatient follow-up with neurosurgery.  Lower extremity numbness/weakness CT scan significant for multiple levels of arthritis in addition to moderate-to-severe canal and bilateral subarticular stenosis and L3-4 region. Patient also has widespread osseous metastatic disease seen throughout the osseous structures. Neurosurgery were evaluated him injury and states that MRI or CT myelogram could be performed but would not change patient course as they would not recommend surgery.  Thrombocytopenia Platelets have been as low as undetectable during this admission. Patient has received a total of 7 units of platelets. Unknown etiology but likely contributing to subdural hematoma. Dr. Lindi Adie recommending to transfuse for platelets less than 50 and has set up for patient to have home health blood  checks every  Monday Wednesday and Friday with plan for recurrent transfusions in addition to likely bone marrow biopsy. Discussed with Dr. Lindi Adie on day of discharge for updated plan of care.  Anemia Stable. Transfused 1 unit overall. Outpatient follow-up with hematology.  Permanent tracheostomy Continued trach care.  Chronic atrial fibrillation Rate controlled. Status post pacemaker. Patient on Coumadin as an outpatient but held secondary to subdural hematoma.  Essential hypertension Slightly uncontrolled. Patient managed with hydralazine and restarted home losartan. Discharged back on home losartan of 100 mg. Hydrochlorothiazide discontinued on discharge from a need to be started depending on how blood pressure response.  Right breast cancer, metastatic Patient follows up outpatient with Dr. Lindi Adie. Patient with diffuse bony metastasis. Continue Femara. Ibrance discontinued per oncology recommendations.  Chronic pain syndrome Continued oxycodone.  Diabetes mellitus type 2 Sliding-scale insulin while inpatient. Diet-controlled as an outpatient.  Peripheral neuropathy Deceased to Neurontin 300 mg twice daily secondary to renal function.  CKD stage III Baseline.  Discharge Diagnoses:  Active Problems:   Artificial cardiac pacemaker   Tracheostomy status ([name])   Chronic pain due to injury   Atrial fibrillation, chronic ([name])   Chronic kidney disease (CKD), stage III (moderate)   Breast cancer of upper-inner quadrant of right female breast ([place])   Anemia due to antineoplastic chemotherapy   Acute subdural hematoma ([name])   Thrombocytopenia ([name])   Bilateral leg numbness    Discharge Instructions   Allergies as of 05/18/2017      Reactions   Lisinopril Cough   Eggs Or Egg-derived Products Rash   Raw eggs ONLY (boiled, scrambled, etc)  If baked into something it is NOT a issue      Medication List    STOP taking these medications   hydrochlorothiazide 12.5 MG  tablet Commonly known as:  HYDRODIURIL   palbociclib 125 MG capsule Commonly known as:  IBRANCE   predniSONE 10 MG tablet Commonly known as:  DELTASONE   promethazine 12.5 MG tablet Commonly known as:  PHENERGAN   warfarin 4 MG tablet Commonly known as:  COUMADIN     TAKE these medications   cholecalciferol 1000 units tablet Commonly known as:  VITAMIN D Take 1,000 Units by mouth daily.   gabapentin 300 MG capsule Commonly known as:  NEURONTIN Take 1 capsule (300 mg total) by mouth 2 (two) times daily. What changed:  when to take this   latanoprost 0.005 % ophthalmic solution Commonly known as:  XALATAN Place 1 drop into both eyes at bedtime.   letrozole 2.5 MG tablet Commonly known as:  FEMARA Take 1 tablet (2.5 mg total) by mouth daily.   levETIRAcetam 500 MG tablet Commonly known as:  KEPPRA Take 1 tablet (500 mg total) by mouth 2 (two) times daily.   lidocaine-prilocaine cream Commonly known as:  EMLA Apply to affected area once What changed:  how much to take  how to take this  when to take this  reasons to take this  additional instructions   losartan 100 MG tablet Commonly known as:  COZAAR Take 1 tablet (100 mg total) by mouth daily. What changed:  when to take this   ondansetron 8 MG tablet Commonly known as:  ZOFRAN Take 1 tablet (8 mg total) by mouth 2 (two) times daily as needed for refractory nausea / vomiting. Start on day 3 after chemo.   oxyCODONE 5 MG immediate release tablet Commonly known as:  Oxy IR/ROXICODONE Take 1-2 tablets (5-10 mg total) by mouth every 4 (four) hours  as needed for moderate pain.   pantoprazole 40 MG tablet Commonly known as:  PROTONIX TAKE 1 TABLET BY MOUTH EVERY DAY   potassium chloride 10 MEQ tablet Commonly known as:  KLOR-CON M10 Take 1 tablet (10 mEq total) by mouth 3 (three) times daily.   prochlorperazine 10 MG tablet Commonly known as:  COMPAZINE Take 1 tablet (10 mg total) by mouth every 6  (six) hours as needed for nausea or vomiting.   tizanidine 2 MG capsule Commonly known as:  ZANAFLEX Take 1-2 capsules (2-4 mg total) by mouth every 8 (eight) hours as needed for muscle spasms.   triamcinolone cream 0.1 % Commonly known as:  KENALOG Apply 1 application topically 2 (two) times daily. If needed       Allergies  Allergen Reactions  . Lisinopril Cough  . Eggs Or Egg-Derived Products Rash    Raw eggs ONLY (boiled, scrambled, etc)  If baked into something it is NOT a issue    Consultations:  Neurosurgery   Procedures/Studies: Ct Abdomen Pelvis Wo Contrast  Result Date: 05/13/2017 CLINICAL DATA:  Initial evaluation for acute right-sided chest pain, extending into right and left sites of face with numbness and tingling in right leg. EXAM: CT CHEST, ABDOMEN AND PELVIS WITHOUT CONTRAST CT OF THE THORACIC AND LUMBAR SPINE WITHOUT CONTRAST. TECHNIQUE: Multidetector CT imaging of the chest, abdomen and pelvis as well as the thoracic and lumbar spine was performed following the standard protocol without IV contrast. COMPARISON:  Prior radiograph from earlier same day. FINDINGS: CT CHEST FINDINGS Cardiovascular: Intrathoracic aorta of normal caliber. Scattered atheromatous plaque within the aortic arch. Partially visualized great vessels within normal limits. Heart size within normal limits. No pericardial effusion. Left-sided pacemaker/ AICD in place. Right-sided Port-A-Cath in place as well. Mediastinum/Nodes: Thyroid normal. No pathologically enlarged mediastinal, hilar, or axillary lymph nodes identified. Sequelae of prior right axillary dissection noted. Esophagus within normal limits. Lungs/Pleura: Tracheostomy tube in place within the upper airway. Tracheobronchial tree patent. Hazy atelectatic changes present dependently within the lower lobes bilaterally. No focal infiltrates. No pulmonary edema or pleural effusion. No pneumothorax. No worrisome pulmonary nodule or mass.  Musculoskeletal: Sequelae of prior mastectomy noted. Scattered lytic and sclerotic changes within the visualized spine, likely related osseous metastases. No acute osseous abnormality. Cervical ACDF noted. CT ABDOMEN PELVIS FINDINGS Hepatobiliary: Limited noncontrast evaluation liver unremarkable. Gallbladder within normal limits. No biliary dilatation. Pancreas: Pancreas within normal limits. Spleen: Spleen within normal limits. Adrenals/Urinary Tract: 2.5 cm intermediate density right adrenal nodule, indeterminate. Adrenal glands otherwise unremarkable. Right kidneys somewhat atrophic as compared to the left. Punctate 2 mm nonobstructive stone present within the lower pole the right kidney. No other radiopaque calculi. No hydronephrosis or hydroureter. Bladder within normal limits. Stomach/Bowel: Stomach within normal limits. No evidence for bowel obstruction. Mild colonic diverticulosis without evidence for acute diverticulitis. No acute inflammatory changes seen about the bowels. No findings to suggest acute appendicitis. 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 No aneurysm. No adenopathy. Flattening of the IVC, suggesting dehydration/ hypovolemia. Reproductive: Uterus is absent. Left ovary within normal limits. Right ovary not discretely identified. Other: No free air or fluid. Diastasis of the rectus abdominis musculature with small fat containing paraumbilical hernia. Musculoskeletal: Diffuse sclerosis with lucent reasons throughout the visualized osseous structures, likely related to osseous metastases. No acute osseous abnormality. CT THORACIC SPINE: Mild dextroscoliosis. Vertebral bodies otherwise normally aligned with preservation of the normal thoracic kyphosis. No listhesis. Cervical ACDF noted. Diffuse abnormal appearance of the  visualized osseous structures, likely related to diffuse osseous metastases. No associated pathologic fracture. No appreciable extraosseous  tumor or significant epidural tumor. Paraspinous soft tissues within normal limits. Multilevel degenerative endplate spurring seen throughout the thoracic spine, greatest within the midthoracic spine. Scattered posterior endplate marginal osteophytic spurring noted as well. No significant canal stenosis. Mild to moderate bilateral foraminal narrowing noted within the lower thoracic spine at T10-11, T11-12, and T12-L1. CT LUMBAR SPINE: Normal segmentation. Lowest well-formed disc labeled the L5-S1 level. 4 mm anterolisthesis of L3 on L4 and 2 mm anterolisthesis of L4 on L5. Vertebral bodies otherwise normally aligned with preservation of the normal lumbar lordosis. Widespread osseous metastatic disease seen throughout the visualized osseous structures. No pathologic fracture. No significant extra osseous extension of tumor identified. Paraspinous soft tissues demonstrate no acute abnormality. Visualized sacrum intact. L1-2: Mild disc bulge with facet hypertrophy. No canal stenosis. Mild bilateral L1 foraminal stenosis. L2-3: Diffuse disc bulge with moderate facet hypertrophy. Mild canal stenosis. Mild bilateral L2 foraminal narrowing. L3-4: 4 mm anterolisthesis. Diffuse disc bulge with disc desiccation. Advanced facet arthrosis. Moderate to severe canal and bilateral subarticular stenosis. Moderate to severe bilateral L3 foraminal narrowing, left worse than right. L4-5: 2 mm anterolisthesis of L4 on L5. Associated diffuse disc bulge. Advanced facet arthrosis. Patient status post prior decompression at this level. No residual canal stenosis. Foramina appear patent. L5-S1: L5-S1 interspace largely fused in ankylosed. Prior posterior decompression. Right greater than left facet arthrosis. No canal stenosis. Mild bilateral L5 foraminal narrowing. IMPRESSION: CT CHEST, ABDOMEN, AND PELVIS IMPRESSION: 1. No acute abnormality identified within the chest, abdomen, and pelvis. 2. Diffuse abnormal appearance of the visualized  osseous structures, consistent with diffuse osseous metastases. 3. Chronic right renal atrophy with associated 2 mm nonobstructive nephrolithiasis. 4. Mild colonic diverticulosis without evidence for acute diverticulitis. 5. 2.5 cm right adrenal nodule, indeterminate. Follow-up exam with dedicated adrenal mass protocol CT and/or MRI suggested for full characterization. 6. Sequelae of prior mastectomy with right axillary nodal dissection. 7. Tracheostomy tube in place in the upper airway. CT THORACIC AND LUMBAR SPINE IMPRESSION: 1. No acute abnormality within the thoracic and lumbar spine. 2. Diffuse osseous metastatic disease. No associated pathologic fracture. No appreciable extra osseous or epidural tumor identified on this noncontrast examination. 3. Multilevel degenerative spondylolysis as detailed above, greatest within the lower lumbar spine at the L3-4 level. Please see above report for a full description of these findings. Electronically Signed   By: Jeannine Boga M.D.   On: 05/13/2017 05:00   Ct Head Wo Contrast  Result Date: 05/13/2017 CLINICAL DATA:  66 y/o  F; subdural hematoma. EXAM: CT HEAD WITHOUT CONTRAST TECHNIQUE: Contiguous axial images were obtained from the base of the skull through the vertex without intravenous contrast. COMPARISON:  05/13/2017 CT head FINDINGS: Brain: Stable thin subdural hematoma along the anterior falx. Stable tiny acute subdural hematoma over the left parasagittal frontal convexity. Stable thin chronic subdural hematoma over the left parietal convexity. No significant mass effect on the brain. No evidence for new acute intracranial hemorrhage, acute infarct, mass effect, herniation, or hydrocephalus. Cavum septum pellucidum et vergae. Vascular: No hyperdense vessel. Mild calcific atherosclerosis of carotid siphons. Skull: Normal. Negative for fracture or focal lesion. Sinuses/Orbits: No acute finding. Other: Right parietal scalp 14 mm dermal lesion, direct  visualization is recommended. Torus palatini. IMPRESSION: 1. Stable thin acute subdural hematoma along the anterior falx and over superior left paramedian frontal lobe. 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 No significant  mass effect. 2. No new acute intracranial abnormality. 3. Right high parietal scalp 14 mm dermal lesion. Direct visualization recommended. Electronically Signed   By: Kristine Garbe M.D.   On: 05/13/2017 22:18   Ct Head Wo Contrast  Result Date: 05/13/2017 CLINICAL DATA:  Initial evaluation for acute numbness, tingling, paresthesias. EXAM: CT HEAD WITHOUT CONTRAST TECHNIQUE: Contiguous axial images were obtained from the base of the skull through the vertex without intravenous contrast. COMPARISON:  None available. FINDINGS: Brain: Cerebral volume within normal limits. There is an acute subdural hematoma seen at the anterior inferior falx measuring up to 3 mm in maximal thickness. Additional patchy small volume extra-axial hemorrhage overlies the anterior parasagittal left frontal convexity (series 3, image 23). No significant mass effect. No made of an additional subacute to chronic appearing subdural collection overlying the left parietal convexity measuring up to 5 mm in maximal thickness. No midline shift. No hydrocephalus or ventricular trapping. Basilar cisterns remain patent. No other acute intracranial hemorrhage. No acute large vessel territory infarct. No mass lesion. Vascular: No hyperdense vessel. Scattered vascular calcifications noted within the carotid siphons. Skull: Scalp soft tissues within normal limits.  Calvarium intact. Sinuses/Orbits: Globes and orbital soft tissues within normal limits. Scattered mucosal thickening within the sphenoid sinuses. Paranasal sinuses are otherwise clear. No mastoid effusion. IMPRESSION: 1. Acute small volume subdural hemorrhage positioned along the anterior inferior falx and overlying the left anterior  cerebral convexity. No significant mass effect. 2. Additional subacute to chronic left subdural collection overlying the left parietal convexity without significant mass effect. Critical Value/emergent results were called by telephone at the time of interpretation on 05/13/2017 at 5:09 am to Dr. Ripley Fraise , who verbally acknowledged these results. Electronically Signed   By: Jeannine Boga M.D.   On: 05/13/2017 05:14   Ct Chest Wo Contrast  Result Date: 05/13/2017 CLINICAL DATA:  Initial evaluation for acute right-sided chest pain, extending into right and left sites of face with numbness and tingling in right leg. EXAM: CT CHEST, ABDOMEN AND PELVIS WITHOUT CONTRAST CT OF THE THORACIC AND LUMBAR SPINE WITHOUT CONTRAST. TECHNIQUE: Multidetector CT imaging of the chest, abdomen and pelvis as well as the thoracic and lumbar spine was performed following the standard protocol without IV contrast. COMPARISON:  Prior radiograph from earlier same day. FINDINGS: CT CHEST FINDINGS Cardiovascular: Intrathoracic aorta of normal caliber. Scattered atheromatous plaque within the aortic arch. Partially visualized great vessels within normal limits. Heart size within normal limits. No pericardial effusion. Left-sided pacemaker/ AICD in place. Right-sided Port-A-Cath in place as well. Mediastinum/Nodes: Thyroid normal. No pathologically enlarged mediastinal, hilar, or axillary lymph nodes identified. Sequelae of prior right axillary dissection noted. Esophagus within normal limits. Lungs/Pleura: Tracheostomy tube in place within the upper airway. Tracheobronchial tree patent. Hazy atelectatic changes present dependently within the lower lobes bilaterally. No focal infiltrates. No pulmonary edema or pleural effusion. No pneumothorax. No worrisome pulmonary nodule or mass. Musculoskeletal: Sequelae of prior mastectomy noted. Scattered lytic and sclerotic changes within the visualized spine, likely related osseous  metastases. No acute osseous abnormality. Cervical ACDF noted. CT ABDOMEN PELVIS FINDINGS Hepatobiliary: Limited noncontrast evaluation liver unremarkable. Gallbladder within normal limits. No biliary dilatation. Pancreas: Pancreas within normal limits. Spleen: Spleen within normal limits. Adrenals/Urinary Tract: 2.5 cm intermediate density right adrenal nodule, indeterminate. Adrenal glands otherwise unremarkable. Right kidneys somewhat atrophic as compared to the left. Punctate 2 mm nonobstructive stone present within the lower pole the right kidney. No other radiopaque calculi. No hydronephrosis or hydroureter. Bladder  within normal limits. Stomach/Bowel: Stomach within normal limits. No evidence for bowel obstruction. Mild colonic diverticulosis without evidence for acute diverticulitis. No acute inflammatory changes seen about the bowels. No findings to suggest acute appendicitis. 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 No aneurysm. No adenopathy. Flattening of the IVC, suggesting dehydration/ hypovolemia. Reproductive: Uterus is absent. Left ovary within normal limits. Right ovary not discretely identified. Other: No free air or fluid. Diastasis of the rectus abdominis musculature with small fat containing paraumbilical hernia. Musculoskeletal: Diffuse sclerosis with lucent reasons throughout the visualized osseous structures, likely related to osseous metastases. No acute osseous abnormality. CT THORACIC SPINE: Mild dextroscoliosis. Vertebral bodies otherwise normally aligned with preservation of the normal thoracic kyphosis. No listhesis. Cervical ACDF noted. Diffuse abnormal appearance of the visualized osseous structures, likely related to diffuse osseous metastases. No associated pathologic fracture. No appreciable extraosseous tumor or significant epidural tumor. Paraspinous soft tissues within normal limits. Multilevel degenerative endplate spurring seen throughout  the thoracic spine, greatest within the midthoracic spine. Scattered posterior endplate marginal osteophytic spurring noted as well. No significant canal stenosis. Mild to moderate bilateral foraminal narrowing noted within the lower thoracic spine at T10-11, T11-12, and T12-L1. CT LUMBAR SPINE: Normal segmentation. Lowest well-formed disc labeled the L5-S1 level. 4 mm anterolisthesis of L3 on L4 and 2 mm anterolisthesis of L4 on L5. Vertebral bodies otherwise normally aligned with preservation of the normal lumbar lordosis. Widespread osseous metastatic disease seen throughout the visualized osseous structures. No pathologic fracture. No significant extra osseous extension of tumor identified. Paraspinous soft tissues demonstrate no acute abnormality. Visualized sacrum intact. L1-2: Mild disc bulge with facet hypertrophy. No canal stenosis. Mild bilateral L1 foraminal stenosis. L2-3: Diffuse disc bulge with moderate facet hypertrophy. Mild canal stenosis. Mild bilateral L2 foraminal narrowing. L3-4: 4 mm anterolisthesis. Diffuse disc bulge with disc desiccation. Advanced facet arthrosis. Moderate to severe canal and bilateral subarticular stenosis. Moderate to severe bilateral L3 foraminal narrowing, left worse than right. L4-5: 2 mm anterolisthesis of L4 on L5. Associated diffuse disc bulge. Advanced facet arthrosis. Patient status post prior decompression at this level. No residual canal stenosis. Foramina appear patent. L5-S1: L5-S1 interspace largely fused in ankylosed. Prior posterior decompression. Right greater than left facet arthrosis. No canal stenosis. Mild bilateral L5 foraminal narrowing. IMPRESSION: CT CHEST, ABDOMEN, AND PELVIS IMPRESSION: 1. No acute abnormality identified within the chest, abdomen, and pelvis. 2. Diffuse abnormal appearance of the visualized osseous structures, consistent with diffuse osseous metastases. 3. Chronic right renal atrophy with associated 2 mm nonobstructive  nephrolithiasis. 4. Mild colonic diverticulosis without evidence for acute diverticulitis. 5. 2.5 cm right adrenal nodule, indeterminate. Follow-up exam with dedicated adrenal mass protocol CT and/or MRI suggested for full characterization. 6. Sequelae of prior mastectomy with right axillary nodal dissection. 7. Tracheostomy tube in place in the upper airway. CT THORACIC AND LUMBAR SPINE IMPRESSION: 1. No acute abnormality within the thoracic and lumbar spine. 2. Diffuse osseous metastatic disease. No associated pathologic fracture. No appreciable extra osseous or epidural tumor identified on this noncontrast examination. 3. Multilevel degenerative spondylolysis as detailed above, greatest within the lower lumbar spine at the L3-4 level. Please see above report for a full description of these findings. Electronically Signed   By: Jeannine Boga M.D.   On: 05/13/2017 05:00   Ct T-spine No Charge  Result Date: 05/13/2017 CLINICAL DATA:  Initial evaluation for acute right-sided chest pain, extending into right and left sites of face with numbness and tingling in right leg. EXAM:  CT CHEST, ABDOMEN AND PELVIS WITHOUT CONTRAST CT OF THE THORACIC AND LUMBAR SPINE WITHOUT CONTRAST. TECHNIQUE: Multidetector CT imaging of the chest, abdomen and pelvis as well as the thoracic and lumbar spine was performed following the standard protocol without IV contrast. COMPARISON:  Prior radiograph from earlier same day. FINDINGS: CT CHEST FINDINGS Cardiovascular: Intrathoracic aorta of normal caliber. Scattered atheromatous plaque within the aortic arch. Partially visualized great vessels within normal limits. Heart size within normal limits. No pericardial effusion. Left-sided pacemaker/ AICD in place. Right-sided Port-A-Cath in place as well. Mediastinum/Nodes: Thyroid normal. No pathologically enlarged mediastinal, hilar, or axillary lymph nodes identified. Sequelae of prior right axillary dissection noted. Esophagus within  normal limits. Lungs/Pleura: Tracheostomy tube in place within the upper airway. Tracheobronchial tree patent. Hazy atelectatic changes present dependently within the lower lobes bilaterally. No focal infiltrates. No pulmonary edema or pleural effusion. No pneumothorax. No worrisome pulmonary nodule or mass. Musculoskeletal: Sequelae of prior mastectomy noted. Scattered lytic and sclerotic changes within the visualized spine, likely related osseous metastases. No acute osseous abnormality. Cervical ACDF noted. CT ABDOMEN PELVIS FINDINGS Hepatobiliary: Limited noncontrast evaluation liver unremarkable. Gallbladder within normal limits. No biliary dilatation. Pancreas: Pancreas within normal limits. Spleen: Spleen within normal limits. Adrenals/Urinary Tract: 2.5 cm intermediate density right adrenal nodule, indeterminate. Adrenal glands otherwise unremarkable. Right kidneys somewhat atrophic as compared to the left. Punctate 2 mm nonobstructive stone present within the lower pole the right kidney. No other radiopaque calculi. No hydronephrosis or hydroureter. Bladder within normal limits. Stomach/Bowel: Stomach within normal limits. No evidence for bowel obstruction. Mild colonic diverticulosis without evidence for acute diverticulitis. No acute inflammatory changes seen about the bowels. No findings to suggest acute appendicitis. 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 No aneurysm. No adenopathy. Flattening of the IVC, suggesting dehydration/ hypovolemia. Reproductive: Uterus is absent. Left ovary within normal limits. Right ovary not discretely identified. Other: No free air or fluid. Diastasis of the rectus abdominis musculature with small fat containing paraumbilical hernia. Musculoskeletal: Diffuse sclerosis with lucent reasons throughout the visualized osseous structures, likely related to osseous metastases. No acute osseous abnormality. CT THORACIC SPINE: Mild  dextroscoliosis. Vertebral bodies otherwise normally aligned with preservation of the normal thoracic kyphosis. No listhesis. Cervical ACDF noted. Diffuse abnormal appearance of the visualized osseous structures, likely related to diffuse osseous metastases. No associated pathologic fracture. No appreciable extraosseous tumor or significant epidural tumor. Paraspinous soft tissues within normal limits. Multilevel degenerative endplate spurring seen throughout the thoracic spine, greatest within the midthoracic spine. Scattered posterior endplate marginal osteophytic spurring noted as well. No significant canal stenosis. Mild to moderate bilateral foraminal narrowing noted within the lower thoracic spine at T10-11, T11-12, and T12-L1. CT LUMBAR SPINE: Normal segmentation. Lowest well-formed disc labeled the L5-S1 level. 4 mm anterolisthesis of L3 on L4 and 2 mm anterolisthesis of L4 on L5. Vertebral bodies otherwise normally aligned with preservation of the normal lumbar lordosis. Widespread osseous metastatic disease seen throughout the visualized osseous structures. No pathologic fracture. No significant extra osseous extension of tumor identified. Paraspinous soft tissues demonstrate no acute abnormality. Visualized sacrum intact. L1-2: Mild disc bulge with facet hypertrophy. No canal stenosis. Mild bilateral L1 foraminal stenosis. L2-3: Diffuse disc bulge with moderate facet hypertrophy. Mild canal stenosis. Mild bilateral L2 foraminal narrowing. L3-4: 4 mm anterolisthesis. Diffuse disc bulge with disc desiccation. Advanced facet arthrosis. Moderate to severe canal and bilateral subarticular stenosis. Moderate to severe bilateral L3 foraminal narrowing, left worse than right. L4-5: 2 mm anterolisthesis of L4  on L5. Associated diffuse disc bulge. Advanced facet arthrosis. Patient status post prior decompression at this level. No residual canal stenosis. Foramina appear patent. L5-S1: L5-S1 interspace largely fused  in ankylosed. Prior posterior decompression. Right greater than left facet arthrosis. No canal stenosis. Mild bilateral L5 foraminal narrowing. IMPRESSION: CT CHEST, ABDOMEN, AND PELVIS IMPRESSION: 1. No acute abnormality identified within the chest, abdomen, and pelvis. 2. Diffuse abnormal appearance of the visualized osseous structures, consistent with diffuse osseous metastases. 3. Chronic right renal atrophy with associated 2 mm nonobstructive nephrolithiasis. 4. Mild colonic diverticulosis without evidence for acute diverticulitis. 5. 2.5 cm right adrenal nodule, indeterminate. Follow-up exam with dedicated adrenal mass protocol CT and/or MRI suggested for full characterization. 6. Sequelae of prior mastectomy with right axillary nodal dissection. 7. Tracheostomy tube in place in the upper airway. CT THORACIC AND LUMBAR SPINE IMPRESSION: 1. No acute abnormality within the thoracic and lumbar spine. 2. Diffuse osseous metastatic disease. No associated pathologic fracture. No appreciable extra osseous or epidural tumor identified on this noncontrast examination. 3. Multilevel degenerative spondylolysis as detailed above, greatest within the lower lumbar spine at the L3-4 level. Please see above report for a full description of these findings. Electronically Signed   By: Jeannine Boga M.D.   On: 05/13/2017 05:00   Ct L-spine No Charge  Result Date: 05/13/2017 CLINICAL DATA:  Initial evaluation for acute right-sided chest pain, extending into right and left sites of face with numbness and tingling in right leg. EXAM: CT CHEST, ABDOMEN AND PELVIS WITHOUT CONTRAST CT OF THE THORACIC AND LUMBAR SPINE WITHOUT CONTRAST. TECHNIQUE: Multidetector CT imaging of the chest, abdomen and pelvis as well as the thoracic and lumbar spine was performed following the standard protocol without IV contrast. COMPARISON:  Prior radiograph from earlier same day. FINDINGS: CT CHEST FINDINGS Cardiovascular: Intrathoracic aorta  of normal caliber. Scattered atheromatous plaque within the aortic arch. Partially visualized great vessels within normal limits. Heart size within normal limits. No pericardial effusion. Left-sided pacemaker/ AICD in place. Right-sided Port-A-Cath in place as well. Mediastinum/Nodes: Thyroid normal. No pathologically enlarged mediastinal, hilar, or axillary lymph nodes identified. Sequelae of prior right axillary dissection noted. Esophagus within normal limits. Lungs/Pleura: Tracheostomy tube in place within the upper airway. Tracheobronchial tree patent. Hazy atelectatic changes present dependently within the lower lobes bilaterally. No focal infiltrates. No pulmonary edema or pleural effusion. No pneumothorax. No worrisome pulmonary nodule or mass. Musculoskeletal: Sequelae of prior mastectomy noted. Scattered lytic and sclerotic changes within the visualized spine, likely related osseous metastases. No acute osseous abnormality. Cervical ACDF noted. CT ABDOMEN PELVIS FINDINGS Hepatobiliary: Limited noncontrast evaluation liver unremarkable. Gallbladder within normal limits. No biliary dilatation. Pancreas: Pancreas within normal limits. Spleen: Spleen within normal limits. Adrenals/Urinary Tract: 2.5 cm intermediate density right adrenal nodule, indeterminate. Adrenal glands otherwise unremarkable. Right kidneys somewhat atrophic as compared to the left. Punctate 2 mm nonobstructive stone present within the lower pole the right kidney. No other radiopaque calculi. No hydronephrosis or hydroureter. Bladder within normal limits. Stomach/Bowel: Stomach within normal limits. No evidence for bowel obstruction. Mild colonic diverticulosis without evidence for acute diverticulitis. No acute inflammatory changes seen about the bowels. No findings to suggest acute appendicitis. 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 No aneurysm. No adenopathy. Flattening of the IVC, suggesting  dehydration/ hypovolemia. Reproductive: Uterus is absent. Left ovary within normal limits. Right ovary not discretely identified. Other: No free air or fluid. Diastasis of the rectus abdominis musculature with small fat containing paraumbilical hernia. Musculoskeletal:  Diffuse sclerosis with lucent reasons throughout the visualized osseous structures, likely related to osseous metastases. No acute osseous abnormality. CT THORACIC SPINE: Mild dextroscoliosis. Vertebral bodies otherwise normally aligned with preservation of the normal thoracic kyphosis. No listhesis. Cervical ACDF noted. Diffuse abnormal appearance of the visualized osseous structures, likely related to diffuse osseous metastases. No associated pathologic fracture. No appreciable extraosseous tumor or significant epidural tumor. Paraspinous soft tissues within normal limits. Multilevel degenerative endplate spurring seen throughout the thoracic spine, greatest within the midthoracic spine. Scattered posterior endplate marginal osteophytic spurring noted as well. No significant canal stenosis. Mild to moderate bilateral foraminal narrowing noted within the lower thoracic spine at T10-11, T11-12, and T12-L1. CT LUMBAR SPINE: Normal segmentation. Lowest well-formed disc labeled the L5-S1 level. 4 mm anterolisthesis of L3 on L4 and 2 mm anterolisthesis of L4 on L5. Vertebral bodies otherwise normally aligned with preservation of the normal lumbar lordosis. Widespread osseous metastatic disease seen throughout the visualized osseous structures. No pathologic fracture. No significant extra osseous extension of tumor identified. Paraspinous soft tissues demonstrate no acute abnormality. Visualized sacrum intact. L1-2: Mild disc bulge with facet hypertrophy. No canal stenosis. Mild bilateral L1 foraminal stenosis. L2-3: Diffuse disc bulge with moderate facet hypertrophy. Mild canal stenosis. Mild bilateral L2 foraminal narrowing. L3-4: 4 mm anterolisthesis.  Diffuse disc bulge with disc desiccation. Advanced facet arthrosis. Moderate to severe canal and bilateral subarticular stenosis. Moderate to severe bilateral L3 foraminal narrowing, left worse than right. L4-5: 2 mm anterolisthesis of L4 on L5. Associated diffuse disc bulge. Advanced facet arthrosis. Patient status post prior decompression at this level. No residual canal stenosis. Foramina appear patent. L5-S1: L5-S1 interspace largely fused in ankylosed. Prior posterior decompression. Right greater than left facet arthrosis. No canal stenosis. Mild bilateral L5 foraminal narrowing. IMPRESSION: CT CHEST, ABDOMEN, AND PELVIS IMPRESSION: 1. No acute abnormality identified within the chest, abdomen, and pelvis. 2. Diffuse abnormal appearance of the visualized osseous structures, consistent with diffuse osseous metastases. 3. Chronic right renal atrophy with associated 2 mm nonobstructive nephrolithiasis. 4. Mild colonic diverticulosis without evidence for acute diverticulitis. 5. 2.5 cm right adrenal nodule, indeterminate. Follow-up exam with dedicated adrenal mass protocol CT and/or MRI suggested for full characterization. 6. Sequelae of prior mastectomy with right axillary nodal dissection. 7. Tracheostomy tube in place in the upper airway. CT THORACIC AND LUMBAR SPINE IMPRESSION: 1. No acute abnormality within the thoracic and lumbar spine. 2. Diffuse osseous metastatic disease. No associated pathologic fracture. No appreciable extra osseous or epidural tumor identified on this noncontrast examination. 3. Multilevel degenerative spondylolysis as detailed above, greatest within the lower lumbar spine at the L3-4 level. Please see above report for a full description of these findings. Electronically Signed   By: Jeannine Boga M.D.   On: 05/13/2017 05:00   Dg Chest Portable 1 View  Result Date: 05/13/2017 CLINICAL DATA:  Acute onset of generalized chest pain. Initial encounter. EXAM: PORTABLE CHEST 1 VIEW  COMPARISON:  PET/CT performed 01/14/2017, and chest radiograph performed 07/18/2016 FINDINGS: The lungs are well-aerated. Mild bibasilar airspace opacities may reflect atelectasis or possibly mild pneumonia. There is no evidence of pleural effusion or pneumothorax. The cardiomediastinal silhouette is borderline normal in size. A pacemaker is noted overlying the left chest wall, with leads ending at the right atrium and over the right ventricle. No acute osseous abnormalities are seen. The patient's tracheostomy tube is seen ending 7 cm above the carina. Cervical spinal fusion hardware is noted. IMPRESSION: Mild bibasilar airspace opacities may reflect atelectasis  or possibly mild pneumonia. Electronically Signed   By: Garald Balding M.D.   On: 05/13/2017 02:13      Subjective: She reports no headache or dizziness. No nausea or vomiting. Was ambulating well yesterday.  Discharge Exam: Vitals:   05/18/17 1235 05/18/17 1430  BP: (!) 144/79 136/74  Pulse: 92 90  Resp: 16   Temp: 98.6 F (37 C) 98.6 F (37 C)  SpO2: 96% 96%   Vitals:   05/18/17 0916 05/18/17 1204 05/18/17 1235 05/18/17 1430  BP: 129/65 (!) 148/93 (!) 144/79 136/74  Pulse: 87 90 92 90  Resp: _0 Temp: 99.1 F (37.3 C) 99 F (37.2 C) 98.6 F (37 C) 98.6 F (37 C)  TempSrc: Oral Oral Oral Oral  SpO2: 91% 93% 96% 96%  Weight:      Height:        General: Pt is alert, awake, not in acute distress Cardiovascular: RRR, S1/S2 +, no rubs, no gallops Respiratory: CTA bilaterally, no wheezing, no rhonchi. Trach collar in place. Abdominal: Soft, NT, ND, bowel sounds + Extremities: no edema, no cyanosis    The results of significant diagnostics from this hospitalization (including imaging, microbiology, ancillary and laboratory) are listed below for reference.     Microbiology: Recent Results (from the past 240 hour(s))  MRSA PCR Screening     Status: None   Collection Time: 05/13/17  9:25 AM  Result Value Ref  Range Status   MRSA by PCR NEGATIVE NEGATIVE Final    Comment:        The GeneXpert MRSA Assay (FDA approved for NASAL specimens only), is one component of a comprehensive MRSA colonization surveillance program. It is not intended to diagnose MRSA infection nor to guide or monitor treatment for MRSA infections.      Labs: BNP (last 3 results)  Recent Labs  09/25/16 0804  BNP 470.9*   Basic Metabolic Panel:  Recent Labs Lab 05/13/17 0208 05/13/17 0227 05/14/17 0245 05/16/17 0620 05/17/17 0536 05/18/17 0442  NA 134* 136 134* 136 136 136  K 4.0 4.0 4.2 3.9 3.9 3.7  CL 104 104 104 103 103 103  CO2 20*  --  _1 21*  GLUCOSE 122* 120* 130* 99 97 122*  BUN 24* 26* 24* 20 22* 24*  CREATININE 2.02* 2.10* 1.93* 1.70* 1.69* 1.61*  CALCIUM 10.0  --  9.6 10.6* 10.7* 10.6*  MG  --   --   --   --  2.1 2.2   Liver Function Tests:  Recent Labs Lab 05/16/17 0620  AST 227*  ALT 47  ALKPHOS 125  BILITOT 1.2  PROT 6.4*  ALBUMIN 3.0*   No results for input(s): LIPASE, AMYLASE in the last 168 hours. No results for input(s): AMMONIA in the last 168 hours. CBC:  Recent Labs Lab 05/15/17 1843 05/16/17 0620 05/16/17 1730 05/17/17 0536 05/18/17 0442  WBC 7.1 6.3 6.4 6.4 6.3  NEUTROABS  --   --  3.7  --   --   HGB 8.5* 8.0* 8.1* 8.1* 7.8*  HCT 25.9* 24.8* 25.2* 24.7* 24.4*  MCV 89.6 91.2 90.0 90.8 91.0  PLT 23* 50* 65* 59* 36*   Cardiac Enzymes:  Recent Labs Lab 05/13/17 1041 05/13/17 1643  TROPONINI 0.03* <0.03   BNP: Invalid input(s): POCBNP CBG:  Recent Labs Lab 05/17/17 1150 05/17/17 1656 05/17/17 2159 05/18/17 0613 05/18/17 1204  GLUCAP 88 97 90 123* 109*   D-Dimer No results for input(s):  DDIMER in the last 72 hours. Hgb A1c No results for input(s): HGBA1C in the last 72 hours. Lipid Profile No results for input(s): CHOL, HDL, LDLCALC, TRIG, CHOLHDL, LDLDIRECT in the last 72 hours. Thyroid function studies No results for input(s):  TSH, T4TOTAL, T3FREE, THYROIDAB in the last 72 hours.  Invalid input(s): FREET3 Anemia work up No results for input(s): VITAMINB12, FOLATE, FERRITIN, TIBC, IRON, RETICCTPCT in the last 72 hours. Urinalysis    Component Value Date/Time   COLORURINE YELLOW 05/13/2017 1230   APPEARANCEUR HAZY (A) 05/13/2017 1230   LABSPEC 1.014 05/13/2017 1230   PHURINE 6.0 05/13/2017 1230   GLUCOSEU NEGATIVE 05/13/2017 1230   GLUCOSEU NEGATIVE 11/30/2013 1455   HGBUR LARGE (A) 05/13/2017 1230   BILIRUBINUR NEGATIVE 05/13/2017 1230   BILIRUBINUR neg 11/25/2013 1528   KETONESUR NEGATIVE 05/13/2017 1230   PROTEINUR 100 (A) 05/13/2017 1230   UROBILINOGEN 0.2 11/30/2013 1455   NITRITE NEGATIVE 05/13/2017 1230   LEUKOCYTESUR LARGE (A) 05/13/2017 1230   Sepsis Labs Invalid input(s): PROCALCITONIN,  WBC,  LACTICIDVEN Microbiology Recent Results (from the past 240 hour(s))  MRSA PCR Screening     Status: None   Collection Time: 05/13/17  9:25 AM  Result Value Ref Range Status   MRSA by PCR NEGATIVE NEGATIVE Final    Comment:        The GeneXpert MRSA Assay (FDA approved for NASAL specimens only), is one component of a comprehensive MRSA colonization surveillance program. It is not intended to diagnose MRSA infection nor to guide or monitor treatment for MRSA infections.      Time coordinating discharge: Over 30 minutes  SIGNED:   Cordelia Poche, MD [facility] 05/18/2017, 3:30 PM Pager [phone]  If 7PM-7AM, please contact night-coverage [email or web address] Password [email or web address]

## 2017-05-18 NOTE — Progress Notes (Signed)
RN discussed discharge instructions to patient and patient's POA. Verbalized understanding of f/u appts with pcp, oncology, and n/s. She states she will call to make appts. Verbalized understanding of medications. Given keppra prescription, understands she must take keppra tonight. Patient states she cannot and will not be able to get to pharmacy tonight, notified [facility] pharmacy, they state RN may give patient 1 keppra tab to take tonight. Tab given to patient.  Patient's POA states she is uncomfortable with patient going home, patient is alert, oriented x4, appropriate cognition, patient states she is going home. Neuro assessment unchanged.

## 2017-05-18 NOTE — Progress Notes (Signed)
Per Dr. Lonny Prude, do not need to recheck cbc before patient's discharge.

## 2017-05-18 NOTE — Discharge Instructions (Addendum)
[name],  Your admitted with a subdural hematoma in addition to altered mental status. A subdural hematoma is bleeding around the brain. This was watched and has remained stable. He also had low platelets which likely contributed to your bleeding. No goal platelets is greater than 50,000. He received multiple transfusions to keep this count high. In discussions with ear specialists, he will likely require repeat transfusions as an outpatient to keep you platelets at goal to decrease the risk of worsening your subdural hematoma. Please take your Keppra for the next day. Please return immediately if you have worsening symptoms of headache, confusion, nausea or vomiting, trouble staying awake.   Subdural Hematoma A subdural hematoma is a collection of blood between the brain and its tough outer covering (dura). As the amount of blood increases, it puts pressure on the brain. There are two types of subdural hematomas:  Acute. This type develops shortly after a hard, direct hit (blow) to the head and causes blood to collect very quickly. This is a medical emergency. If it is not diagnosed and treated quickly, it can lead to severe brain injury or death.  Chronic. This is when bleeding develops more slowly, over weeks or months.  What are the causes? This condition is caused by bleeding (hemorrhage) from a broken (ruptured) blood vessel. In most cases, a blood vessel ruptures and bleeds because of injury (trauma) to the head, such as from a hard, direct hit. Head trauma can happen in:  Traffic accidents.  Falls.  Assaults.  Sport injuries.  In rare cases, hemorrhage can happen without a known cause (spontaneously), especially if you take blood thinners (anticoagulants). What increases the risk? This condition is more likely to develop in:  Older people.  Infants.  People who take blood thinners.  People who have injured their head.  People who abuse alcohol.  What are the  signs or symptoms? Depending on the size of the hematoma, symptoms can vary from mild to severe and life-threatening. Symptoms in acute subdural hematoma can develop over minutes or hours. Symptoms in chronic subdural hematoma may develop over weeks or months.  Headaches.  Nausea or vomiting.  Changes in vision, such as double vision or loss of vision.  Changes in speech.  Loss of balance or difficulty walking.  Weakness, numbness, or tingling in the arms or legs on one side of the body.  Jerky movements that you cannot control (seizures).  Change in personality.  Increased sleepiness.  Memory loss.  Loss of consciousness.  Coma.  How is this diagnosed? This condition is diagnosed based on the results of:  A physical and neurological exam.  CT scan.  MRI.  How is this treated? Treatment for this condition depends on the severity and the type of subdural hematoma that you have. You may need to temporarily stop taking blood thinners, if this applies. You may be given antiseizure (anticonvulsant) medicine. Treatment for acute subdural hematoma may include:  Medicines that help the body get rid of excess fluids (diuretics). These may help reduce pressure in the brain.  Assisted breathing (ventilation). This involves using a machine called a ventilator to help you breathe. This helps to reduce pressure in the brain, especially if there is swelling of the brain.  Emergency surgery to drain blood or remove a blood clot.  Treatment for chronic subdural hematoma may include:  Observation and bed rest at the hospital.  Emergency surgery. This may be done if the bleeding is large, or if  you have neurological symptoms such as weakness or numbness.  Sometimes, no treatment is needed for chronic subdural hematoma. Follow these instructions at home: Activity  Avoid any situation where there is potential for another head injury, such as football, hockey, soccer, basketball,  martial arts, downhill snow sports, and horseback riding. Do not do these activities until your health care provider approves. ? If you play a contact sport and you experience a head injury, follow advice from your health care provider about when you can return to the sport. If you get another injury while you are healing, you may experience another hemorrhage.  Avoid excessive visual stimulation while recovering. This includes working on the computer, watching TV, and reading.  Try to avoid activities that cause physical or mental stress. Stay home from work or school as directed by your health care provider.  Do not drive, ride a bicycle, or use heavy machinery until your health care provider approves.  Do not lift anything that is heavier than 5 lb (2.3 kg) until your health care provider approves.  If physical therapy was prescribed, do exercises as told by your health care provider or physical therapist.  Rest as told by your health care provider. Rest helps the brain to heal.  Make sure you: ? Get plenty of sleep. Avoid staying up late at night. ? Keep a consistent sleep schedule. Try to go to sleep and wake up at about the same time every day. General instructions  Recovery from brain injuries varies widely. Talk with your health care provider about what to expect. Monitor your symptoms, and ask people around you to do the same.  Take over-the-counter and prescription medicines only as told by your health care provider. Do not take blood thinners or NSAIDs unless your health care provider approves. This includes aspirin, ibuprofen, naproxen, and warfarin.  Limit alcohol intake to no more than 1 drink per day for nonpregnant women and 2 drinks per day for men. One drink equals 12 oz of beer, 5 oz of wine, or 1 oz of hard liquor.  Keep all follow-up visits as told by your health care provider. This is important. How is this prevented?  Wear protective gear, such as helmets, when  participating in activities such as biking or contact sports.  Always wear a seat belt when you are in a motor vehicle.  Keep your home environment safe to reduce the risk of falling: ? Remove clutter and tripping hazards from floors and stairways, such as loose rugs and extension cords. ? Use grab bars in bathrooms and handrails by stairs. ? Place non-slip mats on floors and in bathtubs. ? Improve lighting in dim areas. Where to find more information:  [employer] of Neurological Disorders and Stroke: MasterBoxes.it  American Association of Neurological Surgeons: http://www.aans.org  American Academy of Neurology (AAN): http://keith.biz/  Brain Injury Association of America: www.biausa.org Get help right away if:  You develop symptoms of subdural hematoma.  You are taking blood thinners and you fall or you experience minor trauma to the head. If you take any blood thinners, even a very small injury can cause a subdural hematoma. You should get help right away, even if you think your symptoms are mild.  You have a bleeding disorder and you fall or you experience minor trauma to the head.  You experience a head injury and you develop any of the following symptoms: ? Clear fluid draining from your nose or ears. ? Nausea. ? Vomiting. ? Slurred speech. ?  Seizures. ? Drowsiness or a decrease in alertness. ? Double vision. ? Numbness or inability to move (paralysis) in any part of your body. ? Difficulty walking or poor coordination. ? Difficulty thinking. ? Confusion or forgetfulness. ? Personality changes. ? Irrational or aggressive behavior. ? A history of heavy alcohol use. These symptoms may represent a serious problem that is an emergency. Do not wait to see if the symptoms will go away. Get medical help right away. Call your local emergency services (911 in the [place] Do not drive yourself to the hospital. Summary  A subdural hematoma is a collection of blood between  the brain and its tough outer covering.  Treatment for this condition depends on the severity and the type of subdural hemorrhage that you have.  Symptoms can vary from mild to severe and life-threatening.  Monitor your symptoms, and ask others around you to do the same. This information is not intended to replace advice given to you by your health care provider. Make sure you discuss any questions you have with your health care provider. Document Released: 08/11/2004 Document Revised: 08/29/2016 Document Reviewed: 08/29/2016 Elsevier Interactive Patient Education  2018 [employer].   Constipation, Adult Constipation is when a person:  Poops (has a bowel movement) fewer times in a week than normal.  Has a hard time pooping.  Has poop that is dry, hard, or bigger than normal.  Follow these instructions at home: Eating and drinking   Eat foods that have a lot of fiber, such as: ? Fresh fruits and vegetables. ? Whole grains. ? Beans.  Eat less of foods that are high in fat, low in fiber, or overly processed, such as: ? [place] fries. ? Hamburgers. ? Cookies. ? Candy. ? Soda.  Drink enough fluid to keep your pee (urine) clear or pale yellow. General instructions  Exercise regularly or as told by your doctor.  Go to the restroom when you feel like you need to poop. Do not hold it in.  Take over-the-counter and prescription medicines only as told by your doctor. These include any fiber supplements.  Do pelvic floor retraining exercises, such as: ? Doing deep breathing while relaxing your lower belly (abdomen). ? Relaxing your pelvic floor while pooping.  Watch your condition for any changes.  Keep all follow-up visits as told by your doctor. This is important. Contact a doctor if:  You have pain that gets worse.  You have a fever.  You have not pooped for 4 days.  You throw up (vomit).  You are not hungry.  You lose weight.  You are bleeding from the  anus.  You have thin, pencil-like poop (stool). Get help right away if:  You have a fever, and your symptoms suddenly get worse.  You leak poop or have blood in your poop.  Your belly feels hard or bigger than normal (is bloated).  You have very bad belly pain.  You feel dizzy or you faint. This information is not intended to replace advice given to you by your health care provider. Make sure you discuss any questions you have with your health care provider. Document Released: 03/12/2008 Document Revised: 04/13/2016 Document Reviewed: 03/14/2016 Elsevier Interactive Patient Education  2017 [employer].

## 2017-05-18 NOTE — Progress Notes (Signed)
Rt assessed pt on 0000 rounds and found pt sitting at the side of bed. Pt sats 88% on RA and refuses to wear trach collar. Rt asked if pt needed inner cannula cleaned and pt refused. Pt stable and educated to call for help if needed. RT will cont to monitor.

## 2017-05-19 ENCOUNTER — Telehealth: Payer: Self-pay | Admitting: *Deleted

## 2017-05-19 LAB — BPAM PLATELET PHERESIS
Blood Product Expiration Date: 201808112359
Blood Product Expiration Date: 201808122359
ISSUE DATE / TIME: 201808111131
ISSUE DATE / TIME: 201808111619
UNIT TYPE AND RH: 8400
UNIT TYPE AND RH: 8400

## 2017-05-19 LAB — PREPARE PLATELET PHERESIS
UNIT DIVISION: 0
Unit division: 0

## 2017-05-19 NOTE — Telephone Encounter (Signed)
Received call from R. Netty, MD this am that pt had been discharged 05/18/2017 without instructions to have Platelets redrawn on Monday 05/20/2017. MD concerned that even though she would follow up with PCP within 1 week,  Needed lab draw prior to and had not been instructed. CM called pt to instruct as well as left in basket message for PCP to follow up with this pt. LM for pt to return call to [facility] @ [phone]

## 2017-05-20 ENCOUNTER — Telehealth: Payer: Self-pay

## 2017-05-20 ENCOUNTER — Telehealth: Payer: Self-pay | Admitting: Hematology and Oncology

## 2017-05-20 DIAGNOSIS — D696 Thrombocytopenia, unspecified: Secondary | ICD-10-CM

## 2017-05-20 NOTE — Telephone Encounter (Signed)
Voicemail: "We are not able or equipped to perform type and crossmatch in the home or blood transfusions.  Try [facility] or Short Stay.  Call Rhys Martini [phone] if need to talk."    Returned call to [facility]) in reference to voicemail retrieved.  Informed her a CBC with diff & CMET are to be checked every MWF in the home.  THEN, patient to come to [facility] clinic for [place] and transfusion for HGB < 8.   "I will communicate that with staff and call back if any problems."

## 2017-05-20 NOTE — Telephone Encounter (Signed)
[name] from [facility] called. The hospital had let them know pt was dc from hospital this weekend and needed her platelets checked today. 05/20/17. [facility] deferred to [facility] and is asking that [facility] makes sure platelets get checked.  Per telephone note 8/10 pt is getting set up with [facility] for lab draws. Called [facility] and they need demographic sheet. Faxed Demographics and original order to [facility].   Pt called this morning and call is returned. Pt was dc from hospital Saturday and she is asking what Dr Lindi Adie wants her to do. Explained Dr Lindi Adie is arranging for [facility] to do lab draws MWF. No f/u visit in place yet. inbasket sent per discharge recommendation to see Mendel Ryder 8/20.

## 2017-05-20 NOTE — Telephone Encounter (Signed)
-----   Message from Arnetha Courser, MD sent at 05/20/2017  9:20 AM EDT ----- Regarding: FW: Please follow up This should go to the oncologist's office. Please call her oncologist and ask their staff to contact patient about this important issue. Thank you  ----- Message ----- From: Delrae Sawyers, RN Sent: 05/19/2017   1:28 PM To: Mariel Aloe, MD, Arnetha Courser, MD Subject: Please follow up                               I am a CM at [facility] and would like to follow up on this pt. She was discharged without understanding she needs Platelets Drawn on Monday 05/20/2017.  I have called to remind her but PLEASE have your office follow up.  Thanks!

## 2017-05-20 NOTE — Telephone Encounter (Signed)
sw pt to confirm 8/20 at 1 pm per sch msg. Pt will call back of appt needs to be changed

## 2017-05-20 NOTE — Telephone Encounter (Signed)
Transition Care Management Follow-Up Telephone Call   Date discharged and where: 05/18/17 from [facility]  How have you been since you were released from the hospital?  Pt states she is still in pain all over her body. Pain level is a 7 out of 10. Both legs feel numb too. Pt declines SOB, dizziness, n/v/d or fever.  Any patient concerns? None.   Items Reviewed:   Meds: pe declined  Allergies: verified  Dietary Changes Reviewed: none  Functional Questionnaire:  Independent-I Dependent-D  ADLs:   Dressing- I    Eating- I   Maintaining continence- trouble with urinary incontinence   Transferring- has assistance with walker   Transportation- declined answer   Meal Prep- declined answer   Managing Meds- declined answer  Confirmed importance and Date/Time of follow-up visits scheduled: None- pt states she is having home health come out to her home and she does not think she will need to schedule an apt.   Confirmed with patient if condition worsens to call PCP or go to the Emergency Dept. Patient was given office number and encouraged to call back with questions or concerns: YES

## 2017-05-20 NOTE — Telephone Encounter (Signed)
We received this message and have forwarded it to her oncologist.  Left detailed voice mail with nurse.

## 2017-05-20 NOTE — Telephone Encounter (Signed)
05-20-2017 1336 Voicemail: "We are not able or equipped to perform type and crossmatch in the home or blood transfusions.  Try [facility] or [facility] Stay.  Call Rhys Martini [phone] if need to talk."    Returned call to [facility]) in reference to voicemail retrieved.  Informed her a CBC with diff & CMET are to be checked every MWF in the home.  THEN, patient to come to [facility] clinic for [place] and transfusion for HGB < 8.   "I will communicate that with staff and call back if any problems."

## 2017-05-21 ENCOUNTER — Telehealth: Payer: Self-pay

## 2017-05-21 DIAGNOSIS — M6281 Muscle weakness (generalized): Secondary | ICD-10-CM | POA: Diagnosis not present

## 2017-05-21 DIAGNOSIS — D649 Anemia, unspecified: Secondary | ICD-10-CM

## 2017-05-21 DIAGNOSIS — I69292 Facial weakness following other nontraumatic intracranial hemorrhage: Secondary | ICD-10-CM | POA: Diagnosis not present

## 2017-05-21 DIAGNOSIS — Z950 Presence of cardiac pacemaker: Secondary | ICD-10-CM | POA: Diagnosis not present

## 2017-05-21 DIAGNOSIS — Z170 Estrogen receptor positive status [ER+]: Secondary | ICD-10-CM | POA: Diagnosis not present

## 2017-05-21 DIAGNOSIS — I509 Heart failure, unspecified: Secondary | ICD-10-CM | POA: Diagnosis not present

## 2017-05-21 DIAGNOSIS — Z930 Tracheostomy status: Secondary | ICD-10-CM | POA: Diagnosis not present

## 2017-05-21 DIAGNOSIS — C50211 Malignant neoplasm of upper-inner quadrant of right female breast: Secondary | ICD-10-CM | POA: Diagnosis not present

## 2017-05-21 DIAGNOSIS — D696 Thrombocytopenia, unspecified: Secondary | ICD-10-CM | POA: Diagnosis not present

## 2017-05-21 DIAGNOSIS — I110 Hypertensive heart disease with heart failure: Secondary | ICD-10-CM | POA: Diagnosis not present

## 2017-05-21 DIAGNOSIS — I482 Chronic atrial fibrillation: Secondary | ICD-10-CM | POA: Diagnosis not present

## 2017-05-21 DIAGNOSIS — C7951 Secondary malignant neoplasm of bone: Secondary | ICD-10-CM | POA: Diagnosis not present

## 2017-05-21 NOTE — Telephone Encounter (Signed)
Elizabeth from [facility] called that pt will use their services for blood draws. She wanted to clarify since blood was not drawn Monday what days this week should blood be drawn. Instructed her to draw blood on Tuesday and Friday. Pt has appt at [facility] on Monday 8/20 so that home visit was cancelled.   Benjamine Mola stated that [facility] does not supply the heparin and saline and needles to access the pt's port. These will need to be arranged through [facility]. They will do venipuncture today awaiting supplies on Friday.   New HH order placed and faxed to [facility] for the supplies.

## 2017-05-21 NOTE — Telephone Encounter (Signed)
[name] with [facility] called that they were unable to access vein for blood draw. The supplies for port access are not at the home yet. Asking what is the next step- bring her in to [facility] for lab or wait until Thursday or Friday until supplies arrive.

## 2017-05-21 NOTE — Telephone Encounter (Signed)
S/w pt that she needs to come to [facility] for labs tomorrow.   She asked why [facility] was the [facility] agency chosen. She asked this RN to call Octavia Heir and ask her opinion.  Called [place] and discussed [place] agency. [place] said to stay with [facility]. Called pt back with this information.   When this RN called pt back she stated she completely disagrees with [facility]. She wants to go with [facility]. She wants her labs drawn tomorrow at [facility] in [place]. She states if she needs platelets or blood she wants it done at day surgery at [facility].

## 2017-05-22 ENCOUNTER — Encounter: Payer: Self-pay | Admitting: Emergency Medicine

## 2017-05-22 ENCOUNTER — Emergency Department: Payer: PPO

## 2017-05-22 ENCOUNTER — Ambulatory Visit
Admission: RE | Admit: 2017-05-22 | Discharge: 2017-05-22 | Disposition: A | Discharge status: Home / Self Care | Payer: PPO | Source: Ambulatory Visit | Attending: Hematology and Oncology | Admitting: Hematology and Oncology

## 2017-05-22 ENCOUNTER — Other Ambulatory Visit: Payer: Self-pay | Admitting: Oncology

## 2017-05-22 ENCOUNTER — Other Ambulatory Visit: Payer: Self-pay

## 2017-05-22 ENCOUNTER — Telehealth: Payer: Self-pay | Admitting: *Deleted

## 2017-05-22 ENCOUNTER — Observation Stay
Admission: EM | Admit: 2017-05-22 | Discharge: 2017-05-24 | Disposition: A | Discharge status: Other Acute Inpatient Hospital | Payer: PPO | Attending: Internal Medicine | Admitting: Internal Medicine

## 2017-05-22 DIAGNOSIS — I509 Heart failure, unspecified: Secondary | ICD-10-CM | POA: Insufficient documentation

## 2017-05-22 DIAGNOSIS — M352 Behcet's disease: Secondary | ICD-10-CM | POA: Diagnosis not present

## 2017-05-22 DIAGNOSIS — I252 Old myocardial infarction: Secondary | ICD-10-CM | POA: Insufficient documentation

## 2017-05-22 DIAGNOSIS — Z9221 Personal history of antineoplastic chemotherapy: Secondary | ICD-10-CM | POA: Diagnosis not present

## 2017-05-22 DIAGNOSIS — R200 Anesthesia of skin: Secondary | ICD-10-CM

## 2017-05-22 DIAGNOSIS — D696 Thrombocytopenia, unspecified: Secondary | ICD-10-CM | POA: Diagnosis not present

## 2017-05-22 DIAGNOSIS — R296 Repeated falls: Secondary | ICD-10-CM | POA: Insufficient documentation

## 2017-05-22 DIAGNOSIS — Z930 Tracheostomy status: Secondary | ICD-10-CM | POA: Insufficient documentation

## 2017-05-22 DIAGNOSIS — C50211 Malignant neoplasm of upper-inner quadrant of right female breast: Secondary | ICD-10-CM | POA: Insufficient documentation

## 2017-05-22 DIAGNOSIS — E1122 Type 2 diabetes mellitus with diabetic chronic kidney disease: Secondary | ICD-10-CM | POA: Diagnosis not present

## 2017-05-22 DIAGNOSIS — Z8719 Personal history of other diseases of the digestive system: Secondary | ICD-10-CM | POA: Diagnosis not present

## 2017-05-22 DIAGNOSIS — I130 Hypertensive heart and chronic kidney disease with heart failure and stage 1 through stage 4 chronic kidney disease, or unspecified chronic kidney disease: Secondary | ICD-10-CM | POA: Diagnosis not present

## 2017-05-22 DIAGNOSIS — Z950 Presence of cardiac pacemaker: Secondary | ICD-10-CM | POA: Diagnosis not present

## 2017-05-22 DIAGNOSIS — Z888 Allergy status to other drugs, medicaments and biological substances status: Secondary | ICD-10-CM | POA: Insufficient documentation

## 2017-05-22 DIAGNOSIS — E876 Hypokalemia: Secondary | ICD-10-CM | POA: Insufficient documentation

## 2017-05-22 DIAGNOSIS — Z91012 Allergy to eggs: Secondary | ICD-10-CM | POA: Insufficient documentation

## 2017-05-22 DIAGNOSIS — D649 Anemia, unspecified: Secondary | ICD-10-CM | POA: Diagnosis not present

## 2017-05-22 DIAGNOSIS — I2510 Atherosclerotic heart disease of native coronary artery without angina pectoris: Secondary | ICD-10-CM | POA: Diagnosis not present

## 2017-05-22 DIAGNOSIS — N390 Urinary tract infection, site not specified: Secondary | ICD-10-CM | POA: Diagnosis not present

## 2017-05-22 DIAGNOSIS — N183 Chronic kidney disease, stage 3 (moderate): Secondary | ICD-10-CM | POA: Diagnosis not present

## 2017-05-22 DIAGNOSIS — F17210 Nicotine dependence, cigarettes, uncomplicated: Secondary | ICD-10-CM | POA: Diagnosis not present

## 2017-05-22 DIAGNOSIS — Z8249 Family history of ischemic heart disease and other diseases of the circulatory system: Secondary | ICD-10-CM | POA: Insufficient documentation

## 2017-05-22 DIAGNOSIS — C7951 Secondary malignant neoplasm of bone: Secondary | ICD-10-CM | POA: Insufficient documentation

## 2017-05-22 DIAGNOSIS — D594 Other nonautoimmune hemolytic anemias: Secondary | ICD-10-CM | POA: Diagnosis not present

## 2017-05-22 DIAGNOSIS — Z7901 Long term (current) use of anticoagulants: Secondary | ICD-10-CM | POA: Insufficient documentation

## 2017-05-22 DIAGNOSIS — Z8673 Personal history of transient ischemic attack (TIA), and cerebral infarction without residual deficits: Secondary | ICD-10-CM | POA: Insufficient documentation

## 2017-05-22 DIAGNOSIS — I482 Chronic atrial fibrillation: Secondary | ICD-10-CM | POA: Insufficient documentation

## 2017-05-22 DIAGNOSIS — R0989 Other specified symptoms and signs involving the circulatory and respiratory systems: Secondary | ICD-10-CM

## 2017-05-22 DIAGNOSIS — Z79899 Other long term (current) drug therapy: Secondary | ICD-10-CM | POA: Insufficient documentation

## 2017-05-22 LAB — URINALYSIS, ROUTINE W REFLEX MICROSCOPIC
Bilirubin Urine: NEGATIVE
GLUCOSE, UA: NEGATIVE mg/dL
KETONES UR: NEGATIVE mg/dL
Nitrite: POSITIVE — AB
PROTEIN: 30 mg/dL — AB
Specific Gravity, Urine: 1.01 (ref 1.005–1.030)
Squamous Epithelial / LPF: NONE SEEN
pH: 6 (ref 5.0–8.0)

## 2017-05-22 LAB — URINE DRUG SCREEN, QUALITATIVE (ARMC ONLY)
AMPHETAMINES, UR SCREEN: NOT DETECTED
BARBITURATES, UR SCREEN: NOT DETECTED
BENZODIAZEPINE, UR SCRN: NOT DETECTED
Cannabinoid 50 Ng, Ur ~~LOC~~: NOT DETECTED
Cocaine Metabolite,Ur ~~LOC~~: NOT DETECTED
MDMA (Ecstasy)Ur Screen: NOT DETECTED
METHADONE SCREEN, URINE: NOT DETECTED
OPIATE, UR SCREEN: NOT DETECTED
PHENCYCLIDINE (PCP) UR S: NOT DETECTED
Tricyclic, Ur Screen: NOT DETECTED

## 2017-05-22 LAB — DIFFERENTIAL
BASOS ABS: 0 10*3/uL (ref 0–0.1)
BASOS PCT: 0 %
Band Neutrophils: 1 %
Blasts: 0 %
EOS ABS: 0 10*3/uL (ref 0–0.7)
Eosinophils Relative: 0 %
LYMPHS PCT: 54 %
Lymphs Abs: 3.3 10*3/uL (ref 1.0–3.6)
MONO ABS: 0.2 10*3/uL (ref 0.2–0.9)
MYELOCYTES: 3 %
Metamyelocytes Relative: 1 %
Monocytes Relative: 4 %
NRBC: 45 /100{WBCs} — AB
Neutro Abs: 2.6 10*3/uL (ref 1.4–6.5)
Neutrophils Relative %: 37 %
Other: 0 %
PROMYELOCYTES ABS: 0 %

## 2017-05-22 LAB — BASIC METABOLIC PANEL
Anion gap: 8 (ref 5–15)
BUN: 23 mg/dL — ABNORMAL HIGH (ref 6–20)
CALCIUM: 10.9 mg/dL — AB (ref 8.9–10.3)
CHLORIDE: 106 mmol/L (ref 101–111)
CO2: 23 mmol/L (ref 22–32)
CREATININE: 1.77 mg/dL — AB (ref 0.44–1.00)
GFR, EST AFRICAN AMERICAN: 34 mL/min — AB (ref >60)
GFR, EST NON AFRICAN AMERICAN: 29 mL/min — AB (ref >60)
Glucose, Bld: 95 mg/dL (ref 65–99)
Potassium: 3.9 mmol/L (ref 3.5–5.1)
SODIUM: 137 mmol/L (ref 135–145)

## 2017-05-22 LAB — CBC
HCT: 23.1 % — ABNORMAL LOW (ref 35.0–47.0)
HCT: 26 % — ABNORMAL LOW (ref 35.0–47.0)
HEMOGLOBIN: 7.6 g/dL — AB (ref 12.0–16.0)
Hemoglobin: 8.7 g/dL — ABNORMAL LOW (ref 12.0–16.0)
MCH: 29.4 pg (ref 26.0–34.0)
MCH: 30 pg (ref 26.0–34.0)
MCHC: 33.1 g/dL (ref 32.0–36.0)
MCHC: 33.5 g/dL (ref 32.0–36.0)
MCV: 88.8 fL (ref 80.0–100.0)
MCV: 89.5 fL (ref 80.0–100.0)
PLATELETS: 16 10*3/uL — AB (ref 150–440)
PLATELETS: 17 10*3/uL — AB (ref 150–440)
RBC: 2.6 MIL/uL — AB (ref 3.80–5.20)
RBC: 2.91 MIL/uL — ABNORMAL LOW (ref 3.80–5.20)
RDW: 18.9 % — ABNORMAL HIGH (ref 11.5–14.5)
RDW: 17.9 % — AB (ref 11.5–14.5)
WBC: 4.8 10*3/uL (ref 3.6–11.0)
WBC: 6.1 10*3/uL (ref 3.6–11.0)

## 2017-05-22 LAB — COMPREHENSIVE METABOLIC PANEL
ALBUMIN: 3.4 g/dL — AB (ref 3.5–5.0)
ALT: 78 U/L — AB (ref 14–54)
AST: 257 U/L — AB (ref 15–41)
Alkaline Phosphatase: 146 U/L — ABNORMAL HIGH (ref 38–126)
Anion gap: 10 (ref 5–15)
BUN: 22 mg/dL — AB (ref 6–20)
CHLORIDE: 106 mmol/L (ref 101–111)
CO2: 21 mmol/L — AB (ref 22–32)
CREATININE: 1.63 mg/dL — AB (ref 0.44–1.00)
Calcium: 10.5 mg/dL — ABNORMAL HIGH (ref 8.9–10.3)
GFR calc Af Amer: 37 mL/min — ABNORMAL LOW (ref >60)
GFR calc non Af Amer: 32 mL/min — ABNORMAL LOW (ref >60)
GLUCOSE: 100 mg/dL — AB (ref 65–99)
POTASSIUM: 4.1 mmol/L (ref 3.5–5.1)
SODIUM: 137 mmol/L (ref 135–145)
Total Bilirubin: 1.8 mg/dL — ABNORMAL HIGH (ref 0.3–1.2)
Total Protein: 7.4 g/dL (ref 6.5–8.1)

## 2017-05-22 LAB — TROPONIN I
TROPONIN I: 0.03 ng/mL — AB (ref <0.03)
TROPONIN I: 0.03 ng/mL — AB (ref <0.03)

## 2017-05-22 LAB — ETHANOL

## 2017-05-22 LAB — PATHOLOGIST SMEAR REVIEW

## 2017-05-22 LAB — TYPE AND SCREEN
ABO/RH(D): AB POS
Antibody Screen: NEGATIVE

## 2017-05-22 LAB — PROTIME-INR
INR: 1.31
PROTHROMBIN TIME: 16.4 s — AB (ref 11.4–15.2)

## 2017-05-22 LAB — PREPARE RBC (CROSSMATCH)

## 2017-05-22 LAB — APTT: aPTT: 36 seconds (ref 24–36)

## 2017-05-22 MED ORDER — ONDANSETRON HCL 4 MG PO TABS: 8.0000 mg | ORAL_TABLET | Freq: Two times a day (BID) | ORAL | Status: DC | PRN

## 2017-05-22 MED ORDER — LOSARTAN POTASSIUM 50 MG PO TABS
100.0000 mg | ORAL_TABLET | Freq: Every day | ORAL | Status: DC
  Administered 2017-05-24: 10:00:00 100 mg via ORAL
  Filled 2017-05-22: qty 2

## 2017-05-22 MED ORDER — GABAPENTIN 300 MG PO CAPS
300.0000 mg | ORAL_CAPSULE | Freq: Two times a day (BID) | ORAL | Status: DC
  Administered 2017-05-22 – 2017-05-23 (×2): 300 mg via ORAL
  Filled 2017-05-22 (×2): qty 1

## 2017-05-22 MED ORDER — ONDANSETRON HCL 4 MG/2ML IJ SOLN: 4.0000 mg | Freq: Four times a day (QID) | INTRAMUSCULAR | Status: DC | PRN

## 2017-05-22 MED ORDER — LABETALOL HCL 100 MG PO TABS
100.0000 mg | ORAL_TABLET | Freq: Two times a day (BID) | ORAL | Status: DC
  Administered 2017-05-22: 23:00:00 100 mg via ORAL
  Filled 2017-05-22 (×2): qty 1

## 2017-05-22 MED ORDER — SODIUM CHLORIDE 0.9 % IV SOLN
Freq: Once | INTRAVENOUS | Status: AC
  Administered 2017-05-23: 05:00:00 via INTRAVENOUS

## 2017-05-22 MED ORDER — ACETAMINOPHEN 325 MG PO TABS
650.0000 mg | ORAL_TABLET | Freq: Four times a day (QID) | ORAL | Status: DC | PRN
  Administered 2017-05-23: 650 mg via ORAL
  Filled 2017-05-22: qty 2

## 2017-05-22 MED ORDER — OXYCODONE HCL 5 MG PO TABS
5.0000 mg | ORAL_TABLET | ORAL | Status: DC | PRN
  Administered 2017-05-23 – 2017-05-24 (×3): 5 mg via ORAL
  Filled 2017-05-22 (×3): qty 1

## 2017-05-22 MED ORDER — TIZANIDINE HCL 2 MG PO TABS
2.0000 mg | ORAL_TABLET | Freq: Three times a day (TID) | ORAL | Status: DC | PRN
  Filled 2017-05-22: qty 1

## 2017-05-22 MED ORDER — SODIUM CHLORIDE 0.9 % IV SOLN: 250.0000 mL | Freq: Once | INTRAVENOUS | Status: DC

## 2017-05-22 MED ORDER — SODIUM CHLORIDE 0.9% FLUSH: 3.0000 mL | INTRAVENOUS | Status: DC | PRN

## 2017-05-22 MED ORDER — PANTOPRAZOLE SODIUM 40 MG PO TBEC
40.0000 mg | DELAYED_RELEASE_TABLET | Freq: Every day | ORAL | Status: DC
  Administered 2017-05-23 – 2017-05-24 (×2): 40 mg via ORAL
  Filled 2017-05-22 (×2): qty 1

## 2017-05-22 MED ORDER — HEPARIN SOD (PORK) LOCK FLUSH 100 UNIT/ML IV SOLN: 250.0000 [IU] | INTRAVENOUS | Status: DC | PRN

## 2017-05-22 MED ORDER — LETROZOLE 2.5 MG PO TABS
2.5000 mg | ORAL_TABLET | Freq: Every day | ORAL | Status: DC
  Administered 2017-05-23 – 2017-05-24 (×2): 2.5 mg via ORAL
  Filled 2017-05-22 (×3): qty 1

## 2017-05-22 MED ORDER — SODIUM CHLORIDE 0.9% FLUSH: 10.0000 mL | INTRAVENOUS | Status: DC | PRN

## 2017-05-22 MED ORDER — LABETALOL HCL 5 MG/ML IV SOLN: 10.0000 mg | Freq: Once | INTRAVENOUS | Status: DC

## 2017-05-22 MED ORDER — POTASSIUM CHLORIDE CRYS ER 10 MEQ PO TBCR
10.0000 meq | EXTENDED_RELEASE_TABLET | Freq: Three times a day (TID) | ORAL | Status: DC
  Administered 2017-05-22 – 2017-05-24 (×5): 10 meq via ORAL
  Filled 2017-05-22 (×5): qty 1

## 2017-05-22 MED ORDER — DEXTROSE 5 % IV SOLN
1.0000 g | INTRAVENOUS | Status: DC
  Administered 2017-05-22 – 2017-05-23 (×2): 1 g via INTRAVENOUS
  Filled 2017-05-22 (×4): qty 10

## 2017-05-22 MED ORDER — ACETAMINOPHEN 325 MG PO TABS: 650.0000 mg | ORAL_TABLET | Freq: Once | ORAL | Status: DC

## 2017-05-22 MED ORDER — LEVETIRACETAM 500 MG PO TABS
500.0000 mg | ORAL_TABLET | Freq: Two times a day (BID) | ORAL | Status: DC
  Administered 2017-05-22 – 2017-05-24 (×4): 500 mg via ORAL
  Filled 2017-05-22 (×5): qty 1

## 2017-05-22 MED ORDER — DIPHENHYDRAMINE HCL 25 MG PO CAPS: 25.0000 mg | ORAL_CAPSULE | Freq: Once | ORAL | Status: DC

## 2017-05-22 MED ORDER — SODIUM CHLORIDE 0.9% FLUSH
3.0000 mL | Freq: Two times a day (BID) | INTRAVENOUS | Status: DC
  Administered 2017-05-23: 3 mL via INTRAVENOUS

## 2017-05-22 MED ORDER — SODIUM CHLORIDE 0.9% FLUSH
3.0000 mL | Freq: Two times a day (BID) | INTRAVENOUS | Status: DC
  Administered 2017-05-23 – 2017-05-24 (×3): 3 mL via INTRAVENOUS

## 2017-05-22 MED ORDER — SODIUM CHLORIDE 0.9 % IV SOLN: 250.0000 mL | INTRAVENOUS | Status: DC | PRN

## 2017-05-22 MED ORDER — ONDANSETRON HCL 4 MG PO TABS: 4.0000 mg | ORAL_TABLET | Freq: Four times a day (QID) | ORAL | Status: DC | PRN

## 2017-05-22 MED ORDER — LATANOPROST 0.005 % OP SOLN
1.0000 [drp] | Freq: Every day | OPHTHALMIC | Status: DC
  Administered 2017-05-22 – 2017-05-23 (×2): 1 [drp] via OPHTHALMIC
  Filled 2017-05-22: qty 2.5

## 2017-05-22 MED ORDER — LIDOCAINE-PRILOCAINE 2.5-2.5 % EX CREA
1.0000 "application " | TOPICAL_CREAM | Freq: Every day | CUTANEOUS | Status: DC | PRN
  Filled 2017-05-22: qty 5

## 2017-05-22 MED ORDER — ACETAMINOPHEN 650 MG RE SUPP: 650.0000 mg | Freq: Four times a day (QID) | RECTAL | Status: DC | PRN

## 2017-05-22 MED ORDER — HEPARIN SOD (PORK) LOCK FLUSH 100 UNIT/ML IV SOLN: 500.0000 [IU] | Freq: Every day | INTRAVENOUS | Status: DC | PRN

## 2017-05-22 NOTE — Progress Notes (Unsigned)
This 66 year old [place] woman is followed by my partner Dr. Sonny Dandy for history of breast cancer with diffuse bony metastases. She is also anemic and thrombocytopenic. She was set up for transfusion of blood and platelets today at [facility].  The patient did receive the blood, but was unable to receive the platelets because of a crossmatch problem. It is expected that she will have a unit of platelets available for transfusion tomorrow.  I was called by Dr. Dicie Beam the pathologist in [place] who had been asked to review the patient's blood smear. She feels that there are schistocytes and nucleated red blood cells on the smear and she wonders if the patient has hemolysis. 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 Accordingly I wrote out orders for a reticulocyte count, LDH, DAT,ADAMTS13 and of course repeat CBC tomorrow, but before we could call these orders in the triage nurse returned to tell me that the patient told  them in [place] that she did not feel comfortable going home because she is alone and it is not safe in her estimation. Accordingly we have asked them to direct this patient to the emergency room  I have alerted our physician on call regarding the overall situation in case he is called for details.

## 2017-05-22 NOTE — Progress Notes (Signed)
Pt. Admitted to hospital from home with Shiley tracheostomy #4 cuffless. Trach cleaned and inner cannula replaced. Lurline Idol appears clean and dry. Spare trach placed at bedside.

## 2017-05-22 NOTE — ED Notes (Signed)
Attempted to call report. Receiving RN is in huddle and will call back for report.

## 2017-05-22 NOTE — Telephone Encounter (Signed)
1508 hgb 7.6 and platelet count 16. Ordered 1 unit prbc and 1 unit platelet per Dr Geralyn Flash parameters on order form for home health referral.   Pt scheduled for home health visit on Friday for lab draw.

## 2017-05-22 NOTE — ED Provider Notes (Signed)
[facility] Emergency Department Provider Note  ____________________________________________   First MD Initiated Contact with Patient 05/22/17 1657     (approximate)  I have reviewed the triage vital signs and the nursing notes.   HISTORY  Chief Complaint Weakness   HPI [name] is a 66 y.o. female with a history of atrial fibrillation, formerly on Coumadin, who is presenting to the emergency department with left-sided facial numbness. She was at the same-day surgery center to be transfused with red blood cells and platelets today when the nursing staff there receiving the complaint from the patient that she had been having intermittent left-sided facial numbness over the past 3 days. She is also experiencing bilateral lower extremity numbness and weakness over the past 3 days but is denying any numbness or weakness in lower extremities as of now. She says that she has persistent left-sided facial numbness and cannot feel the left side of her face. She says that she does not remember the exact time of onset of this last episode but says that it usually lasts only about 15 minutes then goes away. The patient says that she has been anemic from a medication that she is being given for her cancer therapy. The patient has breast cancer. She has since been stopped from taking any more doses of this medication. The patient says that she had had intermittent bleeding in her stool as well as from her trach in her urine. However, she is denying any recent blood in her stool or in her urine. She said there was also found to be a small hematoma in her brain on a CAT scan several weeks ago.   Past Medical History:  Diagnosis Date  . 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 Anticoagulated on Coumadin   . Arthritis   . Atherosclerosis of coronary artery of native heart without angina pectoris   . Atrial fibrillation, chronic ([place])   . 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 Breast cancer  ([place]) 06/21/2016   Right breast  . CHF (congestive heart failure) ([place])   . Chronic hypokalemia   . Chronic kidney disease    stage III  . Chronic pain due to trauma   . Difficult intubation    permanent tracheostomy;lungs filled with water when extubated  . Frequent headaches   . GERD (gastroesophageal reflux disease)   . History of stomach ulcers   . Hypertension   . Hypertension, goal below 140/90   . Myocardial infarction ([place])   . PONV (postoperative nausea and vomiting)   . 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 Spinal headache   . 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 Trachea, ring   . Tracheostomy in place [facility])   . Urinary tract infection, recurrent   . Urine incontinence     Patient Active Problem List   Diagnosis Date Noted  . Acute subdural hematoma ([place]) 05/13/2017  . Thrombocytopenia ([place]) 05/13/2017  . Bilateral leg numbness 05/13/2017  . Malignant neoplasm of upper-outer quadrant of right breast in female, estrogen receptor positive ([place]) 01/21/2017  . Breast cancer ([place]) 12/13/2016  . Decreased breath sounds at left lung base 11/13/2016  . Anemia due to antineoplastic chemotherapy 09/25/2016  . Anemia 09/25/2016  . Port catheter in place 08/31/2016  . Breast cancer of upper-inner quadrant of right female breast ([place]) 07/05/2016  . Hypokalemia 05/30/2016  .  Mass of breast, right 05/30/2016  . Proptosis 05/30/2016  . Chronic kidney disease (CKD), stage III (moderate) 08/11/2015  . Cramp in muscle 07/11/2015  . H/O injury, presenting hazards to health 07/11/2015  . Esophagitis, reflux 07/11/2015  . Chronic pain due to injury 07/11/2015  . Chronic hypokalemia 07/11/2015  . Atrial fibrillation, chronic ([place]) 07/11/2015  . CAD in native artery 07/11/2015  . Erythema nodosum, acute form 07/11/2015  . Pain in joint of left wrist 07/11/2015  .  Hypertension goal BP (blood pressure) < 140/90 01/04/2014  . Glaucoma 01/04/2014  . 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 Inflammation of uveal tract 01/04/2014  . 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 Tracheostomy status ([place]) 09/23/2012  . Aneurysm, cerebral 08/13/2012  . Cerebral vascular accident ([place]) 08/13/2012  . 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 Encounter for monitoring coumadin therapy 06/11/2012    Past Surgical History:  Procedure Laterality Date  . ABDOMINAL HYSTERECTOMY    . BACK SURGERY    . BLADDER SURGERY     hitch  . CHOLECYSTECTOMY    . FRACTURE SURGERY     B/L feet and right ankle  . GANGLION CYST EXCISION  2011  . MASTECTOMY MODIFIED RADICAL Bilateral 12/13/2016   Procedure: RIGHT MODIFIED RADICAL MASTECTOMY, LEFT SIMPLE MASTECTOMY;  Surgeon: Coralie Keens, MD;  Location: [facility];  Service: General;  Laterality: Bilateral;  . PACEMAKER INSERTION  10/29/2005  . PACEMAKER PLACEMENT  08/22/2012  . PORT A CATH INJECTION ([place] [facility])  07/18/2016  . PORTACATH PLACEMENT Right 07/18/2016   Procedure: INSERTION PORT-A-CATH;  Surgeon: Coralie Keens, MD;  Location: [facility];  Service: General;  Laterality: Right;  . TRACHEAL SURGERY  2000  . UVULOPALATOPHARYNGOPLASTY      Prior to Admission medications   Medication Sig Start Date End Date Taking? Authorizing Provider  cholecalciferol (VITAMIN D) 1000 UNITS tablet Take 1,000 Units by mouth daily.    [provider]  gabapentin (NEURONTIN) 300 MG capsule Take 1 capsule (300 mg total) by mouth 2 (two) times daily. 05/18/17   Mariel Aloe, MD  latanoprost (XALATAN) 0.005 % ophthalmic solution Place 1 drop into both eyes at bedtime.  05/04/10   [provider]  letrozole (FEMARA) 2.5 MG tablet Take 1 tablet (2.5 mg total) by mouth daily. 12/24/16   Nicholas Lose, MD  levETIRAcetam (KEPPRA) 500 MG tablet Take 1 tablet (500 mg total) by mouth 2 (two) times daily.  05/18/17   Mariel Aloe, MD  lidocaine-prilocaine (EMLA) cream Apply to affected area once Patient taking differently: Apply 1 application topically daily as needed (prior to port access). Apply to affected area once 07/05/16   Nicholas Lose, MD  losartan (COZAAR) 100 MG tablet Take 1 tablet (100 mg total) by mouth daily. Patient taking differently: Take 100 mg by mouth at bedtime.  08/02/16   Lada, Satira Anis, MD  ondansetron (ZOFRAN) 8 MG tablet Take 1 tablet (8 mg total) by mouth 2 (two) times daily as needed for refractory nausea / vomiting. Start on day 3 after chemo. 07/05/16   Nicholas Lose, MD  oxyCODONE (OXY IR/ROXICODONE) 5 MG immediate release tablet Take 1-2 tablets (5-10 mg total) by mouth every 4 (four) hours as needed for moderate pain. 12/18/16   Coralie Keens, MD  pantoprazole (PROTONIX) 40 MG tablet TAKE 1 TABLET BY MOUTH EVERY DAY 02/25/17   Lada, Satira Anis, MD  potassium chloride (KLOR-CON M10) 10 MEQ tablet Take 1 tablet (10  mEq total) by mouth 3 (three) times daily. 07/06/16   Arnetha Courser, MD  prochlorperazine (COMPAZINE) 10 MG tablet Take 1 tablet (10 mg total) by mouth every 6 (six) hours as needed for nausea or vomiting. 07/17/16   Nicholas Lose, MD  tizanidine (ZANAFLEX) 2 MG capsule Take 1-2 capsules (2-4 mg total) by mouth every 8 (eight) hours as needed for muscle spasms. 01/04/17   Arnetha Courser, MD  triamcinolone cream (KENALOG) 0.1 % Apply 1 application topically 2 (two) times daily. If needed Patient not taking: Reported on 04/26/2017 11/13/16   Arnetha Courser, MD    Allergies Lisinopril and Eggs or egg-derived products  Family History  Problem Relation Age of Onset  . Heart disease Mother   . Hypertension Mother   . Anemia Mother   . Heart disease Father   . Hypertension Father   . Anemia Father   . Cancer Unknown   . Heart disease Sister   . Heart disease Brother   . Mental illness Brother     Social History Social History  Substance Use Topics    . Smoking status: Current Some Day Smoker    Years: 40.00    Types: Cigarettes  . Smokeless tobacco: Never Used     Comment: 3 a week.  pt can't remember when she had one last  . Alcohol use No    Review of Systems  Constitutional: No fever/chills Eyes: No visual changes. ENT: No sore throat. Cardiovascular: Denies chest pain. Respiratory: Denies shortness of breath. Gastrointestinal: No abdominal pain.  No nausea, no vomiting.  No diarrhea.  No constipation. Genitourinary: Negative for dysuria. Musculoskeletal: Negative for back pain. Skin: Negative for rash. Neurological: Negative for headaches   ____________________________________________   PHYSICAL EXAM:  VITAL SIGNS: ED Triage Vitals  Enc Vitals Group     BP      Pulse      Resp      Temp      Temp src      SpO2      Weight      Height      Head Circumference      Peak Flow      Pain Score      Pain Loc      Pain Edu?      Excl. in [place]?     Constitutional: Alert and oriented. Well appearing and in no acute distress. Eyes: Conjunctivae are normal.  Head: Atraumatic. Nose: No congestion/rhinnorhea. Mouth/Throat: Mucous membranes are moist.  Neck: No stridor.  Tracheostomy in place without any secretions or bleeding. Cardiovascular: Normal rate, regular rhythm. Grossly normal heart sounds.  Good peripheral circulation with equal and bilateral dorsalis pedis pulses. Respiratory: Normal respiratory effort.  No retractions. Lungs CTAB. Gastrointestinal: Soft and nontender. No distention.  Musculoskeletal: No lower extremity tenderness nor edema.  No joint effusions. Neurologic:  Normal speech and language. Left-sided facial numbness to light touch. No facial paralysis noted. Skin:  Skin is warm, dry and intact. No rash noted. Psychiatric: Mood and affect are normal. Speech and behavior are normal.  NIH Stroke Scale   Person Administering Scale: Doran Stabler  Administer stroke scale items in the  order listed. Record performance in each category after each subscale exam. Do not go back and change scores. Follow directions provided for each exam technique. Scores should reflect what the patient does, not what the clinician thinks the patient can do. The clinician should record answers while  administering the exam and work quickly. Except where indicated, the patient should not be coached (i.e., repeated requests to patient to make a special effort).   1a  Level of consciousness: 0=alert; keenly responsive  1b. LOC questions:  0=Performs both tasks correctly  1c. LOC commands: 0=Performs both tasks correctly  2.  Best Gaze: 0=normal  3.  Visual: 0=No visual loss  4. Facial Palsy: 0=Normal symmetric movement  5a.  Motor left arm: 0=No drift, limb holds 90 (or 45) degrees for full 10 seconds  5b.  Motor right arm: 0=No drift, limb holds 90 (or 45) degrees for full 10 seconds  6a. motor left leg: 0=No drift, limb holds 90 (or 45) degrees for full 10 seconds  6b  Motor right leg:  0=No drift, limb holds 90 (or 45) degrees for full 10 seconds  7. Limb Ataxia: 0=Absent  8.  Sensory: 2=Severe to total sensory loss; patient is not aware of being touched in face, arm, leg  9. Best Language:  0=No aphasia, normal  10. Dysarthria: 0=Normal  11. Extinction and Inattention: 0=No abnormality  12. Distal motor function: 0=Normal   Total:   2   ____________________________________________   LABS (all labs ordered are listed, but only abnormal results are displayed)  Labs Reviewed  ETHANOL  PROTIME-INR  APTT  CBC  DIFFERENTIAL  COMPREHENSIVE METABOLIC PANEL  RAPID URINE DRUG SCREEN, HOSP PERFORMED  URINALYSIS, ROUTINE W REFLEX MICROSCOPIC  TROPONIN I   ____________________________________________  EKG  ED ECG REPORT I, Doran Stabler, the attending physician, personally viewed and interpreted this ECG.   Date: 05/22/2017  EKG Time: 1739  Rate: 89  Rhythm: normal sinus  rhythm  Axis: Normal  Intervals:none  ST&T Change: Biphasic T waves in V5 as well as V6. No ST segment elevation or depression.  ____________________________________________  RADIOLOGY  Discussed the head CT with Dr. Warnell Bureau who does not see any acute bleeding in the brain. He says there is possible hygroma on the left side from remote bleeding. However, he finds no acute intracranial condition at this time. ____________________________________________   PROCEDURES  Procedure(s) performed:   Procedures  Critical Care performed:   ____________________________________________   INITIAL IMPRESSION / ASSESSMENT AND PLAN / ED COURSE  Pertinent labs & imaging results that were available during my care of the patient were reviewed by me and considered in my medical decision making (see chart for details).  Patient wheeled to the emergency department from same day surgery with blood already being administered. She has been symptomatic for 3 days now off and on with a recent intracranial hemorrhage noted on the CAT scan from October 6. She has not a TPA candidate. We will reperform a CAT scan of her brain. Patient will likely need to be transferred to [facility], where she is known to the oncology service.    ----------------------------------------- 5:43 PM on 05/22/2017 -----------------------------------------  I discussed the case with Dr. Alen Blew of oncology and [facility] in [place]. He says that the patient should be able to state [facility] for her platelet transfusion As long as there is no acute neurological finding requiring transfer to [facility].   ----------------------------------------- 5:45 PM on 05/22/2017 -----------------------------------------  Patient will require admission urine [facility]. No acute finding on her head CT today. Patient aware of need for admission. Signed out to Dr.  Posey Pronto. ____________________________________________   FINAL CLINICAL IMPRESSION(S) / ED DIAGNOSES  Anemia. Thrombocytopenia. Left-sided facial numbness.  NEW MEDICATIONS STARTED DURING THIS VISIT:  New Prescriptions   No medications on file     Note:  This document was prepared using Dragon voice recognition software and may include unintentional dictation errors.     Orbie Pyo, MD 05/22/17 [phone]

## 2017-05-22 NOTE — Progress Notes (Signed)
Patient states that she does not feel well enough to go home by herself with the symptoms of numbness inher left side of face and legs.  Numbness has been going on for 3 days andshe had attributed it to need for platelets, states she had had a few CT scans in the past 2 weeks.  "I just don't feel good".

## 2017-05-22 NOTE — Telephone Encounter (Signed)
This nurse received call from "Dr. Dicie Beam [phone].  Please flag today's blood smear.  It is significant for a provider to review.  My note should be self explanatory.  Provider can call me directly."   Spoke with Dr. Jana Hakim.  "I have spoken with Pathologist."

## 2017-05-22 NOTE — ED Notes (Signed)
Date and time results received: 05/22/17 6:33 PM  (use smartphrase ".now" to insert current time)  Test: troponin Critical Value: 0.03  Name of Provider Notified: Dr. Clearnce Hasten  Orders Received? Or Actions Taken?: Orders Received - See Orders for details

## 2017-05-22 NOTE — Telephone Encounter (Signed)
1508[facility] blood bank called that platelets will need to be crossmatched, this will add over 5 hours to the process. 1600S/w Dr Jana Hakim and he said was OK for pt to go home. When explained that to the pt she stated she felt weak and unsafe to go home alone. S/w Dr Jana Hakim again and arranged for pt to be admitted to hospital. The [facility] same day surgery staff will have hospitalist see pt at their area to try to prevent pt having to go through ER.

## 2017-05-22 NOTE — ED Triage Notes (Addendum)
Pt brought over from [place] where she was receiving a blood transfusion for low platelets and hemoglobin. Pt received one unit blood and platelets are on order per SDS RN. Pt brought for evaluation of left side facial numbness and tingling for three days with generalized weakness.

## 2017-05-22 NOTE — ED Notes (Signed)
Pt returns from CT at this time.

## 2017-05-22 NOTE — ED Notes (Signed)
Rolling to floor and will give bedside report on pt

## 2017-05-22 NOTE — Progress Notes (Signed)
Called hospitalist on call, reported symptoms.  Patient will be taken to ED as per request of hospitalist.

## 2017-05-22 NOTE — Telephone Encounter (Signed)
0900 s/w May RN who said [facility] cannot accept Ms Coonan as a pt there  0915 call [facility] and lvm asking if they can do lab draw and possible platelets at their facility today 0920 called [facility] and verified they do not access ports for lab draws.  0958 return call from [facility], they cannot do lab/possible platelets. 1005 called [facility], they do not access port for lab draw  S/w pt and requested she go [facility] lab for lab draw.   1038 s/w [facility] short stay and they can work her in if needed.   Pt stated she will get to [facility] about 1245, s/w short stay to give them a time frame.

## 2017-05-22 NOTE — ED Notes (Signed)
Report given to Sunday Spillers as I was about to head to floor.

## 2017-05-22 NOTE — ED Notes (Signed)
Date and time results received: 05/22/17 6:10 PM  (use smartphrase ".now" to insert current time)  Test: platelet Critical Value: 17  Name of Provider Notified: Dr. Clearnce Hasten  Orders Received? Or Actions Taken?: Orders Received - See Orders for details

## 2017-05-22 NOTE — H&P (Addendum)
[place] at [place] NAME: [name]    MR#:  [id number]  DATE OF BIRTH:  1951/05/30  DATE OF ADMISSION:  05/22/2017  PRIMARY CARE PHYSICIAN: Arnetha Courser, MD   REQUESTING/REFERRING PHYSICIAN:   CHIEF COMPLAINT:   Chief Complaint  Patient presents with  . Weakness    HISTORY OF PRESENT ILLNESS: [name]  is a 66 y.o. female with a known history of Stroke, hypertension, gastroesophageal reflux disease, diabetes, CAD stage III, chronic hypokalemia, CHF, metastatic breast cancer, age of ablation, permanent pacemaker placement, who presents to the hospital with complaints of weakness, headaches, left facial numbness, which is going on and off for the past 3 days. She was noted to be anemic and thrombocytopenic. Hospitalist services were contacted for admission. She has noticed some hematuria and rectal bleeding on and off over the past one week, also on and off bleeding from tracheostomy tube for the past 6 weeks, none over the past 3 days. . The patient apparently was diagnosed with subdural hematoma around left frontal lobe on 05/13/2017, she was given Keppra for 1 week. Repeated CT scan today shows no subdural hematoma.  PAST MEDICAL HISTORY:   Past Medical History:  Diagnosis Date  . 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 Anticoagulated on Coumadin   . Arthritis   . Atherosclerosis of coronary artery of native heart without angina pectoris   . Atrial fibrillation, chronic ([place])   . 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 Breast cancer ([place]) 06/21/2016   Right breast  . CHF (congestive heart failure) ([place])   . Chronic hypokalemia   . Chronic kidney disease    stage III  . Chronic pain due to trauma   . Difficult intubation    permanent tracheostomy;lungs filled with water when extubated  . Frequent headaches   . GERD (gastroesophageal reflux disease)   . History of stomach ulcers   . Hypertension   . Hypertension, goal below  140/90   . Myocardial infarction ([place])   . PONV (postoperative nausea and vomiting)   . 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 Spinal headache   . Stroke [facility])    ?per patient; history transverse venous sinus thrombosis 2005 (evaluated at [facility]; lifelong anticoagulation recommended)  . Trachea, ring   . Tracheostomy in place [facility])   . Urinary tract infection, recurrent   . Urine incontinence     PAST SURGICAL HISTORY: Past Surgical History:  Procedure Laterality Date  . ABDOMINAL HYSTERECTOMY    . BACK SURGERY    . BLADDER SURGERY     hitch  . CHOLECYSTECTOMY    . FRACTURE SURGERY     B/L feet and right ankle  . GANGLION CYST EXCISION  2011  . MASTECTOMY MODIFIED RADICAL Bilateral 12/13/2016   Procedure: RIGHT MODIFIED RADICAL MASTECTOMY, LEFT SIMPLE MASTECTOMY;  Surgeon: Coralie Keens, MD;  Location: [place];  Service: General;  Laterality: Bilateral;  . PACEMAKER INSERTION  10/29/2005  . PACEMAKER PLACEMENT  08/22/2012  . PORT A CATH INJECTION ([place] HX)  07/18/2016  . PORTACATH PLACEMENT Right 07/18/2016   Procedure: INSERTION PORT-A-CATH;  Surgeon: Coralie Keens, MD;  Location: [place];  Service: General;  Laterality: Right;  . TRACHEAL SURGERY  2000  . UVULOPALATOPHARYNGOPLASTY      SOCIAL HISTORY:  Social History  Substance Use Topics  . Smoking status: Current Some Day Smoker    Years: 40.00  Types: Cigarettes  . Smokeless tobacco: Never Used     Comment: 3 a week.  pt can't remember when she had one last  . Alcohol use No    FAMILY HISTORY:  Family History  Problem Relation Age of Onset  . Heart disease Mother   . Hypertension Mother   . Anemia Mother   . Heart disease Father   . Hypertension Father   . Anemia Father   . Cancer Unknown   . Heart disease Sister   . Heart disease Brother   . Mental illness Brother     DRUG ALLERGIES:  Allergies  Allergen Reactions  . Lisinopril Cough  . Eggs Or Egg-Derived Products Rash    Raw eggs  ONLY (boiled, scrambled, etc)  If baked into something it is NOT a issue    Review of Systems  Constitutional: Positive for chills and malaise/fatigue. Negative for fever and weight loss.  HENT: Negative for congestion.   Eyes: Negative for blurred vision and double vision.  Respiratory: Positive for cough and hemoptysis. Negative for sputum production, shortness of breath and wheezing.   Cardiovascular: Positive for palpitations. Negative for chest pain, orthopnea, leg swelling and PND.  Gastrointestinal: Positive for blood in stool. Negative for abdominal pain, constipation, diarrhea, nausea and vomiting.  Genitourinary: Positive for hematuria. Negative for dysuria, frequency and urgency.  Musculoskeletal: Negative for falls.  Neurological: Positive for sensory change. Negative for dizziness, tremors, focal weakness and headaches.  Endo/Heme/Allergies: Does not bruise/bleed easily.  Psychiatric/Behavioral: Negative for depression. The patient does not have insomnia.     MEDICATIONS AT HOME:  Prior to Admission medications   Medication Sig Start Date End Date Taking? Authorizing Provider  cholecalciferol (VITAMIN D) 1000 UNITS tablet Take 1,000 Units by mouth daily.    [provider]  gabapentin (NEURONTIN) 300 MG capsule Take 1 capsule (300 mg total) by mouth 2 (two) times daily. 05/18/17   Mariel Aloe, MD  latanoprost (XALATAN) 0.005 % ophthalmic solution Place 1 drop into both eyes at bedtime.  05/04/10   [provider]  letrozole (FEMARA) 2.5 MG tablet Take 1 tablet (2.5 mg total) by mouth daily. 12/24/16   Nicholas Lose, MD  levETIRAcetam (KEPPRA) 500 MG tablet Take 1 tablet (500 mg total) by mouth 2 (two) times daily. 05/18/17   Mariel Aloe, MD  lidocaine-prilocaine (EMLA) cream Apply to affected area once Patient taking differently: Apply 1 application topically daily as needed (prior to port access). Apply to affected area once 07/05/16   Nicholas Lose, MD    losartan (COZAAR) 100 MG tablet Take 1 tablet (100 mg total) by mouth daily. Patient taking differently: Take 100 mg by mouth at bedtime.  08/02/16   Lada, Satira Anis, MD  ondansetron (ZOFRAN) 8 MG tablet Take 1 tablet (8 mg total) by mouth 2 (two) times daily as needed for refractory nausea / vomiting. Start on day 3 after chemo. 07/05/16   Nicholas Lose, MD  oxyCODONE (OXY IR/ROXICODONE) 5 MG immediate release tablet Take 1-2 tablets (5-10 mg total) by mouth every 4 (four) hours as needed for moderate pain. 12/18/16   Coralie Keens, MD  pantoprazole (PROTONIX) 40 MG tablet TAKE 1 TABLET BY MOUTH EVERY DAY 02/25/17   Lada, Satira Anis, MD  potassium chloride (KLOR-CON M10) 10 MEQ tablet Take 1 tablet (10 mEq total) by mouth 3 (three) times daily. 07/06/16   Arnetha Courser, MD  prochlorperazine (COMPAZINE) 10 MG tablet Take 1 tablet (10 mg total)  by mouth every 6 (six) hours as needed for nausea or vomiting. 07/17/16   Nicholas Lose, MD  tizanidine (ZANAFLEX) 2 MG capsule Take 1-2 capsules (2-4 mg total) by mouth every 8 (eight) hours as needed for muscle spasms. 01/04/17   Arnetha Courser, MD  triamcinolone cream (KENALOG) 0.1 % Apply 1 application topically 2 (two) times daily. If needed Patient not taking: Reported on 04/26/2017 11/13/16   Arnetha Courser, MD      PHYSICAL EXAMINATION:   VITAL SIGNS: Blood pressure (!) 174/98, pulse 86, temperature 98.7 F (37.1 C), resp. rate (!) 26, height 5\' 7"  (1.702 m), weight 106.6 kg (235 lb), SpO2 96 %.  GENERAL:  66 y.o.-year-old patient lying in the bed with no acute distress. Intermittently coughing clear pinkish color phlegm EYES: Pupils equal, round, reactive to light and accommodation. No scleral icterus. Extraocular muscles intact.  HEENT: Head atraumatic, normocephalic. Oropharynx and nasopharynx clear.  NECK:  Supple, no jugular venous distention. No thyroid enlargement, no tenderness.  LUNGS: Normal breath sounds bilaterally, no wheezing, few  scattered rales,rhonchi and crepitations noted bilaterally. No use of accessory muscles of respiration. Tracheostomy tube with scant amount of clear phlegm, pinkish color CARDIOVASCULAR: S1, S2 normal. No murmurs, rubs, or gallops.  ABDOMEN: Soft, nontender, nondistended. Bowel sounds present. No organomegaly or mass.  EXTREMITIES: No pedal edema, cyanosis, or clubbing.  NEUROLOGIC: Cranial nerves II through XII are intact. Muscle strength 5/5 in all extremities. Sensation intact. Gait not checked.  PSYCHIATRIC: The patient is alert and oriented x 3.  SKIN: No obvious rash, lesion, or ulcer.   LABORATORY PANEL:   CBC  Recent Labs Lab 05/16/17 0620 05/16/17 1730 05/17/17 0536 05/18/17 0442 05/22/17 1334  WBC 6.3 6.4 6.4 6.3 4.8  HGB 8.0* 8.1* 8.1* 7.8* 7.6*  HCT 24.8* 25.2* 24.7* 24.4* 23.1*  PLT 50* 65* 59* 36* 16*  MCV 91.2 90.0 90.8 91.0 88.8  MCH 29.4 28.9 29.8 29.1 29.4  MCHC 32.3 32.1 32.8 32.0 33.1  RDW 19.5* 19.0* 19.2* 19.1* 18.9*  LYMPHSABS  --  2.2  --   --   --   MONOABS  --  0.4  --   --   --   EOSABS  --  0.1  --   --   --   BASOSABS  --  0.0  --   --   --    ------------------------------------------------------------------------------------------------------------------  Chemistries   Recent Labs Lab 05/16/17 0620  05/18/17 0442 05/22/17 1334  NA 136  < > 136 137  K 3.9  < > 3.7 3.9  CL 103  < > 103 106  CO2 23  < > 21* 23  GLUCOSE 99  < > 122* 95  BUN 20  < > 24* 23*  CREATININE 1.70*  < > 1.61* 1.77*  CALCIUM 10.6*  < > 10.6* 10.9*  MG  --   < > 2.2  --   AST 227*  --   --   --   ALT 47  --   --   --   ALKPHOS 125  --   --   --   BILITOT 1.2  --   --   --   < > = values in this interval not displayed. ------------------------------------------------------------------------------------------------------------------  Cardiac Enzymes No results for input(s): TROPONINI in the last 168  hours. ------------------------------------------------------------------------------------------------------------------  RADIOLOGY: Ct Head Wo Contrast  Addendum Date: 05/22/2017   ADDENDUM REPORT: 05/22/2017 17:54 ADDENDUM: A more recent comparison examination from  05/13/2017 has now been made available. This was not available at the time of original interpretation. The small subdural along the falx cerebrum on the left as well as along the left frontal lobe has resolved in the interval from the prior exam. The previously seen posterior parietal subdural collection on the left has resolved with no significant residual component. There is some questionable prominence of the CSF space in the left frontal region best seen on image number 16 of series 2 which may represent a more chronic subdural hygroma. This was not well appreciated on the prior exam but has no acute component. No definitive acute hemorrhage is seen. These results were discussed by telephone at the time of interpretation on 05/22/2017 at 5:54 pm to Dr. Larae Grooms , who verbally acknowledged these results. Electronically Signed   By: Inez Catalina M.D.   On: 05/22/2017 17:54   Result Date: 05/22/2017 CLINICAL DATA:  Facial numbness EXAM: CT HEAD WITHOUT CONTRAST TECHNIQUE: Contiguous axial images were obtained from the base of the skull through the vertex without intravenous contrast. COMPARISON:  12/10/2008 FINDINGS: Brain: No evidence of acute infarction, hemorrhage, hydrocephalus, extra-axial collection or mass lesion/mass effect. Cavum septum pellucidum is noted. Vascular: No hyperdense vessel or unexpected calcification. Skull: Normal. Negative for fracture or focal lesion. Sinuses/Orbits: No acute finding. Other: None. IMPRESSION: No acute abnormality noted. Electronically Signed: By: Inez Catalina M.D. On: 05/22/2017 17:29    EKG: Orders placed or performed during the hospital encounter of 05/22/17  . ED EKG  . ED EKG  EKG in  the emergency room reveals sinus rhythm at 89 bpm, normal axis, nonspecific ST-T changes  IMPRESSION AND PLAN:  Active Problems:   Thrombocytopenia ([name])   Facial numbness  #1. Thrombocytopenia, transfuse patient with 1 unit of platelets, follow platelet count transfusion #2. Anemia, status post 1 unit of packed red blood cell transfusion, follow hemoglobin level and transfuse as needed #3. Left-sided intermittent facial numbness, could be related to anemia versus hypertensive encephalopathy, treat and follow clinically, CT of head was unremarkable, get nephrologist involved for further recommendations, resume Keppra. Not able to use aspirin due to thrombocytopenia #4. CK D stage III, follow with therapy #5. Pyuria, concerning for urinary tract infection, initiate patient on Rocephin intravenously after urinary cultures are taken #6. Malignant essential hypertension, continue patient on Cozaar, add labetalol, 1 dose intravenously now. All the records are reviewed and case discussed with ED provider. Management plans discussed with the patient, family and they are in agreement.  CODE STATUS: Code Status History    Date Active Date Inactive Code Status Order ID Comments User Context   05/13/2017  9:35 AM 05/18/2017 10:33 PM Full Code [id number]  Waldemar Dickens, MD Inpatient   12/13/2016  4:49 PM 12/18/2016  3:34 PM Full Code [id number]  Coralie Keens, MD Inpatient   09/25/2016 10:22 PM 09/26/2016  1:49 PM Full Code [id number]  Fritzi Mandes, MD Inpatient   07/18/2016  4:54 PM 07/19/2016  1:27 PM Full Code [id number]  Coralie Keens, MD Inpatient       TOTAL TIME TAKING CARE OF THIS PATIENT: 50 minutes.    Theodoro Grist M.D on 05/22/2017 at 6:19 PM  Between 7am to 6pm - Pager - [phone] After 6pm go to [email or web address] - password [email or web address] [place] [facility]  Office  [phone]  CC: Primary care physician; Arnetha Courser, MD

## 2017-05-22 NOTE — ED Notes (Signed)
Patient transported to CT 

## 2017-05-23 ENCOUNTER — Other Ambulatory Visit: Payer: Self-pay | Admitting: Oncology

## 2017-05-23 ENCOUNTER — Observation Stay: Payer: PPO

## 2017-05-23 ENCOUNTER — Encounter: Payer: Self-pay | Admitting: Internal Medicine

## 2017-05-23 DIAGNOSIS — M352 Behcet's disease: Secondary | ICD-10-CM

## 2017-05-23 DIAGNOSIS — Z170 Estrogen receptor positive status [ER+]: Secondary | ICD-10-CM | POA: Diagnosis not present

## 2017-05-23 DIAGNOSIS — R634 Abnormal weight loss: Secondary | ICD-10-CM

## 2017-05-23 DIAGNOSIS — C773 Secondary and unspecified malignant neoplasm of axilla and upper limb lymph nodes: Secondary | ICD-10-CM

## 2017-05-23 DIAGNOSIS — R202 Paresthesia of skin: Secondary | ICD-10-CM | POA: Diagnosis not present

## 2017-05-23 DIAGNOSIS — I129 Hypertensive chronic kidney disease with stage 1 through stage 4 chronic kidney disease, or unspecified chronic kidney disease: Secondary | ICD-10-CM

## 2017-05-23 DIAGNOSIS — I6200 Nontraumatic subdural hemorrhage, unspecified: Secondary | ICD-10-CM | POA: Diagnosis not present

## 2017-05-23 DIAGNOSIS — C50411 Malignant neoplasm of upper-outer quadrant of right female breast: Secondary | ICD-10-CM | POA: Diagnosis not present

## 2017-05-23 DIAGNOSIS — Z8669 Personal history of other diseases of the nervous system and sense organs: Secondary | ICD-10-CM

## 2017-05-23 DIAGNOSIS — N183 Chronic kidney disease, stage 3 (moderate): Secondary | ICD-10-CM

## 2017-05-23 DIAGNOSIS — D649 Anemia, unspecified: Secondary | ICD-10-CM | POA: Diagnosis not present

## 2017-05-23 DIAGNOSIS — Z8711 Personal history of peptic ulcer disease: Secondary | ICD-10-CM

## 2017-05-23 DIAGNOSIS — E876 Hypokalemia: Secondary | ICD-10-CM

## 2017-05-23 DIAGNOSIS — D696 Thrombocytopenia, unspecified: Secondary | ICD-10-CM

## 2017-05-23 DIAGNOSIS — Z9221 Personal history of antineoplastic chemotherapy: Secondary | ICD-10-CM

## 2017-05-23 DIAGNOSIS — N390 Urinary tract infection, site not specified: Secondary | ICD-10-CM | POA: Diagnosis not present

## 2017-05-23 DIAGNOSIS — R51 Headache: Secondary | ICD-10-CM

## 2017-05-23 DIAGNOSIS — Z809 Family history of malignant neoplasm, unspecified: Secondary | ICD-10-CM

## 2017-05-23 DIAGNOSIS — Z7901 Long term (current) use of anticoagulants: Secondary | ICD-10-CM

## 2017-05-23 DIAGNOSIS — M7989 Other specified soft tissue disorders: Secondary | ICD-10-CM

## 2017-05-23 DIAGNOSIS — Z79899 Other long term (current) drug therapy: Secondary | ICD-10-CM

## 2017-05-23 DIAGNOSIS — R200 Anesthesia of skin: Secondary | ICD-10-CM | POA: Diagnosis not present

## 2017-05-23 DIAGNOSIS — I509 Heart failure, unspecified: Secondary | ICD-10-CM

## 2017-05-23 DIAGNOSIS — F17210 Nicotine dependence, cigarettes, uncomplicated: Secondary | ICD-10-CM

## 2017-05-23 DIAGNOSIS — R531 Weakness: Secondary | ICD-10-CM

## 2017-05-23 DIAGNOSIS — I482 Chronic atrial fibrillation: Secondary | ICD-10-CM

## 2017-05-23 DIAGNOSIS — Z9011 Acquired absence of right breast and nipple: Secondary | ICD-10-CM

## 2017-05-23 DIAGNOSIS — I252 Old myocardial infarction: Secondary | ICD-10-CM

## 2017-05-23 DIAGNOSIS — C7951 Secondary malignant neoplasm of bone: Secondary | ICD-10-CM | POA: Diagnosis not present

## 2017-05-23 DIAGNOSIS — K219 Gastro-esophageal reflux disease without esophagitis: Secondary | ICD-10-CM

## 2017-05-23 DIAGNOSIS — Z9181 History of falling: Secondary | ICD-10-CM

## 2017-05-23 DIAGNOSIS — Z87440 Personal history of urinary (tract) infections: Secondary | ICD-10-CM

## 2017-05-23 DIAGNOSIS — R32 Unspecified urinary incontinence: Secondary | ICD-10-CM

## 2017-05-23 DIAGNOSIS — M129 Arthropathy, unspecified: Secondary | ICD-10-CM

## 2017-05-23 DIAGNOSIS — Z8673 Personal history of transient ischemic attack (TIA), and cerebral infarction without residual deficits: Secondary | ICD-10-CM

## 2017-05-23 DIAGNOSIS — I2510 Atherosclerotic heart disease of native coronary artery without angina pectoris: Secondary | ICD-10-CM

## 2017-05-23 LAB — BPAM RBC
Blood Product Expiration Date: 201808202359
ISSUE DATE / TIME: 201808151501
Unit Type and Rh: 600

## 2017-05-23 LAB — CBC
HCT: 23.5 % — ABNORMAL LOW (ref 35.0–47.0)
HEMOGLOBIN: 8 g/dL — AB (ref 12.0–16.0)
MCH: 30.1 pg (ref 26.0–34.0)
MCHC: 34 g/dL (ref 32.0–36.0)
MCV: 88.6 fL (ref 80.0–100.0)
PLATELETS: 13 10*3/uL — AB (ref 150–440)
RBC: 2.65 MIL/uL — ABNORMAL LOW (ref 3.80–5.20)
RDW: 18.6 % — ABNORMAL HIGH (ref 11.5–14.5)
WBC: 6.7 10*3/uL (ref 3.6–11.0)

## 2017-05-23 LAB — TSH: TSH: 1.29 u[IU]/mL (ref 0.350–4.500)

## 2017-05-23 LAB — BASIC METABOLIC PANEL
Anion gap: 6 (ref 5–15)
BUN: 23 mg/dL — AB (ref 6–20)
CALCIUM: 10.2 mg/dL (ref 8.9–10.3)
CO2: 24 mmol/L (ref 22–32)
Chloride: 107 mmol/L (ref 101–111)
Creatinine, Ser: 1.65 mg/dL — ABNORMAL HIGH (ref 0.44–1.00)
GFR calc Af Amer: 37 mL/min — ABNORMAL LOW (ref >60)
GFR calc non Af Amer: 32 mL/min — ABNORMAL LOW (ref >60)
GLUCOSE: 124 mg/dL — AB (ref 65–99)
Potassium: 3.8 mmol/L (ref 3.5–5.1)
Sodium: 137 mmol/L (ref 135–145)

## 2017-05-23 LAB — TROPONIN I
Troponin I: <0.03 ng/mL (ref <0.03)
Troponin I: <0.03 ng/mL (ref <0.03)

## 2017-05-23 LAB — TYPE AND SCREEN
ABO/RH(D): AB POS
ANTIBODY SCREEN: NEGATIVE
Unit division: 0

## 2017-05-23 LAB — SEDIMENTATION RATE

## 2017-05-23 LAB — GLUCOSE, CAPILLARY: Glucose-Capillary: 119 mg/dL — ABNORMAL HIGH (ref 65–99)

## 2017-05-23 LAB — RETICULOCYTES
RBC.: 2.59 MIL/uL — ABNORMAL LOW (ref 3.80–5.20)
RETIC CT PCT: 2.8 % (ref 0.4–3.1)
Retic Count, Absolute: 72.5 10*3/uL (ref 19.0–183.0)

## 2017-05-23 LAB — LACTATE DEHYDROGENASE: LDH: 959 U/L — ABNORMAL HIGH (ref 98–192)

## 2017-05-23 LAB — FOLATE: Folate: 13.4 ng/mL (ref >5.9)

## 2017-05-23 LAB — PLATELET COUNT: PLATELETS: 19 10*3/uL — AB (ref 150–440)

## 2017-05-23 LAB — DAT, POLYSPECIFIC AHG (ARMC ONLY): POLYSPECIFIC AHG TEST: NEGATIVE

## 2017-05-23 LAB — VITAMIN B12: Vitamin B-12: 1022 pg/mL — ABNORMAL HIGH (ref 180–914)

## 2017-05-23 MED ORDER — LABETALOL HCL 100 MG PO TABS
100.0000 mg | ORAL_TABLET | Freq: Two times a day (BID) | ORAL | Status: DC
  Administered 2017-05-24: 100 mg via ORAL
  Filled 2017-05-23: qty 1

## 2017-05-23 MED ORDER — SODIUM CHLORIDE 0.9 % IV SOLN
Freq: Once | INTRAVENOUS | Status: AC
  Administered 2017-05-24: 06:00:00 via INTRAVENOUS

## 2017-05-23 MED ORDER — FUROSEMIDE 10 MG/ML IJ SOLN
INTRAMUSCULAR | Status: AC
  Filled 2017-05-23: qty 4

## 2017-05-23 MED ORDER — FUROSEMIDE 10 MG/ML IJ SOLN
40.0000 mg | Freq: Once | INTRAMUSCULAR | Status: AC
  Administered 2017-05-23: 09:00:00 40 mg via INTRAVENOUS

## 2017-05-23 MED ORDER — GABAPENTIN 400 MG PO CAPS
400.0000 mg | ORAL_CAPSULE | Freq: Two times a day (BID) | ORAL | Status: DC
Start: 2017-05-23 — End: 2017-05-24
  Administered 2017-05-23 – 2017-05-24 (×2): 400 mg via ORAL
  Filled 2017-05-23 (×2): qty 1

## 2017-05-23 NOTE — Care Management Note (Signed)
Case Management Note  Patient Details  Name: [name] MRN: [id number] Date of Birth: [birth year]  Subjective/Objective:                  Admitted to [facility] with the diagnosis of thrombotcypenia under observation status. Lives alone. Friend is [name] [phone]). Last seen Dr. Sanda Klein 6 weeks ago. Prescriptiond are filled at [facility]. Takes care of all basic and instrumental activities of daily living herself. Trach x 10 years. Gets supplies from [place]  Action/Plan: Discharged from [place] [facility] 05/13/17 with a non operable subdural hematoma Will continue to follow for follow-up needs.    Expected Discharge Date:  05/23/17               Expected Discharge Plan:     In-House Referral:     Discharge planning Services     Post Acute Care Choice:    Choice offered to:     DME Arranged:    DME Agency:     HH Arranged:    HH Agency:     Status of Service:     If discussed at [employer], dates discussed:    Additional Comments:  Shelbie Ammons, [place] Management [phone] 05/23/2017, 10:26 AM

## 2017-05-23 NOTE — Plan of Care (Signed)
Problem: Education: Goal: Knowledge of [place] [facility] Education information/materials will improve Outcome: Progressing Pt likes to be called [name]  PAST MEDICAL HISTORY:   Past Medical History:  Diagnosis Date  . 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 Anticoagulated on Coumadin   . Arthritis   . Atherosclerosis of coronary artery of native heart without angina pectoris   . Atrial fibrillation, chronic ([place])   . 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 Breast cancer ([place]) 06/21/2016   Right breast  . CHF (congestive heart failure) ([place])   . Chronic hypokalemia   . Chronic kidney disease    stage III  . Chronic pain due to trauma   . Difficult intubation    permanent tracheostomy;lungs filled with water when extubated  . Frequent headaches   . GERD (gastroesophageal reflux disease)   . History of stomach ulcers   . Hypertension   . Hypertension, goal below 140/90   . Myocardial infarction ([place])   . PONV (postoperative nausea and vomiting)   . 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 Spinal headache   . Stroke [facility])    ?per patient; history transverse venous sinus thrombosis 2005 (evaluated at [facility]; lifelong anticoagulation recommended)  . Trachea, ring   . Tracheostomy in place [facility])   . Urinary tract infection, recurrent   . Urine incontinence    Pt is well controlled with home medications

## 2017-05-23 NOTE — Progress Notes (Signed)
MD paged- pt forgetfulness progressing, becoming more agitated. Stating that she has "been requesting pain medication for three hours" with no receipt and that she can't have blood pressure taken in her right arm, patient provide pain medication as requested within timely manner. MD made aware of patient's agitation and current status

## 2017-05-23 NOTE — Care Management Obs Status (Signed)
[place] NOTIFICATION   Patient Details  Name: [name] MRN: [id number] Date of Birth: [birth year]   Medicare Observation Status Notification Given:  Yes    Shelbie Ammons, RN 05/23/2017, 10:35 AM

## 2017-05-23 NOTE — Progress Notes (Signed)
Patient requesting to be discharged- Dr. Darvin Neighbours made aware

## 2017-05-23 NOTE — Plan of Care (Signed)
Problem: Education: Goal: Knowledge of [place] [facility] Education information/materials will improve Outcome: Not Progressing Patient noncompliant with falls precautions. Refusing to use call bell and not wanting staff to set bed alarm. Patient educated on the need for falls precautions due to labs, medical diagnoses and medications as well as diuresis. Patient has had two episodes of incontinence and states that she is aware that she is urinating, patient continuously encouraged to call for assistance to bedside commode  Problem: Safety: Goal: Ability to remain free from injury will improve Outcome: Progressing No new injury noted this shift  Problem: Health Behavior/Discharge Planning: Goal: Ability to manage health-related needs will improve Outcome: Not Progressing Patient intermittently irritable, making intermittent bizarre or unrelated statements. Patient upset that she is not being discharge and vocalized to this nurse that she feels her HCPOA is attempting to keep her in the hospital  Problem: Pain Managment: Goal: General experience of comfort will improve Outcome: Progressing Patient medicated for pain for time one and denies any pain this time  Problem: Skin Integrity: Goal: Risk for impaired skin integrity will decrease Outcome: Progressing Patient had one small dot of blood of underpad, not other bleeding noted, unable to determine source, patient denies any hemorrhoids, no skin breakdown noted

## 2017-05-23 NOTE — Consult Note (Signed)
[facility]  Date of admission:  05/22/2017  Inpatient day:  05/23/2017  Consulting physician:  Dr. Theodoro Grist   Reason for Consultation:  Anemia and thrombocytopenia.  Chief Complaint: [name] is a 66 y.o. female with metastatic breast cancer who was admitted with weakness, anemia and thrombocytopenia.  HPI:  The patient has a history of metastatic breast cancer. She was diagnosed in 06/2016. PET scan on 07/16/2016 revealed inflammatory breast cancer, right axillary metastasis, and a T7 bone metastasis. She received neoadjuvant Taxotere and Cytoxan followed by mastectomy on 12/13/2016.  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 19 of 20 lymph nodes were positive. Tumor was ER/PR positive and HER-2/neu negative.  PET scan on 01/14/2017 revealed multifocal hypermetabolic bone metastasis involoving T7, L5 and the right inferior pubic ramus. She was started on Ibrance and letrozole on 01/21/2017.  The patient states that she received 2 months of therapy. She is been off Ibrance for over a month.  Last normal platelet count was 217,000 on 03/18/2017. Platelet count was 65,000 on 04/17/2017 and 41,000 on 04/25/2017.  She has been followed in the medical oncology clinic by Dr. Lindi Adie.  Platelet count was 19,000 on 05/01/2017. She received prednisone 60 mg with a tapering schedule. Platelet count did not respond.  She received a platelet transfusion on 05/07/2017 for a platelet count of 19,000.   The patient was admitted to [facility] from 05/13/2017 - 05/18/2017.  She presented with acute bilateral lower extremity numbness and weakness.  She describes falls prior to her admission. Imaging revealed diffuse osseous metastasis.  Head CT revealed acute small volume subdural hemorrhage along the anterior inferior falx and overlying the left anterior cerebral convexity. There was additional subacute to chronic left subdural  collection overlying the left parietal convexity without significant mass effect.  While hospitalized, she received 7 platelet transfusions and 1 unit of PRBCs.  Goal was to keep her platelet count over 50,000.  She did not respond to random donor platelets. She required match platelets.  On 05/14/2017,  platelet count increased from 5,000 to 70,000 after a transfusion of matched platelets.  Creatinine was 2.02 on admission.  The patient was discharged on 05/18/2017. She refused skilled nursing.  She was discharged home with home health and follow-up counts.  CBC on 05/18/2017 revealed a hematocrit of 24.4, hemoglobin 7.8, platelets 36,000, and white count 6300.  Creatinine was 1.61.   The patient presented to [facility] with a 3 day history of weakness, headache and intermittent left facial numbness.  CBC revealed a hematocrit of 23.1, hemoglobin 7.6, and platelets 16,000. Counts today include a hematocrit 23.5, hemoglobin 8, and platelets 13,000.  Creatinine was 1.77 on admission and is currently 1.65.  Head CT on 05/22/2017 revealed the small subdural on the falx cerebrum on the left as well as along the left frontal lobe has resolved. Previously seen posterior parietal subdural collection on the left has resolved.  There is no acute hemorrhage.  Admission notes noted some hematuria and rectal bleeding over the past week. The patient states that she has had no bleeding since admission.  Additional labs include an LDH of 959. Reticulocyte count is 2.8%. Coombs is negative. PT is 16.4 with a PTT of 36. Folate is 13.4. TSH is 1.29. Sedimentation rate is greater than 140.   Peripheral smear on 05/22/2017 revealed significant schistocytes and spherocytes. There was increased polychromasia with many nucleated red blood cells. There is left shifted neutrophils. 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 Thrombotic microangiopathy is in the  differential.  Symptomatically, the patient feels "okay".  Appetite is poor. She has some nausea.  She has lost 25 pounds since diagnosis.  She states "I'm going home tomorrow". She states that she lives alone and is able to take perform her ADLs although she relies on help from friends.  She has intermittent left facial paresthesias.   Past Medical History:  Diagnosis Date  . 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 Anticoagulated on Coumadin   . Arthritis   . Atherosclerosis of coronary artery of native heart without angina pectoris   . Atrial fibrillation, chronic ([place])   . 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 Breast cancer ([place]) 06/21/2016   Right breast  . CHF (congestive heart failure) ([place])   . Chronic hypokalemia   . Chronic kidney disease    stage III  . Chronic pain due to trauma   . Difficult intubation    permanent tracheostomy;lungs filled with water when extubated  . Frequent headaches   . GERD (gastroesophageal reflux disease)   . History of stomach ulcers   . Hypertension   . Hypertension, goal below 140/90   . Myocardial infarction ([place])   . PONV (postoperative nausea and vomiting)   . 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 Spinal headache   . Stroke [facility])    ?per patient; history transverse venous sinus thrombosis 2005 (evaluated at [facility]; lifelong anticoagulation recommended)  . Trachea, ring   . Tracheostomy in place [facility])   . Urinary tract infection, recurrent   . Urine incontinence     Past Surgical History:  Procedure Laterality Date  . ABDOMINAL HYSTERECTOMY    . BACK SURGERY    . BLADDER SURGERY     hitch  . CHOLECYSTECTOMY    . FRACTURE SURGERY     B/L feet and right ankle  . GANGLION CYST EXCISION  2011  . MASTECTOMY MODIFIED RADICAL Bilateral 12/13/2016   Procedure: RIGHT MODIFIED RADICAL MASTECTOMY, LEFT SIMPLE MASTECTOMY;  Surgeon: Coralie Keens, MD;  Location: [place];  Service: General;  Laterality: Bilateral;  . PACEMAKER INSERTION   10/29/2005  . PACEMAKER PLACEMENT  08/22/2012  . PORT A CATH INJECTION ([place] HX)  07/18/2016  . PORTACATH PLACEMENT Right 07/18/2016   Procedure: INSERTION PORT-A-CATH;  Surgeon: Coralie Keens, MD;  Location: [place];  Service: General;  Laterality: Right;  . TRACHEAL SURGERY  2000  . UVULOPALATOPHARYNGOPLASTY      Family History  Problem Relation Age of Onset  . Heart disease Mother   . Hypertension Mother   . Anemia Mother   . Heart disease Father   . Hypertension Father   . Anemia Father   . Cancer Unknown   . Heart disease Sister   . Heart disease Brother   . Mental illness Brother     Social History:  reports that she has been smoking Cigarettes.  She has smoked for the past 40.00 years. She has never used smokeless tobacco. She reports that she does not drink alcohol or use drugs.  The patient is accompanied by her friend, Loletha Carrow, and her friend, Gaspar Skeeters, who is her medical power of attorney.  Allergies:  Allergies  Allergen Reactions  . Lisinopril Cough  . Eggs Or Egg-Derived Products Rash    Raw eggs ONLY (boiled, scrambled, etc)  If baked into something it is NOT a issue  Medications Prior to Admission  Medication Sig Dispense Refill  . gabapentin (NEURONTIN) 300 MG capsule Take 1 capsule (300 mg total) by mouth 2 (two) times daily. (Patient taking differently: Take 300 mg by mouth 3 (three) times daily. )    . hydrochlorothiazide (HYDRODIURIL) 25 MG tablet Take 25 mg by mouth daily.    Marland Kitchen letrozole (FEMARA) 2.5 MG tablet Take 1 tablet (2.5 mg total) by mouth daily. 90 tablet 3  . levETIRAcetam (KEPPRA) 500 MG tablet Take 1 tablet (500 mg total) by mouth 2 (two) times daily. 3 tablet 0  . losartan (COZAAR) 100 MG tablet Take 1 tablet (100 mg total) by mouth daily. (Patient taking differently: Take 100 mg by mouth at bedtime. ) 30 tablet 5  . pantoprazole (PROTONIX) 40 MG tablet TAKE 1 TABLET BY MOUTH EVERY DAY 90 tablet 0  . potassium chloride (KLOR-CON M10) 10 MEQ  tablet Take 1 tablet (10 mEq total) by mouth 3 (three) times daily. 270 tablet 1  . warfarin (COUMADIN) 4 MG tablet Take 12 mg by mouth daily.    Marland Kitchen latanoprost (XALATAN) 0.005 % ophthalmic solution Place 1 drop into both eyes at bedtime.     . lidocaine-prilocaine (EMLA) cream Apply to affected area once (Patient taking differently: Apply 1 application topically daily as needed (prior to port access). Apply to affected area once) 30 g 3  . ondansetron (ZOFRAN) 8 MG tablet Take 1 tablet (8 mg total) by mouth 2 (two) times daily as needed for refractory nausea / vomiting. Start on day 3 after chemo. (Patient not taking: Reported on 05/23/2017) 30 tablet 1  . oxyCODONE (OXY IR/ROXICODONE) 5 MG immediate release tablet Take 1-2 tablets (5-10 mg total) by mouth every 4 (four) hours as needed for moderate pain. (Patient not taking: Reported on 05/23/2017) 40 tablet 0  . prochlorperazine (COMPAZINE) 10 MG tablet Take 1 tablet (10 mg total) by mouth every 6 (six) hours as needed for nausea or vomiting. (Patient not taking: Reported on 05/23/2017) 30 tablet 3  . tizanidine (ZANAFLEX) 2 MG capsule Take 1-2 capsules (2-4 mg total) by mouth every 8 (eight) hours as needed for muscle spasms. (Patient not taking: Reported on 05/23/2017) 40 capsule 1  . triamcinolone cream (KENALOG) 0.1 % Apply 1 application topically 2 (two) times daily. If needed (Patient not taking: Reported on 04/26/2017) 45 g 1    Review of Systems: GENERAL:  Fatigue.  No fevers or sweats.  Weight loss of 25 pounds since diagnosis. PERFORMANCE STATUS (ECOG):  2 HEENT:  No visual changes, runny nose, sore throat, mouth sores or tenderness. Lungs: No shortness of breath or cough.  No hemoptysis. Cardiac:  No chest pain, palpitations, orthopnea, or PND. GI:  Poor appetite.  Nausea.  No vomiting, diarrhea, constipation, or melena.  Blood in stool, resolved. GU:  No urgency, frequency, dysuria.  Hematuria, resolved. Musculoskeletal:  No back pain.   No joint pain.  No muscle tenderness. Extremities:  No pain or swelling. Skin:  No rashes or skin changes. Neuro:  Intermittent left facial numbness.  No headache, focal weakness, balance or coordination issues. Endocrine:  No diabetes, thyroid issues, hot flashes or night sweats. Psych:  No mood changes, depression or anxiety. Pain:  No focal pain. Review of systems:  All other systems reviewed and found to be negative.  Physical Exam:  Blood pressure 134/64, pulse 70, temperature (!) 97.4 F (36.3 C), temperature source Oral, resp. rate 20, height 5' 8"  (1.727 m), weight 224  lb 11.2 oz (101.9 kg), SpO2 95 %.  GENERAL:  Fatigued appearing woman lying comfortably on the medical unit in no acute distress. MENTAL STATUS:  Alert and oriented to person and place. HEAD:  Short gray hair.  Normocephalic, atraumatic, face symmetric, no Cushingoid features. EYES:  Prominent brown eyes with bilateral arcus.  Pupils equal round and reactive to light and accomodation.  No conjunctivitis or scleral icterus. ENT:  Oropharynx clear without lesion.  Tongue normal. Mucous membranes moist.  NECK:  Tracheostomy. RESPIRATORY:  Clear to auscultation without rales, wheezes or rhonchi. CARDIOVASCULAR:  Regular rate and rhythm without murmur, rub or gallop. ABDOMEN:  Soft, non-tender, with active bowel sounds, and no hepatosplenomegaly.  No masses. SKIN:  No rashes, ulcers or lesions. EXTREMITIES: No edema, no skin discoloration or tenderness.  No palpable cords. LYMPH NODES: No palpable cervical, supraclavicular, axillary or inguinal adenopathy  NEUROLOGICAL: Unremarkable. PSYCH:  Appropriate.   Results for orders placed or performed during the hospital encounter of 05/22/17 (from the past 48 hour(s))  Ethanol     Status: None   Collection Time: 05/22/17  5:32 PM  Result Value Ref Range   Alcohol, Ethyl (B) <5 <5 mg/dL    Comment:        LOWEST DETECTABLE LIMIT FOR SERUM ALCOHOL IS 5 mg/dL FOR MEDICAL  PURPOSES ONLY   Protime-INR     Status: Abnormal   Collection Time: 05/22/17  5:32 PM  Result Value Ref Range   Prothrombin Time 16.4 (H) 11.4 - 15.2 seconds   INR 1.31   APTT     Status: None   Collection Time: 05/22/17  5:32 PM  Result Value Ref Range   aPTT 36 24 - 36 seconds  CBC     Status: Abnormal   Collection Time: 05/22/17  5:32 PM  Result Value Ref Range   WBC 6.1 3.6 - 11.0 K/uL    Comment: ADJUSTED FOR NUCLEATED RBC'S   RBC 2.91 (L) 3.80 - 5.20 MIL/uL   Hemoglobin 8.7 (L) 12.0 - 16.0 g/dL   HCT 26.0 (L) 35.0 - 47.0 %   MCV 89.5 80.0 - 100.0 fL   MCH 30.0 26.0 - 34.0 pg   MCHC 33.5 32.0 - 36.0 g/dL   RDW 17.9 (H) 11.5 - 14.5 %   Platelets 17 (LL) 150 - 440 K/uL    Comment: PLATELET COUNT CONFIRMED BY SMEAR CRITICAL RESULT CALLED TO, READ BACK BY AND VERIFIED WITH: JANIE BOWEN AT 1809 ON 05/22/2017 JJB   Differential     Status: Abnormal   Collection Time: 05/22/17  5:32 PM  Result Value Ref Range   Neutrophils Relative % 37 %   Lymphocytes Relative 54 %   Monocytes Relative 4 %   Eosinophils Relative 0 %   Basophils Relative 0 %   Band Neutrophils 1 %   Metamyelocytes Relative 1 %   Myelocytes 3 %   Promyelocytes Absolute 0 %   Blasts 0 %   nRBC 45 (H) 0 /100 WBC   Other 0 %   Neutro Abs 2.6 1.4 - 6.5 K/uL   Lymphs Abs 3.3 1.0 - 3.6 K/uL   Monocytes Absolute 0.2 0.2 - 0.9 K/uL   Eosinophils Absolute 0.0 0 - 0.7 K/uL   Basophils Absolute 0.0 0 - 0.1 K/uL   RBC Morphology POLYCHROMASIA PRESENT     Comment: MIXED RBC POPULATION  Comprehensive metabolic panel     Status: Abnormal   Collection Time: 05/22/17  5:32 PM  Result Value Ref Range   Sodium 137 135 - 145 mmol/L   Potassium 4.1 3.5 - 5.1 mmol/L   Chloride 106 101 - 111 mmol/L   CO2 21 (L) 22 - 32 mmol/L   Glucose, Bld 100 (H) 65 - 99 mg/dL   BUN 22 (H) 6 - 20 mg/dL   Creatinine, Ser 1.63 (H) 0.44 - 1.00 mg/dL   Calcium 10.5 (H) 8.9 - 10.3 mg/dL   Total Protein 7.4 6.5 - 8.1 g/dL   Albumin 3.4  (L) 3.5 - 5.0 g/dL   AST 257 (H) 15 - 41 U/L   ALT 78 (H) 14 - 54 U/L   Alkaline Phosphatase 146 (H) 38 - 126 U/L   Total Bilirubin 1.8 (H) 0.3 - 1.2 mg/dL   GFR calc non Af Amer 32 (L) >60 mL/min   GFR calc Af Amer 37 (L) >60 mL/min    Comment: (NOTE) The eGFR has been calculated using the CKD EPI equation. This calculation has not been validated in all clinical situations. eGFR's persistently <60 mL/min signify possible Chronic Kidney Disease.    Anion gap 10 5 - 15  Urinalysis, Routine w reflex microscopic     Status: Abnormal   Collection Time: 05/22/17  5:32 PM  Result Value Ref Range   Color, Urine YELLOW (A) YELLOW   APPearance CLEAR (A) CLEAR   Specific Gravity, Urine 1.010 1.005 - 1.030   pH 6.0 5.0 - 8.0   Glucose, UA NEGATIVE NEGATIVE mg/dL   Hgb urine dipstick SMALL (A) NEGATIVE   Bilirubin Urine NEGATIVE NEGATIVE   Ketones, ur NEGATIVE NEGATIVE mg/dL   Protein, ur 30 (A) NEGATIVE mg/dL   Nitrite POSITIVE (A) NEGATIVE   Leukocytes, UA TRACE (A) NEGATIVE   RBC / HPF 0-5 0 - 5 RBC/hpf   WBC, UA 6-30 0 - 5 WBC/hpf   Bacteria, UA RARE (A) NONE SEEN   Squamous Epithelial / LPF NONE SEEN NONE SEEN   Mucous PRESENT   Troponin I     Status: Abnormal   Collection Time: 05/22/17  5:32 PM  Result Value Ref Range   Troponin I 0.03 (HH) <0.03 ng/mL    Comment: CRITICAL RESULT CALLED TO, READ BACK BY AND VERIFIED WITH JANIE BOWEN 05/22/17 @ 1833  MLK   Urine Drug Screen, Qualitative ([facility] only)     Status: None   Collection Time: 05/22/17  5:32 PM  Result Value Ref Range   Tricyclic, Ur Screen NONE DETECTED NONE DETECTED   Amphetamines, Ur Screen NONE DETECTED NONE DETECTED   MDMA (Ecstasy)Ur Screen NONE DETECTED NONE DETECTED   Cocaine Metabolite,Ur [place] NONE DETECTED NONE DETECTED   Opiate, Ur Screen NONE DETECTED NONE DETECTED   Phencyclidine (PCP) Ur S NONE DETECTED NONE DETECTED   Cannabinoid 50 Ng, Ur [place] NONE DETECTED NONE DETECTED   Barbiturates, Ur Screen NONE  DETECTED NONE DETECTED   Benzodiazepine, Ur Scrn NONE DETECTED NONE DETECTED   Methadone Scn, Ur NONE DETECTED NONE DETECTED    Comment: (NOTE) 829  Tricyclics, urine               Cutoff 1000 ng/mL 200  Amphetamines, urine             Cutoff 1000 ng/mL 300  MDMA (Ecstasy), urine           Cutoff 500 ng/mL 400  Cocaine Metabolite, urine       Cutoff 300 ng/mL 500  Opiate, urine  Cutoff 300 ng/mL 600  Phencyclidine (PCP), urine      Cutoff 25 ng/mL 700  Cannabinoid, urine              Cutoff 50 ng/mL 800  Barbiturates, urine             Cutoff 200 ng/mL 900  Benzodiazepine, urine           Cutoff 200 ng/mL 1000 Methadone, urine                Cutoff 300 ng/mL 1100 1200 The urine drug screen provides only a preliminary, unconfirmed 1300 analytical test result and should not be used for non-medical 1400 purposes. Clinical consideration and professional judgment should 1500 be applied to any positive drug screen result due to possible 1600 interfering substances. A more specific alternate chemical method 1700 must be used in order to obtain a confirmed analytical result.  1800 Gas chromato graphy / mass spectrometry (GC/MS) is the preferred 1900 confirmatory method.   Urine Culture     Status: Abnormal (Preliminary result)   Collection Time: 05/22/17  5:32 PM  Result Value Ref Range   Specimen Description URINE, RANDOM    Special Requests NONE    Culture >=100,000 COLONIES/mL GRAM NEGATIVE RODS (A)    Report Status PENDING   Type and screen [facility]     Status: None   Collection Time: 05/22/17  8:16 PM  Result Value Ref Range   ABO/RH(D) AB POS    Antibody Screen NEG    Sample Expiration 05/25/2017   Troponin I     Status: Abnormal   Collection Time: 05/22/17  8:16 PM  Result Value Ref Range   Troponin I 0.03 (HH) <0.03 ng/mL    Comment: CRITICAL VALUE NOTED. VALUE IS CONSISTENT WITH PREVIOUSLY REPORTED/CALLED VALUE / [place]  Troponin I      Status: None   Collection Time: 05/23/17 12:19 AM  Result Value Ref Range   Troponin I <0.03 <0.03 ng/mL  Troponin I     Status: None   Collection Time: 05/23/17  4:20 AM  Result Value Ref Range   Troponin I <0.03 <0.03 ng/mL  Basic metabolic panel     Status: Abnormal   Collection Time: 05/23/17  4:20 AM  Result Value Ref Range   Sodium 137 135 - 145 mmol/L   Potassium 3.8 3.5 - 5.1 mmol/L   Chloride 107 101 - 111 mmol/L   CO2 24 22 - 32 mmol/L   Glucose, Bld 124 (H) 65 - 99 mg/dL   BUN 23 (H) 6 - 20 mg/dL   Creatinine, Ser 1.65 (H) 0.44 - 1.00 mg/dL   Calcium 10.2 8.9 - 10.3 mg/dL   GFR calc non Af Amer 32 (L) >60 mL/min   GFR calc Af Amer 37 (L) >60 mL/min    Comment: (NOTE) The eGFR has been calculated using the CKD EPI equation. This calculation has not been validated in all clinical situations. eGFR's persistently <60 mL/min signify possible Chronic Kidney Disease.    Anion gap 6 5 - 15  CBC     Status: Abnormal   Collection Time: 05/23/17  4:20 AM  Result Value Ref Range   WBC 6.7 3.6 - 11.0 K/uL   RBC 2.65 (L) 3.80 - 5.20 MIL/uL   Hemoglobin 8.0 (L) 12.0 - 16.0 g/dL   HCT 23.5 (L) 35.0 - 47.0 %   MCV 88.6 80.0 - 100.0 fL   MCH 30.1 26.0 -  34.0 pg   MCHC 34.0 32.0 - 36.0 g/dL   RDW 18.6 (H) 11.5 - 14.5 %   Platelets 13 (LL) 150 - 440 K/uL    Comment: CRITICAL VALUE NOTED.  VALUE IS CONSISTENT WITH PREVIOUSLY REPORTED AND CALLED VALUE. RESULT REPEATED AND VERIFIED   DAT, polyspecific, AHG ([facility] only)     Status: None   Collection Time: 05/23/17  7:38 AM  Result Value Ref Range   Polyspecific AHG test NEG   Glucose, capillary     Status: Abnormal   Collection Time: 05/23/17  7:50 AM  Result Value Ref Range   Glucose-Capillary 119 (H) 65 - 99 mg/dL  Troponin I     Status: None   Collection Time: 05/23/17  8:37 AM  Result Value Ref Range   Troponin I <0.03 <0.03 ng/mL  Reticulocytes     Status: Abnormal   Collection Time: 05/23/17  8:37 AM  Result Value Ref  Range   Retic Ct Pct 2.8 0.4 - 3.1 %   RBC. 2.59 (L) 3.80 - 5.20 MIL/uL   Retic Count, Absolute 72.5 19.0 - 183.0 K/uL  Lactate dehydrogenase     Status: Abnormal   Collection Time: 05/23/17  8:37 AM  Result Value Ref Range   LDH 959 (H) 98 - 192 U/L   Ct Head Wo Contrast  Addendum Date: 05/22/2017   ADDENDUM REPORT: 05/22/2017 17:54 ADDENDUM: A more recent comparison examination from 05/13/2017 has now been made available. This was not available at the time of original interpretation. The small subdural along the falx cerebrum on the left as well as along the left frontal lobe has resolved in the interval from the prior exam. The previously seen posterior parietal subdural collection on the left has resolved with no significant residual component. There is some questionable prominence of the CSF space in the left frontal region best seen on image number 16 of series 2 which may represent a more chronic subdural hygroma. This was not well appreciated on the prior exam but has no acute component. No definitive acute hemorrhage is seen. These results were discussed by telephone at the time of interpretation on 05/22/2017 at 5:54 pm to Dr. Larae Grooms , who verbally acknowledged these results. Electronically Signed   By: Inez Catalina M.D.   On: 05/22/2017 17:54   Result Date: 05/22/2017 CLINICAL DATA:  Facial numbness EXAM: CT HEAD WITHOUT CONTRAST TECHNIQUE: Contiguous axial images were obtained from the base of the skull through the vertex without intravenous contrast. COMPARISON:  12/10/2008 FINDINGS: Brain: No evidence of acute infarction, hemorrhage, hydrocephalus, extra-axial collection or mass lesion/mass effect. Cavum septum pellucidum is noted. Vascular: No hyperdense vessel or unexpected calcification. Skull: Normal. Negative for fracture or focal lesion. Sinuses/Orbits: No acute finding. Other: None. IMPRESSION: No acute abnormality noted. Electronically Signed: By: Inez Catalina M.D. On:  05/22/2017 17:29   Dg Chest Port 1 View  Result Date: 05/23/2017 CLINICAL DATA:  Weakness headache and left facial numbness intermittently for the past 3 days. [place] exam. History of CHF, metastatic breast malignancy, coronary artery disease, and CVA. EXAM: PORTABLE CHEST 1 VIEW COMPARISON:  Portable chest x-ray and chest CT scan of May 13, 2017. FINDINGS: The lungs are mildly hypoinflated. The interstitial markings are mildly increased but not greatly changed from the previous study. There is no significant pleural effusion. The cardiac silhouette is enlarged. The central pulmonary vascularity is prominent. The ICD is in stable position. The tracheostomy tube tip projects at the superior margin  of the clavicular heads. The porta catheter tip projects over the proximal SVC. IMPRESSION: Mild hypoinflation. Probable low-grade compensated CHF. No acute pneumonia. Electronically Signed   By: David  Martinique M.D.   On: 05/23/2017 07:15    Assessment:  The patient is a 66 y.o. woman with metastatic breast cancer recently on Ibrance and letrozole.  She has been off Ibrance since at least 04/17/2017 when she was documented to have pancytopenia.  WBC count recovered.  She has had persistent thrombocytopenia and anemia.  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  Creatinine is stable.  There is no evidence of DIC.  She has a history of a subdural hematoma in 05/13/2017.  At that time, goal platelet count was 50,000.  She responded to matched platelets.  Head CT on 05/22/2017 reveals resolution of subdural collections.  She has no active bleeding.  Platelet count has been unresponsive to steroids.  She may have marrow involvement resulting in her peripheral blood findings.  Plan:   1.  Oncology:  Metastatic breast cancer currently on letrozole.  She initially presented with inflammatory breast cancer and T7 metastasis.  Disease progressed after neoadjuvant Taxotere and Cytoxan.  She  was subsequently treated with Ibrance and letrozole for 2 months.  Imaging on 05/13/2017 revealing widespread osseous metastatic disease.   Agree with palliative care consult.  2.  Hematology:  Patient has had thrombocytopenia since 04/17/2017.  During her recent admission to [facility], she responded only to matched platelets.  Subdural hematoma has resolved.  Platelet goal is 20,000.  Transfuse platelets if significant bleeding.  If platelet transfusion given, please obtain 1 hour post platelet count.  Maintain active type and cross.    No evidence of DIC.  [id number] pending.  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  Given possible marrow involvement resulting in peripheral blood findings and lack of count recovery, discussed bone marrow aspirate and biopsy with patient.  3.  Disposition:  Guarded.   Thank you for allowing me to participate in KEGAN SHEPARDSON 's care.  I will follow her closely with you while hospitalized.   At discharge, she will follow-up with her oncologist, Dr. Nicholas Lose, in the outpatient department.   Lequita Asal, MD  05/23/2017, 3:36 PM

## 2017-05-23 NOTE — Consult Note (Signed)
Reason for Consult:Paresthesias Referring Physician: Sudini  CC: Paresthesias  HPI: [name] is an 66 y.o. female with multiple medical problems including a recent SDH who presents with complaints of paresthesias.  Patient reports that she has had intermittent paresthesias in her bilateral hand and feet for the past few years.  In the past 3-4 days they have become more frequent and prominent.  Has also noted paresthesias that are intermittent on the left side of her face.  Mostly in the cheek and lower jaw.  Symptoms last for a few minutes to a few hours and may occur once to multiple times per day.    Past Medical History:  Diagnosis Date  . 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 Anticoagulated on Coumadin   . Arthritis   . Atherosclerosis of coronary artery of native heart without angina pectoris   . Atrial fibrillation, chronic ([place])   . 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 Breast cancer ([place]) 06/21/2016   Right breast  . CHF (congestive heart failure) ([place])   . Chronic hypokalemia   . Chronic kidney disease    stage III  . Chronic pain due to trauma   . Difficult intubation    permanent tracheostomy;lungs filled with water when extubated  . Frequent headaches   . GERD (gastroesophageal reflux disease)   . History of stomach ulcers   . Hypertension   . Hypertension, goal below 140/90   . Myocardial infarction ([place])   . PONV (postoperative nausea and vomiting)   . 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 Spinal headache   . Stroke [facility])    ?per patient; history transverse venous sinus thrombosis 2005 (evaluated at [facility]; lifelong anticoagulation recommended)  . Trachea, ring   . Tracheostomy in place [facility])   . Urinary tract infection, recurrent   . Urine incontinence     Past Surgical History:  Procedure Laterality Date  . ABDOMINAL HYSTERECTOMY    . BACK SURGERY    . BLADDER SURGERY     hitch  . CHOLECYSTECTOMY    . FRACTURE SURGERY     B/L feet and right  ankle  . GANGLION CYST EXCISION  2011  . MASTECTOMY MODIFIED RADICAL Bilateral 12/13/2016   Procedure: RIGHT MODIFIED RADICAL MASTECTOMY, LEFT SIMPLE MASTECTOMY;  Surgeon: Coralie Keens, MD;  Location: [place];  Service: General;  Laterality: Bilateral;  . PACEMAKER INSERTION  10/29/2005  . PACEMAKER PLACEMENT  08/22/2012  . PORT A CATH INJECTION ([place] HX)  07/18/2016  . PORTACATH PLACEMENT Right 07/18/2016   Procedure: INSERTION PORT-A-CATH;  Surgeon: Coralie Keens, MD;  Location: [place];  Service: General;  Laterality: Right;  . TRACHEAL SURGERY  2000  . UVULOPALATOPHARYNGOPLASTY      Family History  Problem Relation Age of Onset  . Heart disease Mother   . Hypertension Mother   . Anemia Mother   . Heart disease Father   . Hypertension Father   . Anemia Father   . Cancer Unknown   . Heart disease Sister   . Heart disease Brother   . Mental illness Brother     Social History:  reports that she has been smoking Cigarettes.  She has smoked for the past 40.00 years. She has never used smokeless tobacco. She reports that she does not drink alcohol or use drugs.  Allergies  Allergen Reactions  . Lisinopril Cough  . Eggs Or Egg-Derived Products Rash    Raw eggs ONLY (boiled, scrambled,  etc)  If baked into something it is NOT a issue    Medications:  I have reviewed the patient's current medications. Prior to Admission:  Prescriptions Prior to Admission  Medication Sig Dispense Refill Last Dose  . cholecalciferol (VITAMIN D) 1000 UNITS tablet Take 1,000 Units by mouth daily.   05/22/2017 at Unknown time  . gabapentin (NEURONTIN) 300 MG capsule Take 1 capsule (300 mg total) by mouth 2 (two) times daily.   05/22/2017 at Unknown time  . latanoprost (XALATAN) 0.005 % ophthalmic solution Place 1 drop into both eyes at bedtime.    05/22/2017 at Unknown time  . letrozole (FEMARA) 2.5 MG tablet Take 1 tablet (2.5 mg total) by mouth daily. 90 tablet 3 05/22/2017 at Unknown time  .  levETIRAcetam (KEPPRA) 500 MG tablet Take 1 tablet (500 mg total) by mouth 2 (two) times daily. 3 tablet 0 05/22/2017 at Unknown time  . lidocaine-prilocaine (EMLA) cream Apply to affected area once (Patient taking differently: Apply 1 application topically daily as needed (prior to port access). Apply to affected area once) 30 g 3 unk  . losartan (COZAAR) 100 MG tablet Take 1 tablet (100 mg total) by mouth daily. (Patient taking differently: Take 100 mg by mouth at bedtime. ) 30 tablet 5 05/12/2017 at Unknown time  . ondansetron (ZOFRAN) 8 MG tablet Take 1 tablet (8 mg total) by mouth 2 (two) times daily as needed for refractory nausea / vomiting. Start on day 3 after chemo. 30 tablet 1 unk  . oxyCODONE (OXY IR/ROXICODONE) 5 MG immediate release tablet Take 1-2 tablets (5-10 mg total) by mouth every 4 (four) hours as needed for moderate pain. 40 tablet 0 unk  . pantoprazole (PROTONIX) 40 MG tablet TAKE 1 TABLET BY MOUTH EVERY DAY 90 tablet 0 05/22/2017 at Unknown time  . potassium chloride (KLOR-CON M10) 10 MEQ tablet Take 1 tablet (10 mEq total) by mouth 3 (three) times daily. 270 tablet 1 05/22/2017 at Unknown time  . prochlorperazine (COMPAZINE) 10 MG tablet Take 1 tablet (10 mg total) by mouth every 6 (six) hours as needed for nausea or vomiting. 30 tablet 3 unk  . tizanidine (ZANAFLEX) 2 MG capsule Take 1-2 capsules (2-4 mg total) by mouth every 8 (eight) hours as needed for muscle spasms. 40 capsule 1 unk  . triamcinolone cream (KENALOG) 0.1 % Apply 1 application topically 2 (two) times daily. If needed (Patient not taking: Reported on 04/26/2017) 45 g 1 Not Taking at Unknown time   Scheduled: . gabapentin  300 mg Oral BID  . [START ON 05/24/2017] labetalol  100 mg Oral BID  . labetalol  10 mg Intravenous Once  . latanoprost  1 drop Both Eyes QHS  . letrozole  2.5 mg Oral Daily  . levETIRAcetam  500 mg Oral BID  . losartan  100 mg Oral Daily  . pantoprazole  40 mg Oral Daily  . potassium chloride   10 mEq Oral TID  . sodium chloride flush  3 mL Intravenous Q12H  . sodium chloride flush  3 mL Intravenous Q12H    ROS: History obtained from the patient  General ROS: negative for - chills, fatigue, fever, night sweats, weight gain or weight loss Psychological ROS: negative for - behavioral disorder, hallucinations, memory difficulties, mood swings or suicidal ideation Ophthalmic ROS: negative for - blurry vision, double vision, eye pain or loss of vision ENT ROS: negative for - epistaxis, nasal discharge, oral lesions, sore throat, tinnitus or vertigo Allergy and Immunology  ROS: negative for - hives or itchy/watery eyes Hematological and Lymphatic ROS: negative for - bleeding problems, bruising or swollen lymph nodes Endocrine ROS: negative for - galactorrhea, hair pattern changes, polydipsia/polyuria or temperature intolerance Respiratory ROS: negative for - cough, hemoptysis, shortness of breath or wheezing Cardiovascular ROS: negative for - chest pain, dyspnea on exertion, edema or irregular heartbeat Gastrointestinal ROS: blood in stools Genito-Urinary ROS: urinary incontinence Musculoskeletal ROS: negative for - joint swelling or muscular weakness Neurological ROS: as noted in HPI, headaches Dermatological ROS: negative for rash and skin lesion changes  Physical Examination: Blood pressure 102/65, pulse 74, temperature 98 F (36.7 C), temperature source Oral, resp. rate 18, height 5' 8"  (1.727 m), weight 101.9 kg (224 lb 11.2 oz), SpO2 99 %.  Gen: NAD HEENT-  Normocephalic, no lesions, without obvious abnormality.  Bilateral proptosis.  Normal TM's bilaterally.  Normal auditory canals and external ears. Normal external nose, mucus membranes and septum.  Trach in place. Cardiovascular- S1, S2 normal, pulses palpable throughout   Lungs- chest clear, no wheezing, rales, normal symmetric air entry Abdomen- soft, non-tender; bowel sounds normal; no masses,  no  organomegaly Extremities- no edema Lymph-no adenopathy palpable Musculoskeletal-no joint tenderness, deformity or swelling Skin-warm and dry, no hyperpigmentation, vitiligo, or suspicious lesions  Neurological Examination   Mental Status: Alert, oriented, thought content appropriate.  Speech fluent without evidence of aphasia.  Able to follow 3 step commands without difficulty. Cranial Nerves: II: Discs flat bilaterally; Visual fields grossly normal, pupils equal, round, reactive to light and accommodation III,IV, VI: ptosis not present, extra-ocular motions intact bilaterally V,VII: smile symmetric, facial light touch sensation normal bilaterally VIII: hearing normal bilaterally IX,X: gag reflex present XI: bilateral shoulder shrug XII: midline tongue extension Motor: 5/5 throughout with some mild hip flexor strength weakness bilaterally Sensory: Pinprick and light touch intact throughout, bilaterally Deep Tendon Reflexes: 2+ and symmetric with absent AJ's bilaterally Plantars: Right: downgoing   Left: downgoing Cerebellar: Normal finger-to-nose and normal heel-to-shin testing bilaterally Gait: not tested due to safety concerns    Laboratory Studies:   Basic Metabolic Panel:  Recent Labs Lab 05/17/17 0536 05/18/17 0442 05/22/17 1334 05/22/17 1732 05/23/17 0420  NA 136 136 137 137 137  K 3.9 3.7 3.9 4.1 3.8  CL 103 103 106 106 107  CO2 24 21* 23 21* 24  GLUCOSE 97 122* 95 100* 124*  BUN 22* 24* 23* 22* 23*  CREATININE 1.69* 1.61* 1.77* 1.63* 1.65*  CALCIUM 10.7* 10.6* 10.9* 10.5* 10.2  MG 2.1 2.2  --   --   --     Liver Function Tests:  Recent Labs Lab 05/22/17 1732  AST 257*  ALT 78*  ALKPHOS 146*  BILITOT 1.8*  PROT 7.4  ALBUMIN 3.4*   No results for input(s): LIPASE, AMYLASE in the last 168 hours. No results for input(s): AMMONIA in the last 168 hours.  CBC:  Recent Labs Lab 05/16/17 1730 05/17/17 0536 05/18/17 0442 05/22/17 1334  05/22/17 1732 05/23/17 0420  WBC 6.4 6.4 6.3 4.8 6.1 6.7  NEUTROABS 3.7  --   --   --  2.6  --   HGB 8.1* 8.1* 7.8* 7.6* 8.7* 8.0*  HCT 25.2* 24.7* 24.4* 23.1* 26.0* 23.5*  MCV 90.0 90.8 91.0 88.8 89.5 88.6  PLT 65* 59* 36* 16* 17* 13*    Cardiac Enzymes:  Recent Labs Lab 05/22/17 1732 05/22/17 2016 05/23/17 0019 05/23/17 0420 05/23/17 0837  TROPONINI 0.03* 0.03* <0.03 <0.03 <0.03    BNP:  Invalid input(s): POCBNP  CBG:  Recent Labs Lab 05/17/17 2159 05/18/17 0613 05/18/17 1204 05/18/17 1623 05/23/17 0750  GLUCAP 90 123* 109* 106* 119*    Microbiology: Results for orders placed or performed during the hospital encounter of 05/13/17  MRSA PCR Screening     Status: None   Collection Time: 05/13/17  9:25 AM  Result Value Ref Range Status   MRSA by PCR NEGATIVE NEGATIVE Final    Comment:        The GeneXpert MRSA Assay (FDA approved for NASAL specimens only), is one component of a comprehensive MRSA colonization surveillance program. It is not intended to diagnose MRSA infection nor to guide or monitor treatment for MRSA infections.     Coagulation Studies:  Recent Labs  05/22/17 1732  LABPROT 16.4*  INR 1.31    Urinalysis:  Recent Labs Lab 05/22/17 1732  COLORURINE YELLOW*  LABSPEC 1.010  PHURINE 6.0  GLUCOSEU NEGATIVE  HGBUR SMALL*  BILIRUBINUR NEGATIVE  KETONESUR NEGATIVE  PROTEINUR 30*  NITRITE POSITIVE*  LEUKOCYTESUR TRACE*    Lipid Panel:  No results found for: CHOL, TRIG, HDL, CHOLHDL, VLDL, LDLCALC  HgbA1C:  Lab Results  Component Value Date   HGBA1C 6.8 (H) 05/15/2017    Urine Drug Screen:     Component Value Date/Time   LABOPIA NONE DETECTED 05/22/2017 1732   COCAINSCRNUR NONE DETECTED 05/22/2017 1732   LABBENZ NONE DETECTED 05/22/2017 1732   AMPHETMU NONE DETECTED 05/22/2017 1732   THCU NONE DETECTED 05/22/2017 1732   LABBARB NONE DETECTED 05/22/2017 1732    Alcohol Level:  Recent Labs Lab 05/22/17 [place]  <5    Other results: EKG (05/13/2017): sinus rhythm at 90 bpm.  Imaging: Ct Head Wo Contrast  Addendum Date: 05/22/2017   ADDENDUM REPORT: 05/22/2017 17:54 ADDENDUM: A more recent comparison examination from 05/13/2017 has now been made available. This was not available at the time of original interpretation. The small subdural along the falx cerebrum on the left as well as along the left frontal lobe has resolved in the interval from the prior exam. The previously seen posterior parietal subdural collection on the left has resolved with no significant residual component. There is some questionable prominence of the CSF space in the left frontal region best seen on image number 16 of series 2 which may represent a more chronic subdural hygroma. This was not well appreciated on the prior exam but has no acute component. No definitive acute hemorrhage is seen. These results were discussed by telephone at the time of interpretation on 05/22/2017 at 5:54 pm to Dr. Larae Grooms , who verbally acknowledged these results. Electronically Signed   By: Inez Catalina M.D.   On: 05/22/2017 17:54   Result Date: 05/22/2017 CLINICAL DATA:  Facial numbness EXAM: CT HEAD WITHOUT CONTRAST TECHNIQUE: Contiguous axial images were obtained from the base of the skull through the vertex without intravenous contrast. COMPARISON:  12/10/2008 FINDINGS: Brain: No evidence of acute infarction, hemorrhage, hydrocephalus, extra-axial collection or mass lesion/mass effect. Cavum septum pellucidum is noted. Vascular: No hyperdense vessel or unexpected calcification. Skull: Normal. Negative for fracture or focal lesion. Sinuses/Orbits: No acute finding. Other: None. IMPRESSION: No acute abnormality noted. Electronically Signed: By: Inez Catalina M.D. On: 05/22/2017 17:29   Dg Chest Port 1 View  Result Date: 05/23/2017 CLINICAL DATA:  Weakness headache and left facial numbness intermittently for the past 3 days. [place] exam. History of  CHF, metastatic breast malignancy, coronary artery disease, and CVA. EXAM:  PORTABLE CHEST 1 VIEW COMPARISON:  Portable chest x-ray and chest CT scan of May 13, 2017. FINDINGS: The lungs are mildly hypoinflated. The interstitial markings are mildly increased but not greatly changed from the previous study. There is no significant pleural effusion. The cardiac silhouette is enlarged. The central pulmonary vascularity is prominent. The ICD is in stable position. The tracheostomy tube tip projects at the superior margin of the clavicular heads. The porta catheter tip projects over the proximal SVC. IMPRESSION: Mild hypoinflation. Probable low-grade compensated CHF. No acute pneumonia. Electronically Signed   By: David  Martinique M.D.   On: 05/23/2017 07:15     Assessment/Plan: 66 year old female presenting with intermittent paresthesias.  Has progressed from involving the extremities to involving the face as well.  Doubt these are ischemic in etiology.  Patient previously on anticoagulation but with recent SDH.  Now also with low platelets as well so not a candidate at this time to return to anticoagulation or start antiplatelet therapy.  Head CT reviewed and shows no acute changes.  Patient unable to have MRI due to pacer.   Patient on Neurontin and although hepatic function declining, renal function appears to have been stable.    Recommendations: 1.  Increase Neurontin to 428m BID 2.  B12, folate, TSH, ESR 3.  Patient to follow up with neurology after discharge for possible NCV/EMG    LAlexis Goodell MD Neurology [phone]/16/2018, 12:27 PM

## 2017-05-23 NOTE — Evaluation (Signed)
Clinical/Bedside Swallow Evaluation Patient Details  Name: [name] MRN: [id number] Date of Birth: 1950/11/05  Today's Date: 05/23/2017 Time: SLP Start Time (ACUTE ONLY): 0945 SLP Stop Time (ACUTE ONLY): 0955 SLP Time Calculation (min) (ACUTE ONLY): 10 min  Past Medical History:  Past Medical History:  Diagnosis Date  . 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 Anticoagulated on Coumadin   . Arthritis   . Atherosclerosis of coronary artery of native heart without angina pectoris   . Atrial fibrillation, chronic ([place])   . 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 Breast cancer ([place]) 06/21/2016   Right breast  . CHF (congestive heart failure) ([place])   . Chronic hypokalemia   . Chronic kidney disease    stage III  . Chronic pain due to trauma   . Difficult intubation    permanent tracheostomy;lungs filled with water when extubated  . Frequent headaches   . GERD (gastroesophageal reflux disease)   . History of stomach ulcers   . Hypertension   . Hypertension, goal below 140/90   . Myocardial infarction ([place])   . PONV (postoperative nausea and vomiting)   . 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 Spinal headache   . Stroke [facility])    ?per patient; history transverse venous sinus thrombosis 2005 (evaluated at [facility]; lifelong anticoagulation recommended)  . Trachea, ring   . Tracheostomy in place [facility])   . Urinary tract infection, recurrent   . Urine incontinence    Past Surgical History:  Past Surgical History:  Procedure Laterality Date  . ABDOMINAL HYSTERECTOMY    . BACK SURGERY    . BLADDER SURGERY     hitch  . CHOLECYSTECTOMY    . FRACTURE SURGERY     B/L feet and right ankle  . GANGLION CYST EXCISION  2011  . MASTECTOMY MODIFIED RADICAL Bilateral 12/13/2016   Procedure: RIGHT MODIFIED RADICAL MASTECTOMY, LEFT SIMPLE MASTECTOMY;  Surgeon: Coralie Keens, MD;  Location: [place];  Service: General;  Laterality: Bilateral;  . PACEMAKER INSERTION  10/29/2005  . PACEMAKER  PLACEMENT  08/22/2012  . PORT A CATH INJECTION ([place] HX)  07/18/2016  . PORTACATH PLACEMENT Right 07/18/2016   Procedure: INSERTION PORT-A-CATH;  Surgeon: Coralie Keens, MD;  Location: [place];  Service: General;  Laterality: Right;  . TRACHEAL SURGERY  2000  . UVULOPALATOPHARYNGOPLASTY     HPI:  [name]  is a 66 y.o. female with a known history of Stroke, hypertension, gastroesophageal reflux disease, diabetes, CAD stage III, chronic hypokalemia, CHF, metastatic breast cancer, age of ablation, permanent pacemaker placement, who presents to the hospital with complaints of weakness, headaches, left facial numbness, which is going on and off for the past 3 days. She was noted to be anemic and thrombocytopenic. Pt (+) permanent trach for reportedly over 16 years. The patient apparently was diagnosed with subdural hematoma around left frontal lobe on 05/13/2017.  CXR Mild hypoinflation. Probable low-grade compensated CHF. No acute pneumonia. CT Head: 1. Stable thin acute subdural hematoma along the anterior falx and over superior left paramedian frontal lobe. 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 No significant mass effect. 2. No new acute intracranial abnormality. 3. Right high parietal scalp 14 mm dermal lesion.    Assessment / Plan / Recommendation Clinical Impression  [name] is a 66 y.o. female admitted with thrombocytopenia ([place]) and facial numbness.  Pt (+) permanent trach for reportedly "over 16 years".  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  No clinical s/sx of aspiration noted with trials of thin liquid and hard solid.  Puree not trialed this date d/t pt report "I do not like applesauce".  Voice quality remained clear.  Oral motor examination was [facility].  Pt remains at minimal risk for aspiration given prolonged hx of trach and previous unilateral numbness.  Signs and symptoms of aspiration  reviwed with pt to which she expressed understanding.  Recommend regular diet with unrestricted liquids.  SLP to sign off, however remains available as indicated.      Aspiration Risk  No limitations    Diet Recommendation Regular;Thin liquid   Medication Administration: Whole meds with liquid Supervision: Patient able to self feed Postural Changes: Seated upright at 90 degrees    Other  Recommendations Oral Care Recommendations: Patient independent with oral care   Follow up Recommendations None             Prognosis Prognosis for Safe Diet Advancement: Good      Swallow Study   General Date of Onset: 05/22/17 HPI: [name]  is a 66 y.o. female with a known history of Stroke, hypertension, gastroesophageal reflux disease, diabetes, CAD stage III, chronic hypokalemia, CHF, metastatic breast cancer, age of ablation, permanent pacemaker placement, who presents to the hospital with complaints of weakness, headaches, left facial numbness, which is going on and off for the past 3 days. She was noted to be anemic and thrombocytopenic. Pt (+) permanent trach for reportedly over 16 years. The patient apparently was diagnosed with subdural hematoma around left frontal lobe on 05/13/2017.  CXR Mild hypoinflation. Probable low-grade compensated CHF. No acute pneumonia. CT Head: 1. Stable thin acute subdural hematoma along the anterior falx and over superior left paramedian frontal lobe. 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 No significant mass effect. 2. No new acute intracranial abnormality. 3. Right high parietal scalp 14 mm dermal lesion.  Type of Study: Bedside Swallow Evaluation Diet Prior to this Study: Regular Temperature Spikes Noted: N/A Respiratory Status: Room air History of Recent Intubation: No Behavior/Cognition: Cooperative;Alert Oral Cavity Assessment: Within Functional Limits Oral Care Completed by SLP: No Oral Cavity - Dentition: Adequate  natural dentition Vision: Functional for self-feeding Self-Feeding Abilities: Able to feed self Patient Positioning: Upright in bed Baseline Vocal Quality: Normal;Other (comment) (permanent trach) Volitional Swallow: Able to elicit    Oral/Motor/Sensory Function Overall Oral Motor/Sensory Function: Within functional limits      Thin Liquid Thin Liquid: Within functional limits Presentation: Cup;Straw             Solid   GO   Solid: Within functional limits Presentation: Self Fed        Diasha Castleman 05/23/2017,11:28 AM

## 2017-05-23 NOTE — Progress Notes (Signed)
[facility] NAME: [name]    MR#:  [id number]  DATE OF BIRTH:  06/16/1951  SUBJECTIVE:  CHIEF COMPLAINT:   Chief Complaint  Patient presents with  . Weakness   Wants to go home No bleeding  REVIEW OF SYSTEMS:    Review of Systems  Constitutional: Positive for malaise/fatigue. Negative for chills and fever.  HENT: Negative for sore throat.   Eyes: Negative for blurred vision, double vision and pain.  Respiratory: Negative for cough, hemoptysis, shortness of breath and wheezing.   Cardiovascular: Negative for chest pain, palpitations, orthopnea and leg swelling.  Gastrointestinal: Negative for abdominal pain, constipation, diarrhea, heartburn, nausea and vomiting.  Genitourinary: Negative for dysuria and hematuria.  Musculoskeletal: Negative for back pain and joint pain.  Skin: Negative for rash.  Neurological: Positive for sensory change. Negative for speech change, focal weakness and headaches.  Endo/Heme/Allergies: Does not bruise/bleed easily.  Psychiatric/Behavioral: Negative for depression. The patient is not nervous/anxious.     DRUG ALLERGIES:   Allergies  Allergen Reactions  . Lisinopril Cough  . Eggs Or Egg-Derived Products Rash    Raw eggs ONLY (boiled, scrambled, etc)  If baked into something it is NOT a issue    VITALS:  Blood pressure 134/64, pulse 70, temperature (!) 97.4 F (36.3 C), temperature source Oral, resp. rate 20, height 5\' 8"  (1.727 m), weight 101.9 kg (224 lb 11.2 oz), SpO2 95 %.  PHYSICAL EXAMINATION:   Physical Exam  GENERAL:  66 y.o.-year-old patient lying in the bed with no acute distress.  EYES: Pupils equal, round, reactive to light and accommodation. No scleral icterus. Extraocular muscles intact.  HEENT: Head atraumatic, normocephalic. Oropharynx and nasopharynx clear.  NECK:  Supple, no jugular venous distention. No thyroid enlargement, no tenderness. Tracheostomy in  place LUNGS: Normal breath sounds bilaterally, no wheezing, rales, rhonchi. No use of accessory muscles of respiration.  CARDIOVASCULAR: S1, S2 normal. No murmurs, rubs, or gallops.  ABDOMEN: Soft, nontender, nondistended. Bowel sounds present. No organomegaly or mass.  EXTREMITIES: No cyanosis, clubbing or edema b/l.    NEUROLOGIC: Cranial nerves II through XII are intact. No focal Motor or sensory deficits b/l.   PSYCHIATRIC: The patient is alert and awake SKIN: No obvious rash, lesion, or ulcer.   LABORATORY PANEL:   CBC  Recent Labs Lab 05/23/17 0420  WBC 6.7  HGB 8.0*  HCT 23.5*  PLT 13*   ------------------------------------------------------------------------------------------------------------------ Chemistries   Recent Labs Lab 05/18/17 0442  05/22/17 1732 05/23/17 0420  NA 136  < > 137 137  K 3.7  < > 4.1 3.8  CL 103  < > 106 107  CO2 21*  < > 21* 24  GLUCOSE 122*  < > 100* 124*  BUN 24*  < > 22* 23*  CREATININE 1.61*  < > 1.63* 1.65*  CALCIUM 10.6*  < > 10.5* 10.2  MG 2.2  --   --   --   AST  --   --  257*  --   ALT  --   --  78*  --   ALKPHOS  --   --  146*  --   BILITOT  --   --  1.8*  --   < > = values in this interval not displayed. ------------------------------------------------------------------------------------------------------------------  Cardiac Enzymes  Recent Labs Lab 05/23/17 0837  TROPONINI <0.03   ------------------------------------------------------------------------------------------------------------------  RADIOLOGY:  Ct Head Wo Contrast  Addendum Date: 05/22/2017   ADDENDUM  REPORT: 05/22/2017 17:54 ADDENDUM: A more recent comparison examination from 05/13/2017 has now been made available. This was not available at the time of original interpretation. The small subdural along the falx cerebrum on the left as well as along the left frontal lobe has resolved in the interval from the prior exam. The previously seen posterior  parietal subdural collection on the left has resolved with no significant residual component. There is some questionable prominence of the CSF space in the left frontal region best seen on image number 16 of series 2 which may represent a more chronic subdural hygroma. This was not well appreciated on the prior exam but has no acute component. No definitive acute hemorrhage is seen. These results were discussed by telephone at the time of interpretation on 05/22/2017 at 5:54 pm to Dr. Larae Grooms , who verbally acknowledged these results. Electronically Signed   By: Inez Catalina M.D.   On: 05/22/2017 17:54   Result Date: 05/22/2017 CLINICAL DATA:  Facial numbness EXAM: CT HEAD WITHOUT CONTRAST TECHNIQUE: Contiguous axial images were obtained from the base of the skull through the vertex without intravenous contrast. COMPARISON:  12/10/2008 FINDINGS: Brain: No evidence of acute infarction, hemorrhage, hydrocephalus, extra-axial collection or mass lesion/mass effect. Cavum septum pellucidum is noted. Vascular: No hyperdense vessel or unexpected calcification. Skull: Normal. Negative for fracture or focal lesion. Sinuses/Orbits: No acute finding. Other: None. IMPRESSION: No acute abnormality noted. Electronically Signed: By: Inez Catalina M.D. On: 05/22/2017 17:29   Dg Chest Port 1 View  Result Date: 05/23/2017 CLINICAL DATA:  Weakness headache and left facial numbness intermittently for the past 3 days. Raleson exam. History of CHF, metastatic breast malignancy, coronary artery disease, and CVA. EXAM: PORTABLE CHEST 1 VIEW COMPARISON:  Portable chest x-ray and chest CT scan of May 13, 2017. FINDINGS: The lungs are mildly hypoinflated. The interstitial markings are mildly increased but not greatly changed from the previous study. There is no significant pleural effusion. The cardiac silhouette is enlarged. The central pulmonary vascularity is prominent. The ICD is in stable position. The tracheostomy tube tip  projects at the superior margin of the clavicular heads. The porta catheter tip projects over the proximal SVC. IMPRESSION: Mild hypoinflation. Probable low-grade compensated CHF. No acute pneumonia. Electronically Signed   By: David  Martinique M.D.   On: 05/23/2017 07:15     ASSESSMENT AND PLAN:   #1. Thrombocytopenia, transfuse patient with 1 unit of platelets, follow platelet count transfusion. Etiology unclear No bleeding at this time Will wait for oncology input  #2. Anemia, status post 1 unit of packed red blood cell transfusion stable  #3. Intermittent numbness in multiple areas Etiology unclear but now resolved Appreciate Dr. Regenia Skeeter input OP F/U Labs pending Increase dose of Neurontin  #4. CKD stage III, follow with therapy Stable  #5. UTI ON ceftriaxone  #6. Malignant essential hypertension, continue patient on Cozaar, add labetalol, 1 dose intravenously now.  All the records are reviewed and case discussed with Care Management/Social Worker Management plans discussed with the patient, family and they are in agreement.  CODE STATUS: FULL CODE  DVT Prophylaxis: SCDs  TOTAL TIME TAKING CARE OF THIS PATIENT: 30 minutes.   POSSIBLE D/C IN 1-2 DAYS, DEPENDING ON CLINICAL CONDITION.  Hillary Bow R M.D on 05/23/2017 at 2:14 PM  Between 7am to 6pm - Pager - [phone]  After 6pm go to [email or web address] - password [email or web address] [facility]  Office  [phone]  CC: Primary care physician; Sanda Klein,  Satira Anis, MD  Note: This dictation was prepared with Dragon dictation along with smaller phrase technology. Any transcriptional errors that result from this process are unintentional.

## 2017-05-24 ENCOUNTER — Inpatient Hospital Stay (HOSPITAL_COMMUNITY)
Admission: AD | Admit: 2017-05-24 | Discharge: 2017-05-26 | DRG: 813 | Disposition: A | Discharge status: Home Health | Payer: PPO | Source: Other Acute Inpatient Hospital | Attending: Internal Medicine | Admitting: Internal Medicine

## 2017-05-24 ENCOUNTER — Encounter: Payer: Self-pay | Admitting: Family Medicine

## 2017-05-24 DIAGNOSIS — N183 Chronic kidney disease, stage 3 unspecified: Secondary | ICD-10-CM | POA: Diagnosis present

## 2017-05-24 DIAGNOSIS — D631 Anemia in chronic kidney disease: Secondary | ICD-10-CM | POA: Diagnosis not present

## 2017-05-24 DIAGNOSIS — Z91012 Allergy to eggs: Secondary | ICD-10-CM | POA: Diagnosis not present

## 2017-05-24 DIAGNOSIS — Z9049 Acquired absence of other specified parts of digestive tract: Secondary | ICD-10-CM

## 2017-05-24 DIAGNOSIS — Z66 Do not resuscitate: Secondary | ICD-10-CM | POA: Diagnosis not present

## 2017-05-24 DIAGNOSIS — N390 Urinary tract infection, site not specified: Secondary | ICD-10-CM | POA: Diagnosis not present

## 2017-05-24 DIAGNOSIS — C7951 Secondary malignant neoplasm of bone: Secondary | ICD-10-CM | POA: Diagnosis not present

## 2017-05-24 DIAGNOSIS — E860 Dehydration: Secondary | ICD-10-CM | POA: Diagnosis not present

## 2017-05-24 DIAGNOSIS — R531 Weakness: Secondary | ICD-10-CM | POA: Diagnosis not present

## 2017-05-24 DIAGNOSIS — R200 Anesthesia of skin: Secondary | ICD-10-CM | POA: Diagnosis not present

## 2017-05-24 DIAGNOSIS — Z79899 Other long term (current) drug therapy: Secondary | ICD-10-CM

## 2017-05-24 DIAGNOSIS — Z90710 Acquired absence of both cervix and uterus: Secondary | ICD-10-CM

## 2017-05-24 DIAGNOSIS — Z8673 Personal history of transient ischemic attack (TIA), and cerebral infarction without residual deficits: Secondary | ICD-10-CM

## 2017-05-24 DIAGNOSIS — Z79811 Long term (current) use of aromatase inhibitors: Secondary | ICD-10-CM

## 2017-05-24 DIAGNOSIS — Z930 Tracheostomy status: Secondary | ICD-10-CM

## 2017-05-24 DIAGNOSIS — T451X5A Adverse effect of antineoplastic and immunosuppressive drugs, initial encounter: Secondary | ICD-10-CM | POA: Diagnosis not present

## 2017-05-24 DIAGNOSIS — K219 Gastro-esophageal reflux disease without esophagitis: Secondary | ICD-10-CM

## 2017-05-24 DIAGNOSIS — B9620 Unspecified Escherichia coli [E. coli] as the cause of diseases classified elsewhere: Secondary | ICD-10-CM | POA: Diagnosis not present

## 2017-05-24 DIAGNOSIS — I2510 Atherosclerotic heart disease of native coronary artery without angina pectoris: Secondary | ICD-10-CM | POA: Diagnosis present

## 2017-05-24 DIAGNOSIS — C50919 Malignant neoplasm of unspecified site of unspecified female breast: Secondary | ICD-10-CM | POA: Diagnosis not present

## 2017-05-24 DIAGNOSIS — I482 Chronic atrial fibrillation, unspecified: Secondary | ICD-10-CM | POA: Diagnosis present

## 2017-05-24 DIAGNOSIS — F17210 Nicotine dependence, cigarettes, uncomplicated: Secondary | ICD-10-CM | POA: Diagnosis not present

## 2017-05-24 DIAGNOSIS — G8921 Chronic pain due to trauma: Secondary | ICD-10-CM | POA: Diagnosis present

## 2017-05-24 DIAGNOSIS — N179 Acute kidney failure, unspecified: Secondary | ICD-10-CM | POA: Diagnosis present

## 2017-05-24 DIAGNOSIS — Z8249 Family history of ischemic heart disease and other diseases of the circulatory system: Secondary | ICD-10-CM

## 2017-05-24 DIAGNOSIS — D696 Thrombocytopenia, unspecified: Principal | ICD-10-CM | POA: Diagnosis present

## 2017-05-24 DIAGNOSIS — N189 Chronic kidney disease, unspecified: Secondary | ICD-10-CM | POA: Diagnosis not present

## 2017-05-24 DIAGNOSIS — C50211 Malignant neoplasm of upper-inner quadrant of right female breast: Secondary | ICD-10-CM | POA: Diagnosis not present

## 2017-05-24 DIAGNOSIS — Z950 Presence of cardiac pacemaker: Secondary | ICD-10-CM

## 2017-05-24 DIAGNOSIS — Z515 Encounter for palliative care: Secondary | ICD-10-CM | POA: Diagnosis not present

## 2017-05-24 DIAGNOSIS — M352 Behcet's disease: Secondary | ICD-10-CM | POA: Diagnosis not present

## 2017-05-24 DIAGNOSIS — I10 Essential (primary) hypertension: Secondary | ICD-10-CM | POA: Diagnosis present

## 2017-05-24 DIAGNOSIS — D6481 Anemia due to antineoplastic chemotherapy: Secondary | ICD-10-CM | POA: Diagnosis present

## 2017-05-24 DIAGNOSIS — I252 Old myocardial infarction: Secondary | ICD-10-CM | POA: Diagnosis not present

## 2017-05-24 DIAGNOSIS — I509 Heart failure, unspecified: Secondary | ICD-10-CM | POA: Diagnosis not present

## 2017-05-24 DIAGNOSIS — Z7901 Long term (current) use of anticoagulants: Secondary | ICD-10-CM | POA: Diagnosis not present

## 2017-05-24 DIAGNOSIS — Z7189 Other specified counseling: Secondary | ICD-10-CM | POA: Diagnosis not present

## 2017-05-24 DIAGNOSIS — Z888 Allergy status to other drugs, medicaments and biological substances status: Secondary | ICD-10-CM

## 2017-05-24 DIAGNOSIS — Z9013 Acquired absence of bilateral breasts and nipples: Secondary | ICD-10-CM

## 2017-05-24 DIAGNOSIS — K259 Gastric ulcer, unspecified as acute or chronic, without hemorrhage or perforation: Secondary | ICD-10-CM | POA: Diagnosis not present

## 2017-05-24 DIAGNOSIS — D649 Anemia, unspecified: Secondary | ICD-10-CM | POA: Diagnosis not present

## 2017-05-24 DIAGNOSIS — I130 Hypertensive heart and chronic kidney disease with heart failure and stage 1 through stage 4 chronic kidney disease, or unspecified chronic kidney disease: Secondary | ICD-10-CM | POA: Diagnosis not present

## 2017-05-24 DIAGNOSIS — Z853 Personal history of malignant neoplasm of breast: Secondary | ICD-10-CM

## 2017-05-24 LAB — RETICULOCYTES
RBC.: 2.82 MIL/uL — AB (ref 3.87–5.11)
Retic Count, Absolute: 98.7 10*3/uL (ref 19.0–186.0)
Retic Ct Pct: 3.5 % — ABNORMAL HIGH (ref 0.4–3.1)

## 2017-05-24 LAB — COMPREHENSIVE METABOLIC PANEL
ALK PHOS: 145 U/L — AB (ref 38–126)
ALT: 67 U/L — ABNORMAL HIGH (ref 14–54)
AST: 234 U/L — ABNORMAL HIGH (ref 15–41)
Albumin: 3.4 g/dL — ABNORMAL LOW (ref 3.5–5.0)
Anion gap: 10 (ref 5–15)
BUN: 27 mg/dL — ABNORMAL HIGH (ref 6–20)
CALCIUM: 10.9 mg/dL — AB (ref 8.9–10.3)
CO2: 24 mmol/L (ref 22–32)
CREATININE: 1.79 mg/dL — AB (ref 0.44–1.00)
Chloride: 106 mmol/L (ref 101–111)
GFR, EST AFRICAN AMERICAN: 33 mL/min — AB (ref >60)
GFR, EST NON AFRICAN AMERICAN: 29 mL/min — AB (ref >60)
Glucose, Bld: 107 mg/dL — ABNORMAL HIGH (ref 65–99)
Potassium: 3.9 mmol/L (ref 3.5–5.1)
Sodium: 140 mmol/L (ref 135–145)
Total Bilirubin: 1.2 mg/dL (ref 0.3–1.2)
Total Protein: 7.6 g/dL (ref 6.5–8.1)

## 2017-05-24 LAB — BASIC METABOLIC PANEL
Anion gap: 9 (ref 5–15)
BUN: 28 mg/dL — AB (ref 6–20)
CALCIUM: 10.3 mg/dL (ref 8.9–10.3)
CHLORIDE: 105 mmol/L (ref 101–111)
CO2: 25 mmol/L (ref 22–32)
Creatinine, Ser: 2 mg/dL — ABNORMAL HIGH (ref 0.44–1.00)
GFR calc Af Amer: 29 mL/min — ABNORMAL LOW (ref >60)
GFR, EST NON AFRICAN AMERICAN: 25 mL/min — AB (ref >60)
Glucose, Bld: 107 mg/dL — ABNORMAL HIGH (ref 65–99)
Potassium: 4.2 mmol/L (ref 3.5–5.1)
SODIUM: 139 mmol/L (ref 135–145)

## 2017-05-24 LAB — PROTIME-INR
INR: 1.19
Prothrombin Time: 15.1 seconds (ref 11.4–15.2)

## 2017-05-24 LAB — CBC
HCT: 24.9 % — ABNORMAL LOW (ref 35.0–47.0)
HEMOGLOBIN: 8.4 g/dL — AB (ref 12.0–16.0)
MCH: 30.2 pg (ref 26.0–34.0)
MCHC: 33.7 g/dL (ref 32.0–36.0)
MCV: 89.6 fL (ref 80.0–100.0)
PLATELETS: 15 10*3/uL — AB (ref 150–440)
RBC: 2.77 MIL/uL — ABNORMAL LOW (ref 3.80–5.20)
RDW: 18.8 % — AB (ref 11.5–14.5)
WBC: 8.9 10*3/uL (ref 3.6–11.0)

## 2017-05-24 LAB — GLUCOSE, CAPILLARY: GLUCOSE-CAPILLARY: 96 mg/dL (ref 65–99)

## 2017-05-24 LAB — CBC WITH DIFFERENTIAL/PLATELET
BASOS PCT: 8 %
Basophils Absolute: 0.5 10*3/uL — ABNORMAL HIGH (ref 0.0–0.1)
EOS ABS: 0.1 10*3/uL (ref 0.0–0.7)
Eosinophils Relative: 2 %
HCT: 25.6 % — ABNORMAL LOW (ref 36.0–46.0)
HEMOGLOBIN: 8.5 g/dL — AB (ref 12.0–15.0)
LYMPHS ABS: 2.2 10*3/uL (ref 0.7–4.0)
LYMPHS PCT: 36 %
MCH: 30.1 pg (ref 26.0–34.0)
MCHC: 33.2 g/dL (ref 30.0–36.0)
MCV: 90.8 fL (ref 78.0–100.0)
Monocytes Absolute: 1.2 10*3/uL — ABNORMAL HIGH (ref 0.1–1.0)
Monocytes Relative: 19 %
NEUTROS ABS: 2.2 10*3/uL (ref 1.7–7.7)
Neutrophils Relative %: 35 %
PLATELETS: 16 10*3/uL — AB (ref 150–400)
RBC: 2.82 MIL/uL — ABNORMAL LOW (ref 3.87–5.11)
RDW: 18.3 % — ABNORMAL HIGH (ref 11.5–15.5)
WBC: 6.2 10*3/uL (ref 4.0–10.5)

## 2017-05-24 LAB — BPAM PLATELET PHERESIS
BLOOD PRODUCT EXPIRATION DATE: 201808172359
ISSUE DATE / TIME: 201808160503
UNIT TYPE AND RH: 8400

## 2017-05-24 LAB — C-REACTIVE PROTEIN: CRP: 7.5 mg/dL — ABNORMAL HIGH (ref <1.0)

## 2017-05-24 LAB — PREPARE PLATELET PHERESIS: Unit division: 0

## 2017-05-24 LAB — BILIRUBIN, DIRECT: BILIRUBIN DIRECT: 0.2 mg/dL (ref 0.1–0.5)

## 2017-05-24 LAB — HIV ANTIBODY (ROUTINE TESTING W REFLEX): HIV SCREEN 4TH GENERATION: NONREACTIVE

## 2017-05-24 LAB — LACTATE DEHYDROGENASE: LDH: 991 U/L — AB (ref 98–192)

## 2017-05-24 LAB — BRAIN NATRIURETIC PEPTIDE: B NATRIURETIC PEPTIDE 5: 135.8 pg/mL — AB (ref 0.0–100.0)

## 2017-05-24 LAB — URINE CULTURE: Culture: >=100000 — AB

## 2017-05-24 LAB — APTT: aPTT: 39 seconds — ABNORMAL HIGH (ref 24–36)

## 2017-05-24 LAB — SAVE SMEAR

## 2017-05-24 LAB — ADAMTS13 ACTIVITY

## 2017-05-24 LAB — ADAMTS13 ACTIVITY REFLEX

## 2017-05-24 MED ORDER — NICOTINE 21 MG/24HR TD PT24: 21.0000 mg | MEDICATED_PATCH | TRANSDERMAL | Status: DC

## 2017-05-24 MED ORDER — LEVETIRACETAM 500 MG PO TABS
500.0000 mg | ORAL_TABLET | Freq: Two times a day (BID) | ORAL | Status: DC
  Administered 2017-05-24 – 2017-05-26 (×4): 500 mg via ORAL
  Filled 2017-05-24 (×4): qty 1

## 2017-05-24 MED ORDER — ACETAMINOPHEN 325 MG PO TABS: 650.0000 mg | ORAL_TABLET | Freq: Four times a day (QID) | ORAL | Status: DC | PRN

## 2017-05-24 MED ORDER — ONDANSETRON HCL 8 MG PO TABS: 8.0000 mg | ORAL_TABLET | Freq: Two times a day (BID) | ORAL | Status: DC | PRN

## 2017-05-24 MED ORDER — GABAPENTIN 300 MG PO CAPS
300.0000 mg | ORAL_CAPSULE | Freq: Three times a day (TID) | ORAL | Status: DC
  Administered 2017-05-24 – 2017-05-26 (×5): 300 mg via ORAL
  Filled 2017-05-24 (×5): qty 1

## 2017-05-24 MED ORDER — LETROZOLE 2.5 MG PO TABS
2.5000 mg | ORAL_TABLET | Freq: Every day | ORAL | Status: DC
Start: 2017-05-25 — End: 2017-05-26
  Administered 2017-05-25 – 2017-05-26 (×2): 2.5 mg via ORAL
  Filled 2017-05-24 (×2): qty 1

## 2017-05-24 MED ORDER — AMLODIPINE BESYLATE 10 MG PO TABS
10.0000 mg | ORAL_TABLET | Freq: Every day | ORAL | Status: DC
  Administered 2017-05-24 – 2017-05-26 (×3): 10 mg via ORAL
  Filled 2017-05-24 (×3): qty 1

## 2017-05-24 MED ORDER — ZOLPIDEM TARTRATE 5 MG PO TABS: 5.0000 mg | ORAL_TABLET | Freq: Every evening | ORAL | Status: DC | PRN

## 2017-05-24 MED ORDER — ACETAMINOPHEN 650 MG RE SUPP: 650.0000 mg | Freq: Four times a day (QID) | RECTAL | Status: DC | PRN

## 2017-05-24 MED ORDER — LIDOCAINE-PRILOCAINE 2.5-2.5 % EX CREA: 1.0000 "application " | TOPICAL_CREAM | Freq: Every day | CUTANEOUS | Status: DC | PRN

## 2017-05-24 MED ORDER — HYDRALAZINE HCL 20 MG/ML IJ SOLN
5.0000 mg | INTRAMUSCULAR | Status: DC | PRN
  Filled 2017-05-24: qty 0.25

## 2017-05-24 MED ORDER — PANTOPRAZOLE SODIUM 40 MG PO TBEC
40.0000 mg | DELAYED_RELEASE_TABLET | Freq: Every day | ORAL | Status: DC
  Administered 2017-05-24 – 2017-05-26 (×3): 40 mg via ORAL
  Filled 2017-05-24 (×3): qty 1

## 2017-05-24 MED ORDER — TRIAMCINOLONE ACETONIDE 0.1 % EX CREA: 1.0000 "application " | TOPICAL_CREAM | Freq: Two times a day (BID) | CUTANEOUS | Status: DC

## 2017-05-24 MED ORDER — SODIUM CHLORIDE 0.9 % IV SOLN
INTRAVENOUS | Status: DC
Start: 2017-05-24 — End: 2017-05-26
  Administered 2017-05-24: via INTRAVENOUS

## 2017-05-24 MED ORDER — OXYCODONE HCL 5 MG PO TABS: 5.0000 mg | ORAL_TABLET | ORAL | Status: DC | PRN

## 2017-05-24 MED ORDER — LATANOPROST 0.005 % OP SOLN
1.0000 [drp] | Freq: Every day | OPHTHALMIC | Status: DC
  Administered 2017-05-24 – 2017-05-25 (×2): 1 [drp] via OPHTHALMIC
  Filled 2017-05-24: qty 2.5

## 2017-05-24 NOTE — H&P (Signed)
History and Physical    [name] CWU:[id number] DOB: [birth year] DOA: 05/24/2017  Referring MD/NP/PA:   PCP: Arnetha Courser, MD   Patient coming from:  The patient is coming from home.  At baseline, pt is independent for most of ADL.   Chief Complaint: Thrombocytopenia, numbness in multiple place of her body  HPI: [name] is a 66 y.o. female with medical history significant of metastasized breast cancer diffuse bony metastases, hypertension, stroke, GERD, s/p of pacemaker placement, s/p of tracheostomy, CAD, gastric ulcer, dCHF, atrial fibrillation not on anticoagulants, brain aneurysm, subdural hematoma, tobacco abuse, CKD-3, who presents with thrombocytopenia.  Patient is transferred from [facility]. Pt has severe thrombocytopenia. Her platelet was 15,000, which improved to 17,000 after 1 unit of platelet transfusion. Workup was done in [facility] including CRP 7.5, LDH 959, ESR>140, ADAMT13>100, schistocytes on peripheral smear and DAT negative. Pt states that she saw very little blood when she wipes after using bathroom. She reports that she has intermittent numbness in multiple places over her body, including right face and bilateral legs. She also has generalized weakness. No facial droop, slurred speech, vision change or hearing loss. Pt was being treated for UTI in [place]. She still has increased urinary frequency, but no dysuria or burning on urination. Patient denies chest pain, shortness rest, cough, nausea, vomiting, diarrhea or abdominal pain.  Review of Systems:   General: no fevers, chills, no body weight gain, has poor appetite, has fatigue HEENT: no blurry vision, hearing changes or sore throat Respiratory: no dyspnea, coughing, wheezing CV: no chest pain, no palpitations GI: no nausea, vomiting, abdominal pain, diarrhea, constipation GU: no dysuria, burning on urination, has increased urinary frequency, no hematuria  Ext: no  leg edema Neuro: has numbness. no vision change or hearing loss Skin: no rash, no skin tear. Has bruises. MSK: No muscle spasm, no deformity, no limitation of range of movement in spin Heme: No easy bruising.  Travel history: No recent long distant travel.  Allergy:  Allergies  Allergen Reactions  . Lisinopril Cough  . Eggs Or Egg-Derived Products Rash    Raw eggs ONLY (boiled, scrambled, etc)  If baked into something it is NOT a issue    Past Medical History:  Diagnosis Date  . Aneurysm ([place]) 1998   Brain  . Anticoagulated on Coumadin   . Arthritis   . Atherosclerosis of coronary artery of native heart without angina pectoris   . Atrial fibrillation, chronic ([place])   . 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 Breast cancer ([place]) 06/21/2016   Right breast  . CHF (congestive heart failure) ([place])   . Chronic hypokalemia   . Chronic kidney disease    stage III  . Chronic pain due to trauma   . Difficult intubation    permanent tracheostomy;lungs filled with water when extubated  . Frequent headaches   . GERD (gastroesophageal reflux disease)   . History of stomach ulcers   . Hypertension   . Hypertension, goal below 140/90   . Myocardial infarction ([place])   . PONV (postoperative nausea and vomiting)   . 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 Spinal headache   . Stroke [facility])    ?per patient; history transverse venous sinus thrombosis 2005 (evaluated at [facility]; lifelong anticoagulation recommended)  . Trachea, ring   . Tracheostomy in place [facility])   . Urinary tract infection, recurrent   . Urine incontinence  Past Surgical History:  Procedure Laterality Date  . ABDOMINAL HYSTERECTOMY    . BACK SURGERY    . BLADDER SURGERY     hitch  . CHOLECYSTECTOMY    . FRACTURE SURGERY     B/L feet and right ankle  . GANGLION CYST EXCISION  2011  . MASTECTOMY MODIFIED RADICAL Bilateral 12/13/2016   Procedure: RIGHT MODIFIED RADICAL MASTECTOMY, LEFT SIMPLE MASTECTOMY;   Surgeon: Coralie Keens, MD;  Location: [place];  Service: General;  Laterality: Bilateral;  . PACEMAKER INSERTION  10/29/2005  . PACEMAKER PLACEMENT  08/22/2012  . PORT A CATH INJECTION ([place] HX)  07/18/2016  . PORTACATH PLACEMENT Right 07/18/2016   Procedure: INSERTION PORT-A-CATH;  Surgeon: Coralie Keens, MD;  Location: [place];  Service: General;  Laterality: Right;  . TRACHEAL SURGERY  2000  . UVULOPALATOPHARYNGOPLASTY      Social History:  reports that she has been smoking Cigarettes.  She has smoked for the past 40.00 years. She has never used smokeless tobacco. She reports that she does not drink alcohol or use drugs.  Family History:  Family History  Problem Relation Age of Onset  . Heart disease Mother   . Hypertension Mother   . Anemia Mother   . Heart disease Father   . Hypertension Father   . Anemia Father   . Cancer Unknown   . Heart disease Sister   . Heart disease Brother   . Mental illness Brother      Prior to Admission medications   Medication Sig Start Date End Date Taking? Authorizing Provider  gabapentin (NEURONTIN) 300 MG capsule Take 1 capsule (300 mg total) by mouth 2 (two) times daily. Patient taking differently: Take 300 mg by mouth 3 (three) times daily.  05/18/17   Mariel Aloe, MD  hydrochlorothiazide (HYDRODIURIL) 25 MG tablet Take 25 mg by mouth daily.    [provider]  latanoprost (XALATAN) 0.005 % ophthalmic solution Place 1 drop into both eyes at bedtime.  05/04/10   [provider]  letrozole (FEMARA) 2.5 MG tablet Take 1 tablet (2.5 mg total) by mouth daily. 12/24/16   Nicholas Lose, MD  levETIRAcetam (KEPPRA) 500 MG tablet Take 1 tablet (500 mg total) by mouth 2 (two) times daily. 05/18/17   Mariel Aloe, MD  lidocaine-prilocaine (EMLA) cream Apply to affected area once Patient taking differently: Apply 1 application topically daily as needed (prior to port access). Apply to affected area once 07/05/16   Nicholas Lose, MD    losartan (COZAAR) 100 MG tablet Take 1 tablet (100 mg total) by mouth daily. Patient taking differently: Take 100 mg by mouth at bedtime.  08/02/16   Lada, Satira Anis, MD  ondansetron (ZOFRAN) 8 MG tablet Take 1 tablet (8 mg total) by mouth 2 (two) times daily as needed for refractory nausea / vomiting. Start on day 3 after chemo. Patient not taking: Reported on 05/23/2017 07/05/16   Nicholas Lose, MD  oxyCODONE (OXY IR/ROXICODONE) 5 MG immediate release tablet Take 1-2 tablets (5-10 mg total) by mouth every 4 (four) hours as needed for moderate pain. Patient not taking: Reported on 05/23/2017 12/18/16   Coralie Keens, MD  pantoprazole (PROTONIX) 40 MG tablet TAKE 1 TABLET BY MOUTH EVERY DAY 02/25/17   Lada, Satira Anis, MD  potassium chloride (KLOR-CON M10) 10 MEQ tablet Take 1 tablet (10 mEq total) by mouth 3 (three) times daily. 07/06/16   Arnetha Courser, MD  prochlorperazine (COMPAZINE) 10 MG tablet Take 1 tablet (  10 mg total) by mouth every 6 (six) hours as needed for nausea or vomiting. Patient not taking: Reported on 05/23/2017 07/17/16   Nicholas Lose, MD  tizanidine (ZANAFLEX) 2 MG capsule Take 1-2 capsules (2-4 mg total) by mouth every 8 (eight) hours as needed for muscle spasms. Patient not taking: Reported on 05/23/2017 01/04/17   Arnetha Courser, MD  triamcinolone cream (KENALOG) 0.1 % Apply 1 application topically 2 (two) times daily. If needed Patient not taking: Reported on 04/26/2017 11/13/16   Arnetha Courser, MD    Physical Exam: Vitals:   05/24/17 1824 05/24/17 2007  BP: (!) 141/81 137/81  Pulse: 70 71  Resp: 18 16  Temp: 97.8 F (36.6 C) 98 F (36.7 C)  TempSrc: Oral Oral  SpO2: 98% 98%   General: Not in acute distress HEENT: s/p of trach       Eyes: PERRL, EOMI, no scleral icterus.       ENT: No discharge from the ears and nose, no pharynx injection, no tonsillar enlargement.        Neck: No JVD, no bruit, no mass felt. Heme: No neck lymph node enlargement. Cardiac:  S1/S2, RRR, No murmurs, No gallops or rubs. Respiratory: No rales, wheezing, rhonchi or rubs. GI: Soft, nondistended, nontender, no rebound pain, no organomegaly, BS present. GU: No hematuria Ext: No pitting leg edema bilaterally. 2+DP/PT pulse bilaterally. Musculoskeletal: No joint deformities, No joint redness or warmth, no limitation of ROM in spin. Skin: No rashes. Has bruises. Neuro: Alert, oriented X3, cranial nerves II-XII grossly intact, moves all extremities normally. Psych: Patient is not psychotic, no suicidal or hemocidal ideation.  Labs on Admission: I have personally reviewed following labs and imaging studies  CBC:  Recent Labs Lab 05/22/17 1334 05/22/17 1732 05/23/17 0420 05/23/17 2124 05/24/17 0330 05/24/17 1919  WBC 4.8 6.1 6.7  --  8.9 6.2  NEUTROABS  --  2.6  --   --   --  2.2  HGB 7.6* 8.7* 8.0*  --  8.4* 8.5*  HCT 23.1* 26.0* 23.5*  --  24.9* 25.6*  MCV 88.8 89.5 88.6  --  89.6 90.8  PLT 16* 17* 13* 19* 15* 16*   Basic Metabolic Panel:  Recent Labs Lab 05/18/17 0442 05/22/17 1334 05/22/17 1732 05/23/17 0420 05/24/17 0330 05/24/17 2000  NA 136 137 137 137 139 140  K 3.7 3.9 4.1 3.8 4.2 3.9  CL 103 106 106 107 105 106  CO2 21* 23 21* _0 GLUCOSE 122* 95 100* 124* 107* 107*  BUN 24* 23* 22* 23* 28* 27*  CREATININE 1.61* 1.77* 1.63* 1.65* 2.00* 1.79*  CALCIUM 10.6* 10.9* 10.5* 10.2 10.3 10.9*  MG 2.2  --   --   --   --   --    GFR: Estimated Creatinine Clearance: 39.6 mL/min (A) (by C-G formula based on SCr of 1.79 mg/dL (H)). Liver Function Tests:  Recent Labs Lab 05/22/17 1732 05/24/17 2000  AST 257* 234*  ALT 78* 67*  ALKPHOS 146* 145*  BILITOT 1.8* 1.2  PROT 7.4 7.6  ALBUMIN 3.4* 3.4*   No results for input(s): LIPASE, AMYLASE in the last 168 hours. No results for input(s): AMMONIA in the last 168 hours. Coagulation Profile:  Recent Labs Lab 05/22/17 1732 05/24/17 2000  INR 1.31 1.19   Cardiac Enzymes:  Recent  Labs Lab 05/22/17 1732 05/22/17 2016 05/23/17 0019 05/23/17 0420 05/23/17 0837  TROPONINI 0.03* 0.03* <0.03 <0.03 <0.03   BNP (  last 3 results) No results for input(s): PROBNP in the last 8760 hours. HbA1C: No results for input(s): HGBA1C in the last 72 hours. CBG:  Recent Labs Lab 05/18/17 0613 05/18/17 1204 05/18/17 1623 05/23/17 0750 05/24/17 0737  GLUCAP 123* 109* 106* 119* 96   Lipid Profile: No results for input(s): CHOL, HDL, LDLCALC, TRIG, CHOLHDL, LDLDIRECT in the last 72 hours. Thyroid Function Tests:  Recent Labs  05/23/17 0420  TSH 1.290   Anemia Panel:  Recent Labs  05/23/17 0420 05/23/17 0837 05/23/17 1602 05/24/17 1919  VITAMINB12  --   --  1,022*  --   FOLATE 13.4  --   --   --   RETICCTPCT  --  2.8  --  3.5*   Urine analysis:    Component Value Date/Time   COLORURINE YELLOW (A) 05/22/2017 1732   APPEARANCEUR CLEAR (A) 05/22/2017 1732   LABSPEC 1.010 05/22/2017 1732   PHURINE 6.0 05/22/2017 1732   GLUCOSEU NEGATIVE 05/22/2017 1732   GLUCOSEU NEGATIVE 11/30/2013 1455   HGBUR SMALL (A) 05/22/2017 1732   BILIRUBINUR NEGATIVE 05/22/2017 1732   BILIRUBINUR neg 11/25/2013 1528   KETONESUR NEGATIVE 05/22/2017 1732   PROTEINUR 30 (A) 05/22/2017 1732   UROBILINOGEN 0.2 11/30/2013 1455   NITRITE POSITIVE (A) 05/22/2017 1732   LEUKOCYTESUR TRACE (A) 05/22/2017 1732   Sepsis Labs: _0 (procalcitonin:4,lacticidven:4) ) Recent Results (from the past 240 hour(s))  Urine Culture     Status: Abnormal   Collection Time: 05/22/17  5:32 PM  Result Value Ref Range Status   Specimen Description URINE, RANDOM  Final   Special Requests NONE  Final   Culture >=100,000 COLONIES/mL ESCHERICHIA COLI (A)  Final   Report Status 05/24/2017 FINAL  Final   Organism ID, Bacteria ESCHERICHIA COLI (A)  Final      Susceptibility   Escherichia coli - MIC*    AMPICILLIN >=32 RESISTANT Resistant     CEFAZOLIN <=4 SENSITIVE Sensitive     CEFTRIAXONE <=1  SENSITIVE Sensitive     CIPROFLOXACIN <=0.25 SENSITIVE Sensitive     GENTAMICIN <=1 SENSITIVE Sensitive     IMIPENEM <=0.25 SENSITIVE Sensitive     NITROFURANTOIN <=16 SENSITIVE Sensitive     TRIMETH/SULFA >=320 RESISTANT Resistant     AMPICILLIN/SULBACTAM 8 SENSITIVE Sensitive     PIP/TAZO <=4 SENSITIVE Sensitive     Extended ESBL NEGATIVE Sensitive     * >=100,000 COLONIES/mL ESCHERICHIA COLI     Radiological Exams on Admission: Dg Chest Port 1 View  Result Date: 05/23/2017 CLINICAL DATA:  Weakness headache and left facial numbness intermittently for the past 3 days. Raleson exam. History of CHF, metastatic breast malignancy, coronary artery disease, and CVA. EXAM: PORTABLE CHEST 1 VIEW COMPARISON:  Portable chest x-ray and chest CT scan of May 13, 2017. FINDINGS: The lungs are mildly hypoinflated. The interstitial markings are mildly increased but not greatly changed from the previous study. There is no significant pleural effusion. The cardiac silhouette is enlarged. The central pulmonary vascularity is prominent. The ICD is in stable position. The tracheostomy tube tip projects at the superior margin of the clavicular heads. The porta catheter tip projects over the proximal SVC. IMPRESSION: Mild hypoinflation. Probable low-grade compensated CHF. No acute pneumonia. Electronically Signed   By: David  Martinique M.D.   On: 05/23/2017 07:15     EKG:  Not done yet, will get one.   Assessment/Plan Principal Problem:   Thrombocytopenia ([name]) Active Problems:   Hypertension goal BP (blood pressure) < 140/90  Artificial cardiac pacemaker   Tracheostomy status ([name])   Atrial fibrillation, chronic ([name])   CAD in native artery   Acute renal failure superimposed on stage 3 chronic kidney disease ([name])   Breast cancer of upper-inner quadrant of right female breast ([place])   Anemia due to antineoplastic chemotherapy   GERD (gastroesophageal reflux disease)   UTI (urinary tract  infection)   Thrombocytopenia ([place]): Etiology is not clear. Oncology, Dr. Benay Spice was consulted. Per Dr. Benay Spice,  "the thrombocytopenia is most likely related to metastatic breast cancer involving the bone marrow" with a low clinical suspicion for TTP".   -will admit to tele bed as inpt -Highly appreciate Dr. consultation, will follow up recommendations. -will get peripheral smear, PT/PTT, type screen, reticulocytes, direct bilirubin, LDH. -per Dr. Benay Spice, will transfuse platelets for bleeding or a count of less than 10,000 -Palliative care consult to help establish goals of care and CODE STATUS -Physical therapy evaluation  Hypertension goal BP (blood pressure) < 140/90: -Hold HCTZ and the Cozaar due to worsening renal function -Amlodipine 10 mg daily -IVF hydralazine when necessary  Tracheostomy status ([place]): no acute issues -RT consult for  management  Atrial fibrillation, chronic ([place]): CHA2DS2-VASc Score is 6, needs oral anticoagulation. Patient is on Coumadin at home. INR is pending on admission. Heart rate is well controlled. Pt is not on BB or CCB -will hold coumadin due to high risk of bleeding 2/2 severe thrombocytopenia -tele monitoring  Hx of CAD: No chest pain -Bp control -monitoring any CP  AoCKD-III: Baseline Cre is 1.6, pt's Cre is 2.00 on admission. Likely due to prerenal secondary to dehydration and continuation of ARB, diuretics. - IVF: 75 cc/h of NS - Check FeUrea - Follow up renal function by BMP - Hold HCTZ and cozarr  Breast cancer of upper-inner quadrant of right female breast [facility]): with diffused metastasis to bone. S/p of mastectomy bilaterally. -continue letrozole -f/u with oncology  Anemia due to antineoplastic chemotherapy: hgb 8.4. Per report, pt is status post 1 unit of packed red blood cell transfusion in [place] by CBC  Intermittent numbness in multiple areas: Etiology unclear. Neurology was consulted in [facility]. Per Dr. 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 Pt had negative CT-head on 05/22/17. Cannot to MRI of brain due to presents of pacemaker -Increased dose of Neurontin in [facility]  UTI: -ON ceftriaxone -F/U Bx and Ux  GERD: -Protonix  DVT ppx: SCD Code Status: Full code Family Communication: None at bed side.   Disposition Plan:  Anticipate discharge back to previous home environment Consults called:  Oncology, Dr. Clarice Pole Admission status:  Inpatient/tele      Date of Service 05/25/2017    Ivor Costa [facility] Pager [phone]  If 7PM-7AM, please contact night-coverage [email or web address] Password [email or web address] 05/25/2017, 2:42 AM

## 2017-05-24 NOTE — Progress Notes (Signed)
Patient transferred to [facility] via [employer] Reported called to [place], Therapist, sports.

## 2017-05-24 NOTE — Progress Notes (Signed)
Dr. Geralyn Flash has spoken with Dr. Benay Spice of oncology and wanted transferred to hospitalist service at [facility]. Needs Matched Platelets and likely plasmapheresis.  Called care link. Waiting for callback

## 2017-05-24 NOTE — Progress Notes (Addendum)
IP PROGRESS NOTE  Subjective:   [name] is followed by Dr. Lindi Adie with metastatic breast cancer. She was transferred from [facility] today with severe thrombocytopenia. She has a history of severe from cytopenia for the past month. She initially had severe leukopenia after receiving palbociclib. The white count has recovered but she has persistent severe from a cytopenia despite multiple platelet transfusions, last given on 05/24/2017.  She has metastatic breast cancer involving the bones, currently maintained on letrozole.  [name] complains of intermittent left facial numbness, lower leg/foot numbness, and intermittent leg weakness for the past 3 weeks. She reports that she can ambulate. No difficulty with bowel or bladder control. No bleeding other than bruising. She denies pain.  Objective: Vital signs in last 24 hours: Blood pressure 137/81, pulse 71, temperature 98 F (36.7 C), temperature source Oral, resp. rate 16, SpO2 98 %.  Intake/Output from previous day: No intake/output data recorded.  Physical Exam:  HEENT: No thrush or bleeding Lungs: Decreased breath sounds at the left lower posterior chest, no respiratory distress Cardiac: Regular rate and rhythm Abdomen: No hepatomegaly, nontender Extremities: No leg edema Skin: Ecchymoses at the left upper medial arm, no petechiae Neurologic: Alert and oriented, the motor exam appears intact in the upper extremity bilaterally. Weakness at the left greater than right foot with plantar/dorsi flexion. She is able to flex at the hip bilaterally. Diminished sensation to light touch at the left lower leg. The face is symmetric. The speech is fluent. Breasts: Status post bilateral mastectomy-several 2 mm cutaneous nodules inferior to the right mastectomy scar  Portacath/PICC-without erythema  Lab Results:  Recent Labs  05/23/17 0420 05/23/17 2124 05/24/17 0330  WBC 6.7  --  8.9  HGB 8.0*  --  8.4*  HCT 23.5*  --  24.9*   PLT 13* 19* 15*    BMET  Recent Labs  05/23/17 0420 05/24/17 0330  NA 137 139  K 3.8 4.2  CL 107 105  CO2 24 25  GLUCOSE 124* 107*  BUN 23* 28*  CREATININE 1.65* 2.00*  CALCIUM 10.2 10.3   ADAMS13 activity on 05/23/2017-greater than 100   05/22/2017: PT/INR 1.31 No results found for: CEA1  Studies/Results: Dg Chest Port 1 View  Result Date: 05/23/2017 CLINICAL DATA:  Weakness headache and left facial numbness intermittently for the past 3 days. Raleson exam. History of CHF, metastatic breast malignancy, coronary artery disease, and CVA. EXAM: PORTABLE CHEST 1 VIEW COMPARISON:  Portable chest x-ray and chest CT scan of May 13, 2017. FINDINGS: The lungs are mildly hypoinflated. The interstitial markings are mildly increased but not greatly changed from the previous study. There is no significant pleural effusion. The cardiac silhouette is enlarged. The central pulmonary vascularity is prominent. The ICD is in stable position. The tracheostomy tube tip projects at the superior margin of the clavicular heads. The porta catheter tip projects over the proximal SVC. IMPRESSION: Mild hypoinflation. Probable low-grade compensated CHF. No acute pneumonia. Electronically Signed   By: David  Martinique M.D.   On: 05/23/2017 07:15    Medications: I have reviewed the patient's current medications.  Assessment/Plan:  1. 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 Severe anemia/thrombocytopenia 3. Chronic renal failure 4. Subdural hemorrhage 05/13/2017 after presenting with lower extremity weakness 5. History of neutropenia while on palbociclib, July 2018-resolved 6. Behcet's syndrome 7. History of congestive heart failure 8. Status post tracheostomy 9. History of atrial  fibrillation  [name] is admitted with severe thrombocytopenia. The thrombocytopenia is most likely  related to metastatic breast cancer involving the bone marrow. She may have low-grade DIC related to the metastatic breast cancer.  I have a low clinical suspicion for TTP. The normal Adams13 activity, chronicity of the thrombocytopenia, lack of a significant reticulocytosis, and baseline creatinine argue against this.  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  She appears to have a poor prognosis in the setting of refractory metastatic breast cancer, cytopenias, and multiple comorbid conditions.  Recommendations: 1. Transfuse platelets for bleeding or a count of less than 10,000 2. I will review a peripheral blood smear when this is available (she refused a blood draw earlier this evening) 3. Check PT/PTT and fibrinogen levels 4. Consider diagnostic bone marrow biopsy 5. Neurology evaluation for the leg weakness/numbness 6. Palliative care consult to help establish goals of care and CODE STATUS 7. Physical therapy evaluation  I will continue following her with the medical service over the weekend. Dr. Lindi Adie will return on 05/27/2017.   LOS: 0 days   Donneta Romberg, MD   05/24/2017, 8:16 PM  Blood Smear 8/17:platelets are markedly decreased, no clumps. Increased polychromasia. Mature Nrbcs and more immature nucleated rbc precursors.Teardrops, few helmets, rare schistocyte. Bands, few myelocytes.  Appearance consistent with leukoerythroblastic smear.

## 2017-05-24 NOTE — Discharge Summary (Signed)
[place] [facility] [place] NAME: [name]    MR#:  [id number]  DATE OF BIRTH:  04/20/1951  DATE OF ADMISSION:  05/22/2017 ADMITTING PHYSICIAN: Theodoro Grist, MD  DATE OF DISCHARGE: 05/24/2017  PRIMARY CARE PHYSICIAN: Arnetha Courser, MD   ADMISSION DIAGNOSIS:  Rales [R09.89] Thrombocytopenia ([place]) [D69.6] Facial numbness [R20.0] Anemia, unspecified type [D64.9]  DISCHARGE DIAGNOSIS:  Active Problems:   Thrombocytopenia ([place])   Facial numbness   SECONDARY DIAGNOSIS:   Past Medical History:  Diagnosis Date  . 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 Anticoagulated on Coumadin   . Arthritis   . Atherosclerosis of coronary artery of native heart without angina pectoris   . Atrial fibrillation, chronic ([place])   . Behcet's syndrome ([place])   . Breast cancer ([place]) 06/21/2016   Right breast  . CHF (congestive heart failure) ([place])   . Chronic hypokalemia   . Chronic kidney disease    stage III  . Chronic pain due to trauma   . Difficult intubation    permanent tracheostomy;lungs filled with water when extubated  . Frequent headaches   . GERD (gastroesophageal reflux disease)   . History of stomach ulcers   . Hypertension   . Hypertension, goal below 140/90   . Myocardial infarction ([place])   . PONV (postoperative nausea and vomiting)   . 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 Spinal headache   . 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 Trachea, ring   . Tracheostomy in place [facility])   . Urinary tract infection, recurrent   . Urine incontinence      ADMITTING HISTORY  HISTORY OF PRESENT ILLNESS: [name]  is a 66 y.o. female with a known history of Stroke, hypertension, gastroesophageal reflux disease, diabetes, CAD stage III, chronic hypokalemia, CHF, metastatic breast cancer, age of ablation, permanent pacemaker placement,  who presents to the hospital with complaints of weakness, headaches, left facial numbness, which is going on and off for the past 3 days. She was noted to be anemic and thrombocytopenic. Hospitalist services were contacted for admission. She has noticed some hematuria and rectal bleeding on and off over the past one week, also on and off bleeding from tracheostomy tube for the past 6 weeks, none over the past 3 days. . The patient apparently was diagnosed with subdural hematoma around left frontal lobe on 05/13/2017, she was given Keppra for 1 week. Repeated CT scan today shows no subdural hematoma.  HOSPITAL COURSE:   #1. Thrombocytopenia, transfuse patient with 1 unit of platelets, follow platelet count transfusion. No significant improvement in platelet count. Continues to be low at 15,000. Discussed with Dr. Mike Gip of oncology. Etiology is unclear. She may need plasmapheresis and bone marrow biopsy. At this time plan is to transfer patient to [facility] to see her oncologist. Will need matched platelets to which she has responded recently. She was on prednisone recently with no change.  Discussed with Dr. Zigmund Daniel at [facility] who has graciously accepted the patient in transfer for further care.  #2. Anemia, status post 1 unit of packed red blood cell transfusion stable  #3. Intermittent numbness in multiple areas Etiology unclear but now resolved Appreciate Dr. Regenia Skeeter input OP F/U Increased dose of Neurontin  #4. CKD stage III, follow with therapy Stable  #5. UTI ON ceftriaxone Cultures pending  #  6. Malignant essential hypertension On Cozaar and labetalol.  # recent subdural hematoma has resolved on repeat CT scan.  # status post tracheostomy. On room air. Stable.  Patient has been stabilized and will be transferred to [facility].  Will need oncology and palliative care consults.  CONSULTS OBTAINED:  Treatment Team:  Alexis Goodell,  MD Lequita Asal, MD  DRUG ALLERGIES:   Allergies  Allergen Reactions  . Lisinopril Cough  . Eggs Or Egg-Derived Products Rash    Raw eggs ONLY (boiled, scrambled, etc)  If baked into something it is NOT a issue    DISCHARGE MEDICATIONS:   Current Discharge Medication List    CONTINUE these medications which have NOT CHANGED   Details  gabapentin (NEURONTIN) 300 MG capsule Take 1 capsule (300 mg total) by mouth 2 (two) times daily.   Associated Diagnoses: Arthralgia of lower leg    hydrochlorothiazide (HYDRODIURIL) 25 MG tablet Take 25 mg by mouth daily.    letrozole (FEMARA) 2.5 MG tablet Take 1 tablet (2.5 mg total) by mouth daily. Qty: 90 tablet, Refills: 3    levETIRAcetam (KEPPRA) 500 MG tablet Take 1 tablet (500 mg total) by mouth 2 (two) times daily. Qty: 3 tablet, Refills: 0    losartan (COZAAR) 100 MG tablet Take 1 tablet (100 mg total) by mouth daily. Qty: 30 tablet, Refills: 5   Associated Diagnoses: Hypertension goal BP (blood pressure) < 140/90    pantoprazole (PROTONIX) 40 MG tablet TAKE 1 TABLET BY MOUTH EVERY DAY Qty: 90 tablet, Refills: 0    potassium chloride (KLOR-CON M10) 10 MEQ tablet Take 1 tablet (10 mEq total) by mouth 3 (three) times daily. Qty: 270 tablet, Refills: 1    warfarin (COUMADIN) 4 MG tablet Take 12 mg by mouth daily.    latanoprost (XALATAN) 0.005 % ophthalmic solution Place 1 drop into both eyes at bedtime.     lidocaine-prilocaine (EMLA) cream Apply to affected area once Qty: 30 g, Refills: 3   Associated Diagnoses: Breast cancer of upper-inner quadrant of right female breast ([name])    ondansetron (ZOFRAN) 8 MG tablet Take 1 tablet (8 mg total) by mouth 2 (two) times daily as needed for refractory nausea / vomiting. Start on day 3 after chemo. Qty: 30 tablet, Refills: 1   Associated Diagnoses: Breast cancer of upper-inner quadrant of right female breast ([name])    oxyCODONE (OXY IR/ROXICODONE) 5 MG immediate release tablet  Take 1-2 tablets (5-10 mg total) by mouth every 4 (four) hours as needed for moderate pain. Qty: 40 tablet, Refills: 0    prochlorperazine (COMPAZINE) 10 MG tablet Take 1 tablet (10 mg total) by mouth every 6 (six) hours as needed for nausea or vomiting. Qty: 30 tablet, Refills: 3    tizanidine (ZANAFLEX) 2 MG capsule Take 1-2 capsules (2-4 mg total) by mouth every 8 (eight) hours as needed for muscle spasms. Qty: 40 capsule, Refills: 1    triamcinolone cream (KENALOG) 0.1 % Apply 1 application topically 2 (two) times daily. If needed Qty: 45 g, Refills: 1        Today   VITAL SIGNS:  Blood pressure (!) 144/66, pulse 72, temperature (!) 97.4 F (36.3 C), temperature source Oral, resp. rate 16, height 5' 8"  (1.727 m), weight 101.9 kg (224 lb 11.2 oz), SpO2 93 %.  I/O:   Intake/Output Summary (Last 24 hours) at 05/24/17 1313 Last data filed at 05/24/17 0735  Gross per 24 hour  Intake  300 ml  Output                0 ml  Net              300 ml    PHYSICAL EXAMINATION:  Physical Exam  GENERAL:  66 y.o.-year-old patient lying in the bed with no acute distress.  Tracheostomy in place LUNGS: Normal breath sounds bilaterally, no wheezing, rales,rhonchi or crepitation. No use of accessory muscles of respiration.  CARDIOVASCULAR: S1, S2 normal. No murmurs, rubs, or gallops.  ABDOMEN: Soft, non-tender, non-distended. Bowel sounds present. No organomegaly or mass.  NEUROLOGIC: Moves all 4 extremities. PSYCHIATRIC: The patient is alert and oriented x 3.  SKIN: No obvious rash, lesion, or ulcer.   DATA REVIEW:   CBC  Recent Labs Lab 05/24/17 0330  WBC 8.9  HGB 8.4*  HCT 24.9*  PLT 15*    Chemistries   Recent Labs Lab 05/18/17 0442  05/22/17 1732  05/24/17 0330  NA 136  < > 137  < > 139  K 3.7  < > 4.1  < > 4.2  CL 103  < > 106  < > 105  CO2 21*  < > 21*  < > 25  GLUCOSE 122*  < > 100*  < > 107*  BUN 24*  < > 22*  < > 28*  CREATININE 1.61*  < > 1.63*   < > 2.00*  CALCIUM 10.6*  < > 10.5*  < > 10.3  MG 2.2  --   --   --   --   AST  --   --  257*  --   --   ALT  --   --  78*  --   --   ALKPHOS  --   --  146*  --   --   BILITOT  --   --  1.8*  --   --   < > = values in this interval not displayed.  Cardiac Enzymes  Recent Labs Lab 05/23/17 0837  TROPONINI <0.03    Microbiology Results  Results for orders placed or performed during the hospital encounter of 05/22/17  Urine Culture     Status: Abnormal   Collection Time: 05/22/17  5:32 PM  Result Value Ref Range Status   Specimen Description URINE, RANDOM  Final   Special Requests NONE  Final   Culture >=100,000 COLONIES/mL ESCHERICHIA COLI (A)  Final   Report Status 05/24/2017 FINAL  Final   Organism ID, Bacteria ESCHERICHIA COLI (A)  Final      Susceptibility   Escherichia coli - MIC*    AMPICILLIN >=32 RESISTANT Resistant     CEFAZOLIN <=4 SENSITIVE Sensitive     CEFTRIAXONE <=1 SENSITIVE Sensitive     CIPROFLOXACIN <=0.25 SENSITIVE Sensitive     GENTAMICIN <=1 SENSITIVE Sensitive     IMIPENEM <=0.25 SENSITIVE Sensitive     NITROFURANTOIN <=16 SENSITIVE Sensitive     TRIMETH/SULFA >=320 RESISTANT Resistant     AMPICILLIN/SULBACTAM 8 SENSITIVE Sensitive     PIP/TAZO <=4 SENSITIVE Sensitive     Extended ESBL NEGATIVE Sensitive     * >=100,000 COLONIES/mL ESCHERICHIA COLI    RADIOLOGY:  Ct Head Wo Contrast  Addendum Date: 05/22/2017   ADDENDUM REPORT: 05/22/2017 17:54 ADDENDUM: A more recent comparison examination from 05/13/2017 has now been made available. This was not available at the time of original interpretation. The small subdural along the falx cerebrum on the left as well  as along the left frontal lobe has resolved in the interval from the prior exam. The previously seen posterior parietal subdural collection on the left has resolved with no significant residual component. There is some questionable prominence of the CSF space in the left frontal region best seen  on image number 16 of series 2 which may represent a more chronic subdural hygroma. This was not well appreciated on the prior exam but has no acute component. No definitive acute hemorrhage is seen. These results were discussed by telephone at the time of interpretation on 05/22/2017 at 5:54 pm to Dr. Larae Grooms , who verbally acknowledged these results. Electronically Signed   By: Inez Catalina M.D.   On: 05/22/2017 17:54   Result Date: 05/22/2017 CLINICAL DATA:  Facial numbness EXAM: CT HEAD WITHOUT CONTRAST TECHNIQUE: Contiguous axial images were obtained from the base of the skull through the vertex without intravenous contrast. COMPARISON:  12/10/2008 FINDINGS: Brain: No evidence of acute infarction, hemorrhage, hydrocephalus, extra-axial collection or mass lesion/mass effect. Cavum septum pellucidum is noted. Vascular: No hyperdense vessel or unexpected calcification. Skull: Normal. Negative for fracture or focal lesion. Sinuses/Orbits: No acute finding. Other: None. IMPRESSION: No acute abnormality noted. Electronically Signed: By: Inez Catalina M.D. On: 05/22/2017 17:29   Dg Chest Port 1 View  Result Date: 05/23/2017 CLINICAL DATA:  Weakness headache and left facial numbness intermittently for the past 3 days. Raleson exam. History of CHF, metastatic breast malignancy, coronary artery disease, and CVA. EXAM: PORTABLE CHEST 1 VIEW COMPARISON:  Portable chest x-ray and chest CT scan of May 13, 2017. FINDINGS: The lungs are mildly hypoinflated. The interstitial markings are mildly increased but not greatly changed from the previous study. There is no significant pleural effusion. The cardiac silhouette is enlarged. The central pulmonary vascularity is prominent. The ICD is in stable position. The tracheostomy tube tip projects at the superior margin of the clavicular heads. The porta catheter tip projects over the proximal SVC. IMPRESSION: Mild hypoinflation. Probable low-grade compensated CHF. No  acute pneumonia. Electronically Signed   By: David  Martinique M.D.   On: 05/23/2017 07:15    Follow up with PCP in 1 week.  Management plans discussed with the patient, family and they are in agreement.  CODE STATUS:     Code Status Orders        Start     Ordered   05/22/17 2015  Full code  Continuous     05/22/17 2014    Code Status History    Date Active Date Inactive Code Status Order ID Comments User Context   05/13/2017  9:35 AM 05/18/2017 10:33 PM Full Code [id number]  Waldemar Dickens, MD Inpatient   12/13/2016  4:49 PM 12/18/2016  3:34 PM Full Code [id number]  Coralie Keens, MD Inpatient   09/25/2016 10:22 PM 09/26/2016  1:49 PM Full Code [id number]  Fritzi Mandes, MD Inpatient   07/18/2016  4:54 PM 07/19/2016  1:27 PM Full Code [id number]  Coralie Keens, MD Inpatient    Advance Directive Documentation     Most Recent Value  Type of Advance Directive  Healthcare Power of Attorney  Pre-existing out of facility DNR order (yellow form or pink MOST form)  -  "MOST" Form in Place?  -      TOTAL TIME TAKING CARE OF THIS PATIENT ON DAY OF DISCHARGE: more than 30 minutes.   Hillary Bow R M.D on 05/24/2017 at 1:13 PM  Between 7am to 6pm - Pager - [phone]  After 6pm go to [email or web address] - password [email or web address] [facility]  Office  [phone]  CC: Primary care physician; Arnetha Courser, MD  Note: This dictation was prepared with Dragon dictation along with smaller phrase technology. Any transcriptional errors that result from this process are unintentional.

## 2017-05-24 NOTE — Progress Notes (Signed)
Pt's family asked [place] to visit with pt. CH met with pt and a family member at bedside. Pt talked about her illness, her family, her faith and relationship with God; pt talked about things she value in life, and asked for prayers. CH offered sacred text reading from [place] 23 and Psalms 100, provided encouragement, prayers and a ministry of presence. Pt states she will be transferred to [facility] this afternoon.   05/24/17 1300  Clinical Encounter Type  Visited With Patient and family together  Visit Type Initial;Spiritual support;Other (Comment)  Referral From Family  Consult/Referral To Chaplain  Spiritual Encounters  Spiritual Needs Prayer

## 2017-05-25 ENCOUNTER — Encounter (HOSPITAL_COMMUNITY): Payer: Self-pay

## 2017-05-25 DIAGNOSIS — N390 Urinary tract infection, site not specified: Secondary | ICD-10-CM | POA: Diagnosis present

## 2017-05-25 DIAGNOSIS — Z515 Encounter for palliative care: Secondary | ICD-10-CM

## 2017-05-25 DIAGNOSIS — Z7189 Other specified counseling: Secondary | ICD-10-CM

## 2017-05-25 LAB — URINALYSIS, ROUTINE W REFLEX MICROSCOPIC
BACTERIA UA: NONE SEEN
Bilirubin Urine: NEGATIVE
GLUCOSE, UA: NEGATIVE mg/dL
KETONES UR: NEGATIVE mg/dL
NITRITE: NEGATIVE
PROTEIN: 30 mg/dL — AB
Specific Gravity, Urine: 1.016 (ref 1.005–1.030)
pH: 5 (ref 5.0–8.0)

## 2017-05-25 LAB — COMPREHENSIVE METABOLIC PANEL
ALT: 65 U/L — ABNORMAL HIGH (ref 14–54)
ANION GAP: 12 (ref 5–15)
AST: 230 U/L — ABNORMAL HIGH (ref 15–41)
Albumin: 3.4 g/dL — ABNORMAL LOW (ref 3.5–5.0)
Alkaline Phosphatase: 151 U/L — ABNORMAL HIGH (ref 38–126)
BILIRUBIN TOTAL: 1.2 mg/dL (ref 0.3–1.2)
BUN: 28 mg/dL — AB (ref 6–20)
CO2: 22 mmol/L (ref 22–32)
Calcium: 10.8 mg/dL — ABNORMAL HIGH (ref 8.9–10.3)
Chloride: 107 mmol/L (ref 101–111)
Creatinine, Ser: 1.88 mg/dL — ABNORMAL HIGH (ref 0.44–1.00)
GFR calc Af Amer: 31 mL/min — ABNORMAL LOW (ref >60)
GFR, EST NON AFRICAN AMERICAN: 27 mL/min — AB (ref >60)
Glucose, Bld: 101 mg/dL — ABNORMAL HIGH (ref 65–99)
POTASSIUM: 4 mmol/L (ref 3.5–5.1)
Sodium: 141 mmol/L (ref 135–145)
Total Protein: 7.5 g/dL (ref 6.5–8.1)

## 2017-05-25 LAB — PREPARE PLATELET PHERESIS: Unit division: 0

## 2017-05-25 LAB — CREATININE, URINE, RANDOM: CREATININE, URINE: 147.36 mg/dL

## 2017-05-25 LAB — CBC
HEMATOCRIT: 26.4 % — AB (ref 36.0–46.0)
HEMOGLOBIN: 8.6 g/dL — AB (ref 12.0–15.0)
MCH: 29.9 pg (ref 26.0–34.0)
MCHC: 32.6 g/dL (ref 30.0–36.0)
MCV: 91.7 fL (ref 78.0–100.0)
Platelets: 13 10*3/uL — CL (ref 150–400)
RBC: 2.88 MIL/uL — ABNORMAL LOW (ref 3.87–5.11)
RDW: 18.4 % — AB (ref 11.5–15.5)
WBC: 6.6 10*3/uL (ref 4.0–10.5)

## 2017-05-25 LAB — GLUCOSE, CAPILLARY: GLUCOSE-CAPILLARY: 106 mg/dL — AB (ref 65–99)

## 2017-05-25 LAB — BPAM PLATELET PHERESIS
Blood Product Expiration Date: 201808182359
ISSUE DATE / TIME: 201808170549
Unit Type and Rh: 8400

## 2017-05-25 LAB — TYPE AND SCREEN
ABO/RH(D): AB POS
ANTIBODY SCREEN: NEGATIVE

## 2017-05-25 LAB — FIBRINOGEN: FIBRINOGEN: 430 mg/dL (ref 210–475)

## 2017-05-25 MED ORDER — SENNOSIDES-DOCUSATE SODIUM 8.6-50 MG PO TABS: 1.0000 | ORAL_TABLET | Freq: Two times a day (BID) | ORAL | Status: DC | PRN

## 2017-05-25 MED ORDER — CEFTRIAXONE SODIUM 1 G IJ SOLR
1.0000 g | Freq: Every day | INTRAMUSCULAR | Status: DC
  Administered 2017-05-25: 1 g via INTRAVENOUS
  Filled 2017-05-25 (×4): qty 10

## 2017-05-25 MED ORDER — HYDRALAZINE HCL 25 MG PO TABS
25.0000 mg | ORAL_TABLET | Freq: Two times a day (BID) | ORAL | Status: DC
  Administered 2017-05-25 – 2017-05-26 (×2): 25 mg via ORAL
  Filled 2017-05-25 (×2): qty 1

## 2017-05-25 NOTE — Progress Notes (Signed)
[name], new admission refused to have her blood drawn for labs  and blood cultures. This patient is a transfer from [name] for thrombocytopenia with medical history significant of metastasized breast cancer. Informed this patient of the needs to monitor her lab valves and obtain blood cultures. Patient also have an order for cardiac  monitor and needs to be transfer to a telemetry floor. [name] remains non-compliance and continues  to refuse her labs to be draw and to be transfer. DR Hendricks Milo made aware of the above.

## 2017-05-25 NOTE — Progress Notes (Signed)
PROGRESS NOTE    AROURA VASUDEVAN  GYB:[id number] DOB: 27-Mar-1951 DOA: 05/24/2017 PCP: Arnetha Courser, MD    Brief Narrative: [name] is a 66 y.o. female with medical history significant of metastasized breast cancer diffuse bony metastases, hypertension, stroke, GERD, s/p of pacemaker placement, s/p of tracheostomy, CAD, gastric ulcer, dCHF, atrial fibrillation not on anticoagulants, brain aneurysm, subdural hematoma, tobacco abuse, CKD-3, who presents with thrombocytopenia.  Patient is transferred from [facility]. Pt has severe thrombocytopenia. Her platelet was 15,000, which improved to 17,000 after 1 unit of platelet transfusion. Workup was done in [facility] including CRP 7.5, LDH 959, ESR>140, ADAMT13>100, schistocytes on peripheral smear and DAT negative. Pt states that she saw very little blood when she wipes after using bathroom. She reports that she has intermittent numbness in multiple places over her body, including right face and bilateral legs. She also has generalized weakness. No facial droop, slurred speech, vision change or hearing loss. Pt was being treated for UTI in [place]. She still has increased urinary frequency, but no dysuria or burning on urination. Patient denies chest pain, shortness rest, cough, nausea, vomiting, diarrhea or abdominal pain.   Assessment & Plan:   Principal Problem:   Thrombocytopenia ([place]) Active Problems:   Hypertension goal BP (blood pressure) < 140/90   Artificial cardiac pacemaker   Tracheostomy status ([name])   Atrial fibrillation, chronic ([name])   CAD in native artery   Acute renal failure superimposed on stage 3 chronic kidney disease ([name])   Breast cancer of upper-inner quadrant of right female breast ([place])   Anemia due to antineoplastic chemotherapy   GERD (gastroesophageal reflux disease)   UTI (urinary tract infection)  Severe thrombocytopenia;  Thought to be related tp bone marrow  involvement by malignancy.  Transfuse if platelet count less than 10,000 Appreciate Dr Benay Spice help.   HTN;  Agree with holding Cozaar and HCTZ due to AKI Continue with Norvasc.   Tracheostomy status ([place]): no acute issues -RT consult for  management  Atrial fibrillation, chronic ([place]): CHA2DS2-VASc Score is 6, needs oral anticoagulation. Patient is on Coumadin at home. INR is pending on admission. Heart rate is well controlled. Pt is not on BB or CCB -will hold coumadin due to high risk of bleeding 2/2 severe thrombocytopenia  Hx of CAD: No chest pain -Bp control -monitoring any CP  AoCKD-III: Baseline Cre is 1.6, pt's Cre is 2.00 on admission. Likely due to prerenal secondary to dehydration and continuation of ARB, diuretics. - IVF: 75 cc/h of NS - Hold HCTZ and cozarr  Breast cancer of upper-inner quadrant of right female breast [facility]): with diffused metastasis to bone. S/p of mastectomy bilaterally. -continue letrozole -f/u with oncology  Anemia due to antineoplastic chemotherapy: Hgb 8.4. Per report, pt is status post 1 unit of packed red blood cell transfusion in [place] by CBC  Intermittent numbness in multiple areas: Etiology unclear. Neurology was consulted in [facility]. Per Dr. 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 Pt had negative CT-head on 05/22/17. Cannot to MRI of brain due to presents of pacemaker -Increaseddose of Neurontin as recommended by neurology   UTI: -ON ceftriaxone -F/U Bx and Ux urine culture from 8-15 grew E coli. Sensitive to ceftriaxone.   GERD: -Protonix    DVT prophylaxis: SCD.  Code Status: full code.  Family Communication: no family at bedside.  Disposition Plan: to be determine./  Consultants:   Dr Benay Spice.    Procedures: none   Antimicrobials:  none   Subjective: Patient refuse to be  examined. She is complaining of feeling cold. She understand that her platelet are low and is dangerous.  She wants to go home. I offer palliative care consult. She agrees to stay until tomorrow, to have her platelet check.  She refuse physical examination.    Objective: Vitals:   05/24/17 1824 05/24/17 2007 05/25/17 0558  BP: (!) 141/81 137/81 136/85  Pulse: 70 71 73  Resp: _0 Temp: 97.8 F (36.6 C) 98 F (36.7 C) 98 F (36.7 C)  TempSrc: Oral Oral Oral  SpO2: 98% 98% 95%    Intake/Output Summary (Last 24 hours) at 05/25/17 0834 Last data filed at 05/24/17 2330  Gross per 24 hour  Intake              120 ml  Output                0 ml  Net              120 ml   There were no vitals filed for this visit.  Examination:  Refuse to be examined.   Data Reviewed: I have personally reviewed following labs and imaging studies  CBC:  Recent Labs Lab 05/22/17 1732 05/23/17 0420 05/23/17 2124 05/24/17 0330 05/24/17 1919 05/25/17 0619  WBC 6.1 6.7  --  8.9 6.2 6.6  NEUTROABS 2.6  --   --   --  2.2  --   HGB 8.7* 8.0*  --  8.4* 8.5* 8.6*  HCT 26.0* 23.5*  --  24.9* 25.6* 26.4*  MCV 89.5 88.6  --  89.6 90.8 91.7  PLT 17* 13* 19* 15* 16* 13*   Basic Metabolic Panel:  Recent Labs Lab 05/22/17 1732 05/23/17 0420 05/24/17 0330 05/24/17 2000 05/25/17 0619  NA 137 137 139 140 141  K 4.1 3.8 4.2 3.9 4.0  CL 106 107 105 106 107  CO2 21* _1 GLUCOSE 100* 124* 107* 107* 101*  BUN 22* 23* 28* 27* 28*  CREATININE 1.63* 1.65* 2.00* 1.79* 1.88*  CALCIUM 10.5* 10.2 10.3 10.9* 10.8*   GFR: Estimated Creatinine Clearance: 37.8 mL/min (A) (by C-G formula based on SCr of 1.88 mg/dL (H)). Liver Function Tests:  Recent Labs Lab 05/22/17 1732 05/24/17 2000 05/25/17 0619  AST 257* 234* 230*  ALT 78* 67* 65*  ALKPHOS 146* 145* 151*  BILITOT 1.8* 1.2 1.2  PROT 7.4 7.6 7.5  ALBUMIN 3.4* 3.4* 3.4*   No results for input(s): LIPASE, AMYLASE in the last 168  hours. No results for input(s): AMMONIA in the last 168 hours. Coagulation Profile:  Recent Labs Lab 05/22/17 1732 05/24/17 2000  INR 1.31 1.19   Cardiac Enzymes:  Recent Labs Lab 05/22/17 1732 05/22/17 2016 05/23/17 0019 05/23/17 0420 05/23/17 0837  TROPONINI 0.03* 0.03* <0.03 <0.03 <0.03   BNP (last 3 results) No results for input(s): PROBNP in the last 8760 hours. HbA1C: No results for input(s): HGBA1C in the last 72 hours. CBG:  Recent Labs Lab 05/18/17 1204 05/18/17 1623 05/23/17 0750 05/24/17 0737 05/25/17 0801  GLUCAP 109* 106* 119* 96 106*   Lipid Profile: No results for input(s): CHOL, HDL, LDLCALC, TRIG, CHOLHDL, LDLDIRECT in the last 72 hours. Thyroid Function Tests:  Recent Labs  05/23/17 0420  TSH 1.290   Anemia Panel:  Recent Labs  05/23/17 0420 05/23/17 0837 05/23/17  1602 05/24/17 1919  VITAMINB12  --   --  1,022*  --   FOLATE 13.4  --   --   --   RETICCTPCT  --  2.8  --  3.5*   Sepsis Labs: No results for input(s): PROCALCITON, LATICACIDVEN in the last 168 hours.  Recent Results (from the past 240 hour(s))  Urine Culture     Status: Abnormal   Collection Time: 05/22/17  5:32 PM  Result Value Ref Range Status   Specimen Description URINE, RANDOM  Final   Special Requests NONE  Final   Culture >=100,000 COLONIES/mL ESCHERICHIA COLI (A)  Final   Report Status 05/24/2017 FINAL  Final   Organism ID, Bacteria ESCHERICHIA COLI (A)  Final      Susceptibility   Escherichia coli - MIC*    AMPICILLIN >=32 RESISTANT Resistant     CEFAZOLIN <=4 SENSITIVE Sensitive     CEFTRIAXONE <=1 SENSITIVE Sensitive     CIPROFLOXACIN <=0.25 SENSITIVE Sensitive     GENTAMICIN <=1 SENSITIVE Sensitive     IMIPENEM <=0.25 SENSITIVE Sensitive     NITROFURANTOIN <=16 SENSITIVE Sensitive     TRIMETH/SULFA >=320 RESISTANT Resistant     AMPICILLIN/SULBACTAM 8 SENSITIVE Sensitive     PIP/TAZO <=4 SENSITIVE Sensitive     Extended ESBL NEGATIVE Sensitive      * >=100,000 COLONIES/mL ESCHERICHIA COLI         Radiology Studies: No results found.      Scheduled Meds: . amLODipine  10 mg Oral Daily  . gabapentin  300 mg Oral TID  . latanoprost  1 drop Both Eyes QHS  . letrozole  2.5 mg Oral Daily  . levETIRAcetam  500 mg Oral BID  . nicotine  21 mg Transdermal Q24H  . pantoprazole  40 mg Oral Daily   Continuous Infusions: . sodium chloride 75 mL/hr at 05/24/17 2330  . cefTRIAXone (ROCEPHIN)  IV       LOS: 1 day    Time spent: 25 minutes.     Elmarie Shiley, MD [facility] Pager [phone]  If 7PM-7AM, please contact night-coverage [email or web address] Password [facility] 05/25/2017, 8:34 AM

## 2017-05-25 NOTE — Consult Note (Signed)
Consultation Note Date: 05/25/2017   Patient Name: [name]  DOB: 1951/08/15  MRN: [id number]  Age / Sex: 66 y.o., female  PCP: Arnetha Courser, MD Referring Physician: Elmarie Shiley, MD  Reason for Consultation: Establishing goals of care  HPI/Patient Profile: 66 y.o. female  with past medical history of metastatic breast cancer, thrombocytopenia, htn, stroke, gerd, tracheostomy, CAD, a fib, CHF, subdural hematoma, CKD admitted on 05/24/2017 with thrombocytopenia.  Palliative consulted for goals of care.   Clinical Assessment and Goals of Care: I met today with Ms. Towe and her neighbor. Values and goals of care important to patient and family were attempted to be elicited.  We discussed clinical course as well as wishes moving forward in regard to advanced directives.  Concepts specific to code status and rehospitalization discussed.  We discussed difference between a aggressive medical intervention path and a palliative, comfort focused care path.    Concept of Hospice and Palliative Care were discussed  Questions and concerns addressed.   PMT will continue to support holistically.   SUMMARY OF RECOMMENDATIONS I had a long discussion with Ms. Goodroe with her neighbor present.  We then reviewed care plan below with her bedside RN, Jenny Reichmann, present as well.   - Discussed CODE STATUS and Ms. Byron is clear that she would not want aggressive interventions in the event of cardiac or respiratory arrest.  I changed her code status to DNR. - She is adamant that her home is the only discharge that she will find acceptable.  She is also very clear that her greatest wish is to die in her own home. - We discussed options to be at home will likely either be 1) with hospice (although she lives alone and I am unclear about her ability to have 24 hours support from family/friends/hired caregivers) or 2) home  with home health with care plan similar to one prior to admission (lab monitoring with intermittent transfusions as needed.) - Dr. Lindi Adie is her primary oncologist.  She trusts him and want his opinion when he returns next week prior to making any further decisions.  We did discuss hospice today as an option if she desires on discharge.    Code Status/Advance Care Planning: DNR   Prognosis:   guarded  Discharge Planning: To be determined.  She is adamant that discharge to her home is the only acceptable plan.  Question if plan will be with [facility] vs Hospice     Primary Diagnoses: Present on Admission: . Anemia due to antineoplastic chemotherapy . Artificial cardiac pacemaker . Atrial fibrillation, chronic ([place]) . Breast cancer of upper-inner quadrant of right female breast ([place]) . CAD in native artery . Acute renal failure superimposed on stage 3 chronic kidney disease ([place]) . Hypertension goal BP (blood pressure) < 140/90 . Thrombocytopenia ([place]) . GERD (gastroesophageal reflux disease) . UTI (urinary tract infection)   I have reviewed the medical record, interviewed the patient and family, and examined the patient. The following aspects are pertinent.  Past Medical  History:  Diagnosis Date  . Aneurysm ([place]) 1998   Brain  . Anticoagulated on Coumadin   . Arthritis   . Atherosclerosis of coronary artery of native heart without angina pectoris   . Atrial fibrillation, chronic ([place])   . 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 Breast cancer ([place]) 06/21/2016   Right breast  . CHF (congestive heart failure) ([place])   . Chronic hypokalemia   . Chronic kidney disease    stage III  . Chronic pain due to trauma   . Difficult intubation    permanent tracheostomy;lungs filled with water when extubated  . Frequent headaches   . GERD (gastroesophageal reflux disease)   . History of stomach ulcers   . Hypertension   . Hypertension, goal below 140/90   . Myocardial infarction ([place])   . PONV  (postoperative nausea and vomiting)   . 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 Spinal headache   . Stroke [facility])    ?per patient; history transverse venous sinus thrombosis 2005 (evaluated at [facility]; lifelong anticoagulation recommended)  . Trachea, ring   . Tracheostomy in place [facility])   . Urinary tract infection, recurrent   . Urine incontinence    Social History   Social History  . Marital status: Single    Spouse name: N/A  . Number of children: N/A  . Years of education: N/A   Occupational History  . disabled    Social History Main Topics  . Smoking status: Current Some Day Smoker    Years: 40.00    Types: Cigarettes  . Smokeless tobacco: Never Used     Comment: 3 a week.  pt can't remember when she had one last  . Alcohol use No  . Drug use: No  . Sexual activity: Yes   Other Topics Concern  . Not on file   Social History Narrative  . No narrative on file   Family History  Problem Relation Age of Onset  . Heart disease Mother   . Hypertension Mother   . Anemia Mother   . Heart disease Father   . Hypertension Father   . Anemia Father   . Cancer Unknown   . Heart disease Sister   . Heart disease Brother   . Mental illness Brother    Scheduled Meds: . amLODipine  10 mg Oral Daily  . gabapentin  300 mg Oral TID  . latanoprost  1 drop Both Eyes QHS  . letrozole  2.5 mg Oral Daily  . levETIRAcetam  500 mg Oral BID  . nicotine  21 mg Transdermal Q24H  . pantoprazole  40 mg Oral Daily   Continuous Infusions: . sodium chloride 75 mL/hr at 05/24/17 2330  . cefTRIAXone (ROCEPHIN)  IV     PRN Meds:.acetaminophen **OR** acetaminophen, hydrALAZINE, lidocaine-prilocaine, senna-docusate, zolpidem Medications Prior to Admission:  Prior to Admission medications   Medication Sig Start Date End Date Taking? Authorizing Provider  gabapentin (NEURONTIN) 300 MG capsule Take 1 capsule (300 mg total) by mouth 2 (two) times daily. Patient taking  differently: Take 300 mg by mouth 3 (three) times daily.  05/18/17  Yes Mariel Aloe, MD  hydrochlorothiazide (HYDRODIURIL) 12.5 MG tablet Take 12.5 mg by mouth daily. 02/28/17  Yes [provider]  latanoprost (XALATAN) 0.005 % ophthalmic solution Place 1 drop into both eyes at bedtime.  05/04/10  Yes [provider]  letrozole (FEMARA) 2.5 MG tablet Take 1 tablet (2.5 mg total) by mouth  daily. 12/24/16  Yes Nicholas Lose, MD  levETIRAcetam (KEPPRA) 500 MG tablet Take 1 tablet (500 mg total) by mouth 2 (two) times daily. 05/18/17  Yes Mariel Aloe, MD  losartan (COZAAR) 100 MG tablet Take 1 tablet (100 mg total) by mouth daily. Patient taking differently: Take 100 mg by mouth at bedtime.  08/02/16  Yes Lada, Satira Anis, MD  potassium chloride (KLOR-CON M10) 10 MEQ tablet Take 1 tablet (10 mEq total) by mouth 3 (three) times daily. 07/06/16  Yes Lada, Satira Anis, MD  hydrochlorothiazide (HYDRODIURIL) 25 MG tablet Take 25 mg by mouth daily.    [provider]  lidocaine-prilocaine (EMLA) cream Apply to affected area once Patient taking differently: Apply 1 application topically daily as needed (prior to port access). Apply to affected area once 07/05/16   Nicholas Lose, MD  pantoprazole (PROTONIX) 40 MG tablet TAKE 1 TABLET BY MOUTH EVERY DAY 02/25/17   Lada, Satira Anis, MD  warfarin (COUMADIN) 4 MG tablet TAKE 3 TABLETS (12 MG TOTAL) BY MOUTH DAILY. OR AS OTHERWISE DIRECTED BY YOUR DOCTOR 02/28/17   [provider]   Allergies  Allergen Reactions  . Lisinopril Cough  . Eggs Or Egg-Derived Products Rash    Raw eggs ONLY (boiled, scrambled, etc)  If baked into something it is NOT a issue   Review of Systems  Constitutional: Positive for activity change.  Neurological: Positive for weakness.   Physical Exam  General: Alert, awake, in no acute distress.  HEENT: No bruits, no goiter, no JVD.  Trach in place Heart: Regular rate and rhythm. No murmur  appreciated. Lungs: Good air movement, clear Abdomen: Soft, nontender, nondistended, positive bowel sounds.  Ext: No significant edema Skin: Warm and dry  Vital Signs: BP 136/85 (BP Location: Left Arm)   Pulse 73   Temp 98 F (36.7 C) (Oral)   Resp 16   SpO2 95%          SpO2: SpO2: 95 % O2 Device:SpO2: 95 % O2 Flow Rate: .   IO: Intake/output summary:  Intake/Output Summary (Last 24 hours) at 05/25/17 1240 Last data filed at 05/24/17 2330  Gross per 24 hour  Intake              120 ml  Output                0 ml  Net              120 ml    LBM:   Baseline Weight:   Most recent weight:       Palliative Assessment/Data:   Flowsheet Rows     Most Recent Value  Intake Tab  Referral Department  Hospitalist  Unit at Time of Referral  Oncology Unit  Palliative Care Primary Diagnosis  Cancer  Date Notified  05/25/17  Palliative Care Type  New Palliative care  Reason for referral  Clarify Goals of Care  Date of Admission  05/24/17  Date first seen by Palliative Care  05/25/17  # of days Palliative referral response time  0 Day(s)  # of days IP prior to Palliative referral  1  Clinical Assessment  Palliative Performance Scale Score  30%  Psychosocial & Spiritual Assessment  Palliative Care Outcomes  Patient/Family meeting held?  Yes  Who was at the meeting?  Patient, neighbor      Time In: 1100 Time Out: 1215 Time Total: 75 Greater than 50%  of this time was spent counseling and  coordinating care related to the above assessment and plan.  Signed by: Micheline Rough, MD   Please contact Palliative Medicine Team phone at [phone] for questions and concerns.  For individual provider: See Shea Evans

## 2017-05-25 NOTE — Progress Notes (Signed)
PT Cancellation Note  Patient Details Name: [name] MRN: [id number] DOB: 11-Jan-1951   Cancelled Treatment:    Reason Eval/Treat Not Completed: Patient declined, no reason specified. Pt requested PT check back another day.    Weston Anna, MPT Pager: [phone]

## 2017-05-25 NOTE — Progress Notes (Signed)
IP PROGRESS NOTE  Subjective:   Ms. Lorenzi reports feeling cold. No bleeding.   She was able to ambulate last night. Objective: Vital signs in last 24 hours: Blood pressure 136/85, pulse 73, temperature 98 F (36.7 C), temperature source Oral, resp. rate 16, SpO2 95 %.  Intake/Output from previous day: 08/17 0701 - 08/18 0700 In: 120 [P.O.:120] Out: -   Physical Exam:  HEENT: No thrush or bleeding Lungs: Decreased breath sounds at the left lower posterior chest, no respiratory distress Cardiac: Regular rate and rhythm Extremities: No leg edema Skin: Ecchymoses at the left upper medial arm, no petechiae Neurologic: Alert and oriented, the motor exam appears intact in the upper extremity bilaterally. Weakness at the feet with plantar/dorsi flexion. Strength intact with knee extension and hip flexion.vThe face is symmetric. The speech is fluent. Breasts: Status post bilateral mastectomy-several 2 mm cutaneous nodules inferior to the right mastectomy scar  Portacath/PICC-without erythema  Lab Results:  Recent Labs  05/24/17 1919 05/25/17 0619  WBC 6.2 6.6  HGB 8.5* 8.6*  HCT 25.6* 26.4*  PLT 16* 13*   Tickle site count-98.7 BMET  Recent Labs  05/24/17 2000 05/25/17 0619  NA 140 141  K 3.9 4.0  CL 106 107  CO2 24 22  GLUCOSE 107* 101*  BUN 27* 28*  CREATININE 1.79* 1.88*  CALCIUM 10.9* 10.8*   ADAMS13 activity on 05/23/2017-greater than 100  Calcium 10.8, albumin 3.4 , bilirubin 1.2   Medications: I have reviewed the patient's current medications.  Assessment/Plan:  1. 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 Severe anemia/thrombocytopenia 3. Chronic renal failure 4. Subdural hemorrhage 05/13/2017 after presenting with lower extremity weakness 5. History of neutropenia while on palbociclib, July 2018-resolved 6. Behcet's syndrome 7.  History of congestive heart failure 8. Status post tracheostomy 9. History of atrial fibrillation 10. Mild hypercalcemia of malignancy-asymptomatic  Ms. Medine has persistent severe thrombocytopenia. No bleeding. The thrombocytopenia is most likely related to bone marrow involvement by tumor. The peripheral blood smear from last night is consistent with a leukoerythroblastic smear.  Treatment options for the breast cancer are limited in the setting of severe thrombocytopenia. Dr. Lindi Adie may consider a trial of weekly Taxol or Xeloda.  I discussed CPR and ACLS issues with Ms. Rappleye. She will remain on a full CODE STATUS for now. I think she will benefit from a palliative care consult.  The foot weakness may be related to the recent subdural hemorrhage, carcinomatous meningitis, or another etiology.    Recommendations: 1. Transfuse platelets for bleeding or a count of less than 10,000 2. Consider diagnostic bone marrow biopsy 05/27/2017 3. Palliative care consult to help establish goals of care and CODE STATUS 4. Physical therapy evaluation  I will continue following her with the medical service over the weekend. Dr. Lindi Adie will return on 05/27/2017.   LOS: 1 day   Donneta Romberg, MD   05/25/2017, 7:42 AM  Blood Smear 8/17:platelets are markedly decreased, no clumps. Increased polychromasia. Mature Nrbcs and more immature nucleated rbc precursors.Teardrops, few helmets, rare schistocyte. Bands, few myelocytes.  Appearance consistent with leukoerythroblastic smear.

## 2017-05-25 NOTE — Progress Notes (Signed)
Pt confirmed that she was on continuous pulseox earlier today, but now does not want to wear it because it hurts her finger.  RN aware.  RN/RT will continue to monitor patient.

## 2017-05-26 ENCOUNTER — Other Ambulatory Visit: Payer: Self-pay | Admitting: Oncology

## 2017-05-26 LAB — BASIC METABOLIC PANEL
ANION GAP: 9 (ref 5–15)
BUN: 27 mg/dL — AB (ref 6–20)
CO2: 23 mmol/L (ref 22–32)
Calcium: 10.7 mg/dL — ABNORMAL HIGH (ref 8.9–10.3)
Chloride: 107 mmol/L (ref 101–111)
Creatinine, Ser: 1.86 mg/dL — ABNORMAL HIGH (ref 0.44–1.00)
GFR calc Af Amer: 32 mL/min — ABNORMAL LOW (ref >60)
GFR, EST NON AFRICAN AMERICAN: 28 mL/min — AB (ref >60)
GLUCOSE: 101 mg/dL — AB (ref 65–99)
POTASSIUM: 3.9 mmol/L (ref 3.5–5.1)
Sodium: 139 mmol/L (ref 135–145)

## 2017-05-26 LAB — GLUCOSE, CAPILLARY: Glucose-Capillary: 95 mg/dL (ref 65–99)

## 2017-05-26 LAB — CBC
HEMATOCRIT: 23.6 % — AB (ref 36.0–46.0)
Hemoglobin: 7.8 g/dL — ABNORMAL LOW (ref 12.0–15.0)
MCH: 30.2 pg (ref 26.0–34.0)
MCHC: 33.1 g/dL (ref 30.0–36.0)
MCV: 91.5 fL (ref 78.0–100.0)
PLATELETS: 9 10*3/uL — AB (ref 150–400)
RBC: 2.58 MIL/uL — AB (ref 3.87–5.11)
RDW: 18.6 % — AB (ref 11.5–15.5)
WBC: 5.8 10*3/uL (ref 4.0–10.5)

## 2017-05-26 LAB — URINE CULTURE

## 2017-05-26 LAB — UREA NITROGEN, URINE: Urea Nitrogen, Ur: 733 mg/dL

## 2017-05-26 MED ORDER — HEPARIN SOD (PORK) LOCK FLUSH 100 UNIT/ML IV SOLN
500.0000 [IU] | INTRAVENOUS | Status: AC | PRN
  Administered 2017-05-26: 500 [IU]

## 2017-05-26 MED ORDER — AMLODIPINE BESYLATE 10 MG PO TABS: 10.0000 mg | ORAL_TABLET | Freq: Every day | ORAL | 0 refills | Status: AC

## 2017-05-26 MED ORDER — GABAPENTIN 300 MG PO CAPS: 300.0000 mg | ORAL_CAPSULE | Freq: Three times a day (TID) | ORAL | 0 refills | Status: AC

## 2017-05-26 MED ORDER — SENNOSIDES-DOCUSATE SODIUM 8.6-50 MG PO TABS: 1.0000 | ORAL_TABLET | Freq: Two times a day (BID) | ORAL | 0 refills | Status: AC | PRN

## 2017-05-26 MED ORDER — SODIUM CHLORIDE 0.9 % IV SOLN: Freq: Once | INTRAVENOUS | Status: DC

## 2017-05-26 NOTE — Discharge Summary (Signed)
Physician Discharge Summary  ROXIE KREEGER XBM:[id number] DOB: [birth year] DOA: 05/24/2017  PCP: Arnetha Courser, MD  Admit date: 05/24/2017 Discharge date: 05/26/2017  Admitted From: Home  Disposition: Home   Recommendations for Outpatient Follow-up:  1. Follow up with PCP in 1-2 weeks 2. Please obtain BMP/CBC in 3 days  3. Needs to follow up with Dr Lindi Adie for goals of care.    Home Health: Resume [facility]  Discharge Condition: Stable.  CODE STATUS: DNR Diet recommendation: Heart Healthy  Brief/Interim Summary: SUZANNAH BETTES a 66 y.o.femalewith medical history significant of metastasized breast cancer diffuse bony metastases, hypertension, stroke, GERD, s/p of pacemaker placement, s/p of tracheostomy, CAD, gastric ulcer, dCHF, atrial fibrillation not on anticoagulants, brain aneurysm, subdural hematoma, tobacco abuse, CKD-3, who presents with thrombocytopenia.  Patient is transferred from [facility]. Pt has severe thrombocytopenia. Her platelet was 15,000, which improved to 17,000 after 1 unit of platelet transfusion. Workup was done in [facility] including CRP 7.5, LDH 959, ESR>140, ADAMT13>100, schistocytes on peripheral smear andDAT negative. Pt states that she saw very little blood when she wipes after using bathroom. She reports that she has intermittent numbness in multiple places over her body, including right face and bilateral legs. She also has generalized weakness. No facial droop, slurred speech, vision change or hearing loss. Pt was being treated for UTI in [place]. She still has increased urinary frequency, but no dysuria or burning on urination. Patient denies chest pain, shortness rest, cough, nausea, vomiting, diarrhea or abdominal pain.   Assessment & Plan:   Principal Problem:   Thrombocytopenia ([place]) Active Problems:   Hypertension goal BP (blood pressure) < 140/90   Artificial cardiac pacemaker   Tracheostomy status ([name])    Atrial fibrillation, chronic ([name])   CAD in native artery   Acute renal failure superimposed on stage 3 chronic kidney disease ([name])   Breast cancer of upper-inner quadrant of right female breast ([place])   Anemia due to antineoplastic chemotherapy   GERD (gastroesophageal reflux disease)   UTI (urinary tract infection)  Severe thrombocytopenia;  Thought to be related tp bone marrow involvement by malignancy.  Transfuse if platelet count less than 10,000 Appreciate Dr Benay Spice help.   HTN;  Agree with holding Cozaar and HCTZ due to AKI Continue with Norvasc.   Tracheostomy status ([place]): no acute issues -RT consult for management  Atrial fibrillation, chronic ([name]):CHA2DS2-VASc Scoreis 6, needs oral anticoagulation. Patient is on Coumadin at home. INR is pending on admission. Heart rate is well controlled. Pt is not on BB or CCB -will hold coumadin due to high risk of bleeding 2/2 severe thrombocytopenia -stop coumadin at discharge   Hx of CAD:No chest pain -Bp control -monitoring any CP  AoCKD-III: Baseline Cre is 1.6, pt's Cre is 2.00on admission. Likely due to prerenal secondary to dehydration and continuation of ARB, diuretics. - IVF: 75 cc/h of NS - Hold HCTZ and cozarr  Breast cancer of upper-inner quadrant of right female breast ([place]): with diffusedmetastasis to bone. S/p of mastectomy bilaterally. -continue letrozole -f/u with oncology -patient to follow up with Dr Lindi Adie for goals of care.   Anemia due to antineoplastic chemotherapy: Hgb 8.4. Per report, pt is status post 1 unit of packed red blood cell transfusion in [place] by CBC  Intermittent numbness in multiple areas:Etiology unclear. Neurology was consulted in [facility]. Per Dr. 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  Pt had negative CT-head on 05/22/17. Cannot to MRI of  brain due to presents of pacemaker -Increaseddose of Neurontin as recommended by neurology   UTI: -ON ceftriaxone. Received 4 days treatment, She received 2 days of antibiotics at [facility]/  urine culture from 8-15 grew E coli. Sensitive to ceftriaxone.   GERD: -stop protonix due to thrombocytopenia.   Discharge Diagnoses:  Principal Problem:   Thrombocytopenia ([place]) Active Problems:   Hypertension goal BP (blood pressure) < 140/90   Artificial cardiac pacemaker   Tracheostomy status ([name])   Atrial fibrillation, chronic ([name])   CAD in native artery   Acute renal failure superimposed on stage 3 chronic kidney disease ([name])   Breast cancer of upper-inner quadrant of right female breast ([place])   Anemia due to antineoplastic chemotherapy   GERD (gastroesophageal reflux disease)   UTI (urinary tract infection)    Discharge Instructions  Discharge Instructions    Diet - low sodium heart healthy    Complete by:  As directed    Increase activity slowly    Complete by:  As directed      Allergies as of 05/26/2017      Reactions   Lisinopril Cough   Eggs Or Egg-derived Products Rash   Raw eggs ONLY (boiled, scrambled, etc)  If baked into something it is NOT a issue      Medication List    STOP taking these medications   hydrochlorothiazide 12.5 MG tablet Commonly known as:  HYDRODIURIL   hydrochlorothiazide 25 MG tablet Commonly known as:  HYDRODIURIL   losartan 100 MG tablet Commonly known as:  COZAAR   pantoprazole 40 MG tablet Commonly known as:  PROTONIX   potassium chloride 10 MEQ tablet Commonly known as:  KLOR-CON M10   warfarin 4 MG tablet Commonly known as:  COUMADIN     TAKE these medications   amLODipine 10 MG tablet Commonly known as:  NORVASC Take 1 tablet (10 mg total) by mouth daily.   gabapentin 300 MG capsule Commonly known as:  NEURONTIN Take 1 capsule (300 mg total) by mouth 3 (three) times daily.   latanoprost 0.005 %  ophthalmic solution Commonly known as:  XALATAN Place 1 drop into both eyes at bedtime.   letrozole 2.5 MG tablet Commonly known as:  FEMARA Take 1 tablet (2.5 mg total) by mouth daily.   levETIRAcetam 500 MG tablet Commonly known as:  KEPPRA Take 1 tablet (500 mg total) by mouth 2 (two) times daily.   lidocaine-prilocaine cream Commonly known as:  EMLA Apply to affected area once What changed:  how much to take  how to take this  when to take this  reasons to take this  additional instructions   senna-docusate 8.6-50 MG tablet Commonly known as:  Senokot-S Take 1 tablet by mouth 2 (two) times daily as needed for mild constipation.       Allergies  Allergen Reactions  . Lisinopril Cough  . Eggs Or Egg-Derived Products Rash    Raw eggs ONLY (boiled, scrambled, etc)  If baked into something it is NOT a issue    Consultations: Dr Benay Spice.   Procedures/Studies: Ct Abdomen Pelvis Wo Contrast  Result Date: 05/13/2017 CLINICAL DATA:  Initial evaluation for acute right-sided chest pain, extending into right and left sites of face with numbness and tingling in right leg. EXAM: CT CHEST, ABDOMEN AND PELVIS WITHOUT CONTRAST CT OF THE THORACIC AND LUMBAR SPINE WITHOUT CONTRAST. TECHNIQUE: Multidetector CT imaging of the chest, abdomen  and pelvis as well as the thoracic and lumbar spine was performed following the standard protocol without IV contrast. COMPARISON:  Prior radiograph from earlier same day. FINDINGS: CT CHEST FINDINGS Cardiovascular: Intrathoracic aorta of normal caliber. Scattered atheromatous plaque within the aortic arch. Partially visualized great vessels within normal limits. Heart size within normal limits. No pericardial effusion. Left-sided pacemaker/ AICD in place. Right-sided Port-A-Cath in place as well. Mediastinum/Nodes: Thyroid normal. No pathologically enlarged mediastinal, hilar, or axillary lymph nodes identified. Sequelae of prior right axillary  dissection noted. Esophagus within normal limits. Lungs/Pleura: Tracheostomy tube in place within the upper airway. Tracheobronchial tree patent. Hazy atelectatic changes present dependently within the lower lobes bilaterally. No focal infiltrates. No pulmonary edema or pleural effusion. No pneumothorax. No worrisome pulmonary nodule or mass. Musculoskeletal: Sequelae of prior mastectomy noted. Scattered lytic and sclerotic changes within the visualized spine, likely related osseous metastases. No acute osseous abnormality. Cervical ACDF noted. CT ABDOMEN PELVIS FINDINGS Hepatobiliary: Limited noncontrast evaluation liver unremarkable. Gallbladder within normal limits. No biliary dilatation. Pancreas: Pancreas within normal limits. Spleen: Spleen within normal limits. Adrenals/Urinary Tract: 2.5 cm intermediate density right adrenal nodule, indeterminate. Adrenal glands otherwise unremarkable. Right kidneys somewhat atrophic as compared to the left. Punctate 2 mm nonobstructive stone present within the lower pole the right kidney. No other radiopaque calculi. No hydronephrosis or hydroureter. Bladder within normal limits. Stomach/Bowel: Stomach within normal limits. No evidence for bowel obstruction. Mild colonic diverticulosis without evidence for acute diverticulitis. No acute inflammatory changes seen about the bowels. No findings to suggest acute appendicitis. 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 No aneurysm. No adenopathy. Flattening of the IVC, suggesting dehydration/ hypovolemia. Reproductive: Uterus is absent. Left ovary within normal limits. Right ovary not discretely identified. Other: No free air or fluid. Diastasis of the rectus abdominis musculature with small fat containing paraumbilical hernia. Musculoskeletal: Diffuse sclerosis with lucent reasons throughout the visualized osseous structures, likely related to osseous metastases. No acute osseous abnormality.  CT THORACIC SPINE: Mild dextroscoliosis. Vertebral bodies otherwise normally aligned with preservation of the normal thoracic kyphosis. No listhesis. Cervical ACDF noted. Diffuse abnormal appearance of the visualized osseous structures, likely related to diffuse osseous metastases. No associated pathologic fracture. No appreciable extraosseous tumor or significant epidural tumor. Paraspinous soft tissues within normal limits. Multilevel degenerative endplate spurring seen throughout the thoracic spine, greatest within the midthoracic spine. Scattered posterior endplate marginal osteophytic spurring noted as well. No significant canal stenosis. Mild to moderate bilateral foraminal narrowing noted within the lower thoracic spine at T10-11, T11-12, and T12-L1. CT LUMBAR SPINE: Normal segmentation. Lowest well-formed disc labeled the L5-S1 level. 4 mm anterolisthesis of L3 on L4 and 2 mm anterolisthesis of L4 on L5. Vertebral bodies otherwise normally aligned with preservation of the normal lumbar lordosis. Widespread osseous metastatic disease seen throughout the visualized osseous structures. No pathologic fracture. No significant extra osseous extension of tumor identified. Paraspinous soft tissues demonstrate no acute abnormality. Visualized sacrum intact. L1-2: Mild disc bulge with facet hypertrophy. No canal stenosis. Mild bilateral L1 foraminal stenosis. L2-3: Diffuse disc bulge with moderate facet hypertrophy. Mild canal stenosis. Mild bilateral L2 foraminal narrowing. L3-4: 4 mm anterolisthesis. Diffuse disc bulge with disc desiccation. Advanced facet arthrosis. Moderate to severe canal and bilateral subarticular stenosis. Moderate to severe bilateral L3 foraminal narrowing, left worse than right. L4-5: 2 mm anterolisthesis of L4 on L5. Associated diffuse disc bulge. Advanced facet arthrosis. Patient status post prior decompression at this level. No residual canal stenosis. Foramina appear patent.  L5-S1: L5-S1  interspace largely fused in ankylosed. Prior posterior decompression. Right greater than left facet arthrosis. No canal stenosis. Mild bilateral L5 foraminal narrowing. IMPRESSION: CT CHEST, ABDOMEN, AND PELVIS IMPRESSION: 1. No acute abnormality identified within the chest, abdomen, and pelvis. 2. Diffuse abnormal appearance of the visualized osseous structures, consistent with diffuse osseous metastases. 3. Chronic right renal atrophy with associated 2 mm nonobstructive nephrolithiasis. 4. Mild colonic diverticulosis without evidence for acute diverticulitis. 5. 2.5 cm right adrenal nodule, indeterminate. Follow-up exam with dedicated adrenal mass protocol CT and/or MRI suggested for full characterization. 6. Sequelae of prior mastectomy with right axillary nodal dissection. 7. Tracheostomy tube in place in the upper airway. CT THORACIC AND LUMBAR SPINE IMPRESSION: 1. No acute abnormality within the thoracic and lumbar spine. 2. Diffuse osseous metastatic disease. No associated pathologic fracture. No appreciable extra osseous or epidural tumor identified on this noncontrast examination. 3. Multilevel degenerative spondylolysis as detailed above, greatest within the lower lumbar spine at the L3-4 level. Please see above report for a full description of these findings. Electronically Signed   By: Jeannine Boga M.D.   On: 05/13/2017 05:00   Ct Head Wo Contrast  Addendum Date: 05/22/2017   ADDENDUM REPORT: 05/22/2017 17:54 ADDENDUM: A more recent comparison examination from 05/13/2017 has now been made available. This was not available at the time of original interpretation. The small subdural along the falx cerebrum on the left as well as along the left frontal lobe has resolved in the interval from the prior exam. The previously seen posterior parietal subdural collection on the left has resolved with no significant residual component. There is some questionable prominence of the CSF space in the left  frontal region best seen on image number 16 of series 2 which may represent a more chronic subdural hygroma. This was not well appreciated on the prior exam but has no acute component. No definitive acute hemorrhage is seen. These results were discussed by telephone at the time of interpretation on 05/22/2017 at 5:54 pm to Dr. Larae Grooms , who verbally acknowledged these results. Electronically Signed   By: Inez Catalina M.D.   On: 05/22/2017 17:54   Result Date: 05/22/2017 CLINICAL DATA:  Facial numbness EXAM: CT HEAD WITHOUT CONTRAST TECHNIQUE: Contiguous axial images were obtained from the base of the skull through the vertex without intravenous contrast. COMPARISON:  12/10/2008 FINDINGS: Brain: No evidence of acute infarction, hemorrhage, hydrocephalus, extra-axial collection or mass lesion/mass effect. Cavum septum pellucidum is noted. Vascular: No hyperdense vessel or unexpected calcification. Skull: Normal. Negative for fracture or focal lesion. Sinuses/Orbits: No acute finding. Other: None. IMPRESSION: No acute abnormality noted. Electronically Signed: By: Inez Catalina M.D. On: 05/22/2017 17:29   Ct Head Wo Contrast  Result Date: 05/13/2017 CLINICAL DATA:  66 y/o  F; subdural hematoma. EXAM: CT HEAD WITHOUT CONTRAST TECHNIQUE: Contiguous axial images were obtained from the base of the skull through the vertex without intravenous contrast. COMPARISON:  05/13/2017 CT head FINDINGS: Brain: Stable thin subdural hematoma along the anterior falx. Stable tiny acute subdural hematoma over the left parasagittal frontal convexity. Stable thin chronic subdural hematoma over the left parietal convexity. No significant mass effect on the brain. No evidence for new acute intracranial hemorrhage, acute infarct, mass effect, herniation, or hydrocephalus. Cavum septum pellucidum et vergae. Vascular: No hyperdense vessel. Mild calcific atherosclerosis of carotid siphons. Skull: Normal. Negative for fracture or focal  lesion. Sinuses/Orbits: No acute finding. Other: Right parietal scalp 14 mm dermal lesion, direct visualization  is recommended. Torus palatini. IMPRESSION: 1. Stable thin acute subdural hematoma along the anterior falx and over superior left paramedian frontal lobe. 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 No significant mass effect. 2. No new acute intracranial abnormality. 3. Right high parietal scalp 14 mm dermal lesion. Direct visualization recommended. Electronically Signed   By: Kristine Garbe M.D.   On: 05/13/2017 22:18   Ct Head Wo Contrast  Result Date: 05/13/2017 CLINICAL DATA:  Initial evaluation for acute numbness, tingling, paresthesias. EXAM: CT HEAD WITHOUT CONTRAST TECHNIQUE: Contiguous axial images were obtained from the base of the skull through the vertex without intravenous contrast. COMPARISON:  None available. FINDINGS: Brain: Cerebral volume within normal limits. There is an acute subdural hematoma seen at the anterior inferior falx measuring up to 3 mm in maximal thickness. Additional patchy small volume extra-axial hemorrhage overlies the anterior parasagittal left frontal convexity (series 3, image 23). No significant mass effect. No made of an additional subacute to chronic appearing subdural collection overlying the left parietal convexity measuring up to 5 mm in maximal thickness. No midline shift. No hydrocephalus or ventricular trapping. Basilar cisterns remain patent. No other acute intracranial hemorrhage. No acute large vessel territory infarct. No mass lesion. Vascular: No hyperdense vessel. Scattered vascular calcifications noted within the carotid siphons. Skull: Scalp soft tissues within normal limits.  Calvarium intact. Sinuses/Orbits: Globes and orbital soft tissues within normal limits. Scattered mucosal thickening within the sphenoid sinuses. Paranasal sinuses are otherwise clear. No mastoid effusion. IMPRESSION: 1. Acute small volume  subdural hemorrhage positioned along the anterior inferior falx and overlying the left anterior cerebral convexity. No significant mass effect. 2. Additional subacute to chronic left subdural collection overlying the left parietal convexity without significant mass effect. Critical Value/emergent results were called by telephone at the time of interpretation on 05/13/2017 at 5:09 am to Dr. Ripley Fraise , who verbally acknowledged these results. Electronically Signed   By: Jeannine Boga M.D.   On: 05/13/2017 05:14   Ct Chest Wo Contrast  Result Date: 05/13/2017 CLINICAL DATA:  Initial evaluation for acute right-sided chest pain, extending into right and left sites of face with numbness and tingling in right leg. EXAM: CT CHEST, ABDOMEN AND PELVIS WITHOUT CONTRAST CT OF THE THORACIC AND LUMBAR SPINE WITHOUT CONTRAST. TECHNIQUE: Multidetector CT imaging of the chest, abdomen and pelvis as well as the thoracic and lumbar spine was performed following the standard protocol without IV contrast. COMPARISON:  Prior radiograph from earlier same day. FINDINGS: CT CHEST FINDINGS Cardiovascular: Intrathoracic aorta of normal caliber. Scattered atheromatous plaque within the aortic arch. Partially visualized great vessels within normal limits. Heart size within normal limits. No pericardial effusion. Left-sided pacemaker/ AICD in place. Right-sided Port-A-Cath in place as well. Mediastinum/Nodes: Thyroid normal. No pathologically enlarged mediastinal, hilar, or axillary lymph nodes identified. Sequelae of prior right axillary dissection noted. Esophagus within normal limits. Lungs/Pleura: Tracheostomy tube in place within the upper airway. Tracheobronchial tree patent. Hazy atelectatic changes present dependently within the lower lobes bilaterally. No focal infiltrates. No pulmonary edema or pleural effusion. No pneumothorax. No worrisome pulmonary nodule or mass. Musculoskeletal: Sequelae of prior mastectomy noted.  Scattered lytic and sclerotic changes within the visualized spine, likely related osseous metastases. No acute osseous abnormality. Cervical ACDF noted. CT ABDOMEN PELVIS FINDINGS Hepatobiliary: Limited noncontrast evaluation liver unremarkable. Gallbladder within normal limits. No biliary dilatation. Pancreas: Pancreas within normal limits. Spleen: Spleen within normal limits. Adrenals/Urinary Tract: 2.5 cm intermediate density right adrenal nodule, indeterminate. Adrenal glands otherwise  unremarkable. Right kidneys somewhat atrophic as compared to the left. Punctate 2 mm nonobstructive stone present within the lower pole the right kidney. No other radiopaque calculi. No hydronephrosis or hydroureter. Bladder within normal limits. Stomach/Bowel: Stomach within normal limits. No evidence for bowel obstruction. Mild colonic diverticulosis without evidence for acute diverticulitis. No acute inflammatory changes seen about the bowels. No findings to suggest acute appendicitis. 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 No aneurysm. No adenopathy. Flattening of the IVC, suggesting dehydration/ hypovolemia. Reproductive: Uterus is absent. Left ovary within normal limits. Right ovary not discretely identified. Other: No free air or fluid. Diastasis of the rectus abdominis musculature with small fat containing paraumbilical hernia. Musculoskeletal: Diffuse sclerosis with lucent reasons throughout the visualized osseous structures, likely related to osseous metastases. No acute osseous abnormality. CT THORACIC SPINE: Mild dextroscoliosis. Vertebral bodies otherwise normally aligned with preservation of the normal thoracic kyphosis. No listhesis. Cervical ACDF noted. Diffuse abnormal appearance of the visualized osseous structures, likely related to diffuse osseous metastases. No associated pathologic fracture. No appreciable extraosseous tumor or significant epidural tumor. Paraspinous soft  tissues within normal limits. Multilevel degenerative endplate spurring seen throughout the thoracic spine, greatest within the midthoracic spine. Scattered posterior endplate marginal osteophytic spurring noted as well. No significant canal stenosis. Mild to moderate bilateral foraminal narrowing noted within the lower thoracic spine at T10-11, T11-12, and T12-L1. CT LUMBAR SPINE: Normal segmentation. Lowest well-formed disc labeled the L5-S1 level. 4 mm anterolisthesis of L3 on L4 and 2 mm anterolisthesis of L4 on L5. Vertebral bodies otherwise normally aligned with preservation of the normal lumbar lordosis. Widespread osseous metastatic disease seen throughout the visualized osseous structures. No pathologic fracture. No significant extra osseous extension of tumor identified. Paraspinous soft tissues demonstrate no acute abnormality. Visualized sacrum intact. L1-2: Mild disc bulge with facet hypertrophy. No canal stenosis. Mild bilateral L1 foraminal stenosis. L2-3: Diffuse disc bulge with moderate facet hypertrophy. Mild canal stenosis. Mild bilateral L2 foraminal narrowing. L3-4: 4 mm anterolisthesis. Diffuse disc bulge with disc desiccation. Advanced facet arthrosis. Moderate to severe canal and bilateral subarticular stenosis. Moderate to severe bilateral L3 foraminal narrowing, left worse than right. L4-5: 2 mm anterolisthesis of L4 on L5. Associated diffuse disc bulge. Advanced facet arthrosis. Patient status post prior decompression at this level. No residual canal stenosis. Foramina appear patent. L5-S1: L5-S1 interspace largely fused in ankylosed. Prior posterior decompression. Right greater than left facet arthrosis. No canal stenosis. Mild bilateral L5 foraminal narrowing. IMPRESSION: CT CHEST, ABDOMEN, AND PELVIS IMPRESSION: 1. No acute abnormality identified within the chest, abdomen, and pelvis. 2. Diffuse abnormal appearance of the visualized osseous structures, consistent with diffuse osseous  metastases. 3. Chronic right renal atrophy with associated 2 mm nonobstructive nephrolithiasis. 4. Mild colonic diverticulosis without evidence for acute diverticulitis. 5. 2.5 cm right adrenal nodule, indeterminate. Follow-up exam with dedicated adrenal mass protocol CT and/or MRI suggested for full characterization. 6. Sequelae of prior mastectomy with right axillary nodal dissection. 7. Tracheostomy tube in place in the upper airway. CT THORACIC AND LUMBAR SPINE IMPRESSION: 1. No acute abnormality within the thoracic and lumbar spine. 2. Diffuse osseous metastatic disease. No associated pathologic fracture. No appreciable extra osseous or epidural tumor identified on this noncontrast examination. 3. Multilevel degenerative spondylolysis as detailed above, greatest within the lower lumbar spine at the L3-4 level. Please see above report for a full description of these findings. Electronically Signed   By: Jeannine Boga M.D.   On: 05/13/2017 05:00   Ct T-spine  No Charge  Result Date: 05/13/2017 CLINICAL DATA:  Initial evaluation for acute right-sided chest pain, extending into right and left sites of face with numbness and tingling in right leg. EXAM: CT CHEST, ABDOMEN AND PELVIS WITHOUT CONTRAST CT OF THE THORACIC AND LUMBAR SPINE WITHOUT CONTRAST. TECHNIQUE: Multidetector CT imaging of the chest, abdomen and pelvis as well as the thoracic and lumbar spine was performed following the standard protocol without IV contrast. COMPARISON:  Prior radiograph from earlier same day. FINDINGS: CT CHEST FINDINGS Cardiovascular: Intrathoracic aorta of normal caliber. Scattered atheromatous plaque within the aortic arch. Partially visualized great vessels within normal limits. Heart size within normal limits. No pericardial effusion. Left-sided pacemaker/ AICD in place. Right-sided Port-A-Cath in place as well. Mediastinum/Nodes: Thyroid normal. No pathologically enlarged mediastinal, hilar, or axillary lymph nodes  identified. Sequelae of prior right axillary dissection noted. Esophagus within normal limits. Lungs/Pleura: Tracheostomy tube in place within the upper airway. Tracheobronchial tree patent. Hazy atelectatic changes present dependently within the lower lobes bilaterally. No focal infiltrates. No pulmonary edema or pleural effusion. No pneumothorax. No worrisome pulmonary nodule or mass. Musculoskeletal: Sequelae of prior mastectomy noted. Scattered lytic and sclerotic changes within the visualized spine, likely related osseous metastases. No acute osseous abnormality. Cervical ACDF noted. CT ABDOMEN PELVIS FINDINGS Hepatobiliary: Limited noncontrast evaluation liver unremarkable. Gallbladder within normal limits. No biliary dilatation. Pancreas: Pancreas within normal limits. Spleen: Spleen within normal limits. Adrenals/Urinary Tract: 2.5 cm intermediate density right adrenal nodule, indeterminate. Adrenal glands otherwise unremarkable. Right kidneys somewhat atrophic as compared to the left. Punctate 2 mm nonobstructive stone present within the lower pole the right kidney. No other radiopaque calculi. No hydronephrosis or hydroureter. Bladder within normal limits. Stomach/Bowel: Stomach within normal limits. No evidence for bowel obstruction. Mild colonic diverticulosis without evidence for acute diverticulitis. No acute inflammatory changes seen about the bowels. No findings to suggest acute appendicitis. 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 No aneurysm. No adenopathy. Flattening of the IVC, suggesting dehydration/ hypovolemia. Reproductive: Uterus is absent. Left ovary within normal limits. Right ovary not discretely identified. Other: No free air or fluid. Diastasis of the rectus abdominis musculature with small fat containing paraumbilical hernia. Musculoskeletal: Diffuse sclerosis with lucent reasons throughout the visualized osseous structures, likely related to  osseous metastases. No acute osseous abnormality. CT THORACIC SPINE: Mild dextroscoliosis. Vertebral bodies otherwise normally aligned with preservation of the normal thoracic kyphosis. No listhesis. Cervical ACDF noted. Diffuse abnormal appearance of the visualized osseous structures, likely related to diffuse osseous metastases. No associated pathologic fracture. No appreciable extraosseous tumor or significant epidural tumor. Paraspinous soft tissues within normal limits. Multilevel degenerative endplate spurring seen throughout the thoracic spine, greatest within the midthoracic spine. Scattered posterior endplate marginal osteophytic spurring noted as well. No significant canal stenosis. Mild to moderate bilateral foraminal narrowing noted within the lower thoracic spine at T10-11, T11-12, and T12-L1. CT LUMBAR SPINE: Normal segmentation. Lowest well-formed disc labeled the L5-S1 level. 4 mm anterolisthesis of L3 on L4 and 2 mm anterolisthesis of L4 on L5. Vertebral bodies otherwise normally aligned with preservation of the normal lumbar lordosis. Widespread osseous metastatic disease seen throughout the visualized osseous structures. No pathologic fracture. No significant extra osseous extension of tumor identified. Paraspinous soft tissues demonstrate no acute abnormality. Visualized sacrum intact. L1-2: Mild disc bulge with facet hypertrophy. No canal stenosis. Mild bilateral L1 foraminal stenosis. L2-3: Diffuse disc bulge with moderate facet hypertrophy. Mild canal stenosis. Mild bilateral L2 foraminal narrowing. L3-4: 4 mm anterolisthesis. Diffuse disc  bulge with disc desiccation. Advanced facet arthrosis. Moderate to severe canal and bilateral subarticular stenosis. Moderate to severe bilateral L3 foraminal narrowing, left worse than right. L4-5: 2 mm anterolisthesis of L4 on L5. Associated diffuse disc bulge. Advanced facet arthrosis. Patient status post prior decompression at this level. No residual  canal stenosis. Foramina appear patent. L5-S1: L5-S1 interspace largely fused in ankylosed. Prior posterior decompression. Right greater than left facet arthrosis. No canal stenosis. Mild bilateral L5 foraminal narrowing. IMPRESSION: CT CHEST, ABDOMEN, AND PELVIS IMPRESSION: 1. No acute abnormality identified within the chest, abdomen, and pelvis. 2. Diffuse abnormal appearance of the visualized osseous structures, consistent with diffuse osseous metastases. 3. Chronic right renal atrophy with associated 2 mm nonobstructive nephrolithiasis. 4. Mild colonic diverticulosis without evidence for acute diverticulitis. 5. 2.5 cm right adrenal nodule, indeterminate. Follow-up exam with dedicated adrenal mass protocol CT and/or MRI suggested for full characterization. 6. Sequelae of prior mastectomy with right axillary nodal dissection. 7. Tracheostomy tube in place in the upper airway. CT THORACIC AND LUMBAR SPINE IMPRESSION: 1. No acute abnormality within the thoracic and lumbar spine. 2. Diffuse osseous metastatic disease. No associated pathologic fracture. No appreciable extra osseous or epidural tumor identified on this noncontrast examination. 3. Multilevel degenerative spondylolysis as detailed above, greatest within the lower lumbar spine at the L3-4 level. Please see above report for a full description of these findings. Electronically Signed   By: Jeannine Boga M.D.   On: 05/13/2017 05:00   Ct L-spine No Charge  Result Date: 05/13/2017 CLINICAL DATA:  Initial evaluation for acute right-sided chest pain, extending into right and left sites of face with numbness and tingling in right leg. EXAM: CT CHEST, ABDOMEN AND PELVIS WITHOUT CONTRAST CT OF THE THORACIC AND LUMBAR SPINE WITHOUT CONTRAST. TECHNIQUE: Multidetector CT imaging of the chest, abdomen and pelvis as well as the thoracic and lumbar spine was performed following the standard protocol without IV contrast. COMPARISON:  Prior radiograph from  earlier same day. FINDINGS: CT CHEST FINDINGS Cardiovascular: Intrathoracic aorta of normal caliber. Scattered atheromatous plaque within the aortic arch. Partially visualized great vessels within normal limits. Heart size within normal limits. No pericardial effusion. Left-sided pacemaker/ AICD in place. Right-sided Port-A-Cath in place as well. Mediastinum/Nodes: Thyroid normal. No pathologically enlarged mediastinal, hilar, or axillary lymph nodes identified. Sequelae of prior right axillary dissection noted. Esophagus within normal limits. Lungs/Pleura: Tracheostomy tube in place within the upper airway. Tracheobronchial tree patent. Hazy atelectatic changes present dependently within the lower lobes bilaterally. No focal infiltrates. No pulmonary edema or pleural effusion. No pneumothorax. No worrisome pulmonary nodule or mass. Musculoskeletal: Sequelae of prior mastectomy noted. Scattered lytic and sclerotic changes within the visualized spine, likely related osseous metastases. No acute osseous abnormality. Cervical ACDF noted. CT ABDOMEN PELVIS FINDINGS Hepatobiliary: Limited noncontrast evaluation liver unremarkable. Gallbladder within normal limits. No biliary dilatation. Pancreas: Pancreas within normal limits. Spleen: Spleen within normal limits. Adrenals/Urinary Tract: 2.5 cm intermediate density right adrenal nodule, indeterminate. Adrenal glands otherwise unremarkable. Right kidneys somewhat atrophic as compared to the left. Punctate 2 mm nonobstructive stone present within the lower pole the right kidney. No other radiopaque calculi. No hydronephrosis or hydroureter. Bladder within normal limits. Stomach/Bowel: Stomach within normal limits. No evidence for bowel obstruction. Mild colonic diverticulosis without evidence for acute diverticulitis. No acute inflammatory changes seen about the bowels. No findings to suggest acute appendicitis. Vascular/Lymphatic: Mild to moderate aorto bi-iliac  atherosclerotic disease. No aneurysm. No adenopathy. Flattening of the IVC, suggesting dehydration/ hypovolemia. Reproductive:  Uterus is absent. Left ovary within normal limits. Right ovary not discretely identified. Other: No free air or fluid. Diastasis of the rectus abdominis musculature with small fat containing paraumbilical hernia. Musculoskeletal: Diffuse sclerosis with lucent reasons throughout the visualized osseous structures, likely related to osseous metastases. No acute osseous abnormality. CT THORACIC SPINE: Mild dextroscoliosis. Vertebral bodies otherwise normally aligned with preservation of the normal thoracic kyphosis. No listhesis. Cervical ACDF noted. Diffuse abnormal appearance of the visualized osseous structures, likely related to diffuse osseous metastases. No associated pathologic fracture. No appreciable extraosseous tumor or significant epidural tumor. Paraspinous soft tissues within normal limits. Multilevel degenerative endplate spurring seen throughout the thoracic spine, greatest within the midthoracic spine. Scattered posterior endplate marginal osteophytic spurring noted as well. No significant canal stenosis. Mild to moderate bilateral foraminal narrowing noted within the lower thoracic spine at T10-11, T11-12, and T12-L1. CT LUMBAR SPINE: Normal segmentation. Lowest well-formed disc labeled the L5-S1 level. 4 mm anterolisthesis of L3 on L4 and 2 mm anterolisthesis of L4 on L5. Vertebral bodies otherwise normally aligned with preservation of the normal lumbar lordosis. Widespread osseous metastatic disease seen throughout the visualized osseous structures. No pathologic fracture. No significant extra osseous extension of tumor identified. Paraspinous soft tissues demonstrate no acute abnormality. Visualized sacrum intact. L1-2: Mild disc bulge with facet hypertrophy. No canal stenosis. Mild bilateral L1 foraminal stenosis. L2-3: Diffuse disc bulge with moderate facet hypertrophy.  Mild canal stenosis. Mild bilateral L2 foraminal narrowing. L3-4: 4 mm anterolisthesis. Diffuse disc bulge with disc desiccation. Advanced facet arthrosis. Moderate to severe canal and bilateral subarticular stenosis. Moderate to severe bilateral L3 foraminal narrowing, left worse than right. L4-5: 2 mm anterolisthesis of L4 on L5. Associated diffuse disc bulge. Advanced facet arthrosis. Patient status post prior decompression at this level. No residual canal stenosis. Foramina appear patent. L5-S1: L5-S1 interspace largely fused in ankylosed. Prior posterior decompression. Right greater than left facet arthrosis. No canal stenosis. Mild bilateral L5 foraminal narrowing. IMPRESSION: CT CHEST, ABDOMEN, AND PELVIS IMPRESSION: 1. No acute abnormality identified within the chest, abdomen, and pelvis. 2. Diffuse abnormal appearance of the visualized osseous structures, consistent with diffuse osseous metastases. 3. Chronic right renal atrophy with associated 2 mm nonobstructive nephrolithiasis. 4. Mild colonic diverticulosis without evidence for acute diverticulitis. 5. 2.5 cm right adrenal nodule, indeterminate. Follow-up exam with dedicated adrenal mass protocol CT and/or MRI suggested for full characterization. 6. Sequelae of prior mastectomy with right axillary nodal dissection. 7. Tracheostomy tube in place in the upper airway. CT THORACIC AND LUMBAR SPINE IMPRESSION: 1. No acute abnormality within the thoracic and lumbar spine. 2. Diffuse osseous metastatic disease. No associated pathologic fracture. No appreciable extra osseous or epidural tumor identified on this noncontrast examination. 3. Multilevel degenerative spondylolysis as detailed above, greatest within the lower lumbar spine at the L3-4 level. Please see above report for a full description of these findings. Electronically Signed   By: Jeannine Boga M.D.   On: 05/13/2017 05:00   Dg Chest Port 1 View  Result Date: 05/23/2017 CLINICAL DATA:   Weakness headache and left facial numbness intermittently for the past 3 days. [place] exam. History of CHF, metastatic breast malignancy, coronary artery disease, and CVA. EXAM: PORTABLE CHEST 1 VIEW COMPARISON:  Portable chest x-ray and chest CT scan of May 13, 2017. FINDINGS: The lungs are mildly hypoinflated. The interstitial markings are mildly increased but not greatly changed from the previous study. There is no significant pleural effusion. The cardiac silhouette is enlarged. The central pulmonary  vascularity is prominent. The ICD is in stable position. The tracheostomy tube tip projects at the superior margin of the clavicular heads. The porta catheter tip projects over the proximal SVC. IMPRESSION: Mild hypoinflation. Probable low-grade compensated CHF. No acute pneumonia. Electronically Signed   By: David  Martinique M.D.   On: 05/23/2017 07:15   Dg Chest Portable 1 View  Result Date: 05/13/2017 CLINICAL DATA:  Acute onset of generalized chest pain. Initial encounter. EXAM: PORTABLE CHEST 1 VIEW COMPARISON:  PET/CT performed 01/14/2017, and chest radiograph performed 07/18/2016 FINDINGS: The lungs are well-aerated. Mild bibasilar airspace opacities may reflect atelectasis or possibly mild pneumonia. There is no evidence of pleural effusion or pneumothorax. The cardiomediastinal silhouette is borderline normal in size. A pacemaker is noted overlying the left chest wall, with leads ending at the right atrium and over the right ventricle. No acute osseous abnormalities are seen. The patient's tracheostomy tube is seen ending 7 cm above the carina. Cervical spinal fusion hardware is noted. IMPRESSION: Mild bibasilar airspace opacities may reflect atelectasis or possibly mild pneumonia. Electronically Signed   By: Garald Balding M.D.   On: 05/13/2017 02:13    (Echo, Carotid, EGD, Colonoscopy, ERCP)    Subjective:   Discharge Exam: Vitals:   05/26/17 1111 05/26/17 1130  BP:  122/66  Pulse:  79   Resp:  16  Temp:  98.8 F (37.1 C)  SpO2: 96% 95%   Vitals:   05/26/17 0758 05/26/17 1100 05/26/17 1111 05/26/17 1130  BP:  113/65  122/66  Pulse:  83  79  Resp:  16  16  Temp:  97.9 F (36.6 C)  98.8 F (37.1 C)  TempSrc:  Oral  Oral  SpO2: 91% 96% 96% 95%    General: Pt is alert, awake, not in acute distress Cardiovascular: RRR, S1/S2 +, no rubs, no gallops Respiratory: CTA bilaterally, no wheezing, no rhonchi Abdominal: Soft, NT, ND, bowel sounds + Extremities: no edema, no cyanosis    The results of significant diagnostics from this hospitalization (including imaging, microbiology, ancillary and laboratory) are listed below for reference.     Microbiology: Recent Results (from the past 240 hour(s))  Urine Culture     Status: Abnormal   Collection Time: 05/22/17  5:32 PM  Result Value Ref Range Status   Specimen Description URINE, RANDOM  Final   Special Requests NONE  Final   Culture >=100,000 COLONIES/mL ESCHERICHIA COLI (A)  Final   Report Status 05/24/2017 FINAL  Final   Organism ID, Bacteria ESCHERICHIA COLI (A)  Final      Susceptibility   Escherichia coli - MIC*    AMPICILLIN >=32 RESISTANT Resistant     CEFAZOLIN <=4 SENSITIVE Sensitive     CEFTRIAXONE <=1 SENSITIVE Sensitive     CIPROFLOXACIN <=0.25 SENSITIVE Sensitive     GENTAMICIN <=1 SENSITIVE Sensitive     IMIPENEM <=0.25 SENSITIVE Sensitive     NITROFURANTOIN <=16 SENSITIVE Sensitive     TRIMETH/SULFA >=320 RESISTANT Resistant     AMPICILLIN/SULBACTAM 8 SENSITIVE Sensitive     PIP/TAZO <=4 SENSITIVE Sensitive     Extended ESBL NEGATIVE Sensitive     * >=100,000 COLONIES/mL ESCHERICHIA COLI     Labs: BNP (last 3 results)  Recent Labs  09/25/16 0804 05/24/17 2115  BNP 174.0* 150.5*   Basic Metabolic Panel:  Recent Labs Lab 05/23/17 0420 05/24/17 0330 05/24/17 2000 05/25/17 0619 05/26/17 0432  NA 137 139 140 141 139  K 3.8 4.2 3.9 4.0 3.9  CL 107 105 106 107 107  CO2 _0 GLUCOSE 124* 107* 107* 101* 101*  BUN 23* 28* 27* 28* 27*  CREATININE 1.65* 2.00* 1.79* 1.88* 1.86*  CALCIUM 10.2 10.3 10.9* 10.8* 10.7*   Liver Function Tests:  Recent Labs Lab 05/22/17 1732 05/24/17 2000 05/25/17 0619  AST 257* 234* 230*  ALT 78* 67* 65*  ALKPHOS 146* 145* 151*  BILITOT 1.8* 1.2 1.2  PROT 7.4 7.6 7.5  ALBUMIN 3.4* 3.4* 3.4*   No results for input(s): LIPASE, AMYLASE in the last 168 hours. No results for input(s): AMMONIA in the last 168 hours. CBC:  Recent Labs Lab 05/22/17 1732 05/23/17 0420 05/23/17 2124 05/24/17 0330 05/24/17 1919 05/25/17 0619 05/26/17 0432  WBC 6.1 6.7  --  8.9 6.2 6.6 5.8  NEUTROABS 2.6  --   --   --  2.2  --   --   HGB 8.7* 8.0*  --  8.4* 8.5* 8.6* 7.8*  HCT 26.0* 23.5*  --  24.9* 25.6* 26.4* 23.6*  MCV 89.5 88.6  --  89.6 90.8 91.7 91.5  PLT 17* 13* 19* 15* 16* 13* 9*   Cardiac Enzymes:  Recent Labs Lab 05/22/17 1732 05/22/17 2016 05/23/17 0019 05/23/17 0420 05/23/17 0837  TROPONINI 0.03* 0.03* <0.03 <0.03 <0.03   BNP: Invalid input(s): POCBNP CBG:  Recent Labs Lab 05/23/17 0750 05/24/17 0737 05/25/17 0801 05/26/17 0813  GLUCAP 119* 96 106* 95   D-Dimer No results for input(s): DDIMER in the last 72 hours. Hgb A1c No results for input(s): HGBA1C in the last 72 hours. Lipid Profile No results for input(s): CHOL, HDL, LDLCALC, TRIG, CHOLHDL, LDLDIRECT in the last 72 hours. Thyroid function studies No results for input(s): TSH, T4TOTAL, T3FREE, THYROIDAB in the last 72 hours.  Invalid input(s): FREET3 Anemia work up  Recent Labs  05/23/17 1602 05/24/17 1919  VITAMINB12 1,022*  --   RETICCTPCT  --  3.5*   Urinalysis    Component Value Date/Time   COLORURINE YELLOW 05/25/2017 0648   APPEARANCEUR CLEAR 05/25/2017 0648   LABSPEC 1.016 05/25/2017 0648   PHURINE 5.0 05/25/2017 0648   GLUCOSEU NEGATIVE 05/25/2017 0648   GLUCOSEU NEGATIVE 11/30/2013 1455   HGBUR SMALL (A) 05/25/2017 0648    BILIRUBINUR NEGATIVE 05/25/2017 0648   BILIRUBINUR neg 11/25/2013 1528   KETONESUR NEGATIVE 05/25/2017 0648   PROTEINUR 30 (A) 05/25/2017 0648   UROBILINOGEN 0.2 11/30/2013 1455   NITRITE NEGATIVE 05/25/2017 0648   LEUKOCYTESUR TRACE (A) 05/25/2017 0648   Sepsis Labs Invalid input(s): PROCALCITONIN,  WBC,  LACTICIDVEN Microbiology Recent Results (from the past 240 hour(s))  Urine Culture     Status: Abnormal   Collection Time: 05/22/17  5:32 PM  Result Value Ref Range Status   Specimen Description URINE, RANDOM  Final   Special Requests NONE  Final   Culture >=100,000 COLONIES/mL ESCHERICHIA COLI (A)  Final   Report Status 05/24/2017 FINAL  Final   Organism ID, Bacteria ESCHERICHIA COLI (A)  Final      Susceptibility   Escherichia coli - MIC*    AMPICILLIN >=32 RESISTANT Resistant     CEFAZOLIN <=4 SENSITIVE Sensitive     CEFTRIAXONE <=1 SENSITIVE Sensitive     CIPROFLOXACIN <=0.25 SENSITIVE Sensitive     GENTAMICIN <=1 SENSITIVE Sensitive     IMIPENEM <=0.25 SENSITIVE Sensitive     NITROFURANTOIN <=16 SENSITIVE Sensitive     TRIMETH/SULFA >=320 RESISTANT Resistant     AMPICILLIN/SULBACTAM 8 SENSITIVE  Sensitive     PIP/TAZO <=4 SENSITIVE Sensitive     Extended ESBL NEGATIVE Sensitive     * >=100,000 COLONIES/mL ESCHERICHIA COLI     Time coordinating discharge: Over 30 minutes  SIGNED:   Elmarie Shiley, MD  [facility] 05/26/2017, 12:12 PM Pager   If 7PM-7AM, please contact night-coverage [email or web address] Password [email or web address]

## 2017-05-26 NOTE — Progress Notes (Signed)
PT Cancellation Note  Patient Details Name: [name] MRN: [id number] DOB: 01/18/1951   Cancelled Treatment:    Reason Eval/Treat Not Completed: Medical issues which prohibited therapy (platelets are  low. To get platelets today. Will check back another time.(  per clinical guidelines, )   Claretha Cooper 05/26/2017, 8:27 AM Tresa Endo PT [phone]

## 2017-05-26 NOTE — Progress Notes (Signed)
Patient discharged to home, all discharge medications and instructions reviewed and questions answered.  Patient to be assisted to vehicle by wheelchair.  

## 2017-05-26 NOTE — Care Management Note (Addendum)
Case Management Note  Patient Details  Name: [name] MRN: [id number] Date of Birth: 07/06/1951  Subjective/Objective: 66 y.o. F to be discharged with resumption of [facility] and aide with [place]. CM notified Jerm[facility]. Spoke with pt who is satisfied with [facility] and does not want to change. After further investigation realized [facility] agency has been changed to [facility]. Made pt aware that this has been changed 2/2 RN availability. Pt not as satisfied but aware of these types of situations.                  Action/Plan:CM will sign off for now but will be available should additional discharge needs arise or disposition change.    Expected Discharge Date:  05/26/17               Expected Discharge Plan:     In-House Referral:     Discharge planning Services  CM Consult  Post Acute Care Choice:  Home Health Choice offered to:  Patient  DME Arranged:  N/A DME Agency:     HH Arranged:  RN, Nurse's Aide [place] Agency:  [facility]  Status of Service:  Completed, signed off  If discussed at [place] of Stay Meetings, dates discussed:    Additional Comments:  Delrae Sawyers, RN 05/26/2017, 1:47 PM

## 2017-05-26 NOTE — Progress Notes (Signed)
I checked in briefly on [name].   She reports waiting for platelets to come up for transfusion.  Denies other needs.  Micheline Rough, MD [place] [facility] Team [phone]  NO CHARGE NOTE

## 2017-05-26 NOTE — Progress Notes (Signed)
[facility] Follow up:    Arnetha Courser, MD [address] [place] [id number]   DIAGNOSIS: Cancer Staging Breast cancer of upper-inner quadrant of right female breast [facility]) Staging form: Breast, AJCC 7th Edition - Clinical: Stage IV (M1) - Unsigned  Malignant neoplasm of upper-outer quadrant of right breast in female, estrogen receptor positive ([place]) Staging form: Breast, AJCC 8th Edition - Clinical: No stage assigned - Unsigned   SUMMARY OF ONCOLOGIC HISTORY:   Breast cancer of upper-inner quadrant of right female breast ([place])   06/15/2016 Mammogram    Right breast irregular mass UIQ with diffuse skin thickening that is palpable, additional pleomorphic calcifications 2.8 x 2.1 x 3.5 cm extending posteriorly for another 2 cm; left breast 8 mm circumscribed nodule       06/21/2016 Initial Diagnosis    Right breast biopsy UIQ: IDC grade 2 with papillary features ER 100%, PR 100%, Ki-67 15%, HER-2 negative ratio 1.21; right axillary LN biopsy metastatic carcinoma in one lymph node with ECE, ER 45%, PR 0%, Ki-67 20%, HER-2 negative ratio 1.86      06/28/2016 Procedure    Left breast biopsy: Fibroadenoma      07/16/2016 PET scan    Right breast primary inflammatory breast cancer, right axillary lymph node metastases, T7 bone metastases, indeterminate focus in the left medial breast 7 mm, right adrenal nodule      07/19/2016 - 09/20/2016 Neo-Adjuvant Chemotherapy    Neoadjuvant Taxotere and Cytoxan every 3 weeks 4 cycles      12/13/2016 Surgery    Right mastectomy: Multifocal tumor largest 2.3 cm, remaining 6 foci 0.3-1.2 centimeters, IDC with papillary features and smaller foci of IDC with lobular features grade 2, 19/20 lymph nodes positive, ER 100%, PR 100%, HER-2 negative, Ki-67 15%; repeat testing on the final path EF 30%, PR 0%, HER-2 negative ratio 1.67, Ki-67 20% RCB class 3 T2N3a Stage 3C      01/14/2017 PET scan    Multifocal  hypermetabolic bone metastases within the T7 vertebra SUV 10.7. New uptake within L5 SUV 11. Previously known uptake at right inferior pubic ramus SUV 8.2.      01/21/2017 - 05/2017 Anti-estrogen oral therapy    Palbociclib with Letrozole, Palbociclib stopped in 04/2017, Letrozole stopped in 05/2017       Malignant neoplasm of upper-outer quadrant of right breast in female, estrogen receptor positive ([place])    CURRENT THERAPY: Letrozole (transitioning to hospice)  INTERVAL HISTORY: ANWYN KRIEGEL 66 y.o. female returns for evaluation of her metastatic breast cancer.  She was recently hospitalized, and was told that she does not have much longer to live.  She is here to discuss if there is any possibility of further treatment, or if hospice is the best choice at this time.    Patient Active Problem List   Diagnosis Date Noted  . UTI (urinary tract infection) 05/25/2017  . GERD (gastroesophageal reflux disease) 05/24/2017  . Facial numbness 05/22/2017  . Acute subdural hematoma ([place]) 05/13/2017  . Thrombocytopenia ([place]) 05/13/2017  . Bilateral leg numbness 05/13/2017  . Malignant neoplasm of upper-outer quadrant of right breast in female, estrogen receptor positive ([place]) 01/21/2017  . Breast cancer ([place]) 12/13/2016  . Decreased breath sounds at left lung base 11/13/2016  . Anemia due to antineoplastic chemotherapy 09/25/2016  . Anemia 09/25/2016  . Port catheter in place 08/31/2016  . Breast cancer of upper-inner quadrant of right female breast ([place]) 07/05/2016  . Hypokalemia  05/30/2016  . Mass of breast, right 05/30/2016  . Proptosis 05/30/2016  . Acute renal failure superimposed on stage 3 chronic kidney disease ([place]) 08/11/2015  . Cramp in muscle 07/11/2015  . H/O injury, presenting hazards to health 07/11/2015  . Esophagitis, reflux 07/11/2015  . Chronic pain due to injury 07/11/2015  . Chronic hypokalemia 07/11/2015  . Atrial fibrillation, chronic ([place]) 07/11/2015  . CAD in  native artery 07/11/2015  . Erythema nodosum, acute form 07/11/2015  . Pain in joint of left wrist 07/11/2015  . Hypertension goal BP (blood pressure) < 140/90 01/04/2014  . Glaucoma 01/04/2014  . 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 Inflammation of uveal tract 01/04/2014  . 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 Tracheostomy status ([place]) 09/23/2012  . Aneurysm, cerebral 08/13/2012  . Cerebral vascular accident ([place]) 08/13/2012  . 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 Encounter for monitoring coumadin therapy 06/11/2012    is allergic to lisinopril and eggs or egg-derived products.  MEDICAL HISTORY: Past Medical History:  Diagnosis Date  . 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 Anticoagulated on Coumadin   . Arthritis   . Atherosclerosis of coronary artery of native heart without angina pectoris   . Atrial fibrillation, chronic ([place])   . 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 Breast cancer ([place]) 06/21/2016   Right breast  . CHF (congestive heart failure) ([place])   . Chronic hypokalemia   . Chronic kidney disease    stage III  . Chronic pain due to trauma   . Difficult intubation    permanent tracheostomy;lungs filled with water when extubated  . Frequent headaches   . GERD (gastroesophageal reflux disease)   . History of stomach ulcers   . Hypertension   . Hypertension, goal below 140/90   . Myocardial infarction ([place])   . PONV (postoperative nausea and vomiting)   . 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 Spinal headache   . Stroke [facility])    ?per patient; history transverse venous sinus thrombosis 2005 (evaluated at [facility]; lifelong anticoagulation recommended)  . Trachea, ring   . Tracheostomy in place [facility])   . Urinary tract infection, recurrent   . Urine incontinence     SURGICAL HISTORY: Past Surgical History:  Procedure Laterality Date  . ABDOMINAL HYSTERECTOMY    . BACK SURGERY    . BLADDER SURGERY     hitch  .  CHOLECYSTECTOMY    . FRACTURE SURGERY     B/L feet and right ankle  . GANGLION CYST EXCISION  2011  . MASTECTOMY MODIFIED RADICAL Bilateral 12/13/2016   Procedure: RIGHT MODIFIED RADICAL MASTECTOMY, LEFT SIMPLE MASTECTOMY;  Surgeon: Coralie Keens, MD;  Location: [facility];  Service: General;  Laterality: Bilateral;  . PACEMAKER INSERTION  10/29/2005  . PACEMAKER PLACEMENT  08/22/2012  . PORT A CATH INJECTION ([place] [facility])  07/18/2016  . PORTACATH PLACEMENT Right 07/18/2016   Procedure: INSERTION PORT-A-CATH;  Surgeon: Coralie Keens, MD;  Location: [facility];  Service: General;  Laterality: Right;  . TRACHEAL SURGERY  2000  . UVULOPALATOPHARYNGOPLASTY      SOCIAL HISTORY: Social History   Social History  . Marital status: Single    Spouse name: N/A  . Number of children: N/A  . Years of education: N/A   Occupational History  . disabled    Social History Main Topics  . Smoking status: Current Some Day Smoker    Years: 40.00  Types: Cigarettes  . Smokeless tobacco: Never Used     Comment: 3 a week.  pt can't remember when she had one last  . Alcohol use No  . Drug use: No  . Sexual activity: Yes   Other Topics Concern  . Not on file   Social History Narrative  . No narrative on file    FAMILY HISTORY: Family History  Problem Relation Age of Onset  . Heart disease Mother   . Hypertension Mother   . Anemia Mother   . Heart disease Father   . Hypertension Father   . Anemia Father   . Cancer Unknown   . Heart disease Sister   . Heart disease Brother   . Mental illness Brother     Review of Systems  Constitutional: Positive for fatigue. Negative for appetite change and chills.      PHYSICAL EXAMINATION  ECOG PERFORMANCE STATUS: 3 - Symptomatic, >50% confined to bed  Vitals:   05/27/17 1423  BP: 117/65  Pulse: 82  Resp: 18  Temp: 98.7 F (37.1 C)  SpO2: 100%    Physical Exam  Constitutional: She is oriented to person, place, and time and well-developed,  well-nourished, and in no distress.  HENT:  Head: Normocephalic and atraumatic.  Eyes: Pupils are equal, round, and reactive to light.  Neck: Neck supple.  Neurological: She is alert and oriented to person, place, and time.  Psychiatric:  Tearful throughout the conversation    LABORATORY DATA:  CBC    Component Value Date/Time   WBC 7.2 05/27/2017 1358   WBC 5.8 05/26/2017 0432   RBC 2.81 (L) 05/27/2017 1358   RBC 2.58 (L) 05/26/2017 0432   HGB 8.2 (L) 05/27/2017 1358   HCT 26.0 (L) 05/27/2017 1358   PLT 13 (L) 05/27/2017 1358   PLT 259 07/22/2015 1437   MCV 92.5 05/27/2017 1358   MCH 29.2 05/27/2017 1358   MCH 30.2 05/26/2017 0432   MCHC 31.5 05/27/2017 1358   MCHC 33.1 05/26/2017 0432   RDW 18.4 (H) 05/27/2017 1358   LYMPHSABS 2.2 05/24/2017 1919   LYMPHSABS 2.7 05/07/2017 1412   MONOABS 1.2 (H) 05/24/2017 1919   MONOABS 1.1 (H) 05/07/2017 1412   EOSABS 0.1 05/24/2017 1919   EOSABS 0.0 05/07/2017 1412   EOSABS 0.1 07/22/2015 1437   BASOSABS 0.5 (H) 05/24/2017 1919   BASOSABS 0.4 (H) 05/07/2017 1412    CMP     Component Value Date/Time   NA 139 05/27/2017 1358   K 3.9 05/27/2017 1358   CL 107 05/26/2017 0432   CO2 23 05/27/2017 1358   GLUCOSE 98 05/27/2017 1358   BUN 25.9 05/27/2017 1358   CREATININE 2.0 (H) 05/27/2017 1358   CALCIUM 11.7 (H) 05/27/2017 1358   PROT 7.5 05/27/2017 1358   ALBUMIN 3.2 (L) 05/27/2017 1358   AST 292 (HH) 05/27/2017 1358   ALT 87 (H) 05/27/2017 1358   ALKPHOS 188 (H) 05/27/2017 1358   BILITOT 1.25 (H) 05/27/2017 1358   GFRNONAA 28 (L) 05/26/2017 0432   GFRNONAA 37 (L) 05/30/2016 1659   GFRAA 32 (L) 05/26/2017 0432   GFRAA 43 (L) 05/30/2016 1659         ASSESSMENT and THERAPY PLAN:   Breast cancer of upper-inner quadrant of right female breast ([place]) Right mastectomy 12/13/2016: Multifocal tumor largest 2.3 cm, remaining 6 foci 0.3-1.2 centimeters, IDC with papillary features and smaller foci of IDC with lobular  features grade 2, 19/20 lymph nodes positive, ER 100%,  PR 100%, HER-2 negative, Ki-67 15%; repeat testing on the final path EF 30%, PR 0%, HER-2 negative ratio 1.67, Ki-67 20% RCB class 3 T2N3a Stage 3C  07/16/2016: PET/CT scan: Right breast inflammatory breast cancer, T7 osseous metastases, indeterminate focus medial left breast corresponding to 7 mm calcified soft tissue nodule, right adrenal adenoma  Clinical stage: T4 N1M1stage IV  Treatment summary: 1. Neoadjuvant chemotherapy with TaxotereCytoxan 4 (because patient has history of cardiac problems and has a pacemaker); 07/19/2016 to 09/23/2016 2. Rt mastectomy with axillary node dissection 12/13/2016 3. Consideration foradjuvant radiation including radiation to T7 vertebral body: Patient refused  ----------------------------------------------------------------------------------------------------------------------------------------- Goal of treatment: Palliation and control of disease Treatment plan: 1. Patient was not keen on receiving adjuvant radiation. 2. PET CT scan: 3 bone metastases identified. T7, L5, right inferior pubic ramus 3. Current treatment: letrozole 2.5 mg daily with Palbociclib.Started 01/21/2017 (stopped in 04/2017) 4. Hospice due to progression of bone disease and disease in her bone marrow ---------------------------------------------------------------------- Hoyle Sauer has incurable metastatic breast cancer to her bone and bone marrow.  She cannot tolerate any further treatment, and certainly not chemotherapy.  After a lengthy discussion with Dr. Lindi Adie she will transition under the care of hospice.  She will no longer receive transfusions.  We will refer to hospice and have them come out to see her in her home.  She will take only Gabapentin, Amlodipine, and Keppra.  Dr. Lindi Adie stated he would work with hospice to wean her off of the [place].  Patient is tearful, but is in agreement with the plan.      All  questions were answered. The patient knows to call the clinic with any problems, questions or concerns. We can certainly see the patient much sooner if necessary.  A total of (30) minutes of face-to-face time was spent with this patient with greater than 50% of that time in counseling and care-coordination.  This note was electronically signed. Scot Dock, NP 05/27/2017   Attending Note  I personally saw and examined Louann Sjogren. The plan of care was discussed with her. I agree with the assessment and plan as documented above. I spent over 30 minutes discussing with the patient that because of her incurable disease and her inability to tolerate systemic therapy with her disc chemotherapy previously are the current therapy with Palbociclib, we do not have any options and that I would recommend hospice care. Patient was extremely tearful but fully understanding and is agreeable with this decision. She is a DO NOT RESUSCITATE CC Patient does not have much help at home. Hopefully hospice will be able to provide her services so that she can remain at home. I discussed discontinuing several of her medications. We may have to wean off and discontinue Keppra.  Signed Rulon Eisenmenger, MD

## 2017-05-26 NOTE — Progress Notes (Signed)
IP PROGRESS NOTE  Subjective:   No bleeding, no complaint. She met with palliative care yesterday. Objective: Vital signs in last 24 hours: Blood pressure 115/61, pulse 78, temperature 98.4 F (36.9 C), temperature source Oral, resp. rate 16, SpO2 93 %.  Intake/Output from previous day: 08/18 0701 - 08/19 0700 In: 720 [P.O.:720] Out: -   Physical Exam:  HEENT: No thrush or bleeding  Extremities: No leg edema Skin: Ecchymoses at the left upper medial arm, no petechiae Neurologic: Alert and oriented, follows commands, begins both knees and moves feet.    Portacath/PICC-without erythema  Lab Results:  Recent Labs  05/25/17 0619 05/26/17 0432  WBC 6.6 5.8  HGB 8.6* 7.8*  HCT 26.4* 23.6*  PLT 13* 9*   Reticulocyte count-98.7 on 05/24/2017 BMET  Recent Labs  05/25/17 0619 05/26/17 0432  NA 141 139  K 4.0 3.9  CL 107 107  CO2 22 23  GLUCOSE 101* 101*  BUN 28* 27*  CREATININE 1.88* 1.86*  CALCIUM 10.8* 10.7*   ADAMS13 activity on 05/23/2017-greater than 100  Calcium 10.8, albumin 3.4 , bilirubin 1.2   Medications: I have reviewed the patient's current medications.  Assessment/Plan:  1. 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 Severe anemia/thrombocytopenia 3. Chronic renal failure 4. Subdural hemorrhage 05/13/2017 after presenting with lower extremity weakness 5. History of neutropenia while on palbociclib, July 2018-resolved 6. Behcet's syndrome 7. History of congestive heart failure 8. Status post tracheostomy 9. History of atrial fibrillation 10. Mild hypercalcemia of malignancy-asymptomatic  Ms. Okun appears unchanged. She discussed goals of care with the palliative care team yesterday. She is now on a no CODE BLUE status.  She would like to discuss options of Hospice care versus continuing treatment of the breast cancer with  Dr.Gudena.  We will transfuse platelets today.    Recommendations: 1. Transfuse platelets today 2. Consider diagnostic bone marrow biopsy 05/27/2017 3. Discussion with Dr. Gudena 05/27/2017 regarding Hospice care versus a trial of salvage systemic therapy    LOS: 2 days    Sherill, MD   05/26/2017, 7:07 AM  Blood Smear 8/17:platelets are markedly decreased, no clumps. Increased polychromasia. Mature Nrbcs and more immature nucleated rbc precursors.Teardrops, few helmets, rare schistocyte. Bands, few myelocytes.  Appearance consistent with leukoerythroblastic smear. 

## 2017-05-26 NOTE — Progress Notes (Signed)
Per Dr Benay Spice unmatched platelets can be transferred to patient and not wait until Monday for matched unit. Will continue to monitor.

## 2017-05-26 NOTE — Assessment & Plan Note (Addendum)
Right mastectomy 12/13/2016: Multifocal tumor largest 2.3 cm, remaining 6 foci 0.3-1.2 centimeters, IDC with papillary features and smaller foci of IDC with lobular features grade 2, 19/20 lymph nodes positive, ER 100%, PR 100%, HER-2 negative, Ki-67 15%; repeat testing on the final path EF 30%, PR 0%, HER-2 negative ratio 1.67, Ki-67 20% RCB class 3 T2N3a Stage 3C  07/16/2016: PET/CT scan: Right breast inflammatory breast cancer, T7 osseous metastases, indeterminate focus medial left breast corresponding to 7 mm calcified soft tissue nodule, right adrenal adenoma  Clinical stage: T4 N1M1stage IV  Treatment summary: 1. Neoadjuvant chemotherapy with TaxotereCytoxan 4 (because patient has history of cardiac problems and has a pacemaker); 07/19/2016 to 09/23/2016 2. Rt mastectomy with axillary node dissection 12/13/2016 3. Consideration foradjuvant radiation including radiation to T7 vertebral body: Patient refused  ----------------------------------------------------------------------------------------------------------------------------------------- Goal of treatment: Palliation and control of disease Treatment plan: 1. Patient was not keen on receiving adjuvant radiation. 2. PET CT scan: 3 bone metastases identified. T7, L5, right inferior pubic ramus 3. Current treatment: letrozole 2.5 mg daily with Palbociclib.Started 01/21/2017 (stopped in 04/2017) 4. Hospice due to progression of bone disease and disease in her bone marrow ---------------------------------------------------------------------- [name] has incurable metastatic breast cancer to her bone and bone marrow.  She cannot tolerate any further treatment, and certainly not chemotherapy.  After a lengthy discussion with Dr. Lindi Adie she will transition under the care of hospice.  She will no longer receive transfusions.  We will refer to hospice and have them come out to see her in her home.  She will take only Gabapentin,  Amlodipine, and Keppra.  Dr. Lindi Adie stated he would work with hospice to wean her off of the [place].  Patient is tearful, but is in agreement with the plan.

## 2017-05-27 ENCOUNTER — Other Ambulatory Visit: Payer: Self-pay | Admitting: Family Medicine

## 2017-05-27 ENCOUNTER — Other Ambulatory Visit (HOSPITAL_BASED_OUTPATIENT_CLINIC_OR_DEPARTMENT_OTHER): Payer: PPO

## 2017-05-27 ENCOUNTER — Ambulatory Visit (HOSPITAL_BASED_OUTPATIENT_CLINIC_OR_DEPARTMENT_OTHER): Payer: PPO | Admitting: Adult Health

## 2017-05-27 VITALS — BP 117/65 | HR 82 | Temp 98.7°F | Resp 18 | Ht 68.0 in

## 2017-05-27 DIAGNOSIS — C50211 Malignant neoplasm of upper-inner quadrant of right female breast: Secondary | ICD-10-CM

## 2017-05-27 DIAGNOSIS — C50919 Malignant neoplasm of unspecified site of unspecified female breast: Secondary | ICD-10-CM | POA: Diagnosis not present

## 2017-05-27 DIAGNOSIS — C773 Secondary and unspecified malignant neoplasm of axilla and upper limb lymph nodes: Secondary | ICD-10-CM | POA: Diagnosis not present

## 2017-05-27 DIAGNOSIS — C7951 Secondary malignant neoplasm of bone: Secondary | ICD-10-CM

## 2017-05-27 DIAGNOSIS — Z7901 Long term (current) use of anticoagulants: Secondary | ICD-10-CM

## 2017-05-27 DIAGNOSIS — Z170 Estrogen receptor positive status [ER+]: Secondary | ICD-10-CM

## 2017-05-27 LAB — COMPREHENSIVE METABOLIC PANEL
ALBUMIN: 3.2 g/dL — AB (ref 3.5–5.0)
ALK PHOS: 188 U/L — AB (ref 40–150)
ALT: 87 U/L — ABNORMAL HIGH (ref 0–55)
ANION GAP: 10 meq/L (ref 3–11)
AST: 292 U/L (ref 5–34)
BILIRUBIN TOTAL: 1.25 mg/dL — AB (ref 0.20–1.20)
BUN: 25.9 mg/dL (ref 7.0–26.0)
CO2: 23 meq/L (ref 22–29)
Calcium: 11.7 mg/dL — ABNORMAL HIGH (ref 8.4–10.4)
Chloride: 107 mEq/L (ref 98–109)
Creatinine: 2 mg/dL — ABNORMAL HIGH (ref 0.6–1.1)
EGFR: 31 mL/min/{1.73_m2} — AB (ref >90)
Glucose: 98 mg/dl (ref 70–140)
Potassium: 3.9 mEq/L (ref 3.5–5.1)
Sodium: 139 mEq/L (ref 136–145)
TOTAL PROTEIN: 7.5 g/dL (ref 6.4–8.3)

## 2017-05-27 LAB — PREPARE PLATELET PHERESIS: UNIT DIVISION: 0

## 2017-05-27 LAB — CBC WITH DIFFERENTIAL/PLATELET
HCT: 26 % — ABNORMAL LOW (ref 34.8–46.6)
HEMOGLOBIN: 8.2 g/dL — AB (ref 11.6–15.9)
MCH: 29.2 pg (ref 25.1–34.0)
MCHC: 31.5 g/dL (ref 31.5–36.0)
MCV: 92.5 fL (ref 79.5–101.0)
Platelets: 13 10*3/uL — ABNORMAL LOW (ref 145–400)
RBC: 2.81 10*6/uL — ABNORMAL LOW (ref 3.70–5.45)
RDW: 18.4 % — AB (ref 11.2–14.5)
WBC: 7.2 10*3/uL (ref 3.9–10.3)

## 2017-05-27 LAB — MANUAL DIFFERENTIAL
ALC: 3.4 10*3/uL — ABNORMAL HIGH (ref 0.9–3.3)
ANC (CHCC manual diff): 3 10*3/uL (ref 1.5–6.5)
Band Neutrophils: 11 % — ABNORMAL HIGH (ref 0–10)
Basophil: 0 % (ref 0–2)
Blasts: 0 % (ref 0–0)
EOS%: 3 % (ref 0–7)
LYMPH: 48 % (ref 14–49)
METAMYELOCYTES PCT: 5 % — AB (ref 0–0)
MONO: 7 % (ref 0–14)
Myelocytes: 6 % — ABNORMAL HIGH (ref 0–0)
NRBC: 56 % — AB (ref 0–0)
Other Cell: 0 % (ref 0–0)
PLT EST: DECREASED
PROMYELO: 0 % (ref 0–0)
SEG: 20 % — ABNORMAL LOW (ref 38–77)
Variant Lymph: 0 % (ref 0–0)
White Cell Comments: 1

## 2017-05-27 LAB — BPAM PLATELET PHERESIS
BLOOD PRODUCT EXPIRATION DATE: 201808192359
ISSUE DATE / TIME: 201808191057
UNIT TYPE AND RH: 6200

## 2017-05-27 LAB — PROTIME-INR
INR: 1.3 — AB (ref 2.00–3.50)
Protime: 15.6 Seconds — ABNORMAL HIGH (ref 10.6–13.4)

## 2017-05-27 LAB — PATHOLOGIST SMEAR REVIEW

## 2017-05-28 ENCOUNTER — Encounter: Payer: Self-pay | Admitting: Adult Health

## 2017-05-31 ENCOUNTER — Telehealth: Payer: Self-pay

## 2017-05-31 ENCOUNTER — Other Ambulatory Visit: Payer: Self-pay

## 2017-05-31 DIAGNOSIS — C50211 Malignant neoplasm of upper-inner quadrant of right female breast: Secondary | ICD-10-CM

## 2017-05-31 NOTE — Telephone Encounter (Signed)
[name] with hospice of [facility] called asking if pt needs to continue weekly INR checks. She is no longer taking warfarin. Also if she needs to continue port flushes.   Instructed [name] to stop INR checks. Port flushes is q 6-8 weeks to keep access open in case of needing IV morphine.

## 2017-05-31 NOTE — Telephone Encounter (Signed)
Thank you for addressing this.

## 2017-06-03 ENCOUNTER — Telehealth: Payer: Self-pay

## 2017-06-03 NOTE — Telephone Encounter (Signed)
PT requesting a new RX for pantoprazole tab 40 mg

## 2017-06-03 NOTE — Telephone Encounter (Signed)
I tried to call to speak with patient; woman answered the phone; said patient was there, but I couldn't talk with her; they had someone there I asked if she could please just relay a message, that Dr. Sanda Klein called and is thinking about her ---------------------------------------- I reviewed the discharge summary, as the protonix was removed from the medicine list See reason: GERD: -stop protonix due to thrombocytopenia.  ------------------------------------------ I'll have to deny the protonix request I would suggest she contact her hematologist-oncologist to see if she can take Zantac instead, as it doesn't look like they put her on anything in its place Thank you

## 2017-06-12 ENCOUNTER — Other Ambulatory Visit: Payer: Self-pay

## 2017-06-12 ENCOUNTER — Telehealth: Payer: Self-pay

## 2017-06-12 MED ORDER — OXYCODONE HCL 5 MG PO TABS: 5.0000 mg | ORAL_TABLET | ORAL | 0 refills | Status: AC | PRN

## 2017-06-12 MED ORDER — LORAZEPAM 0.5 MG PO TABS: 0.5000 mg | ORAL_TABLET | ORAL | 0 refills | Status: AC | PRN

## 2017-06-12 NOTE — Telephone Encounter (Signed)
Received call from Dominica, hospice and palliative care nurse requesting pain medication refill for pt. Pt taking oxycodone IR 5mg  q4h and lorazepam 0.5mg  q4h. [facility] [place]. Script signed by Dr.Gudena, sent and faxed confirmed.

## 2017-06-20 ENCOUNTER — Telehealth: Payer: Self-pay | Admitting: *Deleted

## 2017-06-20 ENCOUNTER — Other Ambulatory Visit: Payer: Self-pay | Admitting: Family Medicine

## 2017-06-20 NOTE — Telephone Encounter (Signed)
Received notification from pt poa- pt passes at 4:36am on 9/12.

## 2017-07-08 DEATH — deceased

## 2017-09-06 IMAGING — CT CT HEAD W/O CM
3 series · 14 of 47 positions shown, 16 images · non-contrast
Comparison: 12/10/2008

ADDENDUM:
A more recent comparison examination from 05/13/2017 has now been
made available. This was not available at the time of original
interpretation. The small subdural along the falx cerebrum on the
left as well as along the left frontal lobe has resolved in the
interval from the prior exam. The previously seen posterior parietal
subdural collection on the left has resolved with no significant
residual component. There is some questionable prominence of the CSF
space in the left frontal region best seen on image number 16 of
series 2 which may represent a more chronic subdural hygroma. This
was not well appreciated on the prior exam but has no acute
component. No definitive acute hemorrhage is seen.

These results were discussed by telephone at the time of
interpretation on 05/22/2017 at [DATE] to Dr. KANG YOSEP GOAN ENG , who
verbally acknowledged these results.
CLINICAL DATA: Facial numbness
EXAM:
CT HEAD WITHOUT CONTRAST
TECHNIQUE: Contiguous axial images were obtained from the base of the skull
through the vertex without intravenous contrast.

[Series 2: head wo · axial · 0.46mm/px · z∈[-120,+5]mm · 8 of 31 slices shown, 10 images]
[im 3/31  brain]
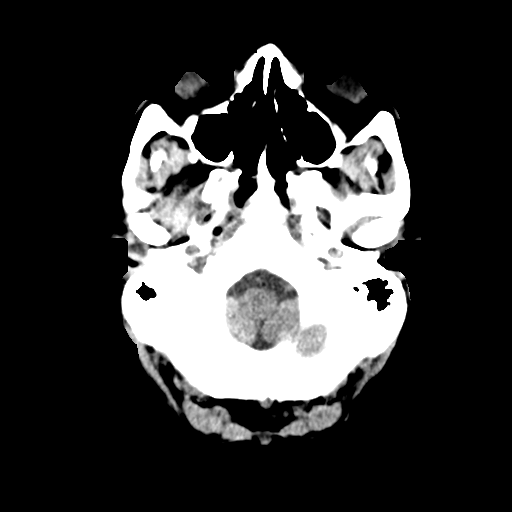
[im 3/31  bone]
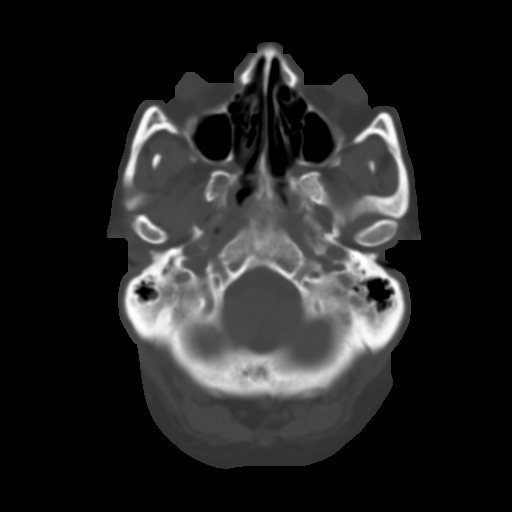
[im 7/31  brain]
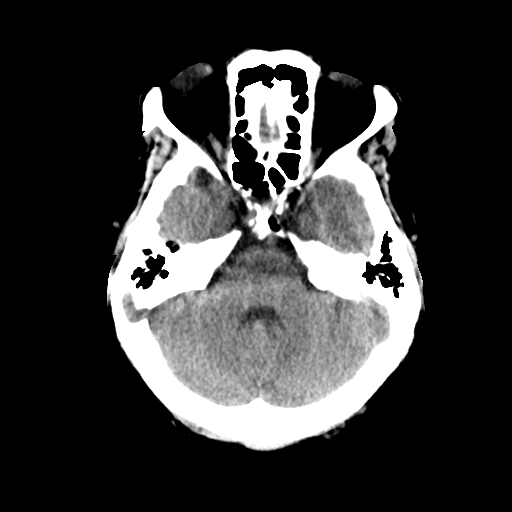
[im 10/31  brain]
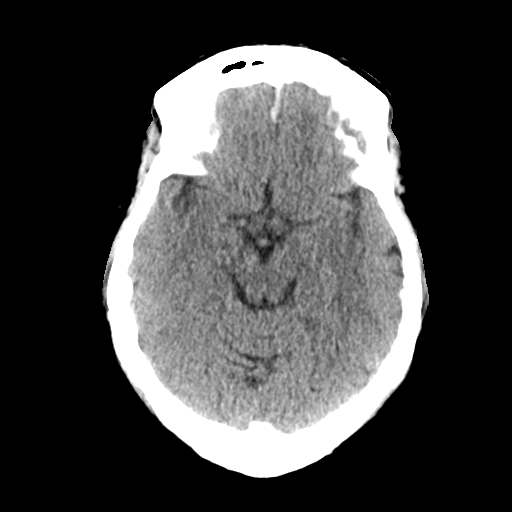
[im 14/31  brain]
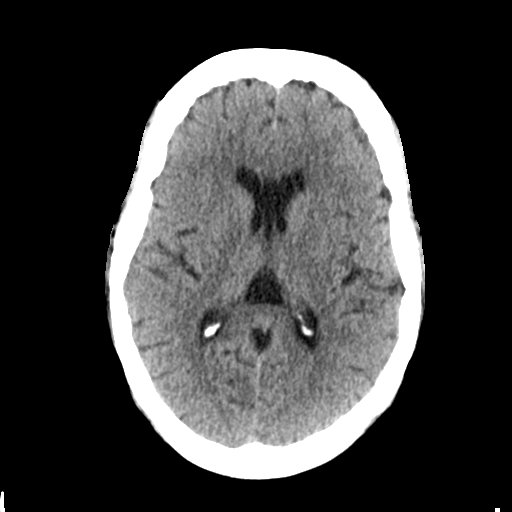
[im 17/31  brain]
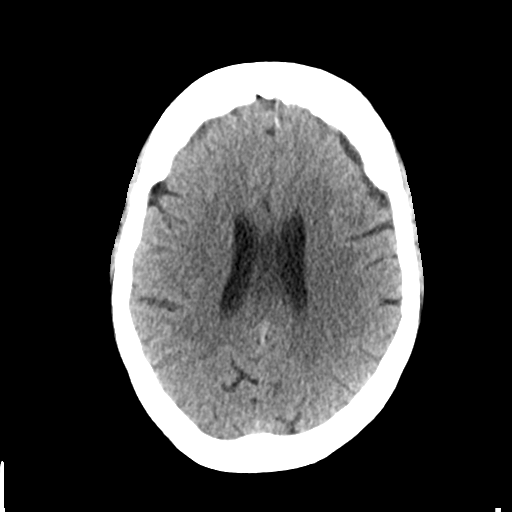
[im 17/31  bone]
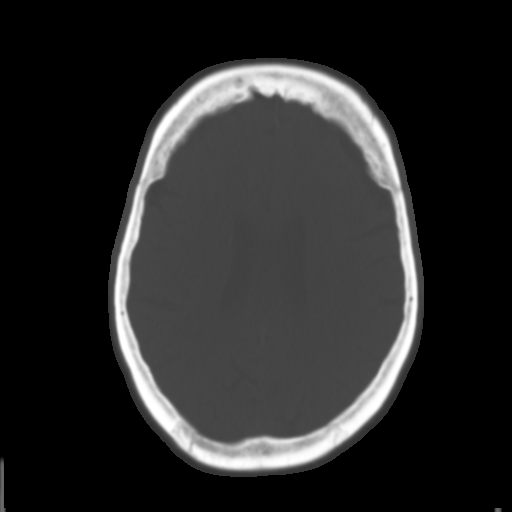
[im 21/31  brain]
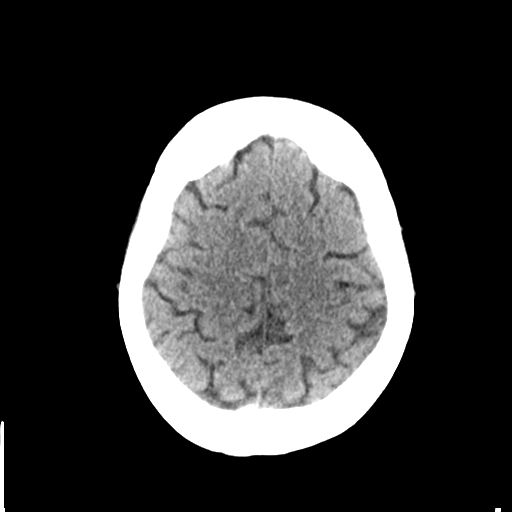
[im 24/31  brain]
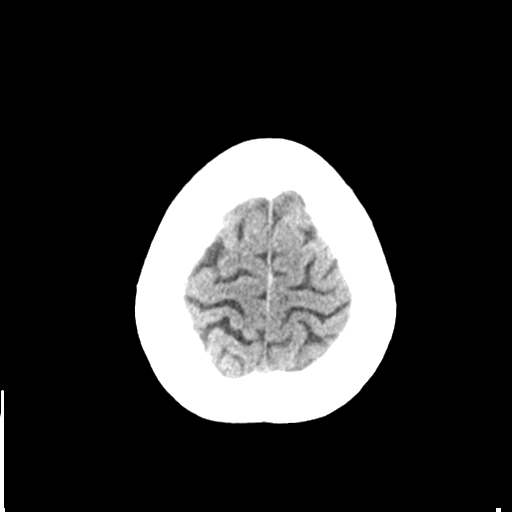
[im 28/31  brain]
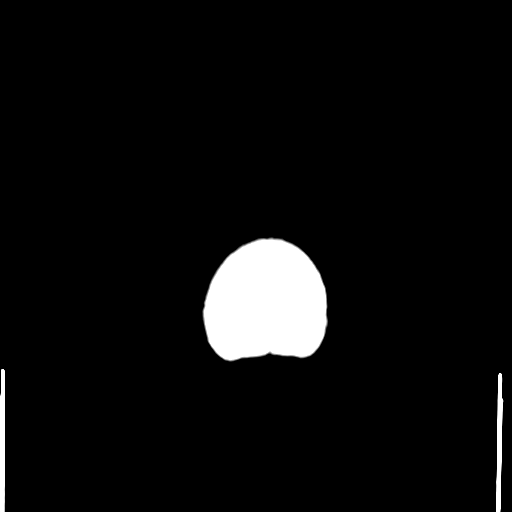

[Series 4: coronal soft tissue · coronal · 0.29mm/px · 3 of 65 slices shown]
[im 22/65  brain]
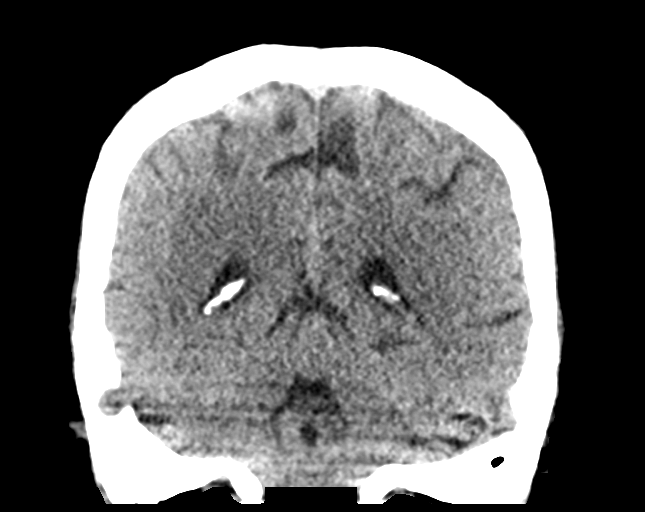
[im 29/65  brain]
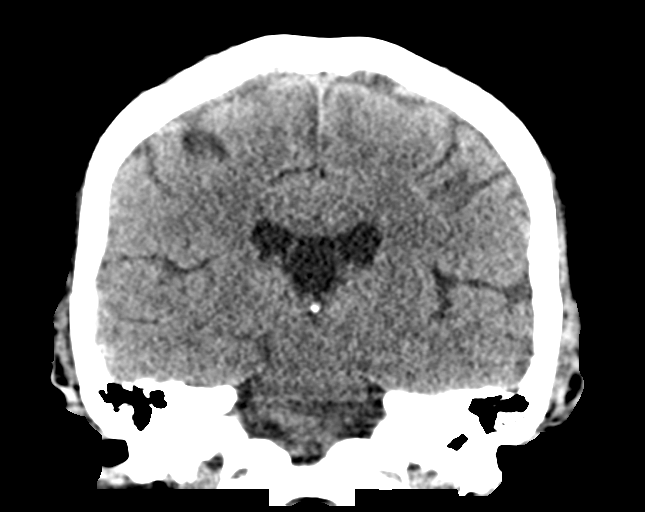
[im 36/65  brain]
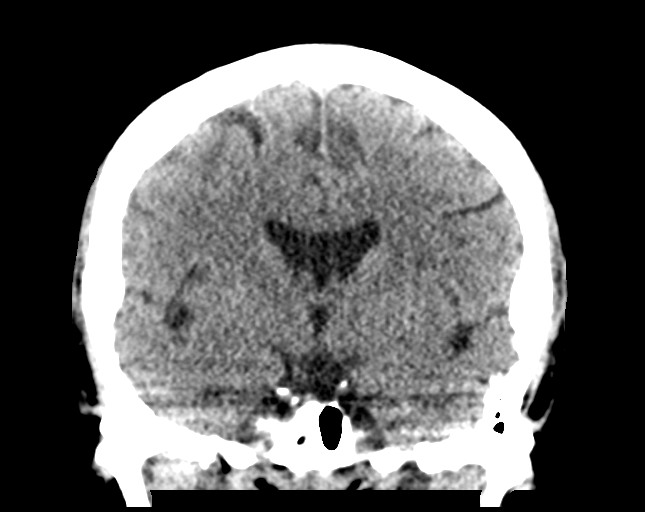

[Series 5: sagittal soft tissue · sagittal · 0.32mm/px · 3 of 51 slices shown]
[im 17/51  brain]
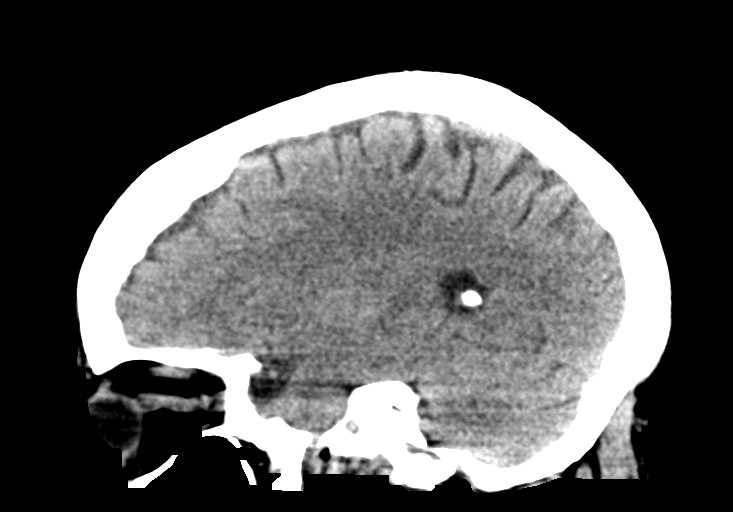
[im 26/51  brain]
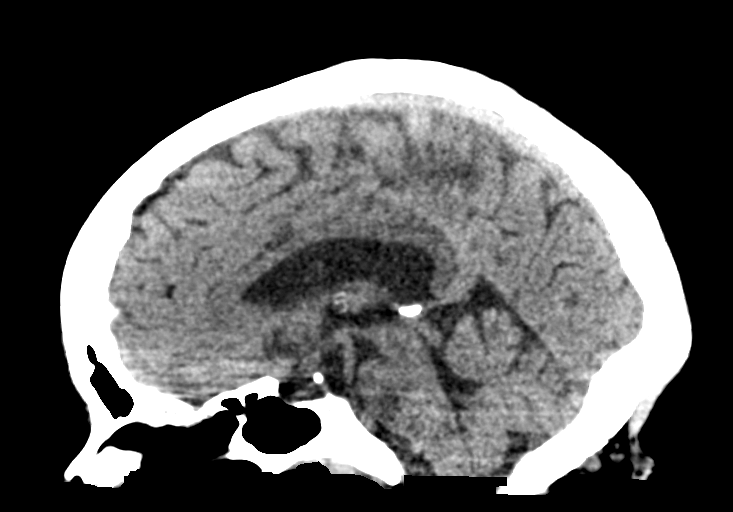
[im 34/51  brain]
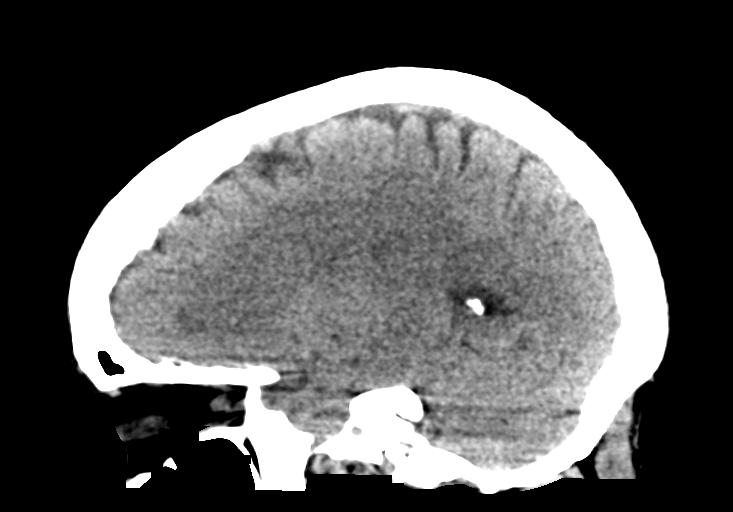

[14 of 47 positions shown; findings below may reference images not displayed]

FINDINGS: Brain: No evidence of acute infarction, hemorrhage, hydrocephalus,
extra-axial collection or mass lesion/mass effect. Cavum septum
pellucidum is noted.

Vascular: No hyperdense vessel or unexpected calcification.

Skull: Normal. Negative for fracture or focal lesion.

Sinuses/Orbits: No acute finding.

Other: None.
IMPRESSION: No acute abnormality noted.

## 2017-09-11 ENCOUNTER — Other Ambulatory Visit: Payer: Self-pay | Admitting: Nurse Practitioner
